# Patient Record
Sex: Male | Born: 1960 | Race: White | Hispanic: No | Smoking: Former smoker
Health system: Southern US, Community
[De-identification: ages and names within clinical notes are randomized; demographics above are authoritative.]

## PROBLEM LIST (undated history)

## (undated) DIAGNOSIS — Z973 Presence of spectacles and contact lenses: Secondary | ICD-10-CM

## (undated) DIAGNOSIS — E669 Obesity, unspecified: Secondary | ICD-10-CM

## (undated) DIAGNOSIS — Z951 Presence of aortocoronary bypass graft: Secondary | ICD-10-CM

## (undated) DIAGNOSIS — K635 Polyp of colon: Secondary | ICD-10-CM

## (undated) DIAGNOSIS — K649 Unspecified hemorrhoids: Secondary | ICD-10-CM

## (undated) DIAGNOSIS — Z7901 Long term (current) use of anticoagulants: Secondary | ICD-10-CM

## (undated) DIAGNOSIS — E1165 Type 2 diabetes mellitus with hyperglycemia: Secondary | ICD-10-CM

## (undated) DIAGNOSIS — M4322 Fusion of spine, cervical region: Secondary | ICD-10-CM

## (undated) DIAGNOSIS — Z981 Arthrodesis status: Secondary | ICD-10-CM

## (undated) DIAGNOSIS — I4510 Unspecified right bundle-branch block: Secondary | ICD-10-CM

## (undated) DIAGNOSIS — M5030 Other cervical disc degeneration, unspecified cervical region: Secondary | ICD-10-CM

## (undated) DIAGNOSIS — I4891 Unspecified atrial fibrillation: Secondary | ICD-10-CM

## (undated) DIAGNOSIS — G459 Transient cerebral ischemic attack, unspecified: Secondary | ICD-10-CM

## (undated) DIAGNOSIS — I484 Atypical atrial flutter: Secondary | ICD-10-CM

## (undated) DIAGNOSIS — E559 Vitamin D deficiency, unspecified: Secondary | ICD-10-CM

## (undated) DIAGNOSIS — G5600 Carpal tunnel syndrome, unspecified upper limb: Secondary | ICD-10-CM

## (undated) DIAGNOSIS — Q790 Congenital diaphragmatic hernia: Secondary | ICD-10-CM

## (undated) DIAGNOSIS — Z87442 Personal history of urinary calculi: Secondary | ICD-10-CM

## (undated) DIAGNOSIS — G4733 Obstructive sleep apnea (adult) (pediatric): Secondary | ICD-10-CM

## (undated) DIAGNOSIS — I2089 Other forms of angina pectoris: Secondary | ICD-10-CM

## (undated) DIAGNOSIS — Z8679 Personal history of other diseases of the circulatory system: Secondary | ICD-10-CM

## (undated) DIAGNOSIS — I2510 Atherosclerotic heart disease of native coronary artery without angina pectoris: Secondary | ICD-10-CM

## (undated) DIAGNOSIS — I10 Essential (primary) hypertension: Secondary | ICD-10-CM

## (undated) DIAGNOSIS — M1990 Unspecified osteoarthritis, unspecified site: Secondary | ICD-10-CM

## (undated) DIAGNOSIS — E119 Type 2 diabetes mellitus without complications: Secondary | ICD-10-CM

## (undated) DIAGNOSIS — Z7902 Long term (current) use of antithrombotics/antiplatelets: Secondary | ICD-10-CM

## (undated) DIAGNOSIS — T884XXA Failed or difficult intubation, initial encounter: Secondary | ICD-10-CM

## (undated) DIAGNOSIS — Z8489 Family history of other specified conditions: Secondary | ICD-10-CM

## (undated) DIAGNOSIS — I4819 Other persistent atrial fibrillation: Secondary | ICD-10-CM

## (undated) DIAGNOSIS — E785 Hyperlipidemia, unspecified: Secondary | ICD-10-CM

## (undated) DIAGNOSIS — N182 Chronic kidney disease, stage 2 (mild): Secondary | ICD-10-CM

## (undated) DIAGNOSIS — K219 Gastro-esophageal reflux disease without esophagitis: Secondary | ICD-10-CM

## (undated) DIAGNOSIS — R0600 Dyspnea, unspecified: Secondary | ICD-10-CM

## (undated) DIAGNOSIS — I219 Acute myocardial infarction, unspecified: Secondary | ICD-10-CM

## (undated) DIAGNOSIS — E1149 Type 2 diabetes mellitus with other diabetic neurological complication: Secondary | ICD-10-CM

## (undated) DIAGNOSIS — Z79899 Other long term (current) drug therapy: Secondary | ICD-10-CM

## (undated) DIAGNOSIS — I700 Atherosclerosis of aorta: Secondary | ICD-10-CM

## (undated) HISTORY — PX: SPINAL FUSION: SHX223

## (undated) HISTORY — DX: Essential (primary) hypertension: I10

## (undated) HISTORY — PX: CARDIAC CATHETERIZATION: SHX172

## (undated) HISTORY — PX: OTHER SURGICAL HISTORY: SHX169

## (undated) HISTORY — DX: Acute myocardial infarction, unspecified: I21.9

## (undated) HISTORY — DX: Atherosclerotic heart disease of native coronary artery without angina pectoris: I25.10

## (undated) HISTORY — PX: CARPAL TUNNEL RELEASE: SHX101

## (undated) HISTORY — DX: Obesity, unspecified: E66.9

## (undated) HISTORY — PX: TONSILLECTOMY: SUR1361

## (undated) HISTORY — DX: Hyperlipidemia, unspecified: E78.5

## (undated) HISTORY — DX: Dyspnea, unspecified: R06.00

---

## 2004-02-27 ENCOUNTER — Other Ambulatory Visit: Payer: Self-pay

## 2004-02-27 ENCOUNTER — Inpatient Hospital Stay: Payer: Self-pay | Admitting: Internal Medicine

## 2005-04-01 DIAGNOSIS — I2510 Atherosclerotic heart disease of native coronary artery without angina pectoris: Secondary | ICD-10-CM

## 2005-04-01 DIAGNOSIS — Z955 Presence of coronary angioplasty implant and graft: Secondary | ICD-10-CM

## 2005-04-01 DIAGNOSIS — I219 Acute myocardial infarction, unspecified: Secondary | ICD-10-CM

## 2005-04-01 HISTORY — DX: Acute myocardial infarction, unspecified: I21.9

## 2005-04-01 HISTORY — DX: Atherosclerotic heart disease of native coronary artery without angina pectoris: I25.10

## 2005-04-01 HISTORY — DX: Presence of coronary angioplasty implant and graft: Z95.5

## 2005-04-28 ENCOUNTER — Other Ambulatory Visit: Payer: Self-pay

## 2005-04-28 ENCOUNTER — Emergency Department: Payer: Self-pay | Admitting: General Practice

## 2005-04-28 DIAGNOSIS — I2119 ST elevation (STEMI) myocardial infarction involving other coronary artery of inferior wall: Secondary | ICD-10-CM

## 2005-04-28 HISTORY — DX: ST elevation (STEMI) myocardial infarction involving other coronary artery of inferior wall: I21.19

## 2005-04-28 HISTORY — PX: CORONARY ANGIOPLASTY WITH STENT PLACEMENT: SHX49

## 2005-05-03 DIAGNOSIS — I252 Old myocardial infarction: Secondary | ICD-10-CM | POA: Insufficient documentation

## 2005-05-28 ENCOUNTER — Encounter: Payer: Self-pay | Admitting: Cardiovascular Disease

## 2005-05-30 ENCOUNTER — Encounter: Payer: Self-pay | Admitting: Cardiovascular Disease

## 2005-06-30 ENCOUNTER — Encounter: Payer: Self-pay | Admitting: Cardiovascular Disease

## 2005-07-30 ENCOUNTER — Encounter: Payer: Self-pay | Admitting: Cardiovascular Disease

## 2005-12-30 DIAGNOSIS — G609 Hereditary and idiopathic neuropathy, unspecified: Secondary | ICD-10-CM | POA: Insufficient documentation

## 2006-02-07 ENCOUNTER — Other Ambulatory Visit: Payer: Self-pay

## 2006-02-14 ENCOUNTER — Ambulatory Visit: Payer: Self-pay | Admitting: Specialist

## 2006-02-20 ENCOUNTER — Inpatient Hospital Stay: Payer: Self-pay | Admitting: Specialist

## 2006-06-19 DIAGNOSIS — E785 Hyperlipidemia, unspecified: Secondary | ICD-10-CM | POA: Insufficient documentation

## 2006-06-19 DIAGNOSIS — I10 Essential (primary) hypertension: Secondary | ICD-10-CM | POA: Insufficient documentation

## 2006-07-10 ENCOUNTER — Other Ambulatory Visit: Payer: Self-pay

## 2006-07-10 ENCOUNTER — Inpatient Hospital Stay: Payer: Self-pay | Admitting: Internal Medicine

## 2006-07-10 HISTORY — PX: CORONARY ANGIOPLASTY WITH STENT PLACEMENT: SHX49

## 2006-08-13 ENCOUNTER — Encounter: Payer: Self-pay | Admitting: Cardiovascular Disease

## 2008-07-28 ENCOUNTER — Ambulatory Visit: Payer: Self-pay | Admitting: Family Medicine

## 2008-08-04 ENCOUNTER — Ambulatory Visit: Payer: Self-pay

## 2008-09-14 ENCOUNTER — Ambulatory Visit (HOSPITAL_COMMUNITY): Admission: RE | Admit: 2008-09-14 | Discharge: 2008-09-15 | Payer: Self-pay | Admitting: Neurosurgery

## 2008-09-14 HISTORY — PX: ANTERIOR FUSION CERVICAL SPINE: SUR626

## 2008-11-02 ENCOUNTER — Encounter: Admission: RE | Admit: 2008-11-02 | Discharge: 2008-11-02 | Payer: Self-pay | Admitting: Neurosurgery

## 2008-12-29 DIAGNOSIS — F19939 Other psychoactive substance use, unspecified with withdrawal, unspecified: Secondary | ICD-10-CM | POA: Insufficient documentation

## 2008-12-29 DIAGNOSIS — F19239 Other psychoactive substance dependence with withdrawal, unspecified: Secondary | ICD-10-CM | POA: Insufficient documentation

## 2009-11-27 ENCOUNTER — Ambulatory Visit: Payer: Self-pay | Admitting: Cardiovascular Disease

## 2009-11-27 HISTORY — PX: CORONARY ANGIOPLASTY WITH STENT PLACEMENT: SHX49

## 2010-07-09 LAB — DIFFERENTIAL
Basophils Absolute: 0 10*3/uL (ref 0.0–0.1)
Basophils Relative: 0 % (ref 0–1)
Lymphocytes Relative: 30 % (ref 12–46)
Monocytes Absolute: 0.6 10*3/uL (ref 0.1–1.0)
Monocytes Relative: 7 % (ref 3–12)
Neutro Abs: 4.9 10*3/uL (ref 1.7–7.7)
Neutrophils Relative %: 62 % (ref 43–77)

## 2010-07-09 LAB — COMPREHENSIVE METABOLIC PANEL
ALT: 23 U/L (ref 0–53)
Calcium: 9.2 mg/dL (ref 8.4–10.5)
GFR calc Af Amer: >60 mL/min (ref >60)
Glucose, Bld: 90 mg/dL (ref 70–99)
Sodium: 139 mEq/L (ref 135–145)
Total Bilirubin: 0.6 mg/dL (ref 0.3–1.2)
Total Protein: 6.8 g/dL (ref 6.0–8.3)

## 2010-07-09 LAB — CBC
Hemoglobin: 13.6 g/dL (ref 13.0–17.0)
MCHC: 32.8 g/dL (ref 30.0–36.0)
Platelets: 251 10*3/uL (ref 150–400)
RDW: 13.3 % (ref 11.5–15.5)

## 2010-07-09 LAB — TYPE AND SCREEN
ABO/RH(D): O NEG
Antibody Screen: NEGATIVE

## 2010-07-09 LAB — PROTIME-INR: Prothrombin Time: 12.8 seconds (ref 11.6–15.2)

## 2010-08-14 NOTE — Op Note (Signed)
NAME:  Nathan Hull, PACHOLSKI               ACCOUNT NO.:  [id number]   MEDICAL RECORD NO.:  [id number]          PATIENT TYPE:  OIB   LOCATION:  3534                         FACILITY:  [facility]   PHYSICIAN:  Kathaleen Maser. Pool, M.D.    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   PROCEDURE NAME:  C6-7 anterior cervical diskectomy and fusion,  inadvertent C5-6 anterior cervical diskectomy and fusion with C5 through  C7 anterior plate instrumentation.   SURGEON:  Kathaleen Maser. Pool, MD   ASSISTANT:  Tia Alert, MD   ANESTHESIA:  General endotracheal.   INDICATIONS:  Nathan Hull is a 50 year old male with history of severe  neck and right upper extremity pain, paresthesias and weaknesses history  with a right-sided C7 radiculopathy failing all conservative measure.  Workup demonstrates evidence of spondylosis with associated disk  herniation of the right-sided C6-7.  The patient has been counseled as  to his options and has decided to proceed with C6-7 anterior cervical  diskectomy and fusion with allograft and plating.   OPERATIVE NOTE:  The patient was taken to the operating room and placed  on the table in supine position.  After adequate level of anesthesia was  achieved, the patient was positioned supine with neck slightly extended,  held in place with 10-pounds Holter traction.  The patient's shoulders  taped down to provide better visualization to the cervical spine on x-  ray guidance.  All pressure points were appropriately padded.  The  patient's anterior neck was prepped and draped sterilely.  A 10-blade  was used make a linear skin incision overlying, what was felt to be the  C6-7 interspace.  This was carried down sharply to the  platysma.  The  platysma was then divided vertically and dissection proceeded to the  medial border of the sternomastoid muscle and carotid sheath.  Trachea  and esophagus were mobilized and retracted towards the left.  Prevertebral fascia stripped off the anterior spinal column.  Longus  coli muscle elevated bilaterally using electrocautery.  Deep self-  retaining retractor was placed.  Intraoperative fluoroscopy was used to  localize the proper level.  Localization was difficult secondary to the  patient's very large body habitus and very bulky neck.  The C5-6 level  was marked and confirmed under fluoroscopic guidance.  I redirected the  dissection one level caudally.  I incised the annulus while still  visualizing what I thought was the annulus of C5-6.  I then performed a  diskectomy by removing the disk using pituitary rongeurs, forward and  backward Karlin curettes, Kerrison rongeurs, high-speed drill all the  way down to the posterior edge.  Microscope was then brought into the  field and used throughout the remainder of diskectomy.  The remaining  aspects of the annulus and osteophytes were removed using the high-speed  drill down to the level  of the posterior longitudinal ligament.  The  posterior longitudinal ligament was then elevated and resected in  piecemeal fashion using Kerrison rongeurs.  The underlying thecal sac  was identified.  Wide central decompression was then performed  undercutting the bodies of C5 and C6.  Decompression was then proceeded  out to the edges of the neural foramen.  Wide anterior foraminotomies  were then performed along the course of the exiting C6 nerve roots  bilaterally.  There did appear to be some element of spondylosis and  disk herniation of the right-sided C6-7.  The disk space was sized and  an 8-mm Cornerstone wedge was then packed into place and recessed  approximately 1 mm from the anterior cortical margin.  A 25-mm Atlantis  anterior  plate was then placed over the C6 and C7 levels.  This was then  attached under fluoroscopic guidance and found to be solid within the  bone.  Locking screws were engaged.  Final images revealed that in fact  the operative level was in fact C5-C6 and C6-7 has been assumed based on  previous marking and dissection.  The plate instrumentation in C5-6 was  removed.  Dissection was redirected one level caudally.  Deep self-  retaining retractor was replaced.  A diskectomy of C6-7 was then  performed in a similar fashion by first removing the disk down to the  posterior annulus.  Microscope was then brought into the field.  Again,  remaining annulus was then removed using high-speed drill down the level  of posterior longitudinal ligament.  The posterior longitudinal ligament  was then elevated and resected in piecemeal using Kerrison rongeurs.  The underlying thecal sac was then identified.  Wide central  decompression was then performed undercutting the bodies of C6 and C7.  Decompression was then proceeded out to the edges of the neural foramen.  Wide anterior foraminotomies were performed along the course of exiting  C7 nerve roots bilaterally.  Wound was then copiously irrigated with  antibiotic solution.  A 9-mm Cornerstone wedge was then packed into  place, and recessed roughly 1 mm from the anterior cortical margin.  A  47-mm Atlantis anterior cervical plate was then placed over the C5, C6,  C7 levels.  This was then attached under fluoroscopic guidance using a  13-mm variable angle screws, 2 each at all 3 levels.  All 6 screws were  given final tightening and found to be solid bone.  The locking screws  were engaged at both levels.  Final images revealed good position of  bone grafts, hardware at proper operative level, and normal alignment of  spine.  The wound was then irrigated.  Hemostasis was ensured with the  bipolar electrocautery.  The wound was then closed in typical fashion.   The patient tolerated the procedure well and returned to the recovery  room postoperatively.           ______________________________  Kathaleen Maser Pool, M.D.     HAP/MEDQ  D:  09/14/2008  T:  09/15/2008  Job:  [id number]

## 2011-08-04 ENCOUNTER — Emergency Department: Payer: Self-pay | Admitting: Emergency Medicine

## 2011-08-08 ENCOUNTER — Emergency Department: Payer: Self-pay | Admitting: *Deleted

## 2011-11-13 ENCOUNTER — Ambulatory Visit: Payer: Self-pay | Admitting: Family Medicine

## 2012-07-01 ENCOUNTER — Other Ambulatory Visit: Payer: Self-pay | Admitting: Neurosurgery

## 2012-07-01 DIAGNOSIS — M5030 Other cervical disc degeneration, unspecified cervical region: Secondary | ICD-10-CM

## 2012-07-01 DIAGNOSIS — M5412 Radiculopathy, cervical region: Secondary | ICD-10-CM

## 2012-07-01 DIAGNOSIS — M47812 Spondylosis without myelopathy or radiculopathy, cervical region: Secondary | ICD-10-CM

## 2012-07-22 ENCOUNTER — Ambulatory Visit
Admission: RE | Admit: 2012-07-22 | Discharge: 2012-07-22 | Disposition: A | Discharge status: Home / Self Care | Payer: BC Managed Care – PPO | Source: Ambulatory Visit | Attending: Neurosurgery | Admitting: Neurosurgery

## 2012-07-22 DIAGNOSIS — M47812 Spondylosis without myelopathy or radiculopathy, cervical region: Secondary | ICD-10-CM

## 2012-07-22 DIAGNOSIS — M5412 Radiculopathy, cervical region: Secondary | ICD-10-CM

## 2012-07-22 DIAGNOSIS — M5030 Other cervical disc degeneration, unspecified cervical region: Secondary | ICD-10-CM

## 2013-09-16 ENCOUNTER — Ambulatory Visit: Payer: Self-pay | Admitting: Cardiovascular Disease

## 2013-09-20 ENCOUNTER — Encounter: Payer: BC Managed Care – PPO | Admitting: Cardiothoracic Surgery

## 2013-09-20 ENCOUNTER — Other Ambulatory Visit: Payer: Self-pay

## 2013-09-20 DIAGNOSIS — I25119 Atherosclerotic heart disease of native coronary artery with unspecified angina pectoris: Secondary | ICD-10-CM | POA: Insufficient documentation

## 2013-09-20 DIAGNOSIS — I2510 Atherosclerotic heart disease of native coronary artery without angina pectoris: Secondary | ICD-10-CM | POA: Insufficient documentation

## 2013-09-21 ENCOUNTER — Encounter: Payer: Self-pay | Admitting: Cardiothoracic Surgery

## 2013-09-21 ENCOUNTER — Encounter: Payer: Self-pay | Admitting: *Deleted

## 2013-09-21 ENCOUNTER — Other Ambulatory Visit: Payer: Self-pay | Admitting: *Deleted

## 2013-09-21 ENCOUNTER — Institutional Professional Consult (permissible substitution) (INDEPENDENT_AMBULATORY_CARE_PROVIDER_SITE_OTHER): Payer: BC Managed Care – PPO | Admitting: Cardiothoracic Surgery

## 2013-09-21 ENCOUNTER — Encounter (HOSPITAL_COMMUNITY): Payer: Self-pay | Admitting: Pharmacy Technician

## 2013-09-21 VITALS — BP 134/82 | HR 54 | Resp 16 | Ht 72.0 in | Wt 295.0 lb

## 2013-09-21 DIAGNOSIS — I2584 Coronary atherosclerosis due to calcified coronary lesion: Secondary | ICD-10-CM

## 2013-09-21 DIAGNOSIS — I25119 Atherosclerotic heart disease of native coronary artery with unspecified angina pectoris: Secondary | ICD-10-CM

## 2013-09-21 DIAGNOSIS — I2510 Atherosclerotic heart disease of native coronary artery without angina pectoris: Secondary | ICD-10-CM

## 2013-09-21 NOTE — Progress Notes (Signed)
PCP is Carmon Ginsberg, MD Referring Provider is Dionisio David, MD  Chief Complaint  Patient presents with  . Coronary Artery Disease    eval for CABG...cathed at [facility] 09/16/13    HPI: Patient examined, most recent coronary angiogram reviewed  53 year old obese Caucasian nondiabetic reformed smoker presents with symptoms of progressive angina mainly with exertion but occasionally at rest. He has a long history of coronary disease. He presented in 2007 with an inferior MI at [facility] and had a PCI with a stent to RCA. In 2011 he had further PCI to the RCA as well as the diagonal. He did well until recently when he developed recurrent angina with upper arm and chest pressure with exertion and sometimes at rest. A myocardial perfusion scan was abnormal. Cardiac catheterization was performed 5 days ago at [facility] by Dr. Yancey Flemings which demonstrated a ostial left main stenosis, distal RCA stenosis and a a small nondominant circumflex. A ramus intermediate had moderate to proximal disease. EF by ventriculogram appeared to be well preserved. Echocardiogram is pending as part of the preop evaluation.  Patient stopped his Plavix on the day of catheterization. He did not have any bleeding from the groin. 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  Patient surgical historyIs positive for anterior C-spine surgery by Dr. Trenton Gammon at [facility] hospital 2 years ago without anesthetic problems or bleeding problems.  The patient denies any other vascular complaints including claudication DVT TIA or varicose veins  Past Medical History  Diagnosis Date  . CAD (coronary artery disease)   . Dyspnea   . Hypertension   . Hyperlipidemia   . Obesity   . Myocardial infarct     Past Surgical History  Procedure Laterality Date  . Ptca at [facility]      04/28/2005  . C-spine disc replacement x 2      06/16/2012    Family History  Problem Relation Age of Onset  . Hypertension Mother    . Hypertension Father   . Rheumatic fever Mother     S/P MVR    Social History History  Substance Use Topics  . Smoking status: Former Smoker -- 2.00 packs/day    Types: Cigarettes    Quit date: 04/23/2005  . Smokeless tobacco: Never Used  . Alcohol Use: No    Current Outpatient Prescriptions  Medication Sig Dispense Refill  . aspirin EC 81 MG tablet Take 81 mg by mouth daily.      Marland Kitchen atorvastatin (LIPITOR) 80 MG tablet Take 80 mg by mouth daily.       Marland Kitchen lisinopril (PRINIVIL,ZESTRIL) 10 MG tablet Take 10 mg by mouth daily.       . metoprolol (LOPRESSOR) 50 MG tablet Take 50 mg by mouth 2 (two) times daily.       . ranolazine (RANEXA) 500 MG 12 hr tablet Take 500 mg by mouth 2 (two) times daily.       No current facility-administered medications for this visit.    No Known Allergies  Review of Systems  BP 134/82  Pulse 54  Resp 16  Ht 6' (1.829 m)  Wt 295 lb (133.811 kg)  BMI 40.00 kg/m2  SpO2 97% Physical Exam Gen.-obese middle-aged male anxious but in no acute distress HEENT normocephalic dentition good pupils equal Neck without JVD carotid bruit or mass Thorax without deformity or tenderness, breath sounds clear and equal Cardiac with regular rhythm no murmur or gallop  Abdomen obese soft nontender without pulsatile mass Extremities without clubbing cyanosis edema or tenderness Vascular pedal pulses nonpalpable, no evidence of venous insufficiency of the lower extremities Neuro right-hand dominant no focal motor deficit, normal gait  Diagnostic Tests: Coronary tear grams demonstrate left main ostial stenosis, moderate distal RCA stenosis and preserved LV function He appears to have adequate targets for grafting and the LAD distal RCA and ramus intermediate. Circumflex is small and nondominant  Impression: Left main stenosis with class4-accelerating unstable angina We'll schedule for multivessel CABG this week after Plavix washout  Plan: Multivessel CABG on  June 26 at [facility]. Procedure indications risks discussed with patient and his family. The patient lives alone but immediately adjacent to his parents who will help with his care after returns home following surgery

## 2013-09-22 ENCOUNTER — Ambulatory Visit (HOSPITAL_COMMUNITY)
Admission: RE | Admit: 2013-09-22 | Discharge: 2013-09-22 | Disposition: A | Discharge status: Home / Self Care | Payer: BC Managed Care – PPO | Source: Ambulatory Visit | Attending: Cardiothoracic Surgery | Admitting: Cardiothoracic Surgery

## 2013-09-22 ENCOUNTER — Encounter (HOSPITAL_COMMUNITY)
Admission: RE | Admit: 2013-09-22 | Discharge: 2013-09-22 | Disposition: A | Discharge status: Home / Self Care | Payer: BC Managed Care – PPO | Source: Ambulatory Visit | Attending: Cardiothoracic Surgery | Admitting: Cardiothoracic Surgery

## 2013-09-22 ENCOUNTER — Ambulatory Visit (HOSPITAL_COMMUNITY)
Admission: RE | Admit: 2013-09-22 | Discharge: 2013-09-22 | Disposition: A | Discharge status: Home / Self Care | Payer: BC Managed Care – PPO | Source: Ambulatory Visit | Attending: Family Medicine | Admitting: Family Medicine

## 2013-09-22 ENCOUNTER — Encounter (HOSPITAL_COMMUNITY): Payer: Self-pay

## 2013-09-22 VITALS — BP 123/75 | HR 59 | Temp 97.7°F | Resp 18 | Ht 72.0 in | Wt 298.0 lb

## 2013-09-22 DIAGNOSIS — I209 Angina pectoris, unspecified: Secondary | ICD-10-CM | POA: Insufficient documentation

## 2013-09-22 DIAGNOSIS — I25119 Atherosclerotic heart disease of native coronary artery with unspecified angina pectoris: Secondary | ICD-10-CM

## 2013-09-22 DIAGNOSIS — I2510 Atherosclerotic heart disease of native coronary artery without angina pectoris: Secondary | ICD-10-CM | POA: Insufficient documentation

## 2013-09-22 DIAGNOSIS — Z01810 Encounter for preprocedural cardiovascular examination: Secondary | ICD-10-CM

## 2013-09-22 DIAGNOSIS — I517 Cardiomegaly: Secondary | ICD-10-CM

## 2013-09-22 HISTORY — DX: Family history of other specified conditions: Z84.89

## 2013-09-22 LAB — APTT: aPTT: 33 seconds (ref 24–37)

## 2013-09-22 LAB — CBC
HCT: 41.7 % (ref 39.0–52.0)
Hemoglobin: 13.6 g/dL (ref 13.0–17.0)
MCH: 28.6 pg (ref 26.0–34.0)
MCHC: 32.6 g/dL (ref 30.0–36.0)
MCV: 87.8 fL (ref 78.0–100.0)
Platelets: 261 10*3/uL (ref 150–400)
RBC: 4.75 MIL/uL (ref 4.22–5.81)
RDW: 13 % (ref 11.5–15.5)
WBC: 8.2 10*3/uL (ref 4.0–10.5)

## 2013-09-22 LAB — TYPE AND SCREEN
ABO/RH(D): O NEG
Antibody Screen: NEGATIVE

## 2013-09-22 LAB — URINALYSIS, ROUTINE W REFLEX MICROSCOPIC
Bilirubin Urine: NEGATIVE
Glucose, UA: NEGATIVE mg/dL
Hgb urine dipstick: NEGATIVE
Ketones, ur: NEGATIVE mg/dL
Leukocytes, UA: NEGATIVE
Nitrite: NEGATIVE
Protein, ur: NEGATIVE mg/dL
Specific Gravity, Urine: 1.022 (ref 1.005–1.030)
Urobilinogen, UA: 0.2 mg/dL (ref 0.0–1.0)
pH: 6.5 (ref 5.0–8.0)

## 2013-09-22 LAB — HEMOGLOBIN A1C
Hgb A1c MFr Bld: 6.6 % — ABNORMAL HIGH (ref <5.7)
Mean Plasma Glucose: 143 mg/dL — ABNORMAL HIGH (ref <117)

## 2013-09-22 LAB — COMPREHENSIVE METABOLIC PANEL
ALT: 20 U/L (ref 0–53)
AST: 16 U/L (ref 0–37)
Albumin: 3.8 g/dL (ref 3.5–5.2)
Alkaline Phosphatase: 87 U/L (ref 39–117)
BUN: 16 mg/dL (ref 6–23)
CO2: 23 mEq/L (ref 19–32)
Calcium: 9.7 mg/dL (ref 8.4–10.5)
Chloride: 102 mEq/L (ref 96–112)
Creatinine, Ser: 0.91 mg/dL (ref 0.50–1.35)
GFR calc Af Amer: >90 mL/min (ref >90)
GFR calc non Af Amer: >90 mL/min (ref >90)
Glucose, Bld: 131 mg/dL — ABNORMAL HIGH (ref 70–99)
Potassium: 4.4 mEq/L (ref 3.7–5.3)
Sodium: 140 mEq/L (ref 137–147)
Total Bilirubin: 0.3 mg/dL (ref 0.3–1.2)
Total Protein: 7 g/dL (ref 6.0–8.3)

## 2013-09-22 LAB — BLOOD GAS, ARTERIAL
Acid-Base Excess: 1.8 mmol/L (ref 0.0–2.0)
Bicarbonate: 25.9 mEq/L — ABNORMAL HIGH (ref 20.0–24.0)
Drawn by: 344381
FIO2: 0.21 %
O2 Saturation: 97.4 %
Patient temperature: 98.6
TCO2: 27.2 mmol/L (ref 0–100)
pCO2 arterial: 41.1 mmHg (ref 35.0–45.0)
pH, Arterial: 7.416 (ref 7.350–7.450)
pO2, Arterial: 95.2 mmHg (ref 80.0–100.0)

## 2013-09-22 LAB — PROTIME-INR
INR: 1.08 (ref 0.00–1.49)
Prothrombin Time: 14 seconds (ref 11.6–15.2)

## 2013-09-22 LAB — SURGICAL PCR SCREEN
MRSA, PCR: NEGATIVE
Staphylococcus aureus: NEGATIVE

## 2013-09-22 MED ORDER — ALBUTEROL SULFATE (2.5 MG/3ML) 0.083% IN NEBU
2.5000 mg | INHALATION_SOLUTION | Freq: Once | RESPIRATORY_TRACT | Status: AC
  Administered 2013-09-22: 2.5 mg via RESPIRATORY_TRACT

## 2013-09-22 NOTE — Progress Notes (Addendum)
Had PFT's this AM.  Now going to Doppler studies and Echo.  DA Requested records from Dr. Laurelyn Sickle @ [facility]  [phone]

## 2013-09-22 NOTE — Progress Notes (Signed)
09/22/13 0911  OBSTRUCTIVE SLEEP APNEA  Have you ever been diagnosed with sleep apnea through a sleep study? No  Do you snore loudly (loud enough to be heard through closed doors)?  0  Do you often feel tired, fatigued, or sleepy during the daytime? 0  Has anyone observed you stop breathing during your sleep? 0  Do you have, or are you being treated for high blood pressure? 1  BMI more than 35 kg/m2? 1  Age over 51 years old? 1  Neck circumference greater than 40 cm/16 inches? 1  Gender: 1  Obstructive Sleep Apnea Score 5  Score 4 or greater  Results sent to PCP

## 2013-09-22 NOTE — Progress Notes (Signed)
VASCULAR LAB PRELIMINARY  PRELIMINARY  PRELIMINARY  PRELIMINARY  Pre-op Cardiac Surgery  Carotid Findings:  Bilateral:  1-39% ICA stenosis.  Vertebral artery flow is antegrade.      Upper Extremity Right Left  Brachial Pressures 131 Triphasic  131 Triphasic   Radial Waveforms Triphasic  Triphasic   Ulnar Waveforms Triphasic  Triphasic   Palmar Arch (Allen's Test) Within normal limits  Within normal limits        CESTONE, HELENE, RVT 09/22/2013, 11:07 AM

## 2013-09-22 NOTE — Pre-Procedure Instructions (Signed)
Nathan Hull  09/22/2013   Your procedure is scheduled on:  Friday, June 26th   Report to [facility] Admitting at 5:30 AM.   Call this number if you have problems the morning of surgery: [phone]   Remember:   Do not eat food or drink liquids after midnight Thursday.    Take these medicines the morning of surgery with A SIP OF WATER: Metoprolol   Do not wear jewelry - no rings or watches.  Do not wear lotions or colognes. You may NOT wear deodorant.   Men may shave face and neck.   Do not bring valuables to the hospital.  [facility] is not responsible for any belongings or valuables.               Contacts, dentures or bridgework may not be worn into surgery.  Leave suitcase in the car. After surgery it may be brought to your room.  For patients admitted to the hospital, discharge time is determined by your treatment team.    Name and phone number of your driver:    Special Instructions: "Preparing for Surgery" instruction sheet.   Please read over the following fact sheets that you were given: Pain Booklet, Coughing and Deep Breathing, Blood Transfusion Information, MRSA Information and Surgical Site Infection Prevention

## 2013-09-23 ENCOUNTER — Encounter (HOSPITAL_COMMUNITY): Payer: Self-pay | Admitting: Certified Registered Nurse Anesthetist

## 2013-09-23 MED ORDER — DEXTROSE 5 % IV SOLN
1.5000 g | INTRAVENOUS | Status: AC
  Administered 2013-09-24: .75 g via INTRAVENOUS
  Administered 2013-09-24: 1.5 g via INTRAVENOUS
  Filled 2013-09-23: qty 1.5

## 2013-09-23 MED ORDER — CEFUROXIME SODIUM 750 MG IJ SOLR
750.0000 mg | INTRAMUSCULAR | Status: DC
  Filled 2013-09-23: qty 750

## 2013-09-23 MED ORDER — DOPAMINE-DEXTROSE 3.2-5 MG/ML-% IV SOLN
2.0000 ug/kg/min | INTRAVENOUS | Status: DC
  Filled 2013-09-23: qty 250

## 2013-09-23 MED ORDER — MAGNESIUM SULFATE 50 % IJ SOLN
40.0000 meq | INTRAMUSCULAR | Status: DC
Start: 2013-09-24 — End: 2013-09-24
  Filled 2013-09-23: qty 10

## 2013-09-23 MED ORDER — NITROGLYCERIN IN D5W 200-5 MCG/ML-% IV SOLN
2.0000 ug/min | INTRAVENOUS | Status: DC
  Filled 2013-09-23: qty 250

## 2013-09-23 MED ORDER — DEXMEDETOMIDINE HCL IN NACL 400 MCG/100ML IV SOLN
0.1000 ug/kg/h | INTRAVENOUS | Status: DC
  Filled 2013-09-23: qty 100

## 2013-09-23 MED ORDER — METOPROLOL TARTRATE 12.5 MG HALF TABLET: 12.5000 mg | ORAL_TABLET | Freq: Once | ORAL | Status: DC

## 2013-09-23 MED ORDER — POTASSIUM CHLORIDE 2 MEQ/ML IV SOLN
80.0000 meq | INTRAVENOUS | Status: DC
Start: 2013-09-24 — End: 2013-09-24
  Filled 2013-09-23: qty 40

## 2013-09-23 MED ORDER — PHENYLEPHRINE HCL 10 MG/ML IJ SOLN
30.0000 ug/min | INTRAVENOUS | Status: DC
  Filled 2013-09-23: qty 2

## 2013-09-23 MED ORDER — PLASMA-LYTE 148 IV SOLN
INTRAVENOUS | Status: AC
  Administered 2013-09-24: 07:00:00
  Filled 2013-09-23: qty 2.5

## 2013-09-23 MED ORDER — SODIUM CHLORIDE 0.9 % IV SOLN
INTRAVENOUS | Status: DC
  Filled 2013-09-23 (×2): qty 40

## 2013-09-23 MED ORDER — INSULIN REGULAR HUMAN 100 UNIT/ML IJ SOLN
INTRAMUSCULAR | Status: DC
  Filled 2013-09-23: qty 1

## 2013-09-23 MED ORDER — SODIUM CHLORIDE 0.9 % IV SOLN
INTRAVENOUS | Status: DC
  Filled 2013-09-23: qty 30

## 2013-09-23 MED ORDER — VANCOMYCIN HCL 10 G IV SOLR
1500.0000 mg | INTRAVENOUS | Status: AC
  Administered 2013-09-24: 1500 mg via INTRAVENOUS
  Filled 2013-09-23 (×2): qty 1500

## 2013-09-23 MED ORDER — EPINEPHRINE HCL 1 MG/ML IJ SOLN
0.5000 ug/min | INTRAVENOUS | Status: DC
  Filled 2013-09-23: qty 4

## 2013-09-23 NOTE — Anesthesia Preprocedure Evaluation (Addendum)
Anesthesia Evaluation  Patient identified by MRN, date of birth, ID band Patient awake    Reviewed: Allergy & Precautions, H&P , NPO status , Patient's Chart, lab work & pertinent test results, reviewed documented beta blocker date and time   History of Anesthesia Complications Negative for: history of anesthetic complications  Airway Mallampati: III TM Distance: >3 FB Neck ROM: Full    Dental  (+) Teeth Intact, Dental Advisory Given, Chipped   Pulmonary shortness of breath, former smoker (quit '07),  breath sounds clear to auscultation        Cardiovascular hypertension, Pt. on medications and Pt. on home beta blockers + CAD (L main disease) and + Past MI Rhythm:Regular Rate:Normal  ECHO: EF 60-65%, valves OK   Neuro/Psych negative neurological ROS     GI/Hepatic negative GI ROS, Neg liver ROS,   Endo/Other  Morbid obesity  Renal/GU negative Renal ROS     Musculoskeletal   Abdominal (+) + obese,   Peds  Hematology   Anesthesia Other Findings   Reproductive/Obstetrics                         Anesthesia Physical Anesthesia Plan  ASA: III  Anesthesia Plan: General   Post-op Pain Management:    Induction: Intravenous  Airway Management Planned: Oral ETT  Additional Equipment: Arterial line, PA Cath, CVP, 3D TEE and Ultrasound Guidance Line Placement  Intra-op Plan:   Post-operative Plan: Post-operative intubation/ventilation  Informed Consent: I have reviewed the patients History and Physical, chart, labs and discussed the procedure including the risks, benefits and alternatives for the proposed anesthesia with the patient or authorized representative who has indicated his/her understanding and acceptance.   Dental advisory given  Plan Discussed with: CRNA and Surgeon  Anesthesia Plan Comments: (Plan routine monitors, A line, PA cath, GETA with TEE and post op ventilation)         Anesthesia Quick Evaluation

## 2013-09-24 ENCOUNTER — Inpatient Hospital Stay (HOSPITAL_COMMUNITY)
Admission: RE | Admit: 2013-09-24 | Discharge: 2013-10-01 | DRG: 236 | Disposition: A | Discharge status: Home / Self Care | Payer: BC Managed Care – PPO | Source: Ambulatory Visit | Attending: Cardiothoracic Surgery | Admitting: Cardiothoracic Surgery

## 2013-09-24 ENCOUNTER — Inpatient Hospital Stay (HOSPITAL_COMMUNITY): Payer: BC Managed Care – PPO | Admitting: Certified Registered"

## 2013-09-24 ENCOUNTER — Encounter (HOSPITAL_COMMUNITY): Payer: Self-pay | Admitting: *Deleted

## 2013-09-24 ENCOUNTER — Encounter (HOSPITAL_COMMUNITY)
Admission: RE | Disposition: A | Discharge status: Home / Self Care | Payer: BC Managed Care – PPO | Source: Ambulatory Visit | Attending: Cardiothoracic Surgery

## 2013-09-24 ENCOUNTER — Encounter (HOSPITAL_COMMUNITY): Payer: BC Managed Care – PPO | Admitting: Certified Registered"

## 2013-09-24 ENCOUNTER — Inpatient Hospital Stay (HOSPITAL_COMMUNITY): Payer: BC Managed Care – PPO

## 2013-09-24 DIAGNOSIS — Z951 Presence of aortocoronary bypass graft: Secondary | ICD-10-CM

## 2013-09-24 DIAGNOSIS — I252 Old myocardial infarction: Secondary | ICD-10-CM

## 2013-09-24 DIAGNOSIS — E119 Type 2 diabetes mellitus without complications: Secondary | ICD-10-CM | POA: Diagnosis present

## 2013-09-24 DIAGNOSIS — K5900 Constipation, unspecified: Secondary | ICD-10-CM | POA: Diagnosis present

## 2013-09-24 DIAGNOSIS — I2510 Atherosclerotic heart disease of native coronary artery without angina pectoris: Secondary | ICD-10-CM

## 2013-09-24 DIAGNOSIS — D62 Acute posthemorrhagic anemia: Secondary | ICD-10-CM | POA: Diagnosis not present

## 2013-09-24 DIAGNOSIS — E8779 Other fluid overload: Secondary | ICD-10-CM | POA: Diagnosis not present

## 2013-09-24 DIAGNOSIS — Z6841 Body Mass Index (BMI) 40.0 and over, adult: Secondary | ICD-10-CM

## 2013-09-24 DIAGNOSIS — T884XXA Failed or difficult intubation, initial encounter: Secondary | ICD-10-CM

## 2013-09-24 DIAGNOSIS — E871 Hypo-osmolality and hyponatremia: Secondary | ICD-10-CM | POA: Diagnosis not present

## 2013-09-24 DIAGNOSIS — I25119 Atherosclerotic heart disease of native coronary artery with unspecified angina pectoris: Secondary | ICD-10-CM

## 2013-09-24 DIAGNOSIS — I10 Essential (primary) hypertension: Secondary | ICD-10-CM | POA: Diagnosis present

## 2013-09-24 DIAGNOSIS — I4891 Unspecified atrial fibrillation: Secondary | ICD-10-CM

## 2013-09-24 DIAGNOSIS — Z9861 Coronary angioplasty status: Secondary | ICD-10-CM

## 2013-09-24 HISTORY — DX: Presence of aortocoronary bypass graft: Z95.1

## 2013-09-24 HISTORY — PX: CORONARY ARTERY BYPASS GRAFT: SHX141

## 2013-09-24 HISTORY — PX: INTRAOPERATIVE TRANSESOPHAGEAL ECHOCARDIOGRAM: SHX5062

## 2013-09-24 HISTORY — DX: Failed or difficult intubation, initial encounter: T88.4XXA

## 2013-09-24 LAB — POCT I-STAT 4, (NA,K, GLUC, HGB,HCT)
Glucose, Bld: 120 mg/dL — ABNORMAL HIGH (ref 70–99)
Glucose, Bld: 128 mg/dL — ABNORMAL HIGH (ref 70–99)
Glucose, Bld: 167 mg/dL — ABNORMAL HIGH (ref 70–99)
Glucose, Bld: 177 mg/dL — ABNORMAL HIGH (ref 70–99)
Glucose, Bld: 139 mg/dL — ABNORMAL HIGH (ref 70–99)
HCT: 30 % — ABNORMAL LOW (ref 39.0–52.0)
HCT: 35 % — ABNORMAL LOW (ref 39.0–52.0)
HCT: 30 % — ABNORMAL LOW (ref 39.0–52.0)
HCT: 31 % — ABNORMAL LOW (ref 39.0–52.0)
HCT: 32 % — ABNORMAL LOW (ref 39.0–52.0)
Hemoglobin: 10.2 g/dL — ABNORMAL LOW (ref 13.0–17.0)
Hemoglobin: 11.9 g/dL — ABNORMAL LOW (ref 13.0–17.0)
Hemoglobin: 10.2 g/dL — ABNORMAL LOW (ref 13.0–17.0)
Hemoglobin: 10.5 g/dL — ABNORMAL LOW (ref 13.0–17.0)
Hemoglobin: 10.9 g/dL — ABNORMAL LOW (ref 13.0–17.0)
Potassium: 4.2 mEq/L (ref 3.7–5.3)
Potassium: 4.4 mEq/L (ref 3.7–5.3)
Potassium: 4.4 mEq/L (ref 3.7–5.3)
Potassium: 4.2 mEq/L (ref 3.7–5.3)
Potassium: 4 mEq/L (ref 3.7–5.3)
Sodium: 136 mEq/L — ABNORMAL LOW (ref 137–147)
Sodium: 137 mEq/L (ref 137–147)
Sodium: 132 mEq/L — ABNORMAL LOW (ref 137–147)
Sodium: 135 mEq/L — ABNORMAL LOW (ref 137–147)
Sodium: 137 mEq/L (ref 137–147)

## 2013-09-24 LAB — POCT I-STAT, CHEM 8
BUN: 11 mg/dL (ref 6–23)
BUN: 10 mg/dL (ref 6–23)
BUN: 11 mg/dL (ref 6–23)
Calcium, Ion: 1.32 mmol/L — ABNORMAL HIGH (ref 1.12–1.23)
Calcium, Ion: 1.14 mmol/L (ref 1.12–1.23)
Calcium, Ion: 1.18 mmol/L (ref 1.12–1.23)
Chloride: 98 mEq/L (ref 96–112)
Chloride: 94 mEq/L — ABNORMAL LOW (ref 96–112)
Chloride: 102 mEq/L (ref 96–112)
Creatinine, Ser: 1 mg/dL (ref 0.50–1.35)
Creatinine, Ser: 0.8 mg/dL (ref 0.50–1.35)
Creatinine, Ser: 0.9 mg/dL (ref 0.50–1.35)
Glucose, Bld: 119 mg/dL — ABNORMAL HIGH (ref 70–99)
Glucose, Bld: 169 mg/dL — ABNORMAL HIGH (ref 70–99)
Glucose, Bld: 146 mg/dL — ABNORMAL HIGH (ref 70–99)
HCT: 40 % (ref 39.0–52.0)
HCT: 29 % — ABNORMAL LOW (ref 39.0–52.0)
HCT: 36 % — ABNORMAL LOW (ref 39.0–52.0)
Hemoglobin: 13.6 g/dL (ref 13.0–17.0)
Hemoglobin: 9.9 g/dL — ABNORMAL LOW (ref 13.0–17.0)
Hemoglobin: 12.2 g/dL — ABNORMAL LOW (ref 13.0–17.0)
Potassium: 4.4 mEq/L (ref 3.7–5.3)
Potassium: 4.5 mEq/L (ref 3.7–5.3)
Potassium: 4.2 mEq/L (ref 3.7–5.3)
Sodium: 140 mEq/L (ref 137–147)
Sodium: 134 mEq/L — ABNORMAL LOW (ref 137–147)
Sodium: 138 mEq/L (ref 137–147)
TCO2: 27 mmol/L (ref 0–100)
TCO2: 27 mmol/L (ref 0–100)
TCO2: 23 mmol/L (ref 0–100)

## 2013-09-24 LAB — PULMONARY FUNCTION TEST
DL/VA % pred: 135 %
DL/VA: 6.42 ml/min/mmHg/L
DLCO cor % pred: 114 %
DLCO cor: 40.16 ml/min/mmHg
DLCO unc % pred: 114 %
DLCO unc: 40.16 ml/min/mmHg
FEF 25-75 Post: 5.18 L/sec
FEF 25-75 Pre: 2.65 L/sec
FEF2575-%Change-Post: 95 %
FEF2575-%Pred-Post: 143 %
FEF2575-%Pred-Pre: 73 %
FEV1-%Change-Post: 19 %
FEV1-%Pred-Post: 84 %
FEV1-%Pred-Pre: 71 %
FEV1-Post: 3.51 L
FEV1-Pre: 2.95 L
FEV1FVC-%Change-Post: 3 %
FEV1FVC-%Pred-Pre: 100 %
FEV6-%Change-Post: 14 %
FEV6-%Pred-Post: 83 %
FEV6-%Pred-Pre: 73 %
FEV6-Post: 4.32 L
FEV6-Pre: 3.78 L
FEV6FVC-%Change-Post: 0 %
FEV6FVC-%Pred-Post: 103 %
FEV6FVC-%Pred-Pre: 103 %
FVC-%Change-Post: 14 %
FVC-%Pred-Post: 80 %
FVC-%Pred-Pre: 70 %
FVC-Post: 4.34 L
FVC-Pre: 3.79 L
Post FEV1/FVC ratio: 81 %
Post FEV6/FVC ratio: 99 %
Pre FEV1/FVC ratio: 78 %
Pre FEV6/FVC Ratio: 100 %
RV % pred: 102 %
RV: 2.23 L
TLC % pred: 85 %
TLC: 6.29 L

## 2013-09-24 LAB — CBC
HCT: 34.8 % — ABNORMAL LOW (ref 39.0–52.0)
HEMATOCRIT: 33.7 % — AB (ref 39.0–52.0)
Hemoglobin: 11.4 g/dL — ABNORMAL LOW (ref 13.0–17.0)
Hemoglobin: 11.8 g/dL — ABNORMAL LOW (ref 13.0–17.0)
MCH: 29.7 pg (ref 26.0–34.0)
MCH: 29.6 pg (ref 26.0–34.0)
MCHC: 33.8 g/dL (ref 30.0–36.0)
MCHC: 33.9 g/dL (ref 30.0–36.0)
MCV: 87.8 fL (ref 78.0–100.0)
MCV: 87.4 fL (ref 78.0–100.0)
Platelets: 173 10*3/uL (ref 150–400)
Platelets: 219 10*3/uL (ref 150–400)
RBC: 3.84 MIL/uL — ABNORMAL LOW (ref 4.22–5.81)
RBC: 3.98 MIL/uL — ABNORMAL LOW (ref 4.22–5.81)
RDW: 12.9 % (ref 11.5–15.5)
RDW: 12.8 % (ref 11.5–15.5)
WBC: 13.2 10*3/uL — ABNORMAL HIGH (ref 4.0–10.5)
WBC: 14.5 10*3/uL — ABNORMAL HIGH (ref 4.0–10.5)

## 2013-09-24 LAB — CREATININE, SERUM
Creatinine, Ser: 0.8 mg/dL (ref 0.50–1.35)
GFR calc Af Amer: >90 mL/min (ref >90)
GFR calc non Af Amer: >90 mL/min (ref >90)

## 2013-09-24 LAB — POCT I-STAT 3, ART BLOOD GAS (G3+)
Acid-Base Excess: 4 mmol/L — ABNORMAL HIGH (ref 0.0–2.0)
Acid-Base Excess: 1 mmol/L (ref 0.0–2.0)
Acid-base deficit: 1 mmol/L (ref 0.0–2.0)
Acid-base deficit: 1 mmol/L (ref 0.0–2.0)
Bicarbonate: 29 mEq/L — ABNORMAL HIGH (ref 20.0–24.0)
Bicarbonate: 24.4 mEq/L — ABNORMAL HIGH (ref 20.0–24.0)
Bicarbonate: 25.6 mEq/L — ABNORMAL HIGH (ref 20.0–24.0)
Bicarbonate: 27.8 mEq/L — ABNORMAL HIGH (ref 20.0–24.0)
Bicarbonate: 25.2 mEq/L — ABNORMAL HIGH (ref 20.0–24.0)
O2 Saturation: 100 %
O2 Saturation: 98 %
O2 Saturation: 92 %
O2 Saturation: 97 %
O2 Saturation: 90 %
Patient temperature: 35.7
Patient temperature: 36.7
Patient temperature: 36.9
TCO2: 30 mmol/L (ref 0–100)
TCO2: 26 mmol/L (ref 0–100)
TCO2: 27 mmol/L (ref 0–100)
TCO2: 29 mmol/L (ref 0–100)
TCO2: 27 mmol/L (ref 0–100)
pCO2 arterial: 47 mmHg — ABNORMAL HIGH (ref 35.0–45.0)
pCO2 arterial: 40.7 mmHg (ref 35.0–45.0)
pCO2 arterial: 43.6 mmHg (ref 35.0–45.0)
pCO2 arterial: 50.6 mmHg — ABNORMAL HIGH (ref 35.0–45.0)
pCO2 arterial: 47.8 mmHg — ABNORMAL HIGH (ref 35.0–45.0)
pH, Arterial: 7.399 (ref 7.350–7.450)
pH, Arterial: 7.385 (ref 7.350–7.450)
pH, Arterial: 7.37 (ref 7.350–7.450)
pH, Arterial: 7.346 — ABNORMAL LOW (ref 7.350–7.450)
pH, Arterial: 7.328 — ABNORMAL LOW (ref 7.350–7.450)
pO2, Arterial: 390 mmHg — ABNORMAL HIGH (ref 80.0–100.0)
pO2, Arterial: 112 mmHg — ABNORMAL HIGH (ref 80.0–100.0)
pO2, Arterial: 61 mmHg — ABNORMAL LOW (ref 80.0–100.0)
pO2, Arterial: 100 mmHg (ref 80.0–100.0)
pO2, Arterial: 64 mmHg — ABNORMAL LOW (ref 80.0–100.0)

## 2013-09-24 LAB — HEMOGLOBIN AND HEMATOCRIT, BLOOD
HCT: 29.3 % — ABNORMAL LOW (ref 39.0–52.0)
Hemoglobin: 9.9 g/dL — ABNORMAL LOW (ref 13.0–17.0)

## 2013-09-24 LAB — PROTIME-INR
INR: 1.36 (ref 0.00–1.49)
PROTHROMBIN TIME: 16.8 s — AB (ref 11.6–15.2)

## 2013-09-24 LAB — MAGNESIUM: Magnesium: 2.8 mg/dL — ABNORMAL HIGH (ref 1.5–2.5)

## 2013-09-24 LAB — PLATELET COUNT: Platelets: 193 10*3/uL (ref 150–400)

## 2013-09-24 LAB — APTT: aPTT: 30 seconds (ref 24–37)

## 2013-09-24 SURGERY — CORONARY ARTERY BYPASS GRAFTING (CABG)
Anesthesia: General | Site: Chest

## 2013-09-24 MED ORDER — ONDANSETRON HCL 4 MG/2ML IJ SOLN
4.0000 mg | Freq: Four times a day (QID) | INTRAMUSCULAR | Status: DC | PRN
Start: 2013-09-24 — End: 2013-10-01
  Administered 2013-09-28: 4 mg via INTRAVENOUS
  Filled 2013-09-24: qty 2

## 2013-09-24 MED ORDER — ASPIRIN 81 MG PO CHEW
324.0000 mg | CHEWABLE_TABLET | Freq: Every day | ORAL | Status: DC
  Filled 2013-09-24: qty 4

## 2013-09-24 MED ORDER — METOPROLOL TARTRATE 12.5 MG HALF TABLET
12.5000 mg | ORAL_TABLET | Freq: Two times a day (BID) | ORAL | Status: DC
  Filled 2013-09-24 (×3): qty 1

## 2013-09-24 MED ORDER — FENTANYL CITRATE 0.05 MG/ML IJ SOLN
INTRAMUSCULAR | Status: AC
  Filled 2013-09-24: qty 5

## 2013-09-24 MED ORDER — MIDAZOLAM HCL 5 MG/5ML IJ SOLN
INTRAMUSCULAR | Status: DC | PRN
  Administered 2013-09-24: 2 mg via INTRAVENOUS
  Administered 2013-09-24: 3 mg via INTRAVENOUS
  Administered 2013-09-24: 2 mg via INTRAVENOUS

## 2013-09-24 MED ORDER — PROPOFOL 10 MG/ML IV BOLUS
INTRAVENOUS | Status: AC
  Filled 2013-09-24: qty 20

## 2013-09-24 MED ORDER — DEXAMETHASONE SODIUM PHOSPHATE 10 MG/ML IJ SOLN
INTRAMUSCULAR | Status: AC
  Filled 2013-09-24: qty 1

## 2013-09-24 MED ORDER — SODIUM CHLORIDE 0.9 % IJ SOLN: 3.0000 mL | INTRAMUSCULAR | Status: DC | PRN

## 2013-09-24 MED ORDER — BISACODYL 10 MG RE SUPP: 10.0000 mg | Freq: Every day | RECTAL | Status: DC

## 2013-09-24 MED ORDER — LACTATED RINGERS IV SOLN
INTRAVENOUS | Status: DC | PRN
  Administered 2013-09-24: 07:00:00 via INTRAVENOUS

## 2013-09-24 MED ORDER — PROTAMINE SULFATE 10 MG/ML IV SOLN
INTRAVENOUS | Status: AC
  Filled 2013-09-24: qty 50

## 2013-09-24 MED ORDER — LACTATED RINGERS IV SOLN: INTRAVENOUS | Status: DC | PRN

## 2013-09-24 MED ORDER — ROCURONIUM BROMIDE 100 MG/10ML IV SOLN
INTRAVENOUS | Status: DC | PRN
  Administered 2013-09-24 (×2): 50 mg via INTRAVENOUS

## 2013-09-24 MED ORDER — NITROGLYCERIN IN D5W 200-5 MCG/ML-% IV SOLN
INTRAVENOUS | Status: DC | PRN
  Administered 2013-09-24: 16.7 ug/min via INTRAVENOUS

## 2013-09-24 MED ORDER — LACTATED RINGERS IV SOLN
INTRAVENOUS | Status: DC | PRN
  Administered 2013-09-24 (×2): via INTRAVENOUS

## 2013-09-24 MED ORDER — CHLORHEXIDINE GLUCONATE 4 % EX LIQD
30.0000 mL | CUTANEOUS | Status: DC
  Filled 2013-09-24: qty 30

## 2013-09-24 MED ORDER — LEVALBUTEROL HCL 1.25 MG/0.5ML IN NEBU
1.2500 mg | INHALATION_SOLUTION | Freq: Three times a day (TID) | RESPIRATORY_TRACT | Status: DC
  Administered 2013-09-24 – 2013-09-27 (×9): 1.25 mg via RESPIRATORY_TRACT
  Filled 2013-09-24 (×13): qty 0.5

## 2013-09-24 MED ORDER — BISACODYL 5 MG PO TBEC
10.0000 mg | DELAYED_RELEASE_TABLET | Freq: Every day | ORAL | Status: DC
  Administered 2013-09-25 – 2013-09-28 (×4): 10 mg via ORAL
  Filled 2013-09-24 (×4): qty 2

## 2013-09-24 MED ORDER — VANCOMYCIN HCL IN DEXTROSE 1-5 GM/200ML-% IV SOLN
1000.0000 mg | Freq: Once | INTRAVENOUS | Status: DC
  Filled 2013-09-24: qty 200

## 2013-09-24 MED ORDER — INSULIN REGULAR BOLUS VIA INFUSION
0.0000 [IU] | Freq: Three times a day (TID) | INTRAVENOUS | Status: DC
  Filled 2013-09-24: qty 10

## 2013-09-24 MED ORDER — LACTATED RINGERS IV SOLN: 500.0000 mL | Freq: Once | INTRAVENOUS | Status: AC | PRN

## 2013-09-24 MED ORDER — ROCURONIUM BROMIDE 50 MG/5ML IV SOLN
INTRAVENOUS | Status: AC
Start: 2013-09-24 — End: 2013-09-24
  Filled 2013-09-24: qty 1

## 2013-09-24 MED ORDER — MIDAZOLAM HCL 2 MG/2ML IJ SOLN
INTRAMUSCULAR | Status: AC
  Filled 2013-09-24: qty 2

## 2013-09-24 MED ORDER — PHENYLEPHRINE HCL 10 MG/ML IJ SOLN
10.0000 mg | INTRAVENOUS | Status: DC | PRN
  Administered 2013-09-24: 15 ug/min via INTRAVENOUS

## 2013-09-24 MED ORDER — ACETAMINOPHEN 160 MG/5ML PO SOLN
1000.0000 mg | Freq: Four times a day (QID) | ORAL | Status: AC
  Filled 2013-09-24: qty 40

## 2013-09-24 MED ORDER — ACETAMINOPHEN 500 MG PO TABS
1000.0000 mg | ORAL_TABLET | Freq: Four times a day (QID) | ORAL | Status: AC
  Administered 2013-09-25 – 2013-09-28 (×11): 1000 mg via ORAL
  Filled 2013-09-24 (×20): qty 2

## 2013-09-24 MED ORDER — HEMOSTATIC AGENTS (NO CHARGE) OPTIME
TOPICAL | Status: DC | PRN
  Administered 2013-09-24: 1 via TOPICAL

## 2013-09-24 MED ORDER — DOCUSATE SODIUM 100 MG PO CAPS
200.0000 mg | ORAL_CAPSULE | Freq: Every day | ORAL | Status: DC
  Administered 2013-09-25 – 2013-10-01 (×7): 200 mg via ORAL
  Filled 2013-09-24 (×7): qty 2

## 2013-09-24 MED ORDER — SODIUM CHLORIDE 0.9 % IV SOLN
INTRAVENOUS | Status: DC
  Administered 2013-09-24: 10 mL/h via INTRAVENOUS

## 2013-09-24 MED ORDER — PHENYLEPHRINE HCL 10 MG/ML IJ SOLN: 0.0000 ug/min | INTRAVENOUS | Status: DC

## 2013-09-24 MED ORDER — VANCOMYCIN HCL IN DEXTROSE 1-5 GM/200ML-% IV SOLN
1000.0000 mg | Freq: Two times a day (BID) | INTRAVENOUS | Status: AC
  Administered 2013-09-24 – 2013-09-25 (×2): 1000 mg via INTRAVENOUS
  Filled 2013-09-24 (×2): qty 200

## 2013-09-24 MED ORDER — INSULIN REGULAR HUMAN 100 UNIT/ML IJ SOLN
100.0000 [IU] | INTRAMUSCULAR | Status: DC | PRN
  Administered 2013-09-24: 1.8 [IU]/h via INTRAVENOUS

## 2013-09-24 MED ORDER — PROTAMINE SULFATE 10 MG/ML IV SOLN
INTRAVENOUS | Status: DC | PRN
  Administered 2013-09-24: 320 mg via INTRAVENOUS

## 2013-09-24 MED ORDER — FENTANYL CITRATE 0.05 MG/ML IJ SOLN
INTRAMUSCULAR | Status: DC | PRN
  Administered 2013-09-24: 100 ug via INTRAVENOUS
  Administered 2013-09-24 (×3): 50 ug via INTRAVENOUS
  Administered 2013-09-24: 100 ug via INTRAVENOUS
  Administered 2013-09-24 (×2): 50 ug via INTRAVENOUS
  Administered 2013-09-24: 100 ug via INTRAVENOUS
  Administered 2013-09-24: 50 ug via INTRAVENOUS
  Administered 2013-09-24: 500 ug via INTRAVENOUS
  Administered 2013-09-24 (×4): 100 ug via INTRAVENOUS
  Administered 2013-09-24 (×2): 50 ug via INTRAVENOUS
  Administered 2013-09-24: 100 ug via INTRAVENOUS
  Administered 2013-09-24: 50 ug via INTRAVENOUS
  Administered 2013-09-24: 250 ug via INTRAVENOUS

## 2013-09-24 MED ORDER — METOPROLOL TARTRATE 25 MG/10 ML ORAL SUSPENSION
12.5000 mg | Freq: Two times a day (BID) | ORAL | Status: DC
  Filled 2013-09-24 (×3): qty 5

## 2013-09-24 MED ORDER — MAGNESIUM SULFATE 4000MG/100ML IJ SOLN
4.0000 g | Freq: Once | INTRAMUSCULAR | Status: AC
  Administered 2013-09-24: 4 g via INTRAVENOUS
  Filled 2013-09-24: qty 100

## 2013-09-24 MED ORDER — DOPAMINE-DEXTROSE 3.2-5 MG/ML-% IV SOLN: 3.0000 ug/kg/min | INTRAVENOUS | Status: DC

## 2013-09-24 MED ORDER — HEPARIN SODIUM (PORCINE) 1000 UNIT/ML IJ SOLN
INTRAMUSCULAR | Status: AC
  Filled 2013-09-24: qty 1

## 2013-09-24 MED ORDER — VECURONIUM BROMIDE 10 MG IV SOLR
INTRAVENOUS | Status: DC | PRN
  Administered 2013-09-24 (×3): 5 mg via INTRAVENOUS

## 2013-09-24 MED ORDER — ATORVASTATIN CALCIUM 80 MG PO TABS
80.0000 mg | ORAL_TABLET | Freq: Every day | ORAL | Status: DC
  Administered 2013-09-25 – 2013-09-30 (×6): 80 mg via ORAL
  Filled 2013-09-24 (×7): qty 1

## 2013-09-24 MED ORDER — ALBUMIN HUMAN 5 % IV SOLN
INTRAVENOUS | Status: DC | PRN
  Administered 2013-09-24 (×2): via INTRAVENOUS

## 2013-09-24 MED ORDER — OXYCODONE HCL 5 MG PO TABS
5.0000 mg | ORAL_TABLET | ORAL | Status: DC | PRN
  Administered 2013-09-25 – 2013-09-28 (×22): 10 mg via ORAL
  Administered 2013-09-28: 5 mg via ORAL
  Administered 2013-09-28 – 2013-10-01 (×21): 10 mg via ORAL
  Filled 2013-09-24 (×17): qty 2
  Filled 2013-09-24: qty 1
  Filled 2013-09-24 (×3): qty 2
  Filled 2013-09-24: qty 1
  Filled 2013-09-24 (×7): qty 2
  Filled 2013-09-24: qty 1
  Filled 2013-09-24 (×15): qty 2

## 2013-09-24 MED ORDER — METOCLOPRAMIDE HCL 5 MG/ML IJ SOLN
10.0000 mg | Freq: Four times a day (QID) | INTRAMUSCULAR | Status: AC
  Administered 2013-09-24 – 2013-09-25 (×4): 10 mg via INTRAVENOUS
  Filled 2013-09-24 (×3): qty 2

## 2013-09-24 MED ORDER — SODIUM CHLORIDE 0.9 % IV SOLN: 250.0000 mL | INTRAVENOUS | Status: DC

## 2013-09-24 MED ORDER — POTASSIUM CHLORIDE 10 MEQ/50ML IV SOLN: 10.0000 meq | INTRAVENOUS | Status: AC

## 2013-09-24 MED ORDER — NITROGLYCERIN IN D5W 200-5 MCG/ML-% IV SOLN: 0.0000 ug/min | INTRAVENOUS | Status: DC

## 2013-09-24 MED ORDER — SUCCINYLCHOLINE CHLORIDE 20 MG/ML IJ SOLN
INTRAMUSCULAR | Status: DC | PRN
  Administered 2013-09-24: 140 mg via INTRAVENOUS

## 2013-09-24 MED ORDER — SODIUM CHLORIDE 0.9 % IV SOLN
INTRAVENOUS | Status: DC
  Administered 2013-09-25: 12.5 [IU]/h via INTRAVENOUS
  Filled 2013-09-24 (×2): qty 1

## 2013-09-24 MED ORDER — MIDAZOLAM HCL 2 MG/2ML IJ SOLN: 2.0000 mg | INTRAMUSCULAR | Status: DC | PRN

## 2013-09-24 MED ORDER — ACETAMINOPHEN 160 MG/5ML PO SOLN: 650.0000 mg | Freq: Once | ORAL | Status: AC

## 2013-09-24 MED ORDER — MORPHINE SULFATE 2 MG/ML IJ SOLN
2.0000 mg | INTRAMUSCULAR | Status: DC | PRN
  Administered 2013-09-25: 2 mg via INTRAVENOUS
  Administered 2013-09-26: 4 mg via INTRAVENOUS
  Filled 2013-09-24 (×2): qty 2
  Filled 2013-09-24: qty 1
  Filled 2013-09-24 (×2): qty 2

## 2013-09-24 MED ORDER — ACETAMINOPHEN 650 MG RE SUPP
650.0000 mg | Freq: Once | RECTAL | Status: AC
  Administered 2013-09-24: 650 mg via RECTAL

## 2013-09-24 MED ORDER — MIDAZOLAM HCL 10 MG/2ML IJ SOLN
INTRAMUSCULAR | Status: AC
  Filled 2013-09-24: qty 2

## 2013-09-24 MED ORDER — PANTOPRAZOLE SODIUM 40 MG PO TBEC
40.0000 mg | DELAYED_RELEASE_TABLET | Freq: Every day | ORAL | Status: DC
  Administered 2013-09-26 – 2013-09-29 (×4): 40 mg via ORAL
  Filled 2013-09-24 (×6): qty 1

## 2013-09-24 MED ORDER — HEPARIN SODIUM (PORCINE) 1000 UNIT/ML IJ SOLN
INTRAMUSCULAR | Status: AC
  Filled 2013-09-24: qty 2

## 2013-09-24 MED ORDER — METOPROLOL TARTRATE 1 MG/ML IV SOLN: 2.5000 mg | INTRAVENOUS | Status: DC | PRN

## 2013-09-24 MED ORDER — SODIUM CHLORIDE 0.45 % IV SOLN
INTRAVENOUS | Status: DC
  Administered 2013-09-24: 20 mL/h via INTRAVENOUS

## 2013-09-24 MED ORDER — HEPARIN SODIUM (PORCINE) 1000 UNIT/ML IJ SOLN
INTRAMUSCULAR | Status: DC | PRN
  Administered 2013-09-24: 4 mL via INTRAVENOUS
  Administered 2013-09-24: 40 mL via INTRAVENOUS

## 2013-09-24 MED ORDER — ASPIRIN EC 325 MG PO TBEC
325.0000 mg | DELAYED_RELEASE_TABLET | Freq: Every day | ORAL | Status: DC
  Administered 2013-09-25 – 2013-10-01 (×7): 325 mg via ORAL
  Filled 2013-09-24 (×7): qty 1

## 2013-09-24 MED ORDER — SUCCINYLCHOLINE CHLORIDE 20 MG/ML IJ SOLN
INTRAMUSCULAR | Status: AC
  Filled 2013-09-24: qty 1

## 2013-09-24 MED ORDER — ROCURONIUM BROMIDE 50 MG/5ML IV SOLN
INTRAVENOUS | Status: AC
  Filled 2013-09-24: qty 1

## 2013-09-24 MED ORDER — DOPAMINE-DEXTROSE 3.2-5 MG/ML-% IV SOLN
INTRAVENOUS | Status: DC | PRN
  Administered 2013-09-24: 2.5 ug/kg/min via INTRAVENOUS

## 2013-09-24 MED ORDER — ALBUMIN HUMAN 5 % IV SOLN
250.0000 mL | INTRAVENOUS | Status: AC | PRN
  Administered 2013-09-24 (×2): 250 mL via INTRAVENOUS

## 2013-09-24 MED ORDER — SODIUM CHLORIDE 0.9 % IV SOLN
10.0000 g | INTRAVENOUS | Status: DC | PRN
  Administered 2013-09-24: 5 g/h via INTRAVENOUS

## 2013-09-24 MED ORDER — DEXMEDETOMIDINE HCL IN NACL 200 MCG/50ML IV SOLN
0.1000 ug/kg/h | INTRAVENOUS | Status: DC
  Administered 2013-09-24: 0.5 ug/kg/h via INTRAVENOUS
  Filled 2013-09-24: qty 50

## 2013-09-24 MED ORDER — VECURONIUM BROMIDE 10 MG IV SOLR
INTRAVENOUS | Status: AC
  Filled 2013-09-24: qty 20

## 2013-09-24 MED ORDER — SODIUM CHLORIDE 0.9 % IJ SOLN
3.0000 mL | Freq: Two times a day (BID) | INTRAMUSCULAR | Status: DC
  Administered 2013-09-25 – 2013-09-26 (×2): 10 mL via INTRAVENOUS

## 2013-09-24 MED ORDER — 0.9 % SODIUM CHLORIDE (POUR BTL) OPTIME
TOPICAL | Status: DC | PRN
  Administered 2013-09-24: 6000 mL

## 2013-09-24 MED ORDER — LACTATED RINGERS IV SOLN
INTRAVENOUS | Status: DC
  Administered 2013-09-25: 20 mL/h via INTRAVENOUS

## 2013-09-24 MED ORDER — PROPOFOL 10 MG/ML IV BOLUS
INTRAVENOUS | Status: DC | PRN
Start: 2013-09-24 — End: 2013-09-24
  Administered 2013-09-24: 30 mg via INTRAVENOUS

## 2013-09-24 MED ORDER — BUDESONIDE-FORMOTEROL FUMARATE 160-4.5 MCG/ACT IN AERO
2.0000 | INHALATION_SPRAY | Freq: Two times a day (BID) | RESPIRATORY_TRACT | Status: DC
  Administered 2013-09-24 – 2013-09-30 (×13): 2 via RESPIRATORY_TRACT
  Filled 2013-09-24 (×2): qty 6

## 2013-09-24 MED ORDER — SODIUM CHLORIDE 0.9 % IJ SOLN
OROMUCOSAL | Status: DC | PRN
  Administered 2013-09-24 (×3): via TOPICAL

## 2013-09-24 MED ORDER — MORPHINE SULFATE 2 MG/ML IJ SOLN
1.0000 mg | INTRAMUSCULAR | Status: AC | PRN
  Administered 2013-09-24 (×3): 4 mg via INTRAVENOUS

## 2013-09-24 MED ORDER — FAMOTIDINE IN NACL 20-0.9 MG/50ML-% IV SOLN
20.0000 mg | Freq: Two times a day (BID) | INTRAVENOUS | Status: AC
Start: 2013-09-24 — End: 2013-09-24
  Administered 2013-09-24 (×2): 20 mg via INTRAVENOUS
  Filled 2013-09-24: qty 50

## 2013-09-24 MED ORDER — DEXTROSE 5 % IV SOLN
1.5000 g | Freq: Two times a day (BID) | INTRAVENOUS | Status: AC
  Administered 2013-09-24 – 2013-09-26 (×4): 1.5 g via INTRAVENOUS
  Filled 2013-09-24 (×4): qty 1.5

## 2013-09-24 MED ORDER — SODIUM CHLORIDE 0.9 % IV SOLN
200.0000 ug | INTRAVENOUS | Status: DC | PRN
  Administered 2013-09-24: .3 ug/kg/h via INTRAVENOUS

## 2013-09-24 MED ORDER — KETOROLAC TROMETHAMINE 30 MG/ML IJ SOLN
30.0000 mg | Freq: Four times a day (QID) | INTRAMUSCULAR | Status: AC
  Administered 2013-09-24 – 2013-09-25 (×2): 30 mg via INTRAVENOUS
  Filled 2013-09-24 (×2): qty 1

## 2013-09-24 SURGICAL SUPPLY — 108 items
ADAPTER CARDIO PERF ANTE/RETRO (ADAPTER) ×4 IMPLANT
ADPR PRFSN 84XANTGRD RTRGD (ADAPTER) ×2
APL SKNCLS STERI-STRIP NONHPOA (GAUZE/BANDAGES/DRESSINGS) ×2
ATTRACTOMAT 16X20 MAGNETIC DRP (DRAPES) ×4 IMPLANT
BAG DECANTER FOR FLEXI CONT (MISCELLANEOUS) ×4 IMPLANT
BANDAGE ELASTIC 4 VELCRO ST LF (GAUZE/BANDAGES/DRESSINGS) ×4 IMPLANT
BANDAGE ELASTIC 6 VELCRO ST LF (GAUZE/BANDAGES/DRESSINGS) ×4 IMPLANT
BANDAGE GAUZE ELAST BULKY 4 IN (GAUZE/BANDAGES/DRESSINGS) ×4 IMPLANT
BASKET HEART  (ORDER IN 25'S) (MISCELLANEOUS) ×1
BASKET HEART (ORDER IN 25'S) (MISCELLANEOUS) ×1
BASKET HEART (ORDER IN 25S) (MISCELLANEOUS) ×2 IMPLANT
BENZOIN TINCTURE PRP APPL 2/3 (GAUZE/BANDAGES/DRESSINGS) ×4 IMPLANT
BLADE STERNUM SYSTEM 6 (BLADE) ×4 IMPLANT
BLADE SURG 11 STRL SS (BLADE) ×2 IMPLANT
BLADE SURG 12 STRL SS (BLADE) ×4 IMPLANT
BLADE SURG ROTATE 9660 (MISCELLANEOUS) IMPLANT
BNDG GAUZE ELAST 4 BULKY (GAUZE/BANDAGES/DRESSINGS) ×4 IMPLANT
CANISTER SUCTION 2500CC (MISCELLANEOUS) ×4 IMPLANT
CANNULA ARTERIAL NVNT 3/8 22FR (MISCELLANEOUS) ×4 IMPLANT
CANNULA GUNDRY RCSP 15FR (MISCELLANEOUS) ×4 IMPLANT
CANNULA VENOUS LOW PROF 32X40 (CANNULA) IMPLANT
CARDIAC SUCTION (MISCELLANEOUS) ×4 IMPLANT
CATH CPB KIT VANTRIGT (MISCELLANEOUS) ×4 IMPLANT
CATH ROBINSON RED A/P 18FR (CATHETERS) ×12 IMPLANT
CATH THORACIC 36FR RT ANG (CATHETERS) ×4 IMPLANT
CLIP RETRACTION 3.0MM CORONARY (MISCELLANEOUS) ×2 IMPLANT
CLIP TI WIDE RED SMALL 24 (CLIP) IMPLANT
CLOSURE WOUND 1/2 X4 (GAUZE/BANDAGES/DRESSINGS) ×1
COVER SURGICAL LIGHT HANDLE (MISCELLANEOUS) ×4 IMPLANT
CRADLE DONUT ADULT HEAD (MISCELLANEOUS) ×4 IMPLANT
DRAIN CHANNEL 32F RND 10.7 FF (WOUND CARE) ×4 IMPLANT
DRAPE CARDIOVASCULAR INCISE (DRAPES) ×4
DRAPE SLUSH/WARMER DISC (DRAPES) ×4 IMPLANT
DRAPE SRG 135X102X78XABS (DRAPES) ×2 IMPLANT
DRSG AQUACEL AG ADV 3.5X14 (GAUZE/BANDAGES/DRESSINGS) ×4 IMPLANT
DRSG COVADERM 4X14 (GAUZE/BANDAGES/DRESSINGS) ×2 IMPLANT
ELECT BLADE 4.0 EZ CLEAN MEGAD (MISCELLANEOUS) ×4
ELECT BLADE 6.5 EXT (BLADE) ×4 IMPLANT
ELECT CAUTERY BLADE 6.4 (BLADE) ×4 IMPLANT
ELECT REM PT RETURN 9FT ADLT (ELECTROSURGICAL) ×8
ELECTRODE BLDE 4.0 EZ CLN MEGD (MISCELLANEOUS) ×2 IMPLANT
ELECTRODE REM PT RTRN 9FT ADLT (ELECTROSURGICAL) ×4 IMPLANT
GLOVE BIO SURGEON STRL SZ 6.5 (GLOVE) ×9 IMPLANT
GLOVE BIO SURGEON STRL SZ7.5 (GLOVE) ×12 IMPLANT
GLOVE BIO SURGEONS STRL SZ 6.5 (GLOVE) ×3
GLOVE BIOGEL PI IND STRL 7.0 (GLOVE) IMPLANT
GLOVE BIOGEL PI INDICATOR 7.0 (GLOVE) ×6
GOWN STRL REUS W/ TWL LRG LVL3 (GOWN DISPOSABLE) ×12 IMPLANT
GOWN STRL REUS W/TWL LRG LVL3 (GOWN DISPOSABLE) ×24
HEMOSTAT POWDER SURGIFOAM 1G (HEMOSTASIS) ×12 IMPLANT
HEMOSTAT SURGICEL 2X14 (HEMOSTASIS) ×4 IMPLANT
INSERT FOGARTY XLG (MISCELLANEOUS) ×2 IMPLANT
KIT BASIN OR (CUSTOM PROCEDURE TRAY) ×4 IMPLANT
KIT ROOM TURNOVER OR (KITS) ×4 IMPLANT
KIT SUCTION CATH 14FR (SUCTIONS) ×4 IMPLANT
KIT VASOVIEW W/TROCAR VH 2000 (KITS) ×4 IMPLANT
LEAD PACING MYOCARDI (MISCELLANEOUS) ×4 IMPLANT
MARKER GRAFT CORONARY BYPASS (MISCELLANEOUS) ×12 IMPLANT
NS IRRIG 1000ML POUR BTL (IV SOLUTION) ×23 IMPLANT
PACK OPEN HEART (CUSTOM PROCEDURE TRAY) ×4 IMPLANT
PAD ARMBOARD 7.5X6 YLW CONV (MISCELLANEOUS) ×8 IMPLANT
PAD ELECT DEFIB RADIOL ZOLL (MISCELLANEOUS) ×4 IMPLANT
PENCIL BUTTON HOLSTER BLD 10FT (ELECTRODE) ×4 IMPLANT
PUNCH AORTIC ROTATE 4.0MM (MISCELLANEOUS) IMPLANT
PUNCH AORTIC ROTATE 4.5MM 8IN (MISCELLANEOUS) ×2 IMPLANT
PUNCH AORTIC ROTATE 5MM 8IN (MISCELLANEOUS) IMPLANT
SET CARDIOPLEGIA MPS 5001102 (MISCELLANEOUS) ×2 IMPLANT
SPONGE GAUZE 4X4 12PLY (GAUZE/BANDAGES/DRESSINGS) ×8 IMPLANT
SPONGE GAUZE 4X4 12PLY STER LF (GAUZE/BANDAGES/DRESSINGS) ×4 IMPLANT
SPONGE LAP 18X18 X RAY DECT (DISPOSABLE) ×4 IMPLANT
SPONGE LAP 4X18 X RAY DECT (DISPOSABLE) ×2 IMPLANT
STRIP CLOSURE SKIN 1/2X4 (GAUZE/BANDAGES/DRESSINGS) ×1 IMPLANT
SURGIFLO W/THROMBIN 8M KIT (HEMOSTASIS) ×4 IMPLANT
SUT BONE WAX W31G (SUTURE) ×4 IMPLANT
SUT MNCRL AB 4-0 PS2 18 (SUTURE) ×3 IMPLANT
SUT PROLENE 3 0 SH DA (SUTURE) IMPLANT
SUT PROLENE 3 0 SH1 36 (SUTURE) ×2 IMPLANT
SUT PROLENE 4 0 RB 1 (SUTURE) ×4
SUT PROLENE 4 0 SH DA (SUTURE) ×4 IMPLANT
SUT PROLENE 4-0 RB1 .5 CRCL 36 (SUTURE) ×2 IMPLANT
SUT PROLENE 5 0 C 1 36 (SUTURE) IMPLANT
SUT PROLENE 6 0 C 1 30 (SUTURE) IMPLANT
SUT PROLENE 6 0 CC (SUTURE) ×12 IMPLANT
SUT PROLENE 8 0 BV175 6 (SUTURE) ×2 IMPLANT
SUT PROLENE BLUE 7 0 (SUTURE) ×4 IMPLANT
SUT PROLENE POLY MONO (SUTURE) ×2 IMPLANT
SUT SILK  1 MH (SUTURE)
SUT SILK 1 MH (SUTURE) IMPLANT
SUT SILK 2 0 SH CR/8 (SUTURE) ×2 IMPLANT
SUT SILK 3 0 SH CR/8 (SUTURE) IMPLANT
SUT STEEL 6MS V (SUTURE) ×8 IMPLANT
SUT STEEL SZ 6 DBL 3X14 BALL (SUTURE) ×4 IMPLANT
SUT VIC AB 1 CTX 36 (SUTURE) ×8
SUT VIC AB 1 CTX36XBRD ANBCTR (SUTURE) ×4 IMPLANT
SUT VIC AB 2-0 CT1 27 (SUTURE) ×4
SUT VIC AB 2-0 CT1 TAPERPNT 27 (SUTURE) IMPLANT
SUT VIC AB 2-0 CTX 27 (SUTURE) IMPLANT
SUT VIC AB 3-0 X1 27 (SUTURE) IMPLANT
SUTURE E-PAK OPEN HEART (SUTURE) ×4 IMPLANT
SYSTEM SAHARA CHEST DRAIN ATS (WOUND CARE) ×4 IMPLANT
TAPE CLOTH SURG 4X10 WHT LF (GAUZE/BANDAGES/DRESSINGS) ×2 IMPLANT
TAPE PAPER 2X10 WHT MICROPORE (GAUZE/BANDAGES/DRESSINGS) ×2 IMPLANT
TOWEL OR 17X24 6PK STRL BLUE (TOWEL DISPOSABLE) ×8 IMPLANT
TOWEL OR 17X26 10 PK STRL BLUE (TOWEL DISPOSABLE) ×8 IMPLANT
TRAY FOLEY IC TEMP SENS 16FR (CATHETERS) ×4 IMPLANT
TUBING INSUFFLATION 10FT LAP (TUBING) ×4 IMPLANT
UNDERPAD 30X30 INCONTINENT (UNDERPADS AND DIAPERS) ×4 IMPLANT
WATER STERILE IRR 1000ML POUR (IV SOLUTION) ×8 IMPLANT

## 2013-09-24 NOTE — Progress Notes (Signed)
The patient was examined and preop studies reviewed. There has been no change from the prior exam and the patient is ready for surgery.   Plan CABG on R Var[place] today

## 2013-09-24 NOTE — OR Nursing (Signed)
11:50 - 1st call to SICU

## 2013-09-24 NOTE — Progress Notes (Signed)
CT surgery p.m. Rounds  Extubated status post CABG x3 Patient with morbid obesity at increased risk for pulmonary complications we'll monitor carefully Hemodynamic stable minimal chest tube drainage, laboratory values reviewed

## 2013-09-24 NOTE — Procedures (Signed)
Extubation Procedure Note  Patient Details:   Name: Nathan Hull DOB: 08/03/1960 MRN: [id number]   Airway Documentation:   Pt extubated to 4L [place]. No stridor noted. BBS equal or dim. Pt able to vocalize and cough up secretions.  Evaluation  O2 sats: stable throughout and currently acceptable Complications: No apparent complications Patient did tolerate procedure well. Bilateral Breath Sounds: Diminished Suctioning: Airway   Lissa Merlin 09/24/2013, 4:38 PM

## 2013-09-24 NOTE — OR Nursing (Signed)
Dr Darcey Nora started at [phone]

## 2013-09-24 NOTE — Progress Notes (Signed)
  Echocardiogram Echocardiogram Transesophageal has been performed.  Nathan Hull 09/24/2013, 8:27 AM

## 2013-09-24 NOTE — Transfer of Care (Signed)
Immediate Anesthesia Transfer of Care Note  Patient: Nathan Hull  Procedure(s) Performed: Procedure(s): CORONARY ARTERY BYPASS GRAFTING (CABG) x three, using LIMA to LAD, and right leg greater saphenous vein harvested endoscopically - SVG to Ramus, SVG to PL (N/A) INTRAOPERATIVE TRANSESOPHAGEAL ECHOCARDIOGRAM (N/A)  Patient Location: SICU  Anesthesia Type:General  Level of Consciousness: Patient remains intubated per anesthesia plan  Airway & Oxygen Therapy: Patient remains intubated per anesthesia plan  Post-op Assessment: Report given to PACU RN  Post vital signs: Reviewed and stable  Complications: No apparent anesthesia complications

## 2013-09-24 NOTE — OR Nursing (Signed)
12:22 - 2nd call to SICU

## 2013-09-24 NOTE — Brief Op Note (Signed)
09/24/2013  11:08 AM  PATIENT:  Nathan Hull  53 y.o. male  PRE-OPERATIVE DIAGNOSIS:  CAD/LEFT MAIN DISEASE  POST-OPERATIVE DIAGNOSIS:  CAD/LEFT MAIN DISEASE  PROCEDURE:   CORONARY ARTERY BYPASS GRAFTING x 3 (LIMA-LAD, SVG-Ramus, SVG-PL) ENDOSCOPIC VEIN HARVEST RIGHT LEG  SURGEON:  [name], MD  ASSISTANT: Suzzanne Cloud, PA-C  ANESTHESIA:   general  PATIENT CONDITION:  ICU - intubated and hemodynamically stable.  PRE-OPERATIVE WEIGHT: 135 kg

## 2013-09-25 ENCOUNTER — Inpatient Hospital Stay (HOSPITAL_COMMUNITY): Payer: BC Managed Care – PPO

## 2013-09-25 LAB — BASIC METABOLIC PANEL
BUN: 13 mg/dL (ref 6–23)
CHLORIDE: 101 meq/L (ref 96–112)
CO2: 25 mEq/L (ref 19–32)
Calcium: 8 mg/dL — ABNORMAL LOW (ref 8.4–10.5)
Creatinine, Ser: 0.75 mg/dL (ref 0.50–1.35)
GFR calc Af Amer: >90 mL/min (ref >90)
GFR calc non Af Amer: >90 mL/min (ref >90)
GLUCOSE: 167 mg/dL — AB (ref 70–99)
POTASSIUM: 4.4 meq/L (ref 3.7–5.3)
SODIUM: 139 meq/L (ref 137–147)

## 2013-09-25 LAB — GLUCOSE, CAPILLARY
Glucose-Capillary: 95 mg/dL (ref 70–99)
Glucose-Capillary: 109 mg/dL — ABNORMAL HIGH (ref 70–99)
Glucose-Capillary: 110 mg/dL — ABNORMAL HIGH (ref 70–99)
Glucose-Capillary: 113 mg/dL — ABNORMAL HIGH (ref 70–99)
Glucose-Capillary: 120 mg/dL — ABNORMAL HIGH (ref 70–99)
Glucose-Capillary: 122 mg/dL — ABNORMAL HIGH (ref 70–99)
Glucose-Capillary: 122 mg/dL — ABNORMAL HIGH (ref 70–99)
Glucose-Capillary: 122 mg/dL — ABNORMAL HIGH (ref 70–99)
Glucose-Capillary: 125 mg/dL — ABNORMAL HIGH (ref 70–99)
Glucose-Capillary: 126 mg/dL — ABNORMAL HIGH (ref 70–99)
Glucose-Capillary: 131 mg/dL — ABNORMAL HIGH (ref 70–99)
Glucose-Capillary: 132 mg/dL — ABNORMAL HIGH (ref 70–99)
Glucose-Capillary: 132 mg/dL — ABNORMAL HIGH (ref 70–99)
Glucose-Capillary: 133 mg/dL — ABNORMAL HIGH (ref 70–99)
Glucose-Capillary: 134 mg/dL — ABNORMAL HIGH (ref 70–99)
Glucose-Capillary: 136 mg/dL — ABNORMAL HIGH (ref 70–99)
Glucose-Capillary: 141 mg/dL — ABNORMAL HIGH (ref 70–99)
Glucose-Capillary: 142 mg/dL — ABNORMAL HIGH (ref 70–99)
Glucose-Capillary: 142 mg/dL — ABNORMAL HIGH (ref 70–99)
Glucose-Capillary: 146 mg/dL — ABNORMAL HIGH (ref 70–99)
Glucose-Capillary: 149 mg/dL — ABNORMAL HIGH (ref 70–99)
Glucose-Capillary: 153 mg/dL — ABNORMAL HIGH (ref 70–99)
Glucose-Capillary: 163 mg/dL — ABNORMAL HIGH (ref 70–99)
Glucose-Capillary: 164 mg/dL — ABNORMAL HIGH (ref 70–99)
Glucose-Capillary: 149 mg/dL — ABNORMAL HIGH (ref 70–99)
Glucose-Capillary: 136 mg/dL — ABNORMAL HIGH (ref 70–99)

## 2013-09-25 LAB — CBC
HCT: 33.5 % — ABNORMAL LOW (ref 39.0–52.0)
HEMATOCRIT: 32.3 % — AB (ref 39.0–52.0)
HEMOGLOBIN: 10.7 g/dL — AB (ref 13.0–17.0)
HEMOGLOBIN: 11.1 g/dL — AB (ref 13.0–17.0)
MCH: 28.4 pg (ref 26.0–34.0)
MCH: 29.4 pg (ref 26.0–34.0)
MCHC: 33.1 g/dL (ref 30.0–36.0)
MCHC: 33.1 g/dL (ref 30.0–36.0)
MCV: 85.7 fL (ref 78.0–100.0)
MCV: 88.9 fL (ref 78.0–100.0)
Platelets: 206 10*3/uL (ref 150–400)
Platelets: 205 10*3/uL (ref 150–400)
RBC: 3.77 MIL/uL — AB (ref 4.22–5.81)
RBC: 3.77 MIL/uL — ABNORMAL LOW (ref 4.22–5.81)
RDW: 13.1 % (ref 11.5–15.5)
RDW: 13.6 % (ref 11.5–15.5)
WBC: 11.7 10*3/uL — AB (ref 4.0–10.5)
WBC: 13.1 10*3/uL — AB (ref 4.0–10.5)

## 2013-09-25 LAB — POCT I-STAT 3, ART BLOOD GAS (G3+)
Bicarbonate: 25.5 mEq/L — ABNORMAL HIGH (ref 20.0–24.0)
O2 Saturation: 94 %
Patient temperature: 37.1
TCO2: 27 mmol/L (ref 0–100)
pCO2 arterial: 43.8 mmHg (ref 35.0–45.0)
pH, Arterial: 7.373 (ref 7.350–7.450)
pO2, Arterial: 72 mmHg — ABNORMAL LOW (ref 80.0–100.0)

## 2013-09-25 LAB — POCT I-STAT, CHEM 8
BUN: 18 mg/dL (ref 6–23)
Calcium, Ion: 1.12 mmol/L (ref 1.12–1.23)
Chloride: 99 mEq/L (ref 96–112)
Creatinine, Ser: 1.1 mg/dL (ref 0.50–1.35)
Glucose, Bld: 150 mg/dL — ABNORMAL HIGH (ref 70–99)
HCT: 35 % — ABNORMAL LOW (ref 39.0–52.0)
Hemoglobin: 11.9 g/dL — ABNORMAL LOW (ref 13.0–17.0)
Potassium: 3.9 mEq/L (ref 3.7–5.3)
Sodium: 134 mEq/L — ABNORMAL LOW (ref 137–147)
TCO2: 26 mmol/L (ref 0–100)

## 2013-09-25 LAB — CREATININE, SERUM
CREATININE: 0.99 mg/dL (ref 0.50–1.35)
GFR calc Af Amer: >90 mL/min (ref >90)
GFR calc non Af Amer: >90 mL/min (ref >90)

## 2013-09-25 LAB — MAGNESIUM
Magnesium: 2.4 mg/dL (ref 1.5–2.5)
Magnesium: 2.2 mg/dL (ref 1.5–2.5)

## 2013-09-25 MED ORDER — INSULIN ASPART 100 UNIT/ML ~~LOC~~ SOLN
0.0000 [IU] | SUBCUTANEOUS | Status: DC
Start: 2013-09-25 — End: 2013-09-26
  Administered 2013-09-25 – 2013-09-26 (×6): 2 [IU] via SUBCUTANEOUS

## 2013-09-25 MED ORDER — INSULIN DETEMIR 100 UNIT/ML ~~LOC~~ SOLN
18.0000 [IU] | Freq: Once | SUBCUTANEOUS | Status: AC
  Administered 2013-09-25: 18 [IU] via SUBCUTANEOUS
  Filled 2013-09-25: qty 0.18

## 2013-09-25 MED ORDER — TRAMADOL HCL 50 MG PO TABS
50.0000 mg | ORAL_TABLET | Freq: Four times a day (QID) | ORAL | Status: DC | PRN
  Administered 2013-09-25 – 2013-09-29 (×8): 50 mg via ORAL
  Filled 2013-09-25 (×8): qty 1

## 2013-09-25 MED ORDER — INSULIN ASPART 100 UNIT/ML ~~LOC~~ SOLN
4.0000 [IU] | Freq: Three times a day (TID) | SUBCUTANEOUS | Status: DC
  Administered 2013-09-25 – 2013-09-26 (×3): 4 [IU] via SUBCUTANEOUS

## 2013-09-25 MED ORDER — FUROSEMIDE 10 MG/ML IJ SOLN
40.0000 mg | Freq: Two times a day (BID) | INTRAMUSCULAR | Status: DC
  Administered 2013-09-25 – 2013-09-27 (×5): 40 mg via INTRAVENOUS
  Filled 2013-09-25 (×5): qty 4

## 2013-09-25 MED ORDER — METOPROLOL TARTRATE 25 MG PO TABS
25.0000 mg | ORAL_TABLET | Freq: Two times a day (BID) | ORAL | Status: DC
  Administered 2013-09-25 – 2013-09-28 (×8): 25 mg via ORAL
  Filled 2013-09-25 (×10): qty 1

## 2013-09-25 MED ORDER — INSULIN DETEMIR 100 UNIT/ML ~~LOC~~ SOLN
18.0000 [IU] | Freq: Two times a day (BID) | SUBCUTANEOUS | Status: DC
  Administered 2013-09-25 – 2013-09-27 (×5): 18 [IU] via SUBCUTANEOUS
  Filled 2013-09-25 (×7): qty 0.18

## 2013-09-25 NOTE — Progress Notes (Signed)
1 Day Post-Op Procedure(s) (LRB): CORONARY ARTERY BYPASS GRAFTING (CABG) x three, using LIMA to LAD, and right leg greater saphenous vein harvested endoscopically - SVG to Ramus, SVG to PL (N/A) INTRAOPERATIVE TRANSESOPHAGEAL ECHOCARDIOGRAM (N/A) Subjective: Doing well first day postop CABG for severe left main and three-vessel CAD Hemodynamic stable, sinus rhythm Neuro intact Require 16 units  per hour insulin for adequate glucose control  Objective: Vital signs in last 24 hours: Temp:  [96.3 F (35.7 C)-99.3 F (37.4 C)] 97.7 F (36.5 C) (06/27 1112) Pulse Rate:  [79-104] 96 (06/27 0900) Cardiac Rhythm:  [-] Normal sinus rhythm (06/27 0730) Resp:  [11-17] 17 (06/26 1555) BP: (102-154)/(52-83) 127/68 mmHg (06/27 0900) SpO2:  [92 %-100 %] 95 % (06/27 0900) Arterial Line BP: (90-137)/(50-83) 137/64 mmHg (06/27 0900) FiO2 (%):  [40 %-50 %] 40 % (06/26 1555) Weight:  [303 lb 9.2 oz (137.7 kg)] 303 lb 9.2 oz (137.7 kg) (06/27 0500)  Hemodynamic parameters for last 24 hours: PAP: (20-38)/(11-25) 33/19 mmHg CO:  [5.7 L/min-10.7 L/min] 8.2 L/min CI:  [2.3 L/min/m2-4.2 L/min/m2] 3.2 L/min/m2  Intake/Output from previous day: 06/26 0701 - 06/27 0700 In: 6315.8 [I.V.:4715.8; Blood:400; IV Piggyback:1200] Out: 6761 [PJKDT:2671; Blood:1150; Chest Tube:1020] Intake/Output this shift: Total I/O In: 515 [I.V.:115; IV Piggyback:400] Out: 175 [Urine:85; Chest Tube:90]  Alert and responsive Breath sounds diminished Heart rhythm regular-sinus Extremities warm  Lab Results:  Recent Labs  09/24/13 1850 09/24/13 1852 09/25/13 0410  WBC 14.5*  --  11.7*  HGB 11.8* 12.2* 10.7*  HCT 34.8* 36.0* 32.3*  PLT 219  --  206   BMET:  Recent Labs  09/24/13 1852 09/25/13 0410  NA 138 139  K 4.2 4.4  CL 102 101  CO2  --  25  GLUCOSE 146* 167*  BUN 11 13  CREATININE 0.90 0.75  CALCIUM  --  8.0*    PT/INR:  Recent Labs  09/24/13 1300  LABPROT 16.8*  INR 1.36   ABG     Component Value Date/Time   PHART 7.373 09/25/2013 0440   HCO3 25.5* 09/25/2013 0440   TCO2 27 09/25/2013 0440   ACIDBASEDEF 1.0 09/24/2013 1723   O2SAT 94.0 09/25/2013 0440   CBG (last 3)   Recent Labs  09/25/13 1109  GLUCAP 95    Assessment/Plan: S/P Procedure(s) (LRB): CORONARY ARTERY BYPASS GRAFTING (CABG) x three, using LIMA to LAD, and right leg greater saphenous vein harvested endoscopically - SVG to Ramus, SVG to PL (N/A) INTRAOPERATIVE TRANSESOPHAGEAL ECHOCARDIOGRAM (N/A) See progression orders Start Levemir and transition from insulin drip  LOS: 1 day    [name],PETER 09/25/2013

## 2013-09-25 NOTE — Progress Notes (Signed)
CT surgery p.m. Rounds  Out of bed to chair, walking the hallway Maintaining sinus rhythm P.m. labs reviewed and are satisfactory Continue with twice a day Lasix diuresis

## 2013-09-25 NOTE — Op Note (Signed)
NAME:  Nathan Hull, Nathan Hull NO.:  [id number]  MEDICAL RECORD NO.:  [id number]  LOCATION:  2S16C                        FACILITY:  [facility]  PHYSICIAN:  [name], M.D.  DATE OF BIRTH:  Jun 12, 1960  DATE OF PROCEDURE:  09/24/2013 DATE OF DISCHARGE:                              OPERATIVE REPORT   OPERATION: 1. Coronary artery bypass grafting x3 (left internal mammary artery     LAD, saphenous vein graft to posterior lateral branch of the right     coronary, saphenous vein graft to ramus intermediate). 2. Endoscopic harvest right leg greater saphenous vein.  PREOPERATIVE DIAGNOSES:  Severe left main stenosis with unstable angina, three-vessel coronary artery disease.  POSTOPERATIVE DIAGNOSES:  Severe left main stenosis with unstable angina, three-vessel coronary artery disease.  SURGEON:  [name], MD  ASSISTANT:  Suzzanne Cloud, PA-C  ANESTHESIA:  General by Midge Minium, MD  INDICATIONS:  The patient is a 53 year old, morbidly obese, ex-smoker with history of coronary disease and previous stents placed in his right coronary.  He re-presented with recurrent angina and was found by cardiac catheterizations at [facility] to have a severe left main ostial stenosis as well as distal RCA stenosis.  He was referred for surgical coronary revascularization.  Prior to surgery, I examined the patient in the office and reviewed the results of the cardiac catheterization with patient and his family.  I discussed the indications, expected benefits of coronary artery bypass surgery for treatment of his coronary artery disease.  I reviewed the major aspects of the operation including the use of general anesthesia and cardiopulmonary bypass, the location of the surgical incisions, and the expected postoperative hospital recovery.  I discussed with the patient, the risks to him of coronary bypass surgery including risks of MI, stroke, bleeding,  infection, postop pleural effusion, postop arrhythmias, and death.  After reviewing these issues, he demonstrated his understanding and agreed to proceed with surgery under what I felt was an informed consent.  OPERATIVE FINDINGS: 1. Morbid obesity made exposure of the heart and mammary artery     harvest difficult. 2. No blood products required for this operation. 3. Preserved global LV function after separation of cardiopulmonary     bypass with normal 2D echo.  OPERATIVE PROCEDURE:  The patient was brought to the operating room and placed supine on the operating table, general anesthesia was induced under invasive hemodynamic monitoring.  The transesophageal echo probe was placed by the anesthesiologist.  A proper time-out was performed.  A sternal incision was made and the saphenous vein was harvested endoscopically from the right leg.  The left internal mammary artery was harvested as a pedicle graft from its origin at the subclavian vessels. There was a 1.5-mm vessel with excellent flow. The sternal retractor was placed using all deep blades.  The pericardium was opened and suspended.  Pursestrings were placed in the ascending aorta and right atrium and the patient was fully heparinized after the vein was harvested.  The ACT was documented as being therapeutic and the patient was cannulated and placed on cardiopulmonary bypass.  The coronary arteries were identified for grafting.  The LAD ramus and distal  posterolateral branch of the right coronary were found to be adequate targets for grafting.  The circumflex was small and nondominant, and too small to graft.  The mammary artery and vein grafts were prepared for the distal anastomoses.  Cardioplegic cannulas were placed for both antegrade and retrograde cold blood cardioplegia.  The patient was cooled to 32 degrees and the aortic crossclamp was applied. One liter of cold blood cardioplegia was delivered in split doses between  the antegrade aortic and retrograde coronary sinus catheters. There was good cardioplegic arrest and septal temperature dropped less than 15 degrees.  Cardioplegia was delivered every 20 minutes or less while the crossclamp was in place.  The distal coronary anastomoses were performed.  The first distal anastomosis was the distal RCA at the posterolateral.  It was 1.5-mm vessel proximal 80% stenosis.  A reverse saphenous vein was sewn end-to- side with running 7-0 Prolene.  There was good flow through the graft.  The second distal anastomosis was at the ramus intermediate branch at the left coronary.  This is 1.5-mm vessel proximal 80% stenosis.  A reverse saphenous vein was sewn end-to-side with running 7-0 Prolene with good flow through the graft.  Cardioplegia was redosed.  The third distal anastomosis was the mid portion of the LAD.  It was a 1.5-mm vessel proximal 80-90% left main stenosis.  The left IMA pedicle was brought through an opening created in the left lateral pericardium, was brought down onto the LAD and sewn end-to-side with running 8-0 Prolene.  There was good flow through the anastomosis after briefly releasing pedicle bulldog on the mammary artery.  The bulldog was reapplied and the pedicle was secured to the epicardium with 6-0 Prolene sutures.  Cardioplegia was redosed.  While the crossclamp was still in place, 2 proximal vein anastomoses were performed using a 4.5 mm punch running 6-0 Prolene.  Prior to tying down the final proximal anastomosis, air was vented from the coronaries with a dose of retrograde warm blood cardioplegia.  The crossclamp was then removed.  The heart was cardioverted back to a regular rhythm.  The vein grafts were de-aired and open, each had good flow.  Hemostasis was documented at the proximal and distal anastomoses.  The patient was rewarmed and reperfused.  Temporary pacing wires were applied.  The lungs were expanded and ventilator  was resumed.  The patient was then weaned off cardiopulmonary bypass without difficulty without significant dose of pressors.  Cardiac output and blood pressure were normal.  Echo showed good LV global function. Protamine was administered without adverse reaction.  The cannulas were removed.  The mediastinum was irrigated with warm saline.  The superior pericardial fat was closed over the aorta and vein grafts.  Anterior mediastinal and left pleural chest tube were placed and brought out through separate incisions.  The sternum was closed with interrupted steel wire.  The pectoralis fascia was closed in running #1 Vicryl.  The subcutaneous and skin layers were closed in running Vicryl and the sterile dressings were applied.  Total cardiopulmonary bypass time was 98 minutes.     [name], M.D.     PV/MEDQ  D:  09/24/2013  T:  09/25/2013  Job:  [id number]  cc:   Neoma Laming, MD

## 2013-09-26 ENCOUNTER — Inpatient Hospital Stay (HOSPITAL_COMMUNITY): Payer: BC Managed Care – PPO

## 2013-09-26 LAB — GLUCOSE, CAPILLARY
Glucose-Capillary: 138 mg/dL — ABNORMAL HIGH (ref 70–99)
Glucose-Capillary: 139 mg/dL — ABNORMAL HIGH (ref 70–99)
Glucose-Capillary: 145 mg/dL — ABNORMAL HIGH (ref 70–99)
Glucose-Capillary: 151 mg/dL — ABNORMAL HIGH (ref 70–99)
Glucose-Capillary: 155 mg/dL — ABNORMAL HIGH (ref 70–99)
Glucose-Capillary: 169 mg/dL — ABNORMAL HIGH (ref 70–99)

## 2013-09-26 LAB — CBC
HCT: 30.9 % — ABNORMAL LOW (ref 39.0–52.0)
Hemoglobin: 10.1 g/dL — ABNORMAL LOW (ref 13.0–17.0)
MCH: 28.7 pg (ref 26.0–34.0)
MCHC: 32.7 g/dL (ref 30.0–36.0)
MCV: 87.8 fL (ref 78.0–100.0)
Platelets: 210 10*3/uL (ref 150–400)
RBC: 3.52 MIL/uL — ABNORMAL LOW (ref 4.22–5.81)
RDW: 13.7 % (ref 11.5–15.5)
WBC: 14.2 10*3/uL — ABNORMAL HIGH (ref 4.0–10.5)

## 2013-09-26 LAB — BASIC METABOLIC PANEL
BUN: 23 mg/dL (ref 6–23)
CO2: 28 mEq/L (ref 19–32)
Calcium: 8.6 mg/dL (ref 8.4–10.5)
Chloride: 93 mEq/L — ABNORMAL LOW (ref 96–112)
Creatinine, Ser: 1.23 mg/dL (ref 0.50–1.35)
GFR calc Af Amer: 77 mL/min — ABNORMAL LOW (ref >90)
GFR calc non Af Amer: 66 mL/min — ABNORMAL LOW (ref >90)
Glucose, Bld: 151 mg/dL — ABNORMAL HIGH (ref 70–99)
Potassium: 4.5 mEq/L (ref 3.7–5.3)
Sodium: 134 mEq/L — ABNORMAL LOW (ref 137–147)

## 2013-09-26 MED ORDER — AMIODARONE HCL IN DEXTROSE 360-4.14 MG/200ML-% IV SOLN
30.0000 mg/h | INTRAVENOUS | Status: DC
  Administered 2013-09-27 (×2): 30 mg/h via INTRAVENOUS
  Filled 2013-09-26 (×7): qty 200

## 2013-09-26 MED ORDER — AMIODARONE LOAD VIA INFUSION
150.0000 mg | Freq: Once | INTRAVENOUS | Status: AC
Start: 2013-09-26 — End: 2013-09-26
  Administered 2013-09-26: 150 mg via INTRAVENOUS
  Filled 2013-09-26: qty 83.34

## 2013-09-26 MED ORDER — SODIUM CHLORIDE 0.9 % IJ SOLN
3.0000 mL | Freq: Two times a day (BID) | INTRAMUSCULAR | Status: DC
  Administered 2013-09-29 – 2013-09-30 (×2): 3 mL via INTRAVENOUS

## 2013-09-26 MED ORDER — SODIUM CHLORIDE 0.9 % IV SOLN: 250.0000 mL | INTRAVENOUS | Status: DC | PRN

## 2013-09-26 MED ORDER — AMIODARONE HCL IN DEXTROSE 360-4.14 MG/200ML-% IV SOLN
60.0000 mg/h | INTRAVENOUS | Status: AC
  Administered 2013-09-27: 60 mg/h via INTRAVENOUS
  Filled 2013-09-26 (×2): qty 200

## 2013-09-26 MED ORDER — SODIUM CHLORIDE 0.9 % IJ SOLN
10.0000 mL | INTRAMUSCULAR | Status: DC | PRN
  Administered 2013-09-26: 20 mL
  Administered 2013-09-27 – 2013-09-30 (×2): 10 mL

## 2013-09-26 MED ORDER — MAGNESIUM HYDROXIDE 400 MG/5ML PO SUSP
30.0000 mL | Freq: Every day | ORAL | Status: DC | PRN
Start: 2013-09-26 — End: 2013-10-01
  Administered 2013-09-29 – 2013-09-30 (×2): 30 mL via ORAL
  Filled 2013-09-26 (×3): qty 30

## 2013-09-26 MED ORDER — SODIUM CHLORIDE 0.9 % IJ SOLN: 3.0000 mL | INTRAMUSCULAR | Status: DC | PRN

## 2013-09-26 MED ORDER — MOVING RIGHT ALONG BOOK
Freq: Once | Status: AC
Start: 2013-09-26 — End: 2013-09-26
  Administered 2013-09-26: 23:00:00
  Filled 2013-09-26: qty 1

## 2013-09-26 MED ORDER — INSULIN ASPART 100 UNIT/ML ~~LOC~~ SOLN
0.0000 [IU] | Freq: Three times a day (TID) | SUBCUTANEOUS | Status: DC
  Administered 2013-09-27 – 2013-09-29 (×3): 2 [IU] via SUBCUTANEOUS

## 2013-09-26 NOTE — Progress Notes (Signed)
Pt developed atrial fib at [place]. HR 110's to 120's with brief bursts into 140's. BP 106/62 and pt admitted to "a little" lightheadedness. He denied sob. 2 pages placed to Dr. Prescott Gum with no return. Answering service called and was able to activated paging system with prompt return of call. Dr. Nils Pyle notified of change in heart rhythm and VS. Orders received. Amiodarone IV initiated with bolus following by drip. Pt rhythm is still atrial fib with rate 100-110. Pt states that he feels better.

## 2013-09-26 NOTE — Progress Notes (Signed)
2 Days Post-Op Procedure(s) (LRB): CORONARY ARTERY BYPASS GRAFTING (CABG) x three, using LIMA to LAD, and right leg greater saphenous vein harvested endoscopically - SVG to Ramus, SVG to PL (N/A) INTRAOPERATIVE TRANSESOPHAGEAL ECHOCARDIOGRAM (N/A) Subjective: Patient ambulating in the hallway Maintaining sinus rhythm Surgical musculoskeletal pain is present but improved Chest x-ray shows consistent atelectasis left lung Blood sugars controlled with sliding scale plus Lantus  Objective: Vital signs in last 24 hours: Temp:  [97.7 F (36.5 C)-99.8 F (37.7 C)] 97.7 F (36.5 C) (06/28 0756) Pulse Rate:  [82-109] 109 (06/27 1945) Cardiac Rhythm:  [-] Sinus tachycardia (06/28 0800) Resp:  [13-29] 21 (06/28 0900) BP: (114-150)/(62-95) 140/92 mmHg (06/28 0900) SpO2:  [91 %-100 %] 92 % (06/28 0900) Arterial Line BP: (126-151)/(66-76) 126/66 mmHg (06/27 1400) Weight:  [310 lb (140.615 kg)] 310 lb (140.615 kg) (06/28 0500)  Hemodynamic parameters for last 24 hours:   sinus rhythm  Intake/Output from previous day: 06/27 0701 - 06/28 0700 In: 2068.7 [P.O.:1080; I.V.:488.7; IV Piggyback:500] Out: 2330 [Urine:1590; Chest Tube:740] Intake/Output this shift: Total I/O In: 500 [P.O.:500] Out: 80 [Urine:80]  Alert responsive Mild edema Breath sounds diminished Abdomen soft Sinus rhythm  Lab Results:  Recent Labs  09/25/13 1720 09/25/13 1723 09/26/13 0450  WBC 13.1*  --  14.2*  HGB 11.1* 11.9* 10.1*  HCT 33.5* 35.0* 30.9*  PLT 205  --  210   BMET:  Recent Labs  09/25/13 0410  09/25/13 1723 09/26/13 0450  NA 139  --  134* 134*  K 4.4  --  3.9 4.5  CL 101  --  99 93*  CO2 25  --   --  28  GLUCOSE 167*  --  150* 151*  BUN 13  --  18 23  CREATININE 0.75  < > 1.10 1.23  CALCIUM 8.0*  --   --  8.6  < > = values in this interval not displayed.  PT/INR:  Recent Labs  09/24/13 1300  LABPROT 16.8*  INR 1.36   ABG    Component Value Date/Time   PHART 7.373 09/25/2013  0440   HCO3 25.5* 09/25/2013 0440   TCO2 26 09/25/2013 1723   ACIDBASEDEF 1.0 09/24/2013 1723   O2SAT 94.0 09/25/2013 0440   CBG (last 3)   Recent Labs  09/26/13 0002 09/26/13 0400 09/26/13 0754  GLUCAP 155* 145* 151*    Assessment/Plan: S/P Procedure(s) (LRB): CORONARY ARTERY BYPASS GRAFTING (CABG) x three, using LIMA to LAD, and right leg greater saphenous vein harvested endoscopically - SVG to Ramus, SVG to PL (N/A) INTRAOPERATIVE TRANSESOPHAGEAL ECHOCARDIOGRAM (N/A) Plan for transfer to step-down: see transfer orders Continue IV Lasix for the next 48 hours  LOS: 2 days    [name],PETER 09/26/2013

## 2013-09-27 ENCOUNTER — Inpatient Hospital Stay (HOSPITAL_COMMUNITY): Payer: BC Managed Care – PPO

## 2013-09-27 LAB — GLUCOSE, CAPILLARY
Glucose-Capillary: 150 mg/dL — ABNORMAL HIGH (ref 70–99)
Glucose-Capillary: 125 mg/dL — ABNORMAL HIGH (ref 70–99)
Glucose-Capillary: 110 mg/dL — ABNORMAL HIGH (ref 70–99)
Glucose-Capillary: 106 mg/dL — ABNORMAL HIGH (ref 70–99)

## 2013-09-27 LAB — BASIC METABOLIC PANEL
BUN: 24 mg/dL — ABNORMAL HIGH (ref 6–23)
CO2: 29 mEq/L (ref 19–32)
Calcium: 8.5 mg/dL (ref 8.4–10.5)
Chloride: 91 mEq/L — ABNORMAL LOW (ref 96–112)
Creatinine, Ser: 0.95 mg/dL (ref 0.50–1.35)
GFR calc Af Amer: >90 mL/min (ref >90)
GFR calc non Af Amer: >90 mL/min (ref >90)
Glucose, Bld: 158 mg/dL — ABNORMAL HIGH (ref 70–99)
Potassium: 4.2 mEq/L (ref 3.7–5.3)
Sodium: 132 mEq/L — ABNORMAL LOW (ref 137–147)

## 2013-09-27 LAB — CBC
HCT: 28.2 % — ABNORMAL LOW (ref 39.0–52.0)
Hemoglobin: 9.2 g/dL — ABNORMAL LOW (ref 13.0–17.0)
MCH: 28.8 pg (ref 26.0–34.0)
MCHC: 32.6 g/dL (ref 30.0–36.0)
MCV: 88.4 fL (ref 78.0–100.0)
Platelets: 208 10*3/uL (ref 150–400)
RBC: 3.19 MIL/uL — ABNORMAL LOW (ref 4.22–5.81)
RDW: 13.6 % (ref 11.5–15.5)
WBC: 10.4 10*3/uL (ref 4.0–10.5)

## 2013-09-27 MED ORDER — LACTULOSE 10 GM/15ML PO SOLN
20.0000 g | Freq: Every day | ORAL | Status: AC
  Administered 2013-09-27: 20 g via ORAL
  Filled 2013-09-27: qty 30

## 2013-09-27 MED ORDER — ALUM & MAG HYDROXIDE-SIMETH 200-200-20 MG/5ML PO SUSP
30.0000 mL | ORAL | Status: DC | PRN
  Administered 2013-09-27 – 2013-09-30 (×2): 30 mL via ORAL
  Filled 2013-09-27 (×2): qty 30

## 2013-09-27 MED ORDER — LACTULOSE 10 GM/15ML PO SOLN
20.0000 g | Freq: Every day | ORAL | Status: DC | PRN
  Filled 2013-09-27: qty 30

## 2013-09-27 MED ORDER — LIVING WELL WITH DIABETES BOOK
Freq: Once | Status: AC
  Administered 2013-09-27: 11:00:00
  Filled 2013-09-27: qty 1

## 2013-09-27 MED ORDER — FUROSEMIDE 10 MG/ML IJ SOLN
40.0000 mg | Freq: Every day | INTRAMUSCULAR | Status: DC
  Administered 2013-09-28 – 2013-09-30 (×3): 40 mg via INTRAVENOUS
  Filled 2013-09-27 (×4): qty 4

## 2013-09-27 MED ORDER — CYCLOBENZAPRINE HCL 10 MG PO TABS
10.0000 mg | ORAL_TABLET | Freq: Two times a day (BID) | ORAL | Status: DC | PRN
Start: 2013-09-27 — End: 2013-10-01
  Administered 2013-09-29: 10 mg via ORAL
  Filled 2013-09-27: qty 1

## 2013-09-27 MED ORDER — AMIODARONE IV BOLUS ONLY 150 MG/100ML
150.0000 mg | Freq: Once | INTRAVENOUS | Status: AC
  Administered 2013-09-27: 150 mg via INTRAVENOUS
  Filled 2013-09-27: qty 100

## 2013-09-27 MED FILL — Lidocaine HCl IV Inj 20 MG/ML: INTRAVENOUS | Qty: 5 | Status: AC

## 2013-09-27 MED FILL — Mannitol IV Soln 20%: INTRAVENOUS | Qty: 500 | Status: AC

## 2013-09-27 MED FILL — Heparin Sodium (Porcine) Inj 1000 Unit/ML: INTRAMUSCULAR | Qty: 10 | Status: AC

## 2013-09-27 MED FILL — Magnesium Sulfate Inj 50%: INTRAMUSCULAR | Qty: 10 | Status: AC

## 2013-09-27 MED FILL — Electrolyte-R (PH 7.4) Solution: INTRAVENOUS | Qty: 4000 | Status: AC

## 2013-09-27 MED FILL — Dexmedetomidine HCl IV Soln 200 MCG/2ML: INTRAVENOUS | Qty: 2 | Status: AC

## 2013-09-27 MED FILL — Potassium Chloride Inj 2 mEq/ML: INTRAVENOUS | Qty: 40 | Status: AC

## 2013-09-27 MED FILL — Sodium Chloride IV Soln 0.9%: INTRAVENOUS | Qty: 2000 | Status: AC

## 2013-09-27 MED FILL — Sodium Bicarbonate IV Soln 8.4%: INTRAVENOUS | Qty: 50 | Status: AC

## 2013-09-27 MED FILL — Heparin Sodium (Porcine) Inj 1000 Unit/ML: INTRAMUSCULAR | Qty: 30 | Status: AC

## 2013-09-27 NOTE — Progress Notes (Signed)
Pt ambulated in hallway on room air 250 ft; 96% on RA while ambulating; back in room pt went to 90-91%; put pt on 1L; encouraged patient to continue practice deep breathing; family at bedside; call bell within reach; will continue to monitor.  Rowe Pavy, RN

## 2013-09-27 NOTE — Progress Notes (Addendum)
      [address]       [place] [address]             [phone]      3 Days Post-Op Procedure(s) (LRB): CORONARY ARTERY BYPASS GRAFTING (CABG) x three, using LIMA to LAD, and right leg greater saphenous vein harvested endoscopically - SVG to Ramus, SVG to PL (N/A) INTRAOPERATIVE TRANSESOPHAGEAL ECHOCARDIOGRAM (N/A)  Subjective:  Nathan Hull states he feels bad this morning.  He thinks its mostly related to his heart rate.  He also complains of indigestion and asks for additional relief.  + ambulation,  No BM  Objective: Vital signs in last 24 hours: Temp:  [97.4 F (36.3 C)-99.1 F (37.3 C)] 98.3 F (36.8 C) (06/29 0415) Pulse Rate:  [99-135] 101 (06/29 0415) Cardiac Rhythm:  [-] Atrial fibrillation (06/29 0205) Resp:  [13-28] 18 (06/29 0415) BP: (106-140)/(56-92) 117/67 mmHg (06/29 0415) SpO2:  [92 %-97 %] 97 % (06/29 0415) Weight:  [305 lb 12.8 oz (138.71 kg)] 305 lb 12.8 oz (138.71 kg) (06/29 0415)  Intake/Output from previous day: 06/28 0701 - 06/29 0700 In: 1396.3 [P.O.:980; I.V.:366.3; IV Piggyback:50] Out: 2475 [Urine:2275; Chest Tube:200]  General appearance: alert, cooperative and no distress Heart: irregularly irregular rhythm Lungs: clear to auscultation bilaterally Abdomen: soft, non-tender; bowel sounds normal; no masses,  no organomegaly Extremities: edema 1+ Wound: clean and dry  Lab Results:  Recent Labs  09/26/13 0450 09/27/13 0500  WBC 14.2* 10.4  HGB 10.1* 9.2*  HCT 30.9* 28.2*  PLT 210 208   BMET:  Recent Labs  09/26/13 0450 09/27/13 0500  NA 134* 132*  K 4.5 4.2  CL 93* 91*  CO2 28 29  GLUCOSE 151* 158*  BUN 23 24*  CREATININE 1.23 0.95  CALCIUM 8.6 8.5    PT/INR:  Recent Labs  09/24/13 1300  LABPROT 16.8*  INR 1.36   ABG    Component Value Date/Time   PHART 7.373 09/25/2013 0440   HCO3 25.5* 09/25/2013 0440   TCO2 26 09/25/2013 1723   ACIDBASEDEF 1.0 09/24/2013 1723   O2SAT 94.0 09/25/2013 0440    CBG (last 3)   Recent Labs  09/26/13 1538 09/26/13 2053 09/27/13 0631  GLUCAP 139* 138* 150*    Assessment/Plan: S/P Procedure(s) (LRB): CORONARY ARTERY BYPASS GRAFTING (CABG) x three, using LIMA to LAD, and right leg greater saphenous vein harvested endoscopically - SVG to Ramus, SVG to PL (N/A) INTRAOPERATIVE TRANSESOPHAGEAL ECHOCARDIOGRAM (N/A)  1. CV- Atrial Fibrillation, on IV Amiodarone rate 90s-100s- will rebolus Amiodarone, Titrate Lopressor as tolerated 2. Pulm- wean oxygen as tolerated, encouarged IS 3. Chest tube- 200 cc output yesterday, will leave in place today 4. Renal- creatinine WNL, remains volume overloaded, continue IV Lasix 5. DM- sugars are well controlled, new diagnosis, consult diabetes coordinator, likely start patient on Metformin rather than insulin at discharge 6. Dispo- patient with A. Fib, will rebolus to see if patient converts, titrate Lopressor as tolerated, continue current care  LOS: 3 days    Nathan Hull, ERIN 09/27/2013  Patient will need diabetic teaching and glucometer, etc at discharge Cont IV amiodarone for now CXR shows improved aeration L base patient examined and medical record reviewed,agree with above note. Nathan Hull,Nathan Hull 09/27/2013

## 2013-09-27 NOTE — Progress Notes (Signed)
DC CT per MD orders and protocol; pt resting in bed; call bell within reach; will continue to monitor.  Carollee Sires, RN

## 2013-09-27 NOTE — Progress Notes (Signed)
CARDIAC REHAB PHASE I   PRE:  Rate/Rhythm: 96 afib  BP:  Supine:   Sitting: 116/68  Standing:    SaO2: 96%2L  MODE:  Ambulation: 350 ft   POST:  Rate/Rhythm: 131 afib, 89 afib with rest  BP:  Supine:   Sitting: 132/74  Standing:    SaO2: 94%2L 0952-1035 Pt walked 350 ft on 2L with rolling walker and asst x 1 with slow steady gait. Pt very DOE. Encouraged pursed lip breathing several times during walk. Resp rapid and shallow at times. Did not try to wean oxygen due to appearance of SOB. Stopped a couple of times to catch his breath.  Heart rate to 131. To recliner after walk. Left on oxygen. Encouraged two more walks with staff today. Family in room.   Graylon Good, RN BSN  09/27/2013 10:31 AM

## 2013-09-28 ENCOUNTER — Encounter (HOSPITAL_COMMUNITY): Payer: Self-pay | Admitting: Cardiothoracic Surgery

## 2013-09-28 LAB — GLUCOSE, CAPILLARY
Glucose-Capillary: 98 mg/dL (ref 70–99)
Glucose-Capillary: 115 mg/dL — ABNORMAL HIGH (ref 70–99)
Glucose-Capillary: 116 mg/dL — ABNORMAL HIGH (ref 70–99)
Glucose-Capillary: 114 mg/dL — ABNORMAL HIGH (ref 70–99)

## 2013-09-28 LAB — BASIC METABOLIC PANEL
BUN: 19 mg/dL (ref 6–23)
CO2: 30 mEq/L (ref 19–32)
Calcium: 8.6 mg/dL (ref 8.4–10.5)
Chloride: 92 mEq/L — ABNORMAL LOW (ref 96–112)
Creatinine, Ser: 0.89 mg/dL (ref 0.50–1.35)
GFR calc Af Amer: >90 mL/min (ref >90)
GFR calc non Af Amer: >90 mL/min (ref >90)
Glucose, Bld: 114 mg/dL — ABNORMAL HIGH (ref 70–99)
Potassium: 4.1 mEq/L (ref 3.7–5.3)
Sodium: 134 mEq/L — ABNORMAL LOW (ref 137–147)

## 2013-09-28 LAB — CBC
HCT: 26.5 % — ABNORMAL LOW (ref 39.0–52.0)
Hemoglobin: 8.8 g/dL — ABNORMAL LOW (ref 13.0–17.0)
MCH: 29.3 pg (ref 26.0–34.0)
MCHC: 33.2 g/dL (ref 30.0–36.0)
MCV: 88.3 fL (ref 78.0–100.0)
Platelets: 237 10*3/uL (ref 150–400)
RBC: 3 MIL/uL — ABNORMAL LOW (ref 4.22–5.81)
RDW: 13.6 % (ref 11.5–15.5)
WBC: 8.8 10*3/uL (ref 4.0–10.5)

## 2013-09-28 MED ORDER — FE FUMARATE-B12-VIT C-FA-IFC PO CAPS
1.0000 | ORAL_CAPSULE | Freq: Two times a day (BID) | ORAL | Status: DC
  Administered 2013-09-28 – 2013-10-01 (×7): 1 via ORAL
  Filled 2013-09-28 (×9): qty 1

## 2013-09-28 MED ORDER — AMIODARONE IV BOLUS ONLY 150 MG/100ML
150.0000 mg | Freq: Once | INTRAVENOUS | Status: AC
  Administered 2013-09-28: 150 mg via INTRAVENOUS
  Filled 2013-09-28: qty 100

## 2013-09-28 MED ORDER — AMIODARONE HCL 200 MG PO TABS
400.0000 mg | ORAL_TABLET | Freq: Two times a day (BID) | ORAL | Status: DC
  Administered 2013-09-28 – 2013-10-01 (×7): 400 mg via ORAL
  Filled 2013-09-28 (×8): qty 2

## 2013-09-28 MED ORDER — LACTULOSE 10 GM/15ML PO SOLN
20.0000 g | Freq: Once | ORAL | Status: AC
  Administered 2013-09-28: 20 g via ORAL
  Filled 2013-09-28: qty 30

## 2013-09-28 MED ORDER — METFORMIN HCL 500 MG PO TABS
500.0000 mg | ORAL_TABLET | Freq: Two times a day (BID) | ORAL | Status: DC
  Administered 2013-09-28 – 2013-10-01 (×6): 500 mg via ORAL
  Filled 2013-09-28 (×9): qty 1

## 2013-09-28 MED ORDER — POTASSIUM CHLORIDE CRYS ER 20 MEQ PO TBCR
20.0000 meq | EXTENDED_RELEASE_TABLET | Freq: Every day | ORAL | Status: DC
  Administered 2013-09-28 – 2013-10-01 (×4): 20 meq via ORAL
  Filled 2013-09-28 (×4): qty 1

## 2013-09-28 MED ORDER — FOLIC ACID 1 MG PO TABS: 1.0000 mg | ORAL_TABLET | Freq: Every day | ORAL | Status: DC

## 2013-09-28 NOTE — Progress Notes (Signed)
Inpatient Diabetes Program Recommendations  AACE/ADA: New Consensus Statement on Inpatient Glycemic Control (2013)  Target Ranges:  Prepandial:   less than 140 mg/dL      Peak postprandial:   less than 180 mg/dL (1-2 hours)      Critically ill patients:  140 - 180 mg/dL   Reason for Visit: consult for new-onset DM Late entry: visited pt 6/29 at approximately 2pm.   Diabetes history: pt reports having "prediabetes" and trying to use lifestyle modification to prevent DM   Inpatient Diabetes Program Recommendations HgbA1C: =6.6  This coordinator spoke with patient concerning A1C 6.6 and new diagnosis of DM.  Patient reports having lost weight and watching what he eats when he was told he had prediabetes.  Patient reports trying to eat low carb but eventually stopped.  Discussed carb modified diet and gave patient Diabetes Meal Planning Guide.  Recommend patient obtain glucose meter and monitor glucose at least before breakfast and at HS.  If not contraindicated, would recommend adding Metformin.  No questions/concerns at the end of our conversation.   MD-please give patient RX for glucose meter, strips, and lancets at discharge.   Thank you  Raoul Pitch BSN, RN,CDE Inpatient Diabetes Coordinator [phone] (team pager)

## 2013-09-28 NOTE — Progress Notes (Signed)
Assisted pt back to bed from bathroom. Pt with vague complaints of chest pressure around diaphragm area and some nausea. Zofran given. 12 Lead EKG obtained and AFIb confirmed. VSS. On call MD paged and orders received to give PM dose of Metoprolol early. Will carry out orders and continue to monitor pt closely.

## 2013-09-28 NOTE — Progress Notes (Addendum)
      [address]       [place] [address]             [phone]        4 Days Post-Op Procedure(s) (LRB): CORONARY ARTERY BYPASS GRAFTING (CABG) x three, using LIMA to LAD, and right leg greater saphenous vein harvested endoscopically - SVG to Ramus, SVG to PL (N/A) INTRAOPERATIVE TRANSESOPHAGEAL ECHOCARDIOGRAM (N/A)  Subjective: Patient passing flatus but no bowel movement yet.  Objective: Vital signs in last 24 hours: Temp:  [97.7 F (36.5 C)-98.5 F (36.9 C)] 97.8 F (36.6 C) (06/30 0457) Pulse Rate:  [79-93] 81 (06/30 0457) Cardiac Rhythm:  [-] Normal sinus rhythm (06/29 1400) Resp:  [18] 18 (06/30 0457) BP: (115-154)/(65-81) 121/65 mmHg (06/30 0457) SpO2:  [91 %-98 %] 96 % (06/30 0457) Weight:  [325 lb 3.2 oz (147.51 kg)] 325 lb 3.2 oz (147.51 kg) (06/30 0457)  Pre op weight 135 kg Current Weight  09/28/13 325 lb 3.2 oz (147.51 kg)      Intake/Output from previous day: 06/29 0701 - 06/30 0700 In: 840 [P.O.:840] Out: 3325 [Urine:3325]   Physical Exam:  Cardiovascular: RRR Pulmonary: Slightly diminished at bases; no rales, wheezes, or rhonchi. Abdomen: Soft, non tender, bowel sounds present. Extremities: Bilateral lower extremity edema. Wounds: Clean and dry.  No erythema or signs of infection.  Lab Results: CBC: Recent Labs  09/27/13 0500 09/28/13 0430  WBC 10.4 8.8  HGB 9.2* 8.8*  HCT 28.2* 26.5*  PLT 208 237   BMET:  Recent Labs  09/27/13 0500 09/28/13 0430  NA 132* 134*  K 4.2 4.1  CL 91* 92*  CO2 29 30  GLUCOSE 158* 114*  BUN 24* 19  CREATININE 0.95 0.89  CALCIUM 8.5 8.6    PT/INR:  Lab Results  Component Value Date   INR 1.36 09/24/2013   INR 1.08 09/22/2013   INR 1.0 09/09/2008   ABG:  INR: Will add last result for INR, ABG once components are confirmed Will add last 4 CBG results once components are confirmed  Assessment/Plan:  1. CV - Previous a fib with RVR.SR in the 90's this am. On Lopressor 25  bid and Amiodarone drip. Stop Amiodarone drip one hour after oral given. 2.  Pulmonary - Desats with ambulation. On 1 liter oxygen via [place]. Wean oxygen as tolerates.Continue Symbicort. Encourage incentive spirometer 3. Volume Overload - On Lasix 40 IV daily with good diuresis 4.  Acute blood loss anemia - H and H decreased to 8.8 and 26.5 this am. Start Trinsicon  5. DM-CBGs 110/106/98. On Insulin. Pre op HGA1C 6.6. He is a new diabetic. Will stop scheduled Insulin and start Metformin. He will need follow up with medical doctor at discharge. 6. Mild hypnatremia-sodium up to 134. Related to diuresis. Monitor 7. Remove EPW  8. LOC constipation 9. Possible discharge 48 hours  ZIMMERMAN,DONIELLE MPA-C 09/28/2013,8:28 AM  patient examined and medical record reviewed,agree with above note. [name],PETER 09/28/2013

## 2013-09-28 NOTE — Progress Notes (Signed)
CARDIAC REHAB PHASE I   PRE:  Rate/Rhythm: 92 afib  BP:  Supine:   Sitting: 114/75  Standing:    SaO2: 95%RA  MODE:  Ambulation: 550 ft   POST:  Rate/Rhythm: 126 afib  BP:  Supine:   Sitting: 121/74  Standing:    SaO2: 94,95% RA hall 1345-1407 Pt walked 550 ft on RA with rolling walker with steady gait. Stopped twice to rest and checked sats at 94 and 95%RA. To sitting on side of bed after walk. Tolerated well. Denied palpitations with elevated heart rate.   Graylon Good, RN BSN  09/28/2013 2:03 PM

## 2013-09-28 NOTE — Anesthesia Postprocedure Evaluation (Signed)
  Anesthesia Post-op Note  Patient: Nathan Hull  Procedure(s) Performed: Procedure(s): CORONARY ARTERY BYPASS GRAFTING (CABG) x three, using LIMA to LAD, and right leg greater saphenous vein harvested endoscopically - SVG to Ramus, SVG to PL (N/A) INTRAOPERATIVE TRANSESOPHAGEAL ECHOCARDIOGRAM (N/A)  Patient Location: Nursing Unit  Anesthesia Type:General  Level of Consciousness: awake, alert , oriented and patient cooperative  Airway and Oxygen Therapy: Patient Spontanous Breathing and Patient connected to nasal cannula oxygen  Post-op Pain: mild  Post-op Assessment: Post-op Vital signs reviewed, Patient's Cardiovascular Status Stable, Respiratory Function Stable, Patent Airway, No signs of Nausea or vomiting and Pain level controlled  Post-op Vital Signs: Reviewed and stable  Last Vitals:  Filed Vitals:   09/28/13 1350  BP: 126/85  Pulse: 101  Temp: 36.3 C  Resp: 18    Complications: No apparent anesthesia complications

## 2013-09-28 NOTE — Progress Notes (Signed)
EPW removed per order. Ends intact on ventricular side. One atrial end not intact. PA notified. No new orders received.  VSS. Pt instructed of one hour bedrest. Verbalized understanding. Will continue to monitor pt cloesly.

## 2013-09-29 ENCOUNTER — Inpatient Hospital Stay (HOSPITAL_COMMUNITY): Payer: BC Managed Care – PPO

## 2013-09-29 LAB — BASIC METABOLIC PANEL
BUN: 17 mg/dL (ref 6–23)
CALCIUM: 8.6 mg/dL (ref 8.4–10.5)
CO2: 29 mEq/L (ref 19–32)
Chloride: 91 mEq/L — ABNORMAL LOW (ref 96–112)
Creatinine, Ser: 0.84 mg/dL (ref 0.50–1.35)
GFR calc Af Amer: >90 mL/min (ref >90)
GFR calc non Af Amer: >90 mL/min (ref >90)
GLUCOSE: 116 mg/dL — AB (ref 70–99)
POTASSIUM: 4 meq/L (ref 3.7–5.3)
Sodium: 133 mEq/L — ABNORMAL LOW (ref 137–147)

## 2013-09-29 LAB — GLUCOSE, CAPILLARY
Glucose-Capillary: 122 mg/dL — ABNORMAL HIGH (ref 70–99)
Glucose-Capillary: 112 mg/dL — ABNORMAL HIGH (ref 70–99)
Glucose-Capillary: 119 mg/dL — ABNORMAL HIGH (ref 70–99)
Glucose-Capillary: 97 mg/dL (ref 70–99)

## 2013-09-29 LAB — CBC
HCT: 26.8 % — ABNORMAL LOW (ref 39.0–52.0)
Hemoglobin: 8.7 g/dL — ABNORMAL LOW (ref 13.0–17.0)
MCH: 28.3 pg (ref 26.0–34.0)
MCHC: 32.5 g/dL (ref 30.0–36.0)
MCV: 87.3 fL (ref 78.0–100.0)
Platelets: 323 10*3/uL (ref 150–400)
RBC: 3.07 MIL/uL — ABNORMAL LOW (ref 4.22–5.81)
RDW: 14 % (ref 11.5–15.5)
WBC: 9.9 10*3/uL (ref 4.0–10.5)

## 2013-09-29 LAB — PROTIME-INR
INR: 1.14 (ref 0.00–1.49)
PROTHROMBIN TIME: 14.6 s (ref 11.6–15.2)

## 2013-09-29 MED ORDER — METOPROLOL TARTRATE 50 MG PO TABS
50.0000 mg | ORAL_TABLET | Freq: Two times a day (BID) | ORAL | Status: DC
  Administered 2013-09-29 – 2013-10-01 (×5): 50 mg via ORAL
  Filled 2013-09-29 (×6): qty 1

## 2013-09-29 MED ORDER — PATIENT'S GUIDE TO USING COUMADIN BOOK
Freq: Once | Status: AC
  Administered 2013-09-29: 18:00:00
  Filled 2013-09-29: qty 1

## 2013-09-29 MED ORDER — DIGOXIN 250 MCG PO TABS
0.2500 mg | ORAL_TABLET | Freq: Every day | ORAL | Status: AC
  Administered 2013-09-29: 0.25 mg via ORAL
  Filled 2013-09-29: qty 1

## 2013-09-29 MED ORDER — WARFARIN SODIUM 5 MG PO TABS
5.0000 mg | ORAL_TABLET | Freq: Every day | ORAL | Status: AC
  Administered 2013-09-29: 5 mg via ORAL
  Filled 2013-09-29: qty 1

## 2013-09-29 MED ORDER — WARFARIN - PHYSICIAN DOSING INPATIENT: Freq: Every day | Status: DC

## 2013-09-29 MED ORDER — DIGOXIN 125 MCG PO TABS
0.1250 mg | ORAL_TABLET | Freq: Every day | ORAL | Status: DC
  Administered 2013-09-30 – 2013-10-01 (×2): 0.125 mg via ORAL
  Filled 2013-09-29 (×2): qty 1

## 2013-09-29 NOTE — Progress Notes (Signed)
CARDIAC REHAB PHASE I   PRE:  Rate/Rhythm: 106 afib  BP:  Supine:   Sitting: 123/79  Standing:    SaO2: 90-91%RA  MODE:  Ambulation: 150 ft   POST:  Rate/Rhythm: up to 144 afib  BP:  Supine:   Sitting: 137/84  Standing:    SaO2: 89%RA 1000-1025 Pt more SOB and weaker with walk so cut it short. Walked 150 ft with rolling walker and heart rate to 144. Sats not as good as with walk yesterday. Back to recliner. Pt has been using IS.  Encouraged walks later with staff when heart rate down.   Graylon Good, RN BSN  09/29/2013 10:22 AM

## 2013-09-29 NOTE — Progress Notes (Signed)
Patient ambulated in hallway approx 150 feet. SATS 95% on room air. Tolerated walk well, but little pain. This completes second walk for today.  Will continue to encourage activity. Nathan Hull R

## 2013-09-29 NOTE — Progress Notes (Addendum)
      [address]       [place] [address]             [phone]      5 Days Post-Op Procedure(s) (LRB): CORONARY ARTERY BYPASS GRAFTING (CABG) x three, using LIMA to LAD, and right leg greater saphenous vein harvested endoscopically - SVG to Ramus, SVG to PL (N/A) INTRAOPERATIVE TRANSESOPHAGEAL ECHOCARDIOGRAM (N/A)  Subjective:  [name] states he is doing okay this morning.  He does complain of not feeling that great being he is back in Atrial Fibrillation since yesterday afternoon.  He is ambulating  + BM  Objective: Vital signs in last 24 hours: Temp:  [97.4 F (36.3 C)-98.1 F (36.7 C)] 98.1 F (36.7 C) (07/01 0459) Pulse Rate:  [95-106] 102 (07/01 0459) Cardiac Rhythm:  [-] Atrial fibrillation (06/30 2002) Resp:  [18] 18 (06/30 2136) BP: (113-147)/(69-85) 126/69 mmHg (07/01 0459) SpO2:  [93 %-94 %] 93 % (07/01 0459) Weight:  [298 lb 4.8 oz (135.308 kg)] 298 lb 4.8 oz (135.308 kg) (07/01 0435)  Intake/Output from previous day: 06/30 0701 - 07/01 0700 In: 840 [P.O.:840] Out: 350 [Urine:350]  General appearance: alert, cooperative and no distress Heart: irregularly irregular rhythm Lungs: clear to auscultation bilaterally Abdomen: soft, non-tender; bowel sounds normal; no masses,  no organomegaly Extremities: edema 1-2+ Wound: clean and dyr  Lab Results:  Recent Labs  09/28/13 0430 09/29/13 0510  WBC 8.8 9.9  HGB 8.8* 8.7*  HCT 26.5* 26.8*  PLT 237 323   BMET:  Recent Labs  09/28/13 0430 09/29/13 0510  NA 134* 133*  K 4.1 4.0  CL 92* 91*  CO2 30 29  GLUCOSE 114* 116*  BUN 19 17  CREATININE 0.89 0.84  CALCIUM 8.6 8.6    PT/INR: No results found for this basename: LABPROT, INR,  in the last 72 hours ABG    Component Value Date/Time   PHART 7.373 09/25/2013 0440   HCO3 25.5* 09/25/2013 0440   TCO2 26 09/25/2013 1723   ACIDBASEDEF 1.0 09/24/2013 1723   O2SAT 94.0 09/25/2013 0440   CBG (last 3)   Recent Labs  09/28/13 1653  09/28/13 2132 09/29/13 0652  GLUCAP 116* 114* 122*    Assessment/Plan: S/P Procedure(s) (LRB): CORONARY ARTERY BYPASS GRAFTING (CABG) x three, using LIMA to LAD, and right leg greater saphenous vein harvested endoscopically - SVG to Ramus, SVG to PL (N/A) INTRAOPERATIVE TRANSESOPHAGEAL ECHOCARDIOGRAM (N/A)  1. CV- Atrial Fibrillation rate 90s-120s- will continue Amiodarone, increased Lopressor to 50 mg BID, will start Coumadin 2. Pulm- no acute issues, continue IS 3. Renal- creatinine stable, = LE edema- continue Lasix 4. Expected Acute Blood Loss Anemia- continue Iron 5. DM- sugars controlled, off insulin, has been started on Metformin, diabetes education complete 6. Dispo- patient with continued Atrial Fibrillation will start Coumadin, will monitor until INR starts to trend up   LOS: 5 days    BARRETT, ERIN 09/29/2013 Add lanoxin to amiodarone and lopressor- convert to nsr before DC home CXR looks good patient examined and medical record reviewed,agree with above note. [name],PETER 09/29/2013

## 2013-09-29 NOTE — Progress Notes (Signed)
Pt complaining of some soreness in upper chest and arm. Pt stated he strained somewhat yesterday afternoon going to the bathroom. Pt having some episodes of SOB. Pt oxygen saturation >93% on RA.Marland Kitchen Pt's vital signs stable. Pt OOB in chair.

## 2013-09-29 NOTE — Progress Notes (Signed)
Pt weighed this morning on unit scale A. Pt weighed 135.3 kg. Yesterday pt weighed on unit silver scale and weighed 147.51 kg. Silver scale is not accurate. I was able to weigh myself on each scale and the silver scale is inaccurate. I weighed myself twice on the silver scale getting a 6-10 lb difference within 2 minutes on the same scale. The scale was properly zeroed each time.

## 2013-09-30 DIAGNOSIS — I4891 Unspecified atrial fibrillation: Secondary | ICD-10-CM

## 2013-09-30 LAB — GLUCOSE, CAPILLARY
Glucose-Capillary: 106 mg/dL — ABNORMAL HIGH (ref 70–99)
Glucose-Capillary: 118 mg/dL — ABNORMAL HIGH (ref 70–99)
Glucose-Capillary: 88 mg/dL (ref 70–99)
Glucose-Capillary: 87 mg/dL (ref 70–99)

## 2013-09-30 LAB — PROTIME-INR
INR: 1.18 (ref 0.00–1.49)
Prothrombin Time: 15 seconds (ref 11.6–15.2)

## 2013-09-30 MED ORDER — WARFARIN SODIUM 5 MG PO TABS
5.0000 mg | ORAL_TABLET | Freq: Every day | ORAL | Status: DC
  Administered 2013-09-30: 5 mg via ORAL
  Filled 2013-09-30 (×2): qty 1

## 2013-09-30 MED ORDER — LISINOPRIL 5 MG PO TABS
5.0000 mg | ORAL_TABLET | Freq: Every day | ORAL | Status: DC
  Administered 2013-09-30 – 2013-10-01 (×2): 5 mg via ORAL
  Filled 2013-09-30 (×2): qty 1

## 2013-09-30 NOTE — Discharge Summary (Signed)
Physician Discharge Summary  Patient ID: Nathan Hull MRN: [id number] DOB/AGE: 04/25/1960 53 y.o.  Admit date: 09/24/2013 Discharge date: 09/30/2013  Admission Diagnoses:  Patient Active Problem List   Diagnosis Date Noted  . CAD (coronary artery disease) 09/20/2013   Discharge Diagnoses:   Patient Active Problem List   Diagnosis Date Noted  . Atrial fibrillation 09/30/2013  . 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 CAD (coronary artery disease) 09/20/2013   Discharged Condition: good  History of Present Illness:   [name] is a 53 yo obese white male with known history of CAD.  He is S/P MI with PCI with stent to RCA in 2007, and repeat PCI with stent placement to RCA and Diagonal in 2011.  The patient did well since that time.  However, recently he has developed angina symptoms with radiation to upper arm during exertion, but this has also occurred at rest.  He underwent Myocardial perfusion scan which was abnormal.  Due to this patient underwent cardiac catheterization which showed evidence of severe multivessel CAD with LM involvement.  It was felt he would benefit from Cardiac Bypass Surgery.  [facility] was consulted and the patient was admitted by Dr. Prescott Gum.    Hospital Course:   Upon arrival the patient was chest pain free on arrival.  He was evaluated by Dr. Prescott Gum who felt he would be a surgical candidate.  The risks and benefits of the procedure were explained to the patient who was agreeable to proceed with surgery.  He was taken to the operating room on 09/24/2013.  He underwent CABG x 3 utilizing LIMA to LAD, SVG to Ramus Intermediate, and SVG to PLB.  He also underwent Endoscopic saphenous vein harvest from his right leg.  He tolerated the procedure well and was taken to the SICU in stable condition.  The patient was extubated the evening of surgery.  During the patient's stay in the SICU the patient was weaned off insulin drip as tolerated.  His preop A1c was 6.6 which is a new  diagnosis of diabetes for this patient.  His chest tubes and arterial lines were removed without difficulty.  He was hypervolemic and aggressively diuresed with IV Lasix.  The patient was maintaining NSR and felt medically stable for transfer to the step down unit.    The patient developed Atrial Fibrillation the evening of transfer.  He was subsequently treated with IV Amiodarone without successful conversion to NSR.  He was re-bolused with Amiodarone and his Lopressor dose was increased.  The patient later converted to NSR and he was transitioned to an oral regimen of Amiodarone.  The patient's pacing wires were removed without difficulty.  He again developed Atrial Fibrillation and was started on Digoxin and Coumadin therapy.  He again converted to NSR.  His blood sugars have been well controlled.  He was taken off insulin and started on Metformin.  He is ambulating without difficulty.  He is tolerating a carb modified diet.  His INR will need to be 2.0-3.0.  His coumadin dose will be titrated as tolerated.  He is medically stable.  Should no further issues arise we anticipate discharge in the next 24-48 hours.          Consults: Diabetes Coordinator  Treatments: surgery:   Coronary artery bypass grafting x3 (left internal mammary artery LAD, saphenous vein graft to posterior lateral branch of the right coronary, saphenous vein graft to ramus intermediate). 2. Endoscopic harvest right leg greater saphenous vein.  Disposition:  Home  Discharge Medications:     Medication List    STOP taking these medications       clopidogrel 75 MG tablet  Commonly known as:  PLAVIX     ranolazine 500 MG 12 hr tablet  Commonly known as:  RANEXA      TAKE these medications       amiodarone 200 MG tablet  Commonly known as:  PACERONE  Take 2 tablets (400 mg total) by mouth 2 (two) times daily. For 7 Days, then decrease to 200 mg twice a day for 7 days, then decrease to 200 mg daily     aspirin EC 81  MG tablet  Take 81 mg by mouth daily.     atorvastatin 80 MG tablet  Commonly known as:  LIPITOR  Take 80 mg by mouth daily.     digoxin 0.125 MG tablet  Commonly known as:  LANOXIN  Take 1 tablet (0.125 mg total) by mouth daily.     ferrous [id number] C-folic acid capsule  Commonly known as:  TRINSICON / FOLTRIN  Take 1 capsule by mouth 2 (two) times daily after a meal.     freestyle lancets  Use as instructed     furosemide 40 MG tablet  Commonly known as:  LASIX  Take 1 tablet (40 mg total) by mouth daily. For 7 Days     glucose blood test strip  Use as instructed     glucose monitoring kit monitoring kit  1 each by Does not apply route as needed for other.     lisinopril 5 MG tablet  Commonly known as:  PRINIVIL,ZESTRIL  Take 1 tablet (5 mg total) by mouth daily.     metFORMIN 500 MG tablet  Commonly known as:  GLUCOPHAGE  Take 1 tablet (500 mg total) by mouth 2 (two) times daily with a meal.     metoprolol 50 MG tablet  Commonly known as:  LOPRESSOR  Take 50 mg by mouth 2 (two) times daily.     oxyCODONE 5 MG immediate release tablet  Commonly known as:  Oxy IR/ROXICODONE  Take 1-2 tablets (5-10 mg total) by mouth every 3 (three) hours as needed for moderate pain.     potassium chloride SA 20 MEQ tablet  Commonly known as:  K-DUR,KLOR-CON  Take 1 tablet (20 mEq total) by mouth daily.     warfarin 5 MG tablet  Commonly known as:  COUMADIN  Take 1 tablet (5 mg total) by mouth daily at 6 PM.         The patient has been discharged on:   1.Beta Blocker:  Yes [x   ]                              No   [   ]                              If No, reason:  2.Ace Inhibitor/ARB: Yes [x ]                                     No  [    ]  If No, reason:  3.Statin:   Yes [x   ]                  No  [   ]                  If No, reason:  4.Ecasa:  Yes  [ x  ]                  No   [   ]                  If No,  reason:        Follow-up Information   Follow up with [facility] [place] On 10/25/2013. (Appointment is at 2:00)    Contact information:   [address] [place] [phone]       Follow up with [facility] On 10/25/2013. (Please get CXR at 1:00 prior to your appointment with [facility])    Specialty:  Diagnostic Radiology   Contact information:   [address] [address] [phone]       Follow up with Neoma Laming A., MD. (Please contact office for follow up appointment)    Specialty:  Cardiology   Contact information:   [address] [place] [address] [phone]       Follow up with Carmon Ginsberg, MD In 2 weeks. (Please schedule follow up for new onset diabetes)    Specialty:  Family Medicine   Contact information:   [address] [place] [address] [phone]       Signed: Ellwood Handler 09/30/2013, 10:31 AM

## 2013-09-30 NOTE — Progress Notes (Signed)
Pt ambulated 200 ft on RA independently. Pt's oxygen saturation after ambulation was 97% on RA. Pt had no complaints or pain after ambulation. Pt back in chair. Will continue to monitor.

## 2013-09-30 NOTE — Progress Notes (Signed)
UR Completed.  Toniann Dickerson Jane [phone] 09/30/2013  

## 2013-09-30 NOTE — Progress Notes (Signed)
CARDIAC REHAB PHASE I   PRE:  Rate/Rhythm: 91 SR  BP:  Supine:   Sitting: 143/95  Standing:    SaO2: 92 RA  MODE:  Ambulation: 350 ft   POST:  Rate/Rhythm: 110 ST  BP:  Supine:   Sitting: 161/89  Standing:    SaO2: 92 RA 0915-1000 Pt tolerated ambulation well. Gait steady without walker. He is DOE, room air sat after walk 92%. Pt was able to walk 350 feet. BP up before and after walk. He remained in SR walking. Pt back to recliner after walk with call light in reach. Completed discharge education with pt He voices understanding. Pt is interested in [facility]. [facility] in [place], will send a letter of interest to them. I put on recovery from heart surgery video  for him to watch.  Rodney Langton RN 09/30/2013 9:59 AM

## 2013-09-30 NOTE — Progress Notes (Addendum)
      [address]       [place] [address]             [phone]      6 Days Post-Op Procedure(s) (LRB): CORONARY ARTERY BYPASS GRAFTING (CABG) x three, using LIMA to LAD, and right leg greater saphenous vein harvested endoscopically - SVG to Ramus, SVG to PL (N/A) INTRAOPERATIVE TRANSESOPHAGEAL ECHOCARDIOGRAM (N/A)  Subjective:  Nathan Hull is feeling much better this morning.  He converted to NSR around 530 this morning.  + ambulation  + BM  Objective: Vital signs in last 24 hours: Temp:  [98.2 F (36.8 C)-98.5 F (36.9 C)] 98.5 F (36.9 C) (07/02 0533) Pulse Rate:  [78-124] 94 (07/02 0533) Cardiac Rhythm:  [-] Atrial fibrillation (07/01 2016) Resp:  [18] 18 (07/01 2144) BP: (98-140)/(65-83) 140/83 mmHg (07/02 0533) SpO2:  [93 %-97 %] 97 % (07/02 0533) Weight:  [299 lb 3.2 oz (135.716 kg)] 299 lb 3.2 oz (135.716 kg) (07/02 0500)  Intake/Output from previous day: 07/01 0701 - 07/02 0700 In: 720 [P.O.:720] Out: 1950 [Urine:1950]  General appearance: alert, cooperative and no distress Heart: regular rate and rhythm Lungs: clear to auscultation bilaterally Abdomen: soft, non-tender; bowel sounds normal; no masses,  no organomegaly Extremities: edema 1+ Wound: clean and dry  Lab Results:  Recent Labs  09/28/13 0430 09/29/13 0510  WBC 8.8 9.9  HGB 8.8* 8.7*  HCT 26.5* 26.8*  PLT 237 323   BMET:  Recent Labs  09/28/13 0430 09/29/13 0510  NA 134* 133*  K 4.1 4.0  CL 92* 91*  CO2 30 29  GLUCOSE 114* 116*  BUN 19 17  CREATININE 0.89 0.84  CALCIUM 8.6 8.6    PT/INR:  Recent Labs  09/30/13 0545  LABPROT 15.0  INR 1.18   ABG    Component Value Date/Time   PHART 7.373 09/25/2013 0440   HCO3 25.5* 09/25/2013 0440   TCO2 26 09/25/2013 1723   ACIDBASEDEF 1.0 09/24/2013 1723   O2SAT 94.0 09/25/2013 0440   CBG (last 3)   Recent Labs  09/29/13 1116 09/29/13 1622 09/29/13 2142  GLUCAP 112* 119* 97    Assessment/Plan: S/P  Procedure(s) (LRB): CORONARY ARTERY BYPASS GRAFTING (CABG) x three, using LIMA to LAD, and right leg greater saphenous vein harvested endoscopically - SVG to Ramus, SVG to PL (N/A) INTRAOPERATIVE TRANSESOPHAGEAL ECHOCARDIOGRAM (N/A)  1. CV- Previous A. Fib, converted to NSR this morning- will continue Lopressor, Amiodarone, Digoxin 2. INR 1.18- will repeat Coumadin at 5 mg daily, if no jump in INR will increase dosage tomorrow 3. Renal- patient with LE edema, weight stable on Lasix 4. DM- CBGs controlled, continue Metformin 5. Dispo- patient now maintaining NSR, continue current medication regimen, continue Coumadin- if remains in NSR and INR trends up possibly d/c tomorrow   LOS: 6 days    BARRETT, ERIN 09/30/2013  Needs NSR to rehab adequately Cont triple drug therapy  4 weeks of coumadin goal 2-2.5 INR

## 2013-09-30 NOTE — Discharge Instructions (Addendum)
Coronary Artery Bypass Grafting, Care After °Refer to this sheet in the next few weeks. These instructions provide you with information on caring for yourself after your procedure. Your health care provider may also give you more specific instructions. Your treatment has been planned according to current medical practices, but problems sometimes occur. Call your health care provider if you have any problems or questions after your procedure. °WHAT TO EXPECT AFTER THE PROCEDURE °Recovery from surgery will be different for everyone. Some people feel well after 3 or 4 weeks, while for others it takes longer. After your procedure, it is typical to have the following: °· Nausea and a lack of appetite.   °· Constipation. °· Weakness and fatigue.   °· Depression or irritability.   °· Pain or discomfort at your incision site. °HOME CARE INSTRUCTIONS °· Take all medicines as directed by your health care provider. Do not stop taking medicines or start any new medicines without first checking with your health care provider. °· Take your pulse as directed by your health care provider. °· Perform deep breathing as directed by your health care provider. If you were given a device called an incentive spirometer, use it to practice deep breathing several times a day. Support your chest with a pillow or your arms when you take deep breaths or cough. °· Keep incision areas clean, dry, and protected. Remove or change any bandages (dressings) only as directed by your health care provider. You may have skin adhesive strips over the incision areas. Do not take the strips off. They will fall off on their own. °· Check incision areas daily for any swelling, redness, or drainage. °· If incisions were made in your legs, do the following: °¨ Avoid crossing your legs.   °¨ Avoid sitting for long periods of time. Change positions every 30 minutes.   °¨ Elevate your legs when you are sitting. °· Wear compression stockings as directed by your  health care provider. These stockings help keep blood clots from forming in your legs. °· Take showers once your health care provider approves. Until then, only take sponge baths. Pat incisions dry. Do not rub incisions with a washcloth or towel. Do not bathe, swim, or use a hot tub until directed by your health care provider. °· Eat foods that are high in fiber, such as raw fruits and vegetables, whole grains, beans, and nuts. Meats should be lean cut. Avoid canned, processed, and fried foods. °· Drink enough fluids to keep your urine clear or pale yellow. °· Weigh yourself every day. This helps identify if you are retaining fluid that may make your heart and lungs work harder. °· Rest and limit activity as directed by your health care provider. You may be instructed to: °¨ Stop any activity at once if you have chest pain, shortness of breath, irregular heartbeats, or dizziness. Get help right away if you have any of these symptoms. °¨ Move around frequently for short periods or take short walks as directed by your health care provider. Increase your activities gradually. You may need physical therapy or cardiac rehabilitation to help strengthen your muscles and build your endurance. °¨ Avoid lifting, pushing, or pulling anything heavier than 10 lb (4.5 kg) for at least 6 weeks after surgery. °· Do not drive until your health care provider approves.  °· Ask your health care provider when you may return to work. °· Ask your health care provider when you may resume sexual activity. °· Follow up with your health care provider as directed. °SEEK   MEDICAL CARE IF: °· You have swelling, redness, increasing pain, or drainage at the site of an incision. °· You have a fever. °· You have swelling in your ankles or legs. °· You have pain in your legs.   °· You gain 2 or more pounds (0.9 kg) a day. °· You are nauseous or vomit. °· You have diarrhea.  °SEEK IMMEDIATE MEDICAL CARE IF: °· You have chest pain that goes to your jaw  or arms. °· You have shortness of breath.   °· You have a fast or irregular heartbeat.   °· You notice a "clicking" in your breastbone (sternum) when you move.   °· You have numbness or weakness in your arms or legs. °· You feel dizzy or light-headed.   °MAKE SURE YOU: °· Understand these instructions. °· Will watch your condition. °· Will get help right away if you are not doing well or get worse. °Document Released: 10/05/2004 Document Revised: 03/23/2013 Document Reviewed: 08/25/2012 °Exit[employer]® Patient Information ©2015 [employer]. This information is not intended to replace advice given to you by your health care provider. Make sure you discuss any questions you have with your health care provider. ° °Endoscopic Saphenous Vein Harvesting °Care After °Refer to this sheet in the next few weeks. These instructions provide you with information on caring for yourself after your procedure. Your health care provider may also give you more specific instructions. Your treatment has been planned according to current medical practices, but problems sometimes occur. Call your health care provider if you have any problems or questions after your procedure. °HOME CARE INSTRUCTIONS °Medicine °· Take whatever pain medicine your surgeon prescribes. Follow the directions carefully. Do not take over-the-counter pain medicine unless your surgeon says it is okay. Some pain medicine can cause bleeding problems for several weeks after surgery. °· Follow your surgeon's instructions about driving. You will probably not be permitted to drive after heart surgery. °· Take any medicines your surgeon prescribes. Any medicines you took before your heart surgery should be checked with your health care provider before you start taking them again. °Wound care °· If your surgeon has prescribed an elastic bandage or stocking, ask how long you should wear it. °· Check the area around your surgical cuts (incisions) whenever your bandages  (dressings) are changed. Look for any redness or swelling. °· You will need to return to have the stitches (sutures) or staples taken out. Ask your surgeon when to do that. °· Ask your surgeon when you can shower or bathe. °Activity °· Try to keep your legs raised when you are sitting. °· Do any exercises your health care providers have given you. These may include deep breathing exercises, coughing, walking, or other exercises. °SEEK MEDICAL CARE IF: °· You have any questions about your medicines. °· You have more leg pain, especially if your pain medicine stops working. °· New or growing bruises develop on your leg. °· Your leg swells, feels tight, or becomes red. °· You have numbness in your leg. °SEEK IMMEDIATE MEDICAL CARE IF: °· Your pain gets much worse. °· Blood or fluid leaks from any of the incisions. °· Your incisions become warm, swollen, or red. °· You have chest pain. °· You have trouble breathing. °· You have a fever. °· You have more pain near your leg incision. °MAKE SURE YOU: °· Understand these instructions. °· Will watch your condition. °· Will get help right away if you are not doing well or get worse. °Document Released: 11/28/2010 Document Revised: 03/23/2013 Document Reviewed:   11/28/2010 Exit[employer] Patient Information 2015 [place], [place]. This information is not intended to replace advice given to you by your health care provider. Make sure you discuss any questions you have with your health care provider.  Diabetes Mellitus and Food It is important for you to manage your blood sugar (glucose) level. Your blood glucose level can be greatly affected by what you eat. Eating healthier foods in the appropriate amounts throughout the day at about the same time each day will help you control your blood glucose level. It can also help slow or prevent worsening of your diabetes mellitus. Healthy eating may even help you improve the level of your blood pressure and reach or maintain a healthy  weight.  HOW CAN FOOD AFFECT ME? Carbohydrates Carbohydrates affect your blood glucose level more than any other type of food. Your dietitian will help you determine how many carbohydrates to eat at each meal and teach you how to count carbohydrates. Counting carbohydrates is important to keep your blood glucose at a healthy level, especially if you are using insulin or taking certain medicines for diabetes mellitus. Alcohol Alcohol can cause sudden decreases in blood glucose (hypoglycemia), especially if you use insulin or take certain medicines for diabetes mellitus. Hypoglycemia can be a life-threatening condition. Symptoms of hypoglycemia (sleepiness, dizziness, and disorientation) are similar to symptoms of having too much alcohol.  If your health care provider has given you approval to drink alcohol, do so in moderation and use the following guidelines:  Women should not have more than one drink per day, and men should not have more than two drinks per day. One drink is equal to:  12 oz of beer.  5 oz of wine.  1 oz of hard liquor.  Do not drink on an empty stomach.  Keep yourself hydrated. Have water, diet soda, or unsweetened iced tea.  Regular soda, juice, and other mixers might contain a lot of carbohydrates and should be counted. WHAT FOODS ARE NOT RECOMMENDED? As you make food choices, it is important to remember that all foods are not the same. Some foods have fewer nutrients per serving than other foods, even though they might have the same number of calories or carbohydrates. It is difficult to get your body what it needs when you eat foods with fewer nutrients. Examples of foods that you should avoid that are high in calories and carbohydrates but low in nutrients include:  Trans fats (most processed foods list trans fats on the Nutrition Facts label).  Regular soda.  Juice.  Candy.  Sweets, such as cake, pie, doughnuts, and cookies.  Fried foods. WHAT FOODS CAN I  EAT? Have nutrient-rich foods, which will nourish your body and keep you healthy. The food you should eat also will depend on several factors, including:  The calories you need.  The medicines you take.  Your weight.  Your blood glucose level.  Your blood pressure level.  Your cholesterol level. You also should eat a variety of foods, including:  Protein, such as meat, poultry, fish, tofu, nuts, and seeds (lean animal proteins are best).  Fruits.  Vegetables.  Dairy products, such as milk, cheese, and yogurt (low fat is best).  Breads, grains, pasta, cereal, rice, and beans.  Fats such as olive oil, trans fat-free margarine, canola oil, avocado, and olives. DOES EVERYONE WITH DIABETES MELLITUS HAVE THE SAME MEAL PLAN? Because every person with diabetes mellitus is different, there is not one meal plan that works for everyone. It is  very important that you meet with a dietitian who will help you create a meal plan that is just right for you. Document Released: 12/13/2004 Document Revised: 03/23/2013 Document Reviewed: 02/12/2013 [facility] Patient Information 2015 [place], [place]. This information is not intended to replace advice given to you by your health care provider. Make sure you discuss any questions you have with your health care provider.   Information on my medicine - Coumadin   (Warfarin)  This medication education was reviewed with me or my healthcare representative as part of my discharge preparation.  The pharmacist that spoke with me during my hospital stay was:  Eudelia Bunch, [facility]  Why was Coumadin prescribed for you? Coumadin was prescribed for you because you have a blood clot or a medical condition that can cause an increased risk of forming blood clots. Blood clots can cause serious health problems by blocking the flow of blood to the heart, lung, or brain. Coumadin can prevent harmful blood clots from forming. As a reminder your indication for Coumadin  is:   Stroke Prevention Because Of Atrial Fibrillation  What test will check on my response to Coumadin? While on Coumadin (warfarin) you will need to have an INR test regularly to ensure that your dose is keeping you in the desired range. The INR (international normalized ratio) number is calculated from the result of the laboratory test called prothrombin time (PT).  If an INR APPOINTMENT HAS NOT ALREADY BEEN MADE FOR YOU please schedule an appointment to have this lab work done by your health care provider within 7 days. Your INR goal is usually a number between:  2 to 3 or your provider may give you a more narrow range like 2-2.5.  Ask your health care provider during an office visit what your goal INR is.  What  do you need to  know  About  COUMADIN? Take Coumadin (warfarin) exactly as prescribed by your healthcare provider about the same time each day.  DO NOT stop taking without talking to the doctor who prescribed the medication.  Stopping without other blood clot prevention medication to take the place of Coumadin may increase your risk of developing a new clot or stroke.  Get refills before you run out.  What do you do if you miss a dose? If you miss a dose, take it as soon as you remember on the same day then continue your regularly scheduled regimen the next day.  Do not take two doses of Coumadin at the same time.  Important Safety Information A possible side effect of Coumadin (Warfarin) is an increased risk of bleeding. You should call your healthcare provider right away if you experience any of the following:   Bleeding from an injury or your nose that does not stop.   Unusual colored urine (red or dark brown) or unusual colored stools (red or black).   Unusual bruising for unknown reasons.   A serious fall or if you hit your head (even if there is no bleeding).  Some foods or medicines interact with Coumadin (warfarin) and might alter your response to warfarin. To help avoid  this:   Eat a balanced diet, maintaining a consistent amount of Vitamin K.   Notify your provider about major diet changes you plan to make.   Avoid alcohol or limit your intake to 1 drink for women and 2 drinks for men per day. (1 drink is 5 oz. wine, 12 oz. beer, or 1.5 oz. liquor.)  Make  sure that ANY health care provider who prescribes medication for you knows that you are taking Coumadin (warfarin).  Also make sure the healthcare provider who is monitoring your Coumadin knows when you have started a new medication including herbals and non-prescription products.  Coumadin (Warfarin)  Major Drug Interactions  Increased Warfarin Effect Decreased Warfarin Effect  Alcohol (large quantities) Antibiotics (esp. Septra/Bactrim, Flagyl, Cipro) Amiodarone (Cordarone) Aspirin (ASA) Cimetidine (Tagamet) Megestrol (Megace) NSAIDs (ibuprofen, naproxen, etc.) Piroxicam (Feldene) Propafenone (Rythmol SR) Propranolol (Inderal) Isoniazid (INH) Posaconazole (Noxafil) Barbiturates (Phenobarbital) Carbamazepine (Tegretol) Chlordiazepoxide (Librium) Cholestyramine (Questran) Griseofulvin Oral Contraceptives Rifampin Sucralfate (Carafate) Vitamin K   Coumadin (Warfarin) Major Herbal Interactions  Increased Warfarin Effect Decreased Warfarin Effect  Garlic Ginseng Ginkgo biloba Coenzyme Q10 Green tea St. Johns wort    Coumadin (Warfarin) FOOD Interactions  Eat a consistent number of servings per week of foods HIGH in Vitamin K (1 serving =  cup)  Collards (cooked, or boiled & drained) Kale (cooked, or boiled & drained) Mustard greens (cooked, or boiled & drained) Parsley *serving size only =  cup Spinach (cooked, or boiled & drained) Swiss chard (cooked, or boiled & drained) Turnip greens (cooked, or boiled & drained)  Eat a consistent number of servings per week of foods MEDIUM-HIGH in Vitamin K (1 serving = 1 cup)  Asparagus (cooked, or boiled & drained) Broccoli (cooked,  boiled & drained, or raw & chopped) Brussel sprouts (cooked, or boiled & drained) *serving size only =  cup Lettuce, raw (green leaf, endive, romaine) Spinach, raw Turnip greens, raw & chopped   These websites have more information on Coumadin (warfarin):  FailFactory.se; VeganReport.com.au;

## 2013-09-30 NOTE — Progress Notes (Signed)
CCMD notified RN pt converted to NSR.

## 2013-10-01 LAB — PROTIME-INR
INR: 1.24 (ref 0.00–1.49)
Prothrombin Time: 15.6 seconds — ABNORMAL HIGH (ref 11.6–15.2)

## 2013-10-01 LAB — GLUCOSE, CAPILLARY: Glucose-Capillary: 109 mg/dL — ABNORMAL HIGH (ref 70–99)

## 2013-10-01 MED ORDER — POTASSIUM CHLORIDE CRYS ER 20 MEQ PO TBCR: 20.0000 meq | EXTENDED_RELEASE_TABLET | Freq: Every day | ORAL | Status: DC

## 2013-10-01 MED ORDER — WARFARIN SODIUM 5 MG PO TABS: 5.0000 mg | ORAL_TABLET | Freq: Every day | ORAL | Status: DC

## 2013-10-01 MED ORDER — FREESTYLE LANCETS MISC: Status: DC

## 2013-10-01 MED ORDER — FUROSEMIDE 40 MG PO TABS: 40.0000 mg | ORAL_TABLET | Freq: Every day | ORAL | Status: DC

## 2013-10-01 MED ORDER — LISINOPRIL 5 MG PO TABS: 5.0000 mg | ORAL_TABLET | Freq: Every day | ORAL | Status: DC

## 2013-10-01 MED ORDER — FREESTYLE SYSTEM KIT: 1.0000 | PACK | Status: DC | PRN

## 2013-10-01 MED ORDER — METFORMIN HCL 500 MG PO TABS: 500.0000 mg | ORAL_TABLET | Freq: Two times a day (BID) | ORAL | Status: DC

## 2013-10-01 MED ORDER — FE FUMARATE-B12-VIT C-FA-IFC PO CAPS: 1.0000 | ORAL_CAPSULE | Freq: Two times a day (BID) | ORAL | Status: DC

## 2013-10-01 MED ORDER — OXYCODONE HCL 5 MG PO TABS: 5.0000 mg | ORAL_TABLET | ORAL | Status: DC | PRN

## 2013-10-01 MED ORDER — DIGOXIN 125 MCG PO TABS: 0.1250 mg | ORAL_TABLET | Freq: Every day | ORAL | Status: DC

## 2013-10-01 MED ORDER — AMIODARONE HCL 200 MG PO TABS: 400.0000 mg | ORAL_TABLET | Freq: Two times a day (BID) | ORAL | Status: DC

## 2013-10-01 MED ORDER — GLUCOSE BLOOD VI STRP: ORAL_STRIP | Status: DC

## 2013-10-01 NOTE — Progress Notes (Signed)
1040: Central line removed following protocol. End intact. No bleeding noted. Bed rest per protocol.   1145: Went over discharge instructions with patient. Also had patient to teach back to me care instructions post cardiac surgery. Patient was able to teach back correct information, and had no additional questions or concerns related to discharge. Also went over new medications and their purpose. Patient given paper prescriptions and appointment reminder cards. Patient discharged home with parents.   Roxan Hockey, RN

## 2013-10-01 NOTE — Progress Notes (Addendum)
      [address]       [place] [address]             [phone]      7 Days Post-Op Procedure(s) (LRB): CORONARY ARTERY BYPASS GRAFTING (CABG) x three, using LIMA to LAD, and right leg greater saphenous vein harvested endoscopically - SVG to Ramus, SVG to PL (N/A) INTRAOPERATIVE TRANSESOPHAGEAL ECHOCARDIOGRAM (N/A)  Subjective:  [name] states he feels great this morning.  He is hoping to be discharged today.  + ambulation, + BM  Objective: Vital signs in last 24 hours: Temp:  [97.7 F (36.5 C)-98.2 F (36.8 C)] 98.1 F (36.7 C) (07/03 0551) Pulse Rate:  [78-97] 79 (07/03 0551) Cardiac Rhythm:  [-] Normal sinus rhythm (07/02 2032) Resp:  [17-18] 18 (07/03 0551) BP: (105-161)/(62-89) 107/63 mmHg (07/03 0551) SpO2:  [93 %-96 %] 96 % (07/03 0551) Weight:  [300 lb 9.6 oz (136.351 kg)] 300 lb 9.6 oz (136.351 kg) (07/03 0551)  Intake/Output from previous day: 07/02 0701 - 07/03 0700 In: 720 [P.O.:720] Out: 4325 [Urine:4325]  General appearance: alert, cooperative and no distress Heart: regular rate and rhythm Lungs: clear to auscultation bilaterally Abdomen: soft, non-tender; bowel sounds normal; no masses,  no organomegaly Extremities: edema 1+ Wound: clean and dry  Lab Results:  Recent Labs  09/29/13 0510  WBC 9.9  HGB 8.7*  HCT 26.8*  PLT 323   BMET:  Recent Labs  09/29/13 0510  NA 133*  K 4.0  CL 91*  CO2 29  GLUCOSE 116*  BUN 17  CREATININE 0.84  CALCIUM 8.6    PT/INR:  Recent Labs  10/01/13 0525  LABPROT 15.6*  INR 1.24   ABG    Component Value Date/Time   PHART 7.373 09/25/2013 0440   HCO3 25.5* 09/25/2013 0440   TCO2 26 09/25/2013 1723   ACIDBASEDEF 1.0 09/24/2013 1723   O2SAT 94.0 09/25/2013 0440   CBG (last 3)   Recent Labs  09/30/13 1636 09/30/13 2140 10/01/13 0627  GLUCAP 88 87 109*    Assessment/Plan: S/P Procedure(s) (LRB): CORONARY ARTERY BYPASS GRAFTING (CABG) x three, using LIMA to LAD, and  right leg greater saphenous vein harvested endoscopically - SVG to Ramus, SVG to PL (N/A) INTRAOPERATIVE TRANSESOPHAGEAL ECHOCARDIOGRAM (N/A)  1. CV- NSR rate and pressure control- continue Amiodarone, Lopressor, Digoxin, Lisinopril 2. INR 1.24- has had 2 doses of Coumadin, will continue 5 mg daily 3. Renal- LE edema improving, continue Lasix 4. DM- CBGs controlled, continue Metformin 5. Dispo- patient is maintaining NSR, on Coumadin, will need PT/INR check on Monday, F/U with PCP for DM   LOS: 7 days    Ahmed Prima, Junie Panning 10/01/2013

## 2013-10-05 NOTE — Discharge Summary (Signed)
patient examined and medical record reviewed,agree with above note. [name],[name] 10/05/2013   

## 2013-10-06 ENCOUNTER — Other Ambulatory Visit: Payer: Self-pay | Admitting: Cardiothoracic Surgery

## 2013-10-06 ENCOUNTER — Encounter: Payer: Self-pay | Admitting: Cardiothoracic Surgery

## 2013-10-06 ENCOUNTER — Ambulatory Visit (INDEPENDENT_AMBULATORY_CARE_PROVIDER_SITE_OTHER): Payer: Self-pay | Admitting: Cardiothoracic Surgery

## 2013-10-06 VITALS — BP 136/74 | HR 62 | Temp 97.1°F | Resp 20 | Ht 72.0 in | Wt 300.0 lb

## 2013-10-06 DIAGNOSIS — I2510 Atherosclerotic heart disease of native coronary artery without angina pectoris: Secondary | ICD-10-CM

## 2013-10-06 DIAGNOSIS — Z951 Presence of aortocoronary bypass graft: Secondary | ICD-10-CM

## 2013-10-06 NOTE — Progress Notes (Signed)
PCP is Carmon Ginsberg, MD Referring Provider is Dionisio David, MD  Chief Complaint  Patient presents with  . Routine Post Op    c/o swelling with redness around EVH site x 1 day, S/P CABG    HPI: Obese diabetic status post urgent CABG x3 returns with cellulitis in his right distal thigh at the endoscopic vein harvest site. There is redness erythema induration and tenderness. He is having some difficulty walking. There is minimal fluctuance. The area was prepped and anesthetized and aspirated 4 cc of serosanguineous fluid which was sent for culture.  Sternal incision appears to be healing well. He is maintained sinus rhythm. Right leg has edema and is larger than the left. There is no calf tenderness.   Past Medical History  Diagnosis Date  . CAD (coronary artery disease)   . Dyspnea   . Hypertension   . Hyperlipidemia   . Obesity   . Myocardial infarct   . Family history of anesthesia complication     mother had N/V after valve replacement    Past Surgical History  Procedure Laterality Date  . Ptca at [facility]      04/28/2005  . C-spine disc replacement x 2      06/16/2012  . Carpal tunnel release      on right  . Tonsillectomy    . Cardiac catheterization      last week @ [place]  . Cardiac stents      has been cathed x 4   Had  MI  in 2007  . Coronary artery bypass graft N/A 09/24/2013    Procedure: CORONARY ARTERY BYPASS GRAFTING (CABG) x three, using LIMA to LAD, and right leg greater saphenous vein harvested endoscopically - SVG to Ramus, SVG to PL;  Surgeon: [name], MD;  Location: [place];  Service: Open Heart Surgery;  Laterality: N/A;  . Intraoperative transesophageal echocardiogram N/A 09/24/2013    Procedure: INTRAOPERATIVE TRANSESOPHAGEAL ECHOCARDIOGRAM;  Surgeon: [name], MD;  Location: [place];  Service: Open Heart Surgery;  Laterality: N/A;    Family History  Problem Relation Age of Onset  . Hypertension Mother   . Hypertension Father   . Rheumatic  fever Mother     S/P MVR    Social History History  Substance Use Topics  . Smoking status: Former Smoker -- 2.00 packs/day    Types: Cigarettes    Quit date: 04/23/2005  . Smokeless tobacco: Never Used  . Alcohol Use: 3.6 oz/week    6 Cans of beer per week     Comment: 6 pk  a week    Current Outpatient Prescriptions  Medication Sig Dispense Refill  . amiodarone (PACERONE) 200 MG tablet Take 2 tablets (400 mg total) by mouth 2 (two) times daily. For 7 Days, then decrease to 200 mg twice a day for 7 days, then decrease to 200 mg daily  90 tablet  0  . aspirin EC 81 MG tablet Take 81 mg by mouth daily.      Marland Kitchen atorvastatin (LIPITOR) 80 MG tablet Take 80 mg by mouth daily.       . digoxin (LANOXIN) 0.125 MG tablet Take 1 tablet (0.125 mg total) by mouth daily.  30 tablet  3  . ferrous [id number] C-folic acid (TRINSICON / FOLTRIN) capsule Take 1 capsule by mouth 2 (two) times daily after a meal.  60 capsule  0  . furosemide (LASIX) 40 MG tablet Take 1 tablet (40 mg total) by  mouth daily. For 7 Days  7 tablet  0  . glucose blood test strip Use as instructed  100 each  12  . glucose monitoring kit (FREESTYLE) monitoring kit 1 each by Does not apply route as needed for other.  1 each  0  . Lancets (FREESTYLE) lancets Use as instructed  100 each  12  . lisinopril (PRINIVIL,ZESTRIL) 5 MG tablet Take 1 tablet (5 mg total) by mouth daily.  30 tablet  3  . metFORMIN (GLUCOPHAGE) 500 MG tablet Take 1 tablet (500 mg total) by mouth 2 (two) times daily with a meal.  60 tablet  3  . metoprolol (LOPRESSOR) 50 MG tablet Take 50 mg by mouth 2 (two) times daily.       Marland Kitchen oxyCODONE (OXY IR/ROXICODONE) 5 MG immediate release tablet Take 1-2 tablets (5-10 mg total) by mouth every 3 (three) hours as needed for moderate pain.  30 tablet  0  . potassium chloride SA (K-DUR,KLOR-CON) 20 MEQ tablet Take 1 tablet (20 mEq total) by mouth daily.  7 tablet  0  . warfarin (COUMADIN) 5 MG tablet Take 1 tablet  (5 mg total) by mouth daily at 6 PM.  30 tablet  3   No current facility-administered medications for this visit.    No Known Allergies  Review of Systems doing well until he developed cellulitis of his right leg site. Patient had an INR checked at his cardiologist office in [place] and was told to stay on the same dose.  BP 136/74  Pulse 62  Temp(Src) 97.1 F (36.2 C) (Oral)  Resp 20  Ht 6' (1.829 m)  Wt 300 lb (136.079 kg)  BMI 40.68 kg/m2  SpO2 94% Physical Exam Alert and oriented having pain in his leg Lungs clear Heart rhythm regular, sternal incision healing well Moderate edema in the right leg 3-4 cm area induration and erythema and tenderness around theEVH incision     Diagnostic Tests:   Impression: Cellulitis of his leg EVH harvest site   PlanWill place patient on oral Keflex and doxycycline We'll given refill for his Lasix as well as 2 doses of Zaroxolyn 1 refill for his oxycodone was provided He'll return to the clinic on Friday, July 10 for followup wound check:

## 2013-10-07 ENCOUNTER — Encounter: Payer: Self-pay | Admitting: Cardiovascular Disease

## 2013-10-08 ENCOUNTER — Ambulatory Visit (INDEPENDENT_AMBULATORY_CARE_PROVIDER_SITE_OTHER): Payer: Self-pay | Admitting: Physician Assistant

## 2013-10-08 VITALS — BP 111/60 | HR 67 | Resp 20 | Ht 72.0 in | Wt 300.0 lb

## 2013-10-08 DIAGNOSIS — L02818 Cutaneous abscess of other sites: Secondary | ICD-10-CM

## 2013-10-08 DIAGNOSIS — Z951 Presence of aortocoronary bypass graft: Secondary | ICD-10-CM

## 2013-10-08 DIAGNOSIS — L03119 Cellulitis of unspecified part of limb: Secondary | ICD-10-CM

## 2013-10-08 DIAGNOSIS — L02419 Cutaneous abscess of limb, unspecified: Secondary | ICD-10-CM

## 2013-10-08 DIAGNOSIS — I2510 Atherosclerotic heart disease of native coronary artery without angina pectoris: Secondary | ICD-10-CM

## 2013-10-08 DIAGNOSIS — L03115 Cellulitis of right lower limb: Secondary | ICD-10-CM

## 2013-10-08 DIAGNOSIS — L03818 Cellulitis of other sites: Secondary | ICD-10-CM

## 2013-10-08 DIAGNOSIS — L0390 Cellulitis, unspecified: Secondary | ICD-10-CM | POA: Insufficient documentation

## 2013-10-08 NOTE — Progress Notes (Signed)
HPI: Patient returns for wound check.  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  He presented to our office on 10/06/2013 with complaints of redness and pain in his right leg at the site of his vein harvest.  The patient also complained it was painful to ambulate.  He underwent aspiration with fluid sent for culture.  He was also started on ABX.  Finally he was noted to be edematous in his lower extremities and was given additional Lasix and 2 days of Zaroxolyn.  The patient presents today for wound check.  He states that overall the wound is doing better.  He states that it is not as painful or as red.  He states his swelling has also improved and the second medication added really helped removed some fluid.  He denies any drainage from the wound and has not been experiencing fevers.  Current Outpatient Prescriptions  Medication Sig Dispense Refill  . amiodarone (PACERONE) 200 MG tablet Take 2 tablets (400 mg total) by mouth 2 (two) times daily. For 7 Days, then decrease to 200 mg twice a day for 7 days, then decrease to 200 mg daily  90 tablet  0  . aspirin EC 81 MG tablet Take 81 mg by mouth daily.      Marland Kitchen atorvastatin (LIPITOR) 80 MG tablet Take 80 mg by mouth daily.       . digoxin (LANOXIN) 0.125 MG tablet Take 1 tablet (0.125 mg total) by mouth daily.  30 tablet  3  . ferrous [id number] C-folic acid (TRINSICON / FOLTRIN) capsule Take 1 capsule by mouth 2 (two) times daily after a meal.  60 capsule  0  . furosemide (LASIX) 40 MG tablet Take 1 tablet (40 mg total) by mouth daily. For 7 Days  7 tablet  0  . glucose blood test strip Use as instructed  100 each  12  . glucose monitoring kit (FREESTYLE) monitoring kit 1 each by Does not apply route as needed for other.  1 each  0  . Lancets (FREESTYLE) lancets Use as instructed  100 each  12  . lisinopril (PRINIVIL,ZESTRIL) 5 MG tablet Take 1 tablet (5 mg total) by mouth daily.  30 tablet  3  . metFORMIN (GLUCOPHAGE) 500 MG tablet Take 1  tablet (500 mg total) by mouth 2 (two) times daily with a meal.  60 tablet  3  . metoprolol (LOPRESSOR) 50 MG tablet Take 50 mg by mouth 2 (two) times daily.       Marland Kitchen oxyCODONE (OXY IR/ROXICODONE) 5 MG immediate release tablet Take 1-2 tablets (5-10 mg total) by mouth every 3 (three) hours as needed for moderate pain.  30 tablet  0  . potassium chloride SA (K-DUR,KLOR-CON) 20 MEQ tablet Take 1 tablet (20 mEq total) by mouth daily.  7 tablet  0  . warfarin (COUMADIN) 5 MG tablet Take 1 tablet (5 mg total) by mouth daily at 6 PM.  30 tablet  3   No current facility-administered medications for this visit.    Physical Exam:  Ext:  EVH site is erythematous along superior portion.  There is a hardened area up his thigh which is likely retained hematoma.  There is no purulence present.  The area is mildly warm to touch.  Diagnostic Tests:  Wound culture:  Official results are not fully completed, however there has been no growth for the past 2 days  A/P:  1. Improving RLE Cellulitis- continue Kelfex and Doxycycline, we will notify patient  should wound culture become positive and ABX regimen needs adjusted 2. LE edema- improving, continue daily Lasix regimen 3. DM- patient states sugars are well controlled running 100-120, I stressed the importance of maintaining this good glucose control for adequate wound healing 4. RTC on 10/18/2013 for routine post operative follow up.  Patient will contact our office should his wound get worse prior to that appointment

## 2013-10-09 LAB — WOUND CULTURE
Gram Stain: NONE SEEN
Gram Stain: NONE SEEN
Organism ID, Bacteria: NO GROWTH

## 2013-10-18 ENCOUNTER — Other Ambulatory Visit: Payer: Self-pay | Admitting: Cardiothoracic Surgery

## 2013-10-18 DIAGNOSIS — I4891 Unspecified atrial fibrillation: Secondary | ICD-10-CM

## 2013-10-20 ENCOUNTER — Ambulatory Visit
Admission: RE | Admit: 2013-10-20 | Discharge: 2013-10-20 | Disposition: A | Discharge status: Home / Self Care | Payer: BC Managed Care – PPO | Source: Ambulatory Visit | Attending: Cardiothoracic Surgery | Admitting: Cardiothoracic Surgery

## 2013-10-20 ENCOUNTER — Inpatient Hospital Stay (HOSPITAL_COMMUNITY)
Admission: AD | Admit: 2013-10-20 | Discharge: 2013-10-26 | DRG: 857 | Disposition: A | Discharge status: Home / Self Care | Payer: BC Managed Care – PPO | Source: Ambulatory Visit | Attending: Cardiothoracic Surgery | Admitting: Cardiothoracic Surgery

## 2013-10-20 ENCOUNTER — Ambulatory Visit (INDEPENDENT_AMBULATORY_CARE_PROVIDER_SITE_OTHER): Payer: Self-pay | Admitting: Physician Assistant

## 2013-10-20 VITALS — BP 121/77 | HR 70 | Resp 20 | Ht 72.0 in | Wt 300.0 lb

## 2013-10-20 DIAGNOSIS — I2510 Atherosclerotic heart disease of native coronary artery without angina pectoris: Secondary | ICD-10-CM

## 2013-10-20 DIAGNOSIS — L03119 Cellulitis of unspecified part of limb: Secondary | ICD-10-CM

## 2013-10-20 DIAGNOSIS — E669 Obesity, unspecified: Secondary | ICD-10-CM | POA: Diagnosis present

## 2013-10-20 DIAGNOSIS — Z9861 Coronary angioplasty status: Secondary | ICD-10-CM

## 2013-10-20 DIAGNOSIS — I252 Old myocardial infarction: Secondary | ICD-10-CM

## 2013-10-20 DIAGNOSIS — Z87891 Personal history of nicotine dependence: Secondary | ICD-10-CM

## 2013-10-20 DIAGNOSIS — E119 Type 2 diabetes mellitus without complications: Secondary | ICD-10-CM | POA: Diagnosis present

## 2013-10-20 DIAGNOSIS — Z7901 Long term (current) use of anticoagulants: Secondary | ICD-10-CM

## 2013-10-20 DIAGNOSIS — Z79899 Other long term (current) drug therapy: Secondary | ICD-10-CM

## 2013-10-20 DIAGNOSIS — L02419 Cutaneous abscess of limb, unspecified: Secondary | ICD-10-CM | POA: Diagnosis present

## 2013-10-20 DIAGNOSIS — I4891 Unspecified atrial fibrillation: Secondary | ICD-10-CM | POA: Diagnosis present

## 2013-10-20 DIAGNOSIS — K649 Unspecified hemorrhoids: Secondary | ICD-10-CM | POA: Diagnosis present

## 2013-10-20 DIAGNOSIS — L03115 Cellulitis of right lower limb: Secondary | ICD-10-CM

## 2013-10-20 DIAGNOSIS — L0390 Cellulitis, unspecified: Secondary | ICD-10-CM | POA: Diagnosis present

## 2013-10-20 DIAGNOSIS — E785 Hyperlipidemia, unspecified: Secondary | ICD-10-CM | POA: Diagnosis present

## 2013-10-20 DIAGNOSIS — I10 Essential (primary) hypertension: Secondary | ICD-10-CM | POA: Diagnosis present

## 2013-10-20 DIAGNOSIS — Z951 Presence of aortocoronary bypass graft: Secondary | ICD-10-CM

## 2013-10-20 DIAGNOSIS — Y838 Other surgical procedures as the cause of abnormal reaction of the patient, or of later complication, without mention of misadventure at the time of the procedure: Secondary | ICD-10-CM | POA: Diagnosis present

## 2013-10-20 DIAGNOSIS — T8140XA Infection following a procedure, unspecified, initial encounter: Principal | ICD-10-CM | POA: Diagnosis present

## 2013-10-20 LAB — CBC
HCT: 32.4 % — ABNORMAL LOW (ref 39.0–52.0)
Hemoglobin: 10 g/dL — ABNORMAL LOW (ref 13.0–17.0)
MCH: 27.5 pg (ref 26.0–34.0)
MCHC: 30.9 g/dL (ref 30.0–36.0)
MCV: 89 fL (ref 78.0–100.0)
PLATELETS: 439 10*3/uL — AB (ref 150–400)
RBC: 3.64 MIL/uL — AB (ref 4.22–5.81)
RDW: 14.2 % (ref 11.5–15.5)
WBC: 7.7 10*3/uL (ref 4.0–10.5)

## 2013-10-20 LAB — BASIC METABOLIC PANEL
ANION GAP: 13 (ref 5–15)
BUN: 12 mg/dL (ref 6–23)
CO2: 26 mEq/L (ref 19–32)
Calcium: 9 mg/dL (ref 8.4–10.5)
Chloride: 99 mEq/L (ref 96–112)
Creatinine, Ser: 0.86 mg/dL (ref 0.50–1.35)
Glucose, Bld: 145 mg/dL — ABNORMAL HIGH (ref 70–99)
POTASSIUM: 4.1 meq/L (ref 3.7–5.3)
Sodium: 138 mEq/L (ref 137–147)

## 2013-10-20 LAB — PROTIME-INR
INR: 5.78 — AB (ref 0.00–1.49)
PROTHROMBIN TIME: 51.9 s — AB (ref 11.6–15.2)

## 2013-10-20 LAB — GLUCOSE, CAPILLARY
Glucose-Capillary: 147 mg/dL — ABNORMAL HIGH (ref 70–99)
Glucose-Capillary: 100 mg/dL — ABNORMAL HIGH (ref 70–99)

## 2013-10-20 MED ORDER — DIGOXIN 125 MCG PO TABS
0.1250 mg | ORAL_TABLET | Freq: Every day | ORAL | Status: DC
  Filled 2013-10-20 (×2): qty 1

## 2013-10-20 MED ORDER — ONDANSETRON HCL 4 MG/2ML IJ SOLN: 4.0000 mg | Freq: Four times a day (QID) | INTRAMUSCULAR | Status: DC | PRN

## 2013-10-20 MED ORDER — OXYCODONE HCL 5 MG PO TABS: 5.0000 mg | ORAL_TABLET | ORAL | Status: DC | PRN

## 2013-10-20 MED ORDER — SODIUM CHLORIDE 0.9 % IJ SOLN
3.0000 mL | Freq: Two times a day (BID) | INTRAMUSCULAR | Status: DC
  Administered 2013-10-20 – 2013-10-25 (×9): 3 mL via INTRAVENOUS

## 2013-10-20 MED ORDER — DOCUSATE SODIUM 100 MG PO CAPS
100.0000 mg | ORAL_CAPSULE | Freq: Every day | ORAL | Status: DC
  Administered 2013-10-20 – 2013-10-25 (×6): 100 mg via ORAL
  Filled 2013-10-20 (×7): qty 1

## 2013-10-20 MED ORDER — VITAMIN K1 10 MG/ML IJ SOLN
2.0000 mg | Freq: Once | INTRAVENOUS | Status: AC
  Administered 2013-10-20: 2 mg via INTRAVENOUS
  Filled 2013-10-20: qty 0.2

## 2013-10-20 MED ORDER — ACETAMINOPHEN 650 MG RE SUPP: 650.0000 mg | Freq: Four times a day (QID) | RECTAL | Status: DC | PRN

## 2013-10-20 MED ORDER — VANCOMYCIN HCL 10 G IV SOLR
1250.0000 mg | Freq: Two times a day (BID) | INTRAVENOUS | Status: DC
  Administered 2013-10-21 – 2013-10-26 (×10): 1250 mg via INTRAVENOUS
  Filled 2013-10-20 (×12): qty 1250

## 2013-10-20 MED ORDER — METFORMIN HCL 500 MG PO TABS
500.0000 mg | ORAL_TABLET | Freq: Two times a day (BID) | ORAL | Status: DC
  Administered 2013-10-21 – 2013-10-26 (×11): 500 mg via ORAL
  Filled 2013-10-20 (×14): qty 1

## 2013-10-20 MED ORDER — METOPROLOL TARTRATE 50 MG PO TABS
50.0000 mg | ORAL_TABLET | Freq: Two times a day (BID) | ORAL | Status: DC
  Administered 2013-10-20 – 2013-10-25 (×11): 50 mg via ORAL
  Filled 2013-10-20 (×14): qty 1

## 2013-10-20 MED ORDER — ASPIRIN EC 325 MG PO TBEC
325.0000 mg | DELAYED_RELEASE_TABLET | Freq: Every day | ORAL | Status: DC
  Administered 2013-10-21 – 2013-10-25 (×5): 325 mg via ORAL
  Filled 2013-10-20 (×7): qty 1

## 2013-10-20 MED ORDER — ACETAMINOPHEN 325 MG PO TABS
650.0000 mg | ORAL_TABLET | Freq: Four times a day (QID) | ORAL | Status: DC | PRN
  Administered 2013-10-23 – 2013-10-25 (×7): 650 mg via ORAL
  Filled 2013-10-20 (×7): qty 2

## 2013-10-20 MED ORDER — INSULIN ASPART 100 UNIT/ML ~~LOC~~ SOLN
0.0000 [IU] | Freq: Three times a day (TID) | SUBCUTANEOUS | Status: DC
Start: 2013-10-20 — End: 2013-10-26

## 2013-10-20 MED ORDER — ATORVASTATIN CALCIUM 80 MG PO TABS
80.0000 mg | ORAL_TABLET | Freq: Every day | ORAL | Status: DC
  Administered 2013-10-21 – 2013-10-25 (×6): 80 mg via ORAL
  Filled 2013-10-20 (×7): qty 1

## 2013-10-20 MED ORDER — FE FUMARATE-B12-VIT C-FA-IFC PO CAPS
1.0000 | ORAL_CAPSULE | Freq: Every day | ORAL | Status: DC
  Administered 2013-10-21 – 2013-10-25 (×5): 1 via ORAL
  Filled 2013-10-20 (×7): qty 1

## 2013-10-20 MED ORDER — VANCOMYCIN HCL 10 G IV SOLR
2000.0000 mg | Freq: Once | INTRAVENOUS | Status: AC
  Administered 2013-10-20: 2000 mg via INTRAVENOUS
  Filled 2013-10-20: qty 2000

## 2013-10-20 MED ORDER — SODIUM CHLORIDE 0.9 % IV SOLN: 250.0000 mL | INTRAVENOUS | Status: DC | PRN

## 2013-10-20 MED ORDER — OXYCODONE HCL 5 MG PO TABS
5.0000 mg | ORAL_TABLET | ORAL | Status: DC | PRN
  Administered 2013-10-20 – 2013-10-26 (×20): 5 mg via ORAL
  Filled 2013-10-20 (×20): qty 1

## 2013-10-20 MED ORDER — ONDANSETRON HCL 4 MG PO TABS
4.0000 mg | ORAL_TABLET | Freq: Four times a day (QID) | ORAL | Status: DC | PRN
  Administered 2013-10-26: 4 mg via ORAL
  Filled 2013-10-20: qty 1

## 2013-10-20 MED ORDER — LISINOPRIL 5 MG PO TABS
5.0000 mg | ORAL_TABLET | Freq: Every day | ORAL | Status: DC
  Administered 2013-10-21 – 2013-10-25 (×5): 5 mg via ORAL
  Filled 2013-10-20 (×7): qty 1

## 2013-10-20 MED ORDER — SODIUM CHLORIDE 0.9 % IJ SOLN: 3.0000 mL | INTRAMUSCULAR | Status: DC | PRN

## 2013-10-20 NOTE — Progress Notes (Addendum)
ANTIBIOTIC CONSULT NOTE - INITIAL  Pharmacy Consult for Vancomycin Indication: wound infection (cellulitis at [facility] site)  No Known Allergies  Patient Measurements: Weight: 136.1 kg from 10/20/13 Height: 183 cm from 10/20/13 IBW: 77.6 kg Adjusted Body Weight: 101 kg  Vital Signs: BP: 121/77 mmHg (07/22 1304) Pulse Rate: 70 (07/22 1304) Labs: No results found for this basename: WBC, HGB, PLT, LABCREA, CREATININE,  in the last 72 hours The CrCl is unknown because both a height and weight (above a minimum accepted value) are required for this calculation. No results found for this basename: VANCOTROUGH, Corlis Leak, VANCORANDOM, GENTTROUGH, GENTPEAK, GENTRANDOM, TOBRATROUGH, TOBRAPEAK, TOBRARND, AMIKACINPEAK, AMIKACINTROU, AMIKACIN,  in the last 72 hours   Microbiology: Recent Results (from the past 720 hour(s))  SURGICAL PCR SCREEN     Status: None   Collection Time    09/22/13  9:37 AM      Result Value Ref Range Status   MRSA, PCR NEGATIVE  NEGATIVE Final   Staphylococcus aureus NEGATIVE  NEGATIVE Final   Comment:            The Xpert SA Assay (FDA     approved for NASAL specimens     in patients over 39 years of age),     is one component of     a comprehensive surveillance     program.  Test performance has     been validated by [employer] for patients greater     than or equal to 51 year old.     It is not intended     to diagnose infection nor to     guide or monitor treatment.    Medical History: Past Medical History  Diagnosis Date  . CAD (coronary artery disease)   . Dyspnea   . Hypertension   . Hyperlipidemia   . Obesity   . Myocardial infarct   . Family history of anesthesia complication     mother had N/V after valve replacement    Medications:  Keflex and Doxycycline for cellulitis as outpatient  Assessment: 53 YOM s/p CABG on 09/24/13 seen as outpatient for redness and swelling around [facility] site and lower extremity edema, given Keflex and  Doxycycline for cellulitis as outpatient, now admitted for continued cellulitis of RLE to start Vancomycin. Patient is obesity and diabetic.   Scr from last admission bumped post-operatively but improved down to 0.84 on 09/29/13. Note patient on Metformin & Lisinopril PTA. Awaiting baseline labs.   Goal of Therapy:  Vancomycin trough level 10-15 mcg/ml  Plan:  1. Vancomycin loading dose of 2000mg  x1. 2. Will follow-up labs to determine further dosing.   Sloan Leiter, PharmD, BCPS Clinical Pharmacist [phone] 10/20/2013,4:14 PM    SCr 0.86 (CrCl > 100): will start vanc 1250mg  iv q12h, 1st dose will be 12 hours after the loading dose is given.  Tsz-Yin Arma Reining, PharmD, BCPS

## 2013-10-20 NOTE — Progress Notes (Signed)
  HPI:  Patient returns for routine postoperative follow-up having undergone a CABG x 3 on 09/24/2013. The patient's early postoperative recovery while in the hospital was notable for atrial fibrillation. He has been seen in follow up by Dr. Prescott Gum for swelling and redness around [facility] site as well as lower extremity edema on 10/06/2013. He was placed on Keflex and Doxycycline for the cellulitis and Lasix and Zaroxolyn for the edema. He then returned to see the physician assistant on 10/08/2013 where the cellulitis seemed to be improving. The patient continues to report redness and tenderness above and below right EVH wound. He denies any fever or chills.   Current Outpatient Prescriptions  Medication Sig Dispense Refill  . aspirin EC 81 MG tablet Take 81 mg by mouth daily.      Marland Kitchen atorvastatin (LIPITOR) 80 MG tablet Take 80 mg by mouth daily.       . digoxin (LANOXIN) 0.125 MG tablet Take 1 tablet (0.125 mg total) by mouth daily.  30 tablet  3  . glucose blood test strip Use as instructed  100 each  12  . glucose monitoring kit (FREESTYLE) monitoring kit 1 each by Does not apply route as needed for other.  1 each  0  . Lancets (FREESTYLE) lancets Use as instructed  100 each  12  . lisinopril (PRINIVIL,ZESTRIL) 5 MG tablet Take 1 tablet (5 mg total) by mouth daily.  30 tablet  3  . metFORMIN (GLUCOPHAGE) 500 MG tablet Take 1 tablet (500 mg total) by mouth 2 (two) times daily with a meal.  60 tablet  3  . metoprolol (LOPRESSOR) 50 MG tablet Take 50 mg by mouth 2 (two) times daily.       Marland Kitchen oxyCODONE (OXY IR/ROXICODONE) 5 MG immediate release tablet Take 1-2 tablets (5-10 mg total) by mouth every 3 (three) hours as needed for moderate pain.  30 tablet  0  . warfarin (COUMADIN) 5 MG tablet Take 1 tablet (5 mg total) by mouth daily at 6 PM.  30 tablet  3   No current facility-administered medications for this visit.    Physical Exam:  Cardiovascular RRR, no murmur Pulmonary Clear to auscultation on  right;diminished left base Abdomen Soft, NT, bowel sounds present Extremities No cyanosis, clubbing, trace RLE edema Wounds Sternal wound is clean and dry;RLE wound has erythema and firmness. No drainage noted.  Diagnostic Tests: EXAM:  CHEST 2 VIEW  COMPARISON: September 29, 2013.  FINDINGS:  Stable cardiomediastinal silhouette. Status post surgical fusion of  lower cervical spine. Status post coronary artery bypass graft. Mild  left pleural effusion is noted. No acute pulmonary disease is noted.  No pneumothorax is noted. Bony thorax is intact.  IMPRESSION:  Mild left pleural effusion. No other significant abnormality seen in  the chest.  Electronically Signed  By: Sabino Dick M.D.  On: 10/20/2013 12:57   Impression: 1.Cellulitis of the right lower extremity 2. S/p CABG x 3 09/24/2013  Plan: Admission to [facility] for IV Vancomycin, LE duplex US, and possible I and D of RLE wound.

## 2013-10-20 NOTE — Progress Notes (Signed)
CRITICAL VALUE ALERT  Critical value received:  INR-5.78  Date of notification:  10/20/13  Time of notification:  2020  Critical value read back:Yes.    Nurse who received alert:  Tory Emerald RN  MD notified (1st page):  Dr. Cyndia Bent  Time of first page:  2040  MD notified (2nd page):  Time of second page:  Responding MD:  Dr. Cyndia Bent  Time MD responded:  2045  Order from Dr. Cyndia Bent for 58m Vit. K IV

## 2013-10-20 NOTE — H&P (Signed)
[address]            [place] [address]          [phone]     Nathan Hull is an 53 y.o. male. [birth year] [id number]   Chief Complaint: Cellulitis right lower extremity  History of Presenting Illness:  Patient returns to the office today for postoperative follow-up having undergone a CABG x 3 on 09/24/2013.  The patient's early postoperative recovery while in the hospital was notable for atrial fibrillation. He has seen Dr. Humphrey Rolls in follow up. He is no longer taking Amiodarone but states he is still on Lopressor.Coumadin, and Digoxin. He has been seen in follow up by Dr. Prescott Gum for swelling and redness around [facility] site as well as lower extremity edema on 10/06/2013. He was placed on Keflex and Doxycycline for the cellulitis and Lasix and Zaroxolyn for the edema. He then returned to see the physician assistant on 10/08/2013 where the cellulitis seemed to be improving. The patient continues to report redness and tenderness above and below right EVH wound. He denies any fever or chills.    Past Medical History: Past Medical History  Diagnosis Date  . CAD (coronary artery disease)   . Dyspnea   . Hypertension   . Hyperlipidemia   . Obesity   . Myocardial infarct   . Family history of anesthesia complication     mother had N/V after valve replacement  Post op atrial fibrillation  Past Surgical History: Past Surgical History  Procedure Laterality Date  . Ptca at [facility]      04/28/2005  . C-spine disc replacement x 2      06/16/2012  . Carpal tunnel release      on right  . Tonsillectomy    . Cardiac catheterization      last week @ [place]  . Cardiac stents      has been cathed x 4   Had  MI  in 2007  . Coronary artery bypass graft N/A 09/24/2013    Procedure: CORONARY ARTERY BYPASS GRAFTING (CABG) x three, using LIMA to LAD, and right leg greater saphenous vein harvested endoscopically - SVG to Ramus, SVG to PL;  Surgeon: [name], MD;   Location: [place];  Service: Open Heart Surgery;  Laterality: N/A;  . Intraoperative transesophageal echocardiogram N/A 09/24/2013    Procedure: INTRAOPERATIVE TRANSESOPHAGEAL ECHOCARDIOGRAM;  Surgeon: [name], MD;  Location: [place];  Service: Open Heart Surgery;  Laterality: N/A;    Family History: Family History  Problem Relation Age of Onset  . Hypertension Mother   . Hypertension Father   . Rheumatic fever Mother     S/P MVR     Social History:   reports that he quit smoking about 8 years ago. His smoking use included Cigarettes. He smoked 2.00 packs per day. He has never used smokeless tobacco. He reports that he drinks about 3.6 ounces of alcohol per week. He reports that he does not use illicit drugs.  Allergies:  No Known Allergies   Review of Systems:  Cardiac Review of Systems: Y or N  Chest Pain [ N ] Resting SOB [ N ] Exertional SOB [ N ] Orthopnea [ N]  Pedal Edema Aqua.Slicker ] Palpitations Aqua.Slicker ] Syncope Aqua.Slicker ] Presyncope [ N ]  General Review of Systems: [Y] = yes [ ] =no  Constitional:  recent weight change Aqua.Slicker ]; anorexia [ N]; fatigue [ N]; nausea [ N]; night sweats Aqua.Slicker ]; fever Aqua.Slicker ]; or chills [ N];  Eye : blurred vision [ N]; diplopia [N ]; vision changes [N]; Amaurosis fugax[ N];  Resp: cough Aqua.Slicker ]; wheezing[N ]; hemoptysis[N ] GI: vomiting[ N]; dysphagia[N ]; melena[N ]; hematochezia Aqua.Slicker ]; heartburn[ N] GU: kidney stones [ N]; hematuria[ N]; dysuria Aqua.Slicker ]; nocturia[ N]; history of obstruction [ N];  Skin: rash, swelling[Y ];, hair loss[N ]; peripheral edema[ N]; or itching[N ];  Musculosketetal: myalgias[ N]; joint swelling[ N]; joint erythema[ N]; joint pain[N ]; back pain[ N];  Heme/Lymph: bruising[ N]; bleeding[N ]; anemia[ N];  Neuro: TIA[ N]; headaches[ N]; stroke[N ]; vertigo[ N]; seizures[ ] N; paresthesias[ N]; difficulty walking[ N];  Psych:depression[N ]; anxiety[ N];  Endocrine: diabetes[ Y]; thyroid dysfunction[N ];    Physical Exam: General appearance:  alert, cooperative and no distress HEENT Head is atraumatic Eyes EOMI, PERRLA, sclear non icteric Neck supple, no JVD Heart: regular rate and rhythm, S1, S2 normal, no murmur, click, rub or gallop Lungs: Clear on the right and diminished on the left Abdomen: soft, non-tender; bowel sounds normal; no masses,  no organomegaly Extremities: Minor RLE edema Wound: Sternal wound is clean and dry. No signs of infection. Left chest tube wound with eschar. RLE with erythema, tenderness, and swelling. No purulence Neurologic: intact   Diagnostic Studies and Laboratory Results: No results found for this or any previous visit (from the past 48 hour(s)). Dg Chest 2 View  10/20/2013   CLINICAL DATA:  Atrial fibrillation.  EXAM: CHEST  2 VIEW  COMPARISON:  September 29, 2013.  FINDINGS: Stable cardiomediastinal silhouette. Status post surgical fusion of lower cervical spine. Status post coronary artery bypass graft. Mild left pleural effusion is noted. No acute pulmonary disease is noted. No pneumothorax is noted. Bony thorax is intact.  IMPRESSION: Mild left pleural effusion. No other significant abnormality seen in the chest.   Electronically Signed   By: Sabino Dick M.D.   On: 10/20/2013 12:57     Assessment/Plan 1. Cellulitis of the RLE-start IV Vanco 2. S/p CABG x 3 09/24/2013. Maintaining sinus rhythm. Will not restart Coumadin at this time. Will discuss if may discontinue completely with Dr. Humphrey Rolls in am. 3. DM-accu checks, Metformin 4. Duplex US of LE to rule out DVT  ZIMMERMAN,DONIELLE M, PA-C 10/20/2013, 3:41 PM     Will check [place] for "any pockets" for drainage, consider debridement Friday and vac  I have seen and examined Nathan Hull and agree with the above assessment  and plan.  [name] MD Beeper [phone] Office [phone] 10/20/2013 6:15 PM

## 2013-10-21 ENCOUNTER — Encounter (HOSPITAL_COMMUNITY): Payer: Self-pay | Admitting: *Deleted

## 2013-10-21 DIAGNOSIS — M7989 Other specified soft tissue disorders: Secondary | ICD-10-CM

## 2013-10-21 LAB — PROTIME-INR
INR: 4.59 — AB (ref 0.00–1.49)
INR: 2.2 — ABNORMAL HIGH (ref 0.00–1.49)
Prothrombin Time: 43.4 seconds — ABNORMAL HIGH (ref 11.6–15.2)
Prothrombin Time: 24.4 seconds — ABNORMAL HIGH (ref 11.6–15.2)

## 2013-10-21 LAB — GLUCOSE, CAPILLARY
Glucose-Capillary: 95 mg/dL (ref 70–99)
Glucose-Capillary: 102 mg/dL — ABNORMAL HIGH (ref 70–99)
Glucose-Capillary: 93 mg/dL (ref 70–99)
Glucose-Capillary: 80 mg/dL (ref 70–99)

## 2013-10-21 NOTE — Progress Notes (Signed)
Patient ID: Nathan Hull, male   DOB: April 30, 1960, 53 y.o.   MRN: [id number] Reviewed [place] of leg, have recommend to patient we proceed with I.  The goals risks and alternatives of the planned surgical procedure &D of right leg vein harvest tunnel  have been discussed with the patient in detail. The risks of the procedure including death, infection, stroke, myocardial infarction, bleeding, blood transfusion have all been discussed specifically.  I have quoted Dario Guardian a 1 % of perioperative mortality and a complication rate as high as 10 %. The patient's questions have been answered.Dario Guardian is willing  to proceed with the planned procedure. [name] MD      [address] [place] [address] Office [phone]   Beeper [phone]

## 2013-10-21 NOTE — Progress Notes (Signed)
*  PRELIMINARY RESULTS* Vascular Ultrasound Lower extremity venous duplex has been completed.  Preliminary findings: No evidence of DVT bilaterally. At area of concern, medial right knee, there is a cystic area with mixed echoes extending from distal medial thigh to proximal medial calf. With patient history of vein harvest, unsure if this is a thrombosed segment of remaining GSV or fluid collection in area of previous GSV.  Landry Mellow, RDMS, RVT  10/21/2013, 9:32 AM

## 2013-10-21 NOTE — Progress Notes (Signed)
Utilization Review Completed.Donne Anon T7/23/2015

## 2013-10-21 NOTE — Progress Notes (Signed)
Patient ID: Nathan Hull, male   DOB: June 19, 1960, 53 y.o.   MRN: [id number]      [address]       [place] [address]             [phone]                   Procedure(s) (LRB): IRRIGATION AND DEBRIDEMENT EXTREMITY-RIGHT LEG (Right) APPLICATION OF WOUND VAC (Right)  LOS: 1 day   Subjective: Remains afebrile , says his leg feels better  Objective: Vital signs in last 24 hours: Patient Vitals for the past 24 hrs:  BP Temp Temp src Pulse Resp SpO2 Height Weight  10/21/13 0427 128/65 mmHg 98.2 F (36.8 C) Oral 75 18 92 % - 285 lb (129.275 kg)  10/20/13 2008 131/65 mmHg 98.2 F (36.8 C) Oral 83 20 97 % - -  10/20/13 1809 131/74 mmHg 98.4 F (36.9 C) - 79 18 98 % 6' (1.829 m) 283 lb 1.6 oz (128.413 kg)    Filed Weights   10/20/13 1809 10/21/13 0427  Weight: 283 lb 1.6 oz (128.413 kg) 285 lb (129.275 kg)    Hemodynamic parameters for last 24 hours:    Intake/Output from previous day: 07/22 0701 - 07/23 0700 In: 340 [P.O.:340] Out: 2050 [Urine:2050] Intake/Output this shift: Total I/O In: 240 [P.O.:240] Out: -   Scheduled Meds: . aspirin EC  325 mg Oral Daily  . atorvastatin  80 mg Oral q1800  . digoxin  0.125 mg Oral Daily  . docusate sodium  100 mg Oral Daily  . ferrous [id number] C-folic acid  1 capsule Oral Daily  . insulin aspart  0-15 Units Subcutaneous TID WC  . lisinopril  5 mg Oral Daily  . metFORMIN  500 mg Oral BID WC  . metoprolol tartrate  50 mg Oral BID  . sodium chloride  3 mL Intravenous Q12H  . vancomycin  1,250 mg Intravenous Q12H   Continuous Infusions:  PRN Meds:.sodium chloride, acetaminophen, acetaminophen, ondansetron (ZOFRAN) IV, ondansetron, oxyCODONE, sodium chloride  General appearance: alert and cooperative Neurologic: intact Heart: regular rate and rhythm, S1, S2 normal, no murmur, click, rub or gallop Lungs: clear to auscultation bilaterally Abdomen: soft, non-tender; bowel sounds normal; no masses,   no organomegaly Extremities: erythymia at right knee area , silimlar to exam yesterday Wound: sternum stable and well healed  Lab Results: CBC: Recent Labs  10/20/13 1855  WBC 7.7  HGB 10.0*  HCT 32.4*  PLT 439*   BMET:  Recent Labs  10/20/13 1855  NA 138  K 4.1  CL 99  CO2 26  GLUCOSE 145*  BUN 12  CREATININE 0.86  CALCIUM 9.0    PT/INR:  Recent Labs  10/21/13 0250  LABPROT 43.4*  INR 4.59*     Radiology Dg Chest 2 View  10/20/2013   CLINICAL DATA:  Atrial fibrillation.  EXAM: CHEST  2 VIEW  COMPARISON:  September 29, 2013.  FINDINGS: Stable cardiomediastinal silhouette. Status post surgical fusion of lower cervical spine. Status post coronary artery bypass graft. Mild left pleural effusion is noted. No acute pulmonary disease is noted. No pneumothorax is noted. Bony thorax is intact.  IMPRESSION: Mild left pleural effusion. No other significant abnormality seen in the chest.   Electronically Signed   By: Sabino Dick M.D.   On: 10/20/2013 12:57     Assessment/Plan: S/P Procedure(s) (LRB): IRRIGATION AND DEBRIDEMENT EXTREMITY-RIGHT LEG (Right) APPLICATION OF WOUND VAC (Right) Consider I&  D tomorrow, will repeat INR given vit k last pm [place] of area pending still   [name] MD 10/21/2013 1:35 PM

## 2013-10-21 NOTE — Progress Notes (Signed)
Pt stated that in dr. Gabriel Carina they discontinued digoxin, pt refused to take. Made dr. Servando Snare aware, ok to discontinue

## 2013-10-22 ENCOUNTER — Encounter (HOSPITAL_COMMUNITY): Payer: Self-pay | Admitting: Certified Registered"

## 2013-10-22 ENCOUNTER — Inpatient Hospital Stay (HOSPITAL_COMMUNITY): Payer: BC Managed Care – PPO | Admitting: Certified Registered"

## 2013-10-22 ENCOUNTER — Encounter (HOSPITAL_COMMUNITY)
Admission: AD | Disposition: A | Discharge status: Home / Self Care | Payer: Self-pay | Source: Ambulatory Visit | Attending: Cardiothoracic Surgery

## 2013-10-22 ENCOUNTER — Encounter (HOSPITAL_COMMUNITY): Payer: BC Managed Care – PPO | Admitting: Certified Registered"

## 2013-10-22 DIAGNOSIS — L03119 Cellulitis of unspecified part of limb: Secondary | ICD-10-CM

## 2013-10-22 DIAGNOSIS — L02419 Cutaneous abscess of limb, unspecified: Secondary | ICD-10-CM

## 2013-10-22 HISTORY — PX: I & D EXTREMITY: SHX5045

## 2013-10-22 HISTORY — PX: APPLICATION OF WOUND VAC: SHX5189

## 2013-10-22 LAB — COMPREHENSIVE METABOLIC PANEL
ALT: 12 U/L (ref 0–53)
AST: 10 U/L (ref 0–37)
Albumin: 3.2 g/dL — ABNORMAL LOW (ref 3.5–5.2)
Alkaline Phosphatase: 88 U/L (ref 39–117)
Anion gap: 14 (ref 5–15)
BUN: 10 mg/dL (ref 6–23)
CO2: 29 mEq/L (ref 19–32)
Calcium: 8.9 mg/dL (ref 8.4–10.5)
Chloride: 100 mEq/L (ref 96–112)
Creatinine, Ser: 0.89 mg/dL (ref 0.50–1.35)
GFR calc Af Amer: >90 mL/min (ref >90)
GFR calc non Af Amer: >90 mL/min (ref >90)
Glucose, Bld: 88 mg/dL (ref 70–99)
Potassium: 4.4 mEq/L (ref 3.7–5.3)
Sodium: 143 mEq/L (ref 137–147)
Total Bilirubin: 0.2 mg/dL — ABNORMAL LOW (ref 0.3–1.2)
Total Protein: 6.9 g/dL (ref 6.0–8.3)

## 2013-10-22 LAB — CBC
HCT: 31.7 % — ABNORMAL LOW (ref 39.0–52.0)
Hemoglobin: 9.5 g/dL — ABNORMAL LOW (ref 13.0–17.0)
MCH: 26.3 pg (ref 26.0–34.0)
MCHC: 30 g/dL (ref 30.0–36.0)
MCV: 87.8 fL (ref 78.0–100.0)
Platelets: 437 10*3/uL — ABNORMAL HIGH (ref 150–400)
RBC: 3.61 MIL/uL — ABNORMAL LOW (ref 4.22–5.81)
RDW: 14.1 % (ref 11.5–15.5)
WBC: 6.1 10*3/uL (ref 4.0–10.5)

## 2013-10-22 LAB — GLUCOSE, CAPILLARY
Glucose-Capillary: 95 mg/dL (ref 70–99)
Glucose-Capillary: 88 mg/dL (ref 70–99)
Glucose-Capillary: 113 mg/dL — ABNORMAL HIGH (ref 70–99)
Glucose-Capillary: 117 mg/dL — ABNORMAL HIGH (ref 70–99)
Glucose-Capillary: 162 mg/dL — ABNORMAL HIGH (ref 70–99)

## 2013-10-22 LAB — APTT: aPTT: 50 seconds — ABNORMAL HIGH (ref 24–37)

## 2013-10-22 LAB — PROTIME-INR
INR: 1.87 — ABNORMAL HIGH (ref 0.00–1.49)
Prothrombin Time: 21.5 seconds — ABNORMAL HIGH (ref 11.6–15.2)

## 2013-10-22 SURGERY — IRRIGATION AND DEBRIDEMENT EXTREMITY
Anesthesia: Monitor Anesthesia Care | Site: Leg Upper | Laterality: Right

## 2013-10-22 MED ORDER — FENTANYL CITRATE 0.05 MG/ML IJ SOLN
INTRAMUSCULAR | Status: AC
  Filled 2013-10-22: qty 5

## 2013-10-22 MED ORDER — LACTATED RINGERS IV SOLN
INTRAVENOUS | Status: DC | PRN
  Administered 2013-10-22: 07:00:00 via INTRAVENOUS

## 2013-10-22 MED ORDER — ROCURONIUM BROMIDE 50 MG/5ML IV SOLN
INTRAVENOUS | Status: AC
  Filled 2013-10-22: qty 1

## 2013-10-22 MED ORDER — PROPOFOL 10 MG/ML IV BOLUS
INTRAVENOUS | Status: AC
  Filled 2013-10-22: qty 20

## 2013-10-22 MED ORDER — LIDOCAINE HCL 1 % IJ SOLN
INTRAMUSCULAR | Status: DC | PRN
  Administered 2013-10-22: 7 mL via INTRADERMAL

## 2013-10-22 MED ORDER — LIDOCAINE HCL (CARDIAC) 20 MG/ML IV SOLN
INTRAVENOUS | Status: DC | PRN
  Administered 2013-10-22: 60 mg via INTRAVENOUS

## 2013-10-22 MED ORDER — MIDAZOLAM HCL 2 MG/2ML IJ SOLN
INTRAMUSCULAR | Status: AC
  Filled 2013-10-22: qty 2

## 2013-10-22 MED ORDER — SUCCINYLCHOLINE CHLORIDE 20 MG/ML IJ SOLN
INTRAMUSCULAR | Status: AC
  Filled 2013-10-22: qty 1

## 2013-10-22 MED ORDER — PROPOFOL INFUSION 10 MG/ML OPTIME
INTRAVENOUS | Status: DC | PRN
  Administered 2013-10-22: 75 ug/kg/min via INTRAVENOUS

## 2013-10-22 MED ORDER — LIDOCAINE HCL (PF) 1 % IJ SOLN
INTRAMUSCULAR | Status: AC
  Filled 2013-10-22: qty 30

## 2013-10-22 MED ORDER — HYDROMORPHONE HCL PF 1 MG/ML IJ SOLN
0.2500 mg | INTRAMUSCULAR | Status: DC | PRN
  Administered 2013-10-22 (×2): 0.5 mg via INTRAVENOUS

## 2013-10-22 MED ORDER — FENTANYL CITRATE 0.05 MG/ML IJ SOLN
INTRAMUSCULAR | Status: DC | PRN
  Administered 2013-10-22: 50 ug via INTRAVENOUS
  Administered 2013-10-22 (×4): 25 ug via INTRAVENOUS

## 2013-10-22 MED ORDER — ENOXAPARIN SODIUM 40 MG/0.4ML ~~LOC~~ SOLN
40.0000 mg | SUBCUTANEOUS | Status: DC
  Administered 2013-10-22 – 2013-10-25 (×4): 40 mg via SUBCUTANEOUS
  Filled 2013-10-22 (×6): qty 0.4

## 2013-10-22 MED ORDER — SODIUM CHLORIDE 0.9 % IJ SOLN
INTRAMUSCULAR | Status: AC
  Filled 2013-10-22: qty 10

## 2013-10-22 MED ORDER — HYDROMORPHONE HCL PF 1 MG/ML IJ SOLN
INTRAMUSCULAR | Status: AC
  Filled 2013-10-22: qty 1

## 2013-10-22 MED ORDER — MIDAZOLAM HCL 5 MG/5ML IJ SOLN
INTRAMUSCULAR | Status: DC | PRN
  Administered 2013-10-22: 2 mg via INTRAVENOUS

## 2013-10-22 SURGICAL SUPPLY — 35 items
BANDAGE ELASTIC 4 VELCRO ST LF (GAUZE/BANDAGES/DRESSINGS) IMPLANT
BANDAGE ELASTIC 6 VELCRO ST LF (GAUZE/BANDAGES/DRESSINGS) IMPLANT
BANDAGE GAUZE ELAST BULKY 4 IN (GAUZE/BANDAGES/DRESSINGS) IMPLANT
CANISTER SUCTION 2500CC (MISCELLANEOUS) ×2 IMPLANT
CANISTER WOUND CARE 500ML ATS (WOUND CARE) ×1 IMPLANT
CONT SPEC 4OZ CLIKSEAL STRL BL (MISCELLANEOUS) ×1 IMPLANT
COVER SURGICAL LIGHT HANDLE (MISCELLANEOUS) ×4 IMPLANT
DRSG VAC ATS SM SENSATRAC (GAUZE/BANDAGES/DRESSINGS) ×1 IMPLANT
ELECT REM PT RETURN 9FT ADLT (ELECTROSURGICAL) ×2
ELECTRODE REM PT RTRN 9FT ADLT (ELECTROSURGICAL) ×1 IMPLANT
GLOVE BIO SURGEON STRL SZ 6.5 (GLOVE) ×2 IMPLANT
GOWN STRL REUS W/ TWL LRG LVL3 (GOWN DISPOSABLE) ×2 IMPLANT
GOWN STRL REUS W/TWL LRG LVL3 (GOWN DISPOSABLE) ×4
KIT BASIN OR (CUSTOM PROCEDURE TRAY) ×2 IMPLANT
KIT ROOM TURNOVER OR (KITS) ×2 IMPLANT
NDL 18GX1X1/2 (RX/OR ONLY) (NEEDLE) IMPLANT
NDL HYPO 25GX1X1/2 BEV (NEEDLE) IMPLANT
NEEDLE 18GX1X1/2 (RX/OR ONLY) (NEEDLE) ×2 IMPLANT
NEEDLE HYPO 25GX1X1/2 BEV (NEEDLE) ×2 IMPLANT
NS IRRIG 1000ML POUR BTL (IV SOLUTION) ×2 IMPLANT
PACK GENERAL/GYN (CUSTOM PROCEDURE TRAY) ×2 IMPLANT
PACK UNIVERSAL I (CUSTOM PROCEDURE TRAY) ×2 IMPLANT
PAD ARMBOARD 7.5X6 YLW CONV (MISCELLANEOUS) ×4 IMPLANT
SPONGE GAUZE 4X4 12PLY (GAUZE/BANDAGES/DRESSINGS) ×2 IMPLANT
STAPLER VISISTAT 35W (STAPLE) IMPLANT
STOCKINETTE IMPERVIOUS 9X36 MD (GAUZE/BANDAGES/DRESSINGS) ×1 IMPLANT
SUT VIC AB 2-0 CTX 36 (SUTURE) IMPLANT
SUT VIC AB 3-0 SH 27 (SUTURE)
SUT VIC AB 3-0 SH 27X BRD (SUTURE) IMPLANT
SUT VIC AB 3-0 X1 27 (SUTURE) ×2 IMPLANT
SWAB COLLECTION DEVICE MRSA (MISCELLANEOUS) ×1 IMPLANT
TOWEL OR 17X24 6PK STRL BLUE (TOWEL DISPOSABLE) ×2 IMPLANT
TOWEL OR 17X26 10 PK STRL BLUE (TOWEL DISPOSABLE) ×2 IMPLANT
TUBE ANAEROBIC SPECIMEN COL (MISCELLANEOUS) ×2 IMPLANT
WATER STERILE IRR 1000ML POUR (IV SOLUTION) ×2 IMPLANT

## 2013-10-22 NOTE — Progress Notes (Signed)
Report given to maria rn as caregiver 

## 2013-10-22 NOTE — Anesthesia Preprocedure Evaluation (Addendum)
Anesthesia Evaluation  Patient identified by MRN, date of birth, ID band Patient awake    Reviewed: Allergy & Precautions, H&P , NPO status , Patient's Chart, lab work & pertinent test results, reviewed documented beta blocker date and time   History of Anesthesia Complications (+) DIFFICULT AIRWAY and Family history of anesthesia reaction  Airway Mallampati: III      Dental  (+) Teeth Intact, Dental Advisory Given   Pulmonary shortness of breath, former smoker,  breath sounds clear to auscultation        Cardiovascular hypertension, Pt. on medications + CAD and + Past MI Rhythm:Regular     Neuro/Psych    GI/Hepatic negative GI ROS, Neg liver ROS,   Endo/Other  negative endocrine ROS  Renal/GU negative Renal ROS     Musculoskeletal negative musculoskeletal ROS (+)   Abdominal (+)  Abdomen: soft. Bowel sounds: normal.  Peds  Hematology negative hematology ROS (+)   Anesthesia Other Findings   Reproductive/Obstetrics negative OB ROS                         Anesthesia Physical Anesthesia Plan  ASA: III  Anesthesia Plan: MAC   Post-op Pain Management:    Induction: Intravenous  Airway Management Planned: Simple Face Mask  Additional Equipment:   Intra-op Plan:   Post-operative Plan:   Informed Consent: I have reviewed the patients History and Physical, chart, labs and discussed the procedure including the risks, benefits and alternatives for the proposed anesthesia with the patient or authorized representative who has indicated his/her understanding and acceptance.   Dental advisory given  Plan Discussed with: CRNA and Anesthesiologist  Anesthesia Plan Comments:         Anesthesia Quick Evaluation

## 2013-10-22 NOTE — Clinical Documentation Improvement (Signed)
  Documentation of debridement needs to be descriptive enough to create a clear picture of procedure performed.   Please clarify the term 'debridement' by specifying in the progress notes:       Excisional vs. nonexcisional?      Type of instrument used?     Depth of Debridement/Type of tissue removed?  (e.g., skin, subcutaneous tissue, soft tissue, muscle,    bone)       Did the debridement extend outside or beyond the wound margins?          Irrigation of wound (e.g, Versajet)?      Thank You,   Erling Conte ,RN BSN CCDS Certified Clinical Documentation Specialist:  [facility]

## 2013-10-22 NOTE — Anesthesia Postprocedure Evaluation (Signed)
  Anesthesia Post-op Note  Patient: Nathan Hull  Procedure(s) Performed: Procedure(s): IRRIGATION AND DEBRIDEMENT EXTREMITY-RIGHT LEG (Right) APPLICATION OF WOUND VAC (Right)  Patient Location: PACU  Anesthesia Type:MAC  Level of Consciousness: awake  Airway and Oxygen Therapy: Patient Spontanous Breathing  Post-op Pain: mild  Post-op Assessment: Post-op Vital signs reviewed  Post-op Vital Signs: Reviewed  Last Vitals:  Filed Vitals:   10/22/13 0830  BP: 141/66  Pulse: 67  Temp:   Resp: 13    Complications: No apparent anesthesia complications

## 2013-10-22 NOTE — Anesthesia Procedure Notes (Signed)
Procedure Name: MAC Date/Time: 10/22/2013 7:30 AM Performed by: Maeola Harman Pre-anesthesia Checklist: Patient identified, Emergency Drugs available, Suction available, Patient being monitored and Timeout performed Patient Re-evaluated:Patient Re-evaluated prior to inductionOxygen Delivery Method: Simple face mask Preoxygenation: Pre-oxygenation with 100% oxygen Intubation Type: IV induction Placement Confirmation: breath sounds checked- equal and bilateral and positive ETCO2 Dental Injury: Teeth and Oropharynx as per pre-operative assessment

## 2013-10-22 NOTE — Op Note (Signed)
NAME:  Nathan Hull, Nathan Hull NO.:  [id number]  MEDICAL RECORD NO.:  [id number]  LOCATION:  [facility]                         FACILITY:  [facility]  PHYSICIAN:  Lanelle Bal, MD    DATE OF BIRTH:  Aug 21, 1960  DATE OF PROCEDURE:  10/22/2013 DATE OF DISCHARGE:                              OPERATIVE REPORT   PREOPERATIVE DIAGNOSIS:  Cellulitis, right leg at previous vein harvest site.  POSTOPERATIVE DIAGNOSIS:  Cellulitis, right leg at previous vein harvest site.  PROCEDURE:  Incision and debridement of right leg wound and placement of VAC.  SURGEON:  Lanelle Bal, MD.  BRIEF HISTORY:  The patient is a 53 year old diabetic male, who presented following cardiopulmonary bypass surgery on October 06, 2013, by Dr. Prescott Gum with erythema in his right thigh endovein harvest site in the vicinity of the knee.  It was originally treated with p.o. antibiotics at home.  However, after 10 days of various antibiotics, he returned to the office really with no improvement still with area of greater than 10 cm long around the knee of significant erythema with extensive outpatient antibiotic therapy.  He was admitted.  Ultrasound of the area suggested an echogenic area along the tunnel.  The patient was admitted.  INR was markedly elevated.  This was corrected and incision and drainage of the area was recommended to him.  He agreed and signed informed consent.  DESCRIPTION OF PROCEDURE:  After appropriate time-out, under MAC anesthesia, the right leg was prepped with Betadine and draped in sterile manner.  10 mL of 1% lidocaine was infiltrated in the area over the endovein harvest.  A 1.5 cm incision was made.  This was carried down into the area of the tunnel.  There was old organized clot along the tunnel tract, which was evacuated and debrided.  The tunnel was opened for approximately 10 cm length.  Because of this, it was decided to place a wound VAC in the wound.  Although the  opening in the skin was approximately 2 cm, the entire area involved was 10 cm x 5 cm.  The [facility] device was applied.  Appropriate cultures of both the tissue were debrided and the fluid in the tunnel were performed.  The patient tolerated the procedure without obvious complication with minimal blood loss.  He was transferred to the recovery room for postoperative observation.     Lanelle Bal, MD     EG/MEDQ  D:  10/22/2013  T:  10/22/2013  Job:  [id number]

## 2013-10-22 NOTE — Brief Op Note (Signed)
      [address]       [place] [address]             [phone]      10/22/2013  8:50 AM  PATIENT:  Dario Guardian  53 y.o. male  PRE-OPERATIVE DIAGNOSIS:  RIGHT LEG CELLULITIS  POST-OPERATIVE DIAGNOSIS:  RIGHT LEG CELLULITIS  PROCEDURE:  Procedure(s): IRRIGATION AND DEBRIDEMENT EXTREMITY-RIGHT LEG (Right) APPLICATION OF WOUND VAC (Right)  SURGEON:  Surgeon(s) and Role:    * [name], MD - Primary   ANESTHESIA:   MAC  EBL:  Total I/O In: 500 [I.V.:500] Out: -   BLOOD ADMINISTERED:none  DRAINS: right leg vac   LOCAL MEDICATIONS USED:  LIDOCAINE  and Amount: 10 ml  SPECIMEN:  Source of Specimen:  right leg wound  DISPOSITION OF SPECIMEN:  micro  COUNTS:  YES  TOURNIQUET:  * No tourniquets in log *  DICTATION: .Dragon Dictation and Other Dictation: Dictation Number .  PLAN OF CARE: already admission  PATIENT DISPOSITION:  PACU - hemodynamically stable.   Delay start of Pharmacological VTE agent (>24hrs) due to surgical blood loss or risk of bleeding: yes

## 2013-10-22 NOTE — Progress Notes (Signed)
Wallet to security while pt. Is in o.r.

## 2013-10-22 NOTE — Transfer of Care (Signed)
Immediate Anesthesia Transfer of Care Note  Patient: Nathan Hull  Procedure(s) Performed: Procedure(s): IRRIGATION AND DEBRIDEMENT EXTREMITY-RIGHT LEG (Right) APPLICATION OF WOUND VAC (Right)  Patient Location: PACU  Anesthesia Type:MAC  Level of Consciousness: awake, alert  and oriented  Airway & Oxygen Therapy: Patient Spontanous Breathing  Post-op Assessment: Report given to PACU RN  Post vital signs: stable  Complications: No apparent anesthesia complications

## 2013-10-23 LAB — BASIC METABOLIC PANEL
Anion gap: 12 (ref 5–15)
BUN: 13 mg/dL (ref 6–23)
CO2: 28 mEq/L (ref 19–32)
Calcium: 8.7 mg/dL (ref 8.4–10.5)
Chloride: 101 mEq/L (ref 96–112)
Creatinine, Ser: 0.87 mg/dL (ref 0.50–1.35)
GFR calc Af Amer: >90 mL/min (ref >90)
GFR calc non Af Amer: >90 mL/min (ref >90)
Glucose, Bld: 83 mg/dL (ref 70–99)
Potassium: 4.3 mEq/L (ref 3.7–5.3)
Sodium: 141 mEq/L (ref 137–147)

## 2013-10-23 LAB — GLUCOSE, CAPILLARY
Glucose-Capillary: 94 mg/dL (ref 70–99)
Glucose-Capillary: 106 mg/dL — ABNORMAL HIGH (ref 70–99)
Glucose-Capillary: 116 mg/dL — ABNORMAL HIGH (ref 70–99)
Glucose-Capillary: 88 mg/dL (ref 70–99)

## 2013-10-23 LAB — CBC
HCT: 32.1 % — ABNORMAL LOW (ref 39.0–52.0)
Hemoglobin: 9.6 g/dL — ABNORMAL LOW (ref 13.0–17.0)
MCH: 26.2 pg (ref 26.0–34.0)
MCHC: 29.9 g/dL — ABNORMAL LOW (ref 30.0–36.0)
MCV: 87.7 fL (ref 78.0–100.0)
Platelets: 413 10*3/uL — ABNORMAL HIGH (ref 150–400)
RBC: 3.66 MIL/uL — ABNORMAL LOW (ref 4.22–5.81)
RDW: 14.3 % (ref 11.5–15.5)
WBC: 6.7 10*3/uL (ref 4.0–10.5)

## 2013-10-23 LAB — VANCOMYCIN, TROUGH: Vancomycin Tr: 9.4 ug/mL — ABNORMAL LOW (ref 10.0–20.0)

## 2013-10-23 MED ORDER — BISACODYL 5 MG PO TBEC
10.0000 mg | DELAYED_RELEASE_TABLET | Freq: Every day | ORAL | Status: DC | PRN
  Administered 2013-10-23: 10 mg via ORAL
  Filled 2013-10-23: qty 2

## 2013-10-23 MED ORDER — ZOLPIDEM TARTRATE 5 MG PO TABS
10.0000 mg | ORAL_TABLET | Freq: Every evening | ORAL | Status: DC | PRN
  Administered 2013-10-23 – 2013-10-25 (×2): 10 mg via ORAL
  Filled 2013-10-23 (×3): qty 2

## 2013-10-23 NOTE — Progress Notes (Addendum)
       [address]       [place] [address]             [phone]          1 Day Post-Op Procedure(s) (LRB): IRRIGATION AND DEBRIDEMENT EXTREMITY-RIGHT LEG (Right) APPLICATION OF WOUND VAC (Right)  Subjective: Leg feels much better.  Less pain.   Objective: Vital signs in last 24 hours: Patient Vitals for the past 24 hrs:  BP Temp Temp src Pulse Resp SpO2 Weight  10/23/13 0429 145/85 mmHg 98.4 F (36.9 C) Oral 75 19 97 % 277 lb (125.646 kg)  10/22/13 1956 148/79 mmHg 97.5 F (36.4 C) Oral 75 18 100 % -  10/22/13 1327 126/58 mmHg 98 F (36.7 C) Oral 74 18 97 % -  10/22/13 0935 155/83 mmHg 98.6 F (37 C) Oral 73 18 96 % -  10/22/13 0910 - 98 F (36.7 C) - - - - -  10/22/13 0908 - - - 73 17 95 % -  10/22/13 0906 - - - 73 15 95 % -  10/22/13 0900 144/72 mmHg - - 73 15 94 % -  10/22/13 0856 - - - 68 15 94 % -   Current Weight  10/23/13 277 lb (125.646 kg)   CBGs 272-53-66  Intake/Output from previous day: 07/24 0701 - 07/25 0700 In: 990 [P.O.:240; I.V.:500; IV Piggyback:250] Out: 4403 [Urine:1350; Drains:25]    PHYSICAL EXAM:  Heart: RRR Lungs: Clear Wound: R leg with VAC in place, little drainage in canister.  Still with surrounding erythema.  +edema     Lab Results: CBC: Recent Labs  10/22/13 0419 10/23/13 0352  WBC 6.1 6.7  HGB 9.5* 9.6*  HCT 31.7* 32.1*  PLT 437* 413*   BMET:  Recent Labs  10/22/13 0419 10/23/13 0352  NA 143 141  K 4.4 4.3  CL 100 101  CO2 29 28  GLUCOSE 88 83  BUN 10 13  CREATININE 0.89 0.87  CALCIUM 8.9 8.7    PT/INR:  Recent Labs  10/22/13 0419  LABPROT 21.5*  INR 1.87*   Cx's negative so far   Assessment/Plan: S/P Procedure(s) (LRB): IRRIGATION AND DEBRIDEMENT EXTREMITY-RIGHT LEG (Right) APPLICATION OF WOUND VAC (Right) Cellulitis- WBC stable, afebrile.  Continue abx therapy and follow up OR cultures. Continue VAC over the weekend. Plan VAC change on Monday.  DM- sugars stable  on po meds. CV- BPs elevated.  Will watch.  May need to titrate BB, ACE-I.  Maintaining SR. Per Dr. Servando Snare, will not resume Coumadin at this point and INR was supratherapeutic on admission.   LOS: 3 days    COLLINS,GINA H 10/23/2013  I have seen and examined the patient and agree with the assessment and plan as outlined.  OWEN,CLARENCE H 10/23/2013 12:13 PM

## 2013-10-23 NOTE — Progress Notes (Addendum)
ANTIBIOTIC CONSULT NOTE - FOLLOW UP  Pharmacy Consult for vancomycin Indication: RLE cellulitis  No Known Allergies  Patient Measurements: Height: 6' (182.9 cm) Weight: 277 lb (125.646 kg) IBW/kg (Calculated) : 77.6   Vital Signs: Temp: 98.4 F (36.9 C) (07/25 0429) Temp src: Oral (07/25 0429) BP: 145/85 mmHg (07/25 0429) Pulse Rate: 75 (07/25 0429) Intake/Output from previous day: 07/24 0701 - 07/25 0700 In: 990 [P.O.:240; I.V.:500; IV Piggyback:250] Out: 1610 [Urine:1350; Drains:25] Intake/Output from this shift:    Labs:  Recent Labs  10/20/13 1855 10/22/13 0419 10/23/13 0352  WBC 7.7 6.1 6.7  HGB 10.0* 9.5* 9.6*  PLT 439* 437* 413*  CREATININE 0.86 0.89 0.87   Estimated Creatinine Clearance: 137.5 ml/min (by C-G formula based on Cr of 0.87). No results found for this basename: VANCOTROUGH, Corlis Leak, VANCORANDOM, GENTTROUGH, GENTPEAK, GENTRANDOM, TOBRATROUGH, TOBRAPEAK, TOBRARND, AMIKACINPEAK, AMIKACINTROU, AMIKACIN,  in the last 72 hours   Microbiology: Recent Results (from the past 720 hour(s))  WOUND CULTURE     Status: None   Collection Time    10/22/13  7:48 AM      Result Value Ref Range Status   Specimen Description WOUND RIGHT THIGH   Final   Special Requests POF VANCOMYCIN   Final   Gram Stain PENDING   Incomplete   Culture     Final   Value: NO GROWTH 1 DAY     Performed at [facility]   Report Status PENDING   Incomplete  TISSUE CULTURE     Status: None   Collection Time    10/22/13  7:52 AM      Result Value Ref Range Status   Specimen Description TISSUE RIGHT THIGH   Final   Special Requests POF VANOMYCIN   Final   Gram Stain     Final   Value: FEW WBC PRESENT,BOTH PMN AND MONONUCLEAR     NO SQUAMOUS EPITHELIAL CELLS SEEN     NO ORGANISMS SEEN     Performed at [facility]   Culture     Final   Value: NO GROWTH 1 DAY     Performed at [facility]   Report Status PENDING   Incomplete    Anti-infectives   Start     Dose/Rate Route Frequency Ordered Stop   10/21/13 0900  vancomycin (VANCOCIN) 1,250 mg in sodium chloride 0.9 % 250 mL IVPB     1,250 mg 166.7 mL/hr over 90 Minutes Intravenous Every 12 hours 10/20/13 2024     10/20/13 1715  vancomycin (VANCOCIN) 2,000 mg in sodium chloride 0.9 % 500 mL IVPB     2,000 mg 250 mL/hr over 120 Minutes Intravenous  Once 10/20/13 1711 10/20/13 2241      Assessment: Patient is a 53 y.o M s/p I&D and VAC placment of right leg wound on 7/24 currently on vancomycin day #4.  Afeb, wbc wnl, stable renal function.  7/22 vanc Rx>>  7/24 right thigh wound>> ngtd 7/24 right thigh tissue>> ngtd   Goal of Therapy:  Vancomycin trough level 10-15 mcg/ml  Plan:  1) cont Vanc 1250mg  IV Q12H 2) will check vancomycin level with dose this afternoon to assess current regimen  Aarna Mihalko P 10/23/2013,11:39 AM  Adden: vancomycin trough level now back slightly below goal at 9.4, but dose was given one hour early this morning. True trough level is probably 10 and will likely be higher with anticipated accumulation over time.  Will keep vancomycin at 1250mg  IV q12h  for now.

## 2013-10-24 LAB — GLUCOSE, CAPILLARY
Glucose-Capillary: 91 mg/dL (ref 70–99)
Glucose-Capillary: 75 mg/dL (ref 70–99)
Glucose-Capillary: 145 mg/dL — ABNORMAL HIGH (ref 70–99)
Glucose-Capillary: 78 mg/dL (ref 70–99)

## 2013-10-24 LAB — WOUND CULTURE: Culture: NO GROWTH

## 2013-10-24 MED ORDER — HYDROCORTISONE 2.5 % RE CREA
TOPICAL_CREAM | Freq: Two times a day (BID) | RECTAL | Status: DC
  Administered 2013-10-24 – 2013-10-25 (×3): via RECTAL
  Administered 2013-10-25: 1 via RECTAL
  Filled 2013-10-24: qty 28.35

## 2013-10-24 NOTE — Progress Notes (Addendum)
       [address]       [place] [address]             [phone]          2 Days Post-Op Procedure(s) (LRB): IRRIGATION AND DEBRIDEMENT EXTREMITY-RIGHT LEG (Right) APPLICATION OF WOUND VAC (Right)  Subjective: Feels well.  Main complaint today is hemorrhoids.   Objective: Vital signs in last 24 hours: Patient Vitals for the past 24 hrs:  BP Temp Temp src Pulse Resp SpO2 Weight  10/24/13 0507 122/69 mmHg 98.4 F (36.9 C) Oral 70 20 97 % 275 lb 14.4 oz (125.147 kg)  10/23/13 2015 137/82 mmHg 97.9 F (36.6 C) Oral 75 17 94 % -  10/23/13 1439 112/68 mmHg 97.4 F (36.3 C) Oral 70 18 95 % -   Current Weight  10/24/13 275 lb 14.4 oz (125.147 kg)     Intake/Output from previous day: 07/25 0701 - 07/26 0700 In: 480 [P.O.:480] Out: 225 [Urine:225]    PHYSICAL EXAM:  Heart: RRR Lungs: Clear Wound: Right thigh with decreasing erythema and edema, Wound VAC in place     Lab Results: CBC: Recent Labs  10/22/13 0419 10/23/13 0352  WBC 6.1 6.7  HGB 9.5* 9.6*  HCT 31.7* 32.1*  PLT 437* 413*   BMET:  Recent Labs  10/22/13 0419 10/23/13 0352  NA 143 141  K 4.4 4.3  CL 100 101  CO2 29 28  GLUCOSE 88 83  BUN 10 13  CREATININE 0.89 0.87  CALCIUM 8.9 8.7    PT/INR:  Recent Labs  10/22/13 0419  LABPROT 21.5*  INR 1.87*      Assessment/Plan: S/P Procedure(s) (LRB): IRRIGATION AND DEBRIDEMENT EXTREMITY-RIGHT LEG (Right) APPLICATION OF WOUND VAC (Right) Cellulitis- WBC stable, afebrile. Continue abx, OR cultures with no growth so far. Plan VAC change in am.  DM- sugars stable on po meds.  CV- BPs better today.  Continue BB, ACE-I. Maintaining SR. Per Dr. Servando Snare, will not resume Coumadin at this point and INR was supratherapeutic on admission. Will order Anusol for hemorrhoids.   LOS: 4 days    COLLINS,GINA H 10/24/2013  I have seen and examined the patient and agree with the assessment and plan as outlined.  Wound cultures  no growth.  Cellulitis improved.  Possibly convert to wet-to-dry dressing changes and d/c home tomorrow.  Patient's mother has experience and is comfortable with wound care management.  Edison Nicholson H 10/24/2013 11:03 AM

## 2013-10-25 ENCOUNTER — Ambulatory Visit: Payer: BC Managed Care – PPO

## 2013-10-25 LAB — TISSUE CULTURE: Culture: NO GROWTH

## 2013-10-25 LAB — GLUCOSE, CAPILLARY
Glucose-Capillary: 93 mg/dL (ref 70–99)
Glucose-Capillary: 93 mg/dL (ref 70–99)
Glucose-Capillary: 99 mg/dL (ref 70–99)
Glucose-Capillary: 92 mg/dL (ref 70–99)

## 2013-10-25 MED ORDER — MORPHINE SULFATE 4 MG/ML IJ SOLN
INTRAMUSCULAR | Status: AC
  Filled 2013-10-25: qty 1

## 2013-10-25 MED ORDER — MORPHINE SULFATE 4 MG/ML IJ SOLN
4.0000 mg | INTRAMUSCULAR | Status: AC
  Administered 2013-10-25: 4 mg via INTRAVENOUS

## 2013-10-25 NOTE — Care Management Note (Signed)
    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  PTA, pt resides at home with mother.     Action/Plan:   Mom is a Sport and exercise psychologist, and is being taught to do dressing changes, per staff.  Will cont to follow for home needs.   Anticipated DC Date:  10/26/2013   Anticipated DC Plan:  [place]  CM consult      Choice offered to / List presented to:             Status of service:  Completed, signed off Medicare Important Message given?   (If response is "NO", the following Medicare IM given date fields will be blank) Date Medicare IM given:   Medicare IM given by:   Date Additional Medicare IM given:   Additional Medicare IM given by:    Discharge Disposition:  HOME/SELF CARE  Per UR Regulation:  Reviewed for med. necessity/level of care/duration of stay  If discussed at Blanket of Stay Meetings, dates discussed:   10/26/2013    Comments:  10/26/13 Ellan Lambert, RN, BSN [phone] Pt's mother has received education on wet to dry dressing changes and MD okay with pt going home without home health RN.

## 2013-10-25 NOTE — Discharge Summary (Signed)
Physician Discharge Summary  Patient ID: Nathan Hull MRN: [id number] DOB/AGE: 51-18-1962 51 y.o.  Admit date: 10/20/2013 Discharge date: 10/25/2013  Admission Diagnoses:EVH site wound infection with cellulitis  History of present illness:  Patient is a 53 year old male status post CABG x3 on 09/24/2013 by Dr.Van Trigt. He developed a right-sided EVH site wound infection and was followed in the office. He had originally been placed on Keflex and doxycycline. On 10/20/2013 he was seen in the office and it was felt that he would require admission for intravenous antibiotics as well as debridement as the wound infection  had progressed.   Past Medical History:  Past Medical History   Diagnosis  Date   .  CAD (coronary artery disease)    .  Dyspnea    .  Hypertension    .  Hyperlipidemia    .  Obesity    .  Myocardial infarct    .  Family history of anesthesia complication      mother had N/V after valve replacement   Post op atrial fibrillation  Past Surgical History:  Past Surgical History   Procedure  Laterality  Date   .  Ptca at [facility]       04/28/2005   .  C-spine disc replacement x 2       06/16/2012   .  Carpal tunnel release       on right   .  Tonsillectomy     .  Cardiac catheterization       last week @ [place]   .  Cardiac stents       has been cathed x 4 Had MI in 2007   .  Coronary artery bypass graft  N/A  09/24/2013     Procedure: CORONARY ARTERY BYPASS GRAFTING (CABG) x three, using LIMA to LAD, and right leg greater saphenous vein harvested endoscopically - SVG to Ramus, SVG to PL; Surgeon: [name], MD; Location: [place]; Service: Open Heart Surgery; Laterality: N/A;   .  Intraoperative transesophageal echocardiogram  N/A  09/24/2013     Procedure: INTRAOPERATIVE TRANSESOPHAGEAL ECHOCARDIOGRAM; Surgeon: [name], MD; Location: [place]; Service: Open Heart Surgery; Laterality: N/A;    Family History:  Family History   Problem  Relation  Age of Onset   .   Hypertension  Mother    .  Hypertension  Father    .  Rheumatic fever  Mother      S/P MVR    Social History:  reports that he quit smoking about 8 years ago. His smoking use included Cigarettes. He smoked 2.00 packs per day. He has never used smokeless tobacco. He reports that he drinks about 3.6 ounces of alcohol per week. He reports that he does not use illicit drugs.  Allergies:  No Known Allergies     Discharge Diagnoses:  Active Problems:   Cellulitis   Discharged Condition: good  Hospital Course:  the patient was admitted and placed on intravenous antibiotics (vancomycin). He was taken to the operating room on 10/22/2013 by Dr. Servando Snare and underwent the following procedure: DATE OF PROCEDURE: 10/22/2013  DATE OF DISCHARGE:  OPERATIVE REPORT  PREOPERATIVE DIAGNOSIS: Cellulitis, right leg at previous vein harvest  site.  POSTOPERATIVE DIAGNOSIS: Cellulitis, right leg at previous vein harvest  site.  PROCEDURE: Incision and debridement of right leg wound and placement of  VAC.  SURGEON: Lanelle Bal, MD.  He tolerated the procedure well and was taken to the post  anesthesia care unit in stable condition.  Postoperative hospital course:  The patient has done well. He continued to be maintained on intravenous vancomycin and showed significant improvement in the cellulitis. The VAC has been changed and he shows improved healing of the wound. He is otherwise stable and tentatively he is felt he will be discharged in the morning with a wet-to-dry dressing changes and oral antibiotics, specifically resuming his Keflex/doxycycline for an additional 10 days.  Consults: None  Significant Diagnostic Studies: Lower extremity venous duplex: Negative for DVT    Discharge Exam: Blood pressure 118/66, pulse 68, temperature 98.2 F (36.8 C), temperature source Oral, resp. rate 18, height 6' (1.829 m), weight 274 lb 9.6 oz (124.558 kg), SpO2 97.00%.  General appearance: alert,  cooperative and no distress  Wound: dressing changed, good granulation tissue in wound, minor serous drainage     Disposition: 01-Home or Self Care  Medications at time of discharge:   Medication List    STOP taking these medications       digoxin 0.125 MG tablet  Commonly known as:  LANOXIN     docusate sodium 100 MG capsule  Commonly known as:  COLACE     furosemide 40 MG tablet  Commonly known as:  LASIX     metolazone 5 MG tablet  Commonly known as:  ZAROXOLYN     potassium chloride SA 20 MEQ tablet  Commonly known as:  K-DUR,KLOR-CON     TYLENOL 500 MG tablet  Generic drug:  acetaminophen     warfarin 5 MG tablet  Commonly known as:  COUMADIN      TAKE these medications       aspirin 325 MG EC tablet  Take 1 tablet (325 mg total) by mouth daily.     atorvastatin 80 MG tablet  Commonly known as:  LIPITOR  Take 80 mg by mouth daily.     bisacodyl 5 MG EC tablet  Commonly known as:  DULCOLAX  Take 10 mg by mouth daily as needed for moderate constipation.     cephALEXin 500 MG capsule  Commonly known as:  KEFLEX  Take 1 capsule (500 mg total) by mouth 3 (three) times daily. 10 day course     doxycycline 100 MG EC tablet  Commonly known as:  DORYX  Take 1 tablet (100 mg total) by mouth 2 (two) times daily.     ferrous [id number] C-folic acid capsule  Commonly known as:  TRINSICON / FOLTRIN  Take 1 capsule by mouth daily.     freestyle lancets  Use as instructed     glucose blood test strip  Use as instructed     glucose monitoring kit monitoring kit  1 each by Does not apply route as needed for other.     GOLD BOND MEDICATED ANTI ITCH 1-1 % Crea  Generic drug:  Pramoxine-Menthol  Apply 1 application topically daily.     lisinopril 5 MG tablet  Commonly known as:  PRINIVIL,ZESTRIL  Take 1 tablet (5 mg total) by mouth daily.     metFORMIN 500 MG tablet  Commonly known as:  GLUCOPHAGE  Take 1 tablet (500 mg total) by mouth 2 (two)  times daily with a meal.     metoprolol 50 MG tablet  Commonly known as:  LOPRESSOR  Take 50 mg by mouth 2 (two) times daily.     oxyCODONE 5 MG immediate release tablet  Commonly known as:  Oxy IR/ROXICODONE  Take 5 mg by  mouth 2 (two) times daily as needed for moderate pain.            Follow-up Information   Follow up with VAN Wilber Oliphant, MD. (11/01/2013 at 1:30 PM to see the PA.)    Specialty:  Cardiothoracic Surgery   Contact information:   [place] [address] [phone]       Signed: John Giovanni 10/25/2013, 9:21 AM

## 2013-10-25 NOTE — Discharge Instructions (Signed)

## 2013-10-25 NOTE — Progress Notes (Addendum)
      [address]       [place] [address]             [phone]      3 Days Post-Op Procedure(s) (LRB): IRRIGATION AND DEBRIDEMENT EXTREMITY-RIGHT LEG (Right) APPLICATION OF WOUND VAC (Right) Subjective: Feels ok, minimal discomfort  Objective: Vital signs in last 24 hours: Temp:  [97.5 F (36.4 C)-98.2 F (36.8 C)] 98.2 F (36.8 C) (07/27 0546) Pulse Rate:  [68-73] 68 (07/27 0546) Cardiac Rhythm:  [-] Normal sinus rhythm (07/26 2156) Resp:  [18] 18 (07/27 0546) BP: (118-142)/(66-83) 118/66 mmHg (07/27 0546) SpO2:  [97 %-100 %] 97 % (07/27 0546) Weight:  [274 lb 9.6 oz (124.558 kg)] 274 lb 9.6 oz (124.558 kg) (07/27 0546)  Hemodynamic parameters for last 24 hours:    Intake/Output from previous day: 07/26 0701 - 07/27 0700 In: 850 [P.O.:600; IV Piggyback:250] Out: -  Intake/Output this shift:    General appearance: alert, cooperative and no distress Heart: regular rate and rhythm Lungs: clear to auscultation bilaterally Abdomen: benign Extremities: minor edema Wound: mild erethema of wound, vac in place  Lab Results:  Recent Labs  10/23/13 0352  WBC 6.7  HGB 9.6*  HCT 32.1*  PLT 413*   BMET:  Recent Labs  10/23/13 0352  NA 141  K 4.3  CL 101  CO2 28  GLUCOSE 83  BUN 13  CREATININE 0.87  CALCIUM 8.7    PT/INR: No results found for this basename: LABPROT, INR,  in the last 72 hours ABG    Component Value Date/Time   PHART 7.373 09/25/2013 0440   HCO3 25.5* 09/25/2013 0440   TCO2 26 09/25/2013 1723   ACIDBASEDEF 1.0 09/24/2013 1723   O2SAT 94.0 09/25/2013 0440   CBG (last 3)   Recent Labs  10/24/13 1618 10/24/13 2126 10/25/13 0629  GLUCAP 145* 78 93   Scheduled Meds: . aspirin EC  325 mg Oral Daily  . atorvastatin  80 mg Oral q1800  . docusate sodium  100 mg Oral Daily  . enoxaparin (LOVENOX) injection  40 mg Subcutaneous Q24H  . ferrous [id number] C-folic acid  1 capsule Oral Daily  . hydrocortisone    Rectal BID  . insulin aspart  0-15 Units Subcutaneous TID WC  . lisinopril  5 mg Oral Daily  . metFORMIN  500 mg Oral BID WC  . metoprolol tartrate  50 mg Oral BID  . sodium chloride  3 mL Intravenous Q12H  . vancomycin  1,250 mg Intravenous Q12H   Continuous Infusions:  PRN Meds:.sodium chloride, acetaminophen, acetaminophen, bisacodyl, ondansetron (ZOFRAN) IV, ondansetron, oxyCODONE, sodium chloride, zolpidem No results found.  Assessment/Plan: S/P Procedure(s) (LRB): IRRIGATION AND DEBRIDEMENT EXTREMITY-RIGHT LEG (Right) APPLICATION OF WOUND VAC (Right)  1 sugars adeq controlled 2 wound conts to improvei 3 on IV vanco 4 poss home soon  LOS: 5 days    GOLD,WAYNE E 10/25/2013  wound changed and repacked personally- 2x2 wet to dry and VAC DC'ed Needs another 24-48 hrs of iv vanco for cellulitis Need to instruct family on daily dressing changes  patient examined and medical record reviewed,agree with above note. [name],Tatyanna Cronk 10/25/2013

## 2013-10-26 ENCOUNTER — Other Ambulatory Visit: Payer: Self-pay | Admitting: *Deleted

## 2013-10-26 ENCOUNTER — Encounter (HOSPITAL_COMMUNITY): Payer: Self-pay | Admitting: Cardiothoracic Surgery

## 2013-10-26 DIAGNOSIS — R52 Pain, unspecified: Secondary | ICD-10-CM

## 2013-10-26 LAB — GLUCOSE, CAPILLARY: Glucose-Capillary: 85 mg/dL (ref 70–99)

## 2013-10-26 MED ORDER — ASPIRIN 325 MG PO TBEC: 325.0000 mg | DELAYED_RELEASE_TABLET | Freq: Every day | ORAL | Status: DC

## 2013-10-26 MED ORDER — DOXYCYCLINE HYCLATE 100 MG PO TBEC: 100.0000 mg | DELAYED_RELEASE_TABLET | Freq: Two times a day (BID) | ORAL | Status: DC

## 2013-10-26 MED ORDER — OXYCODONE HCL 5 MG PO TABS: 5.0000 mg | ORAL_TABLET | ORAL | Status: DC | PRN

## 2013-10-26 MED ORDER — CEPHALEXIN 500 MG PO CAPS: 500.0000 mg | ORAL_CAPSULE | Freq: Three times a day (TID) | ORAL | Status: AC

## 2013-10-26 MED ORDER — FE FUMARATE-B12-VIT C-FA-IFC PO CAPS: 1.0000 | ORAL_CAPSULE | Freq: Every day | ORAL | Status: DC

## 2013-10-26 NOTE — Discharge Summary (Signed)
Medical record reviewed, agree with above findings and plan

## 2013-10-26 NOTE — Progress Notes (Signed)
Patient discharged to home.  Patient alert, oriented, verbally responsive, breathing regular and non-labored throughout, no s/s of distress noted throughout, no c/o pain throughout.  Discharge instructions verbalized to patient and mother at bedside.  Both verbalized understanding throughout.  Patient left unit per wheelchair accompanied by volunteer staff.  VS WNL.  Dirk Dress 10/26/2013 9:14 AM

## 2013-10-26 NOTE — Progress Notes (Signed)
Mr. Grow was just released from the hospital.  He came to the office because he did not receive a script for Oxycodone. This was the pain med ordered per his discharge instructions.  I printed a script and it was signed by Dr. Roxan Hockey.

## 2013-10-26 NOTE — Progress Notes (Signed)
      [address]       [place] [address]             [phone]   4 Days Post-Op Procedure(s) (LRB): IRRIGATION AND DEBRIDEMENT EXTREMITY-RIGHT LEG (Right) APPLICATION OF WOUND VAC (Right) Subjective: Feels fine, minimal discomfort Objective: Vital signs in last 24 hours: Temp:  [97.2 F (36.2 C)-98.2 F (36.8 C)] 97.4 F (36.3 C) (07/28 0456) Pulse Rate:  [66-73] 72 (07/28 0456) Cardiac Rhythm:  [-] Normal sinus rhythm (07/27 0917) Resp:  [18] 18 (07/28 0456) BP: (112-135)/(66-88) 112/68 mmHg (07/28 0456) SpO2:  [95 %-99 %] 95 % (07/28 0456) Weight:  [275 lb 6.4 oz (124.921 kg)] 275 lb 6.4 oz (124.921 kg) (07/28 0456)  Hemodynamic parameters for last 24 hours:    Intake/Output from previous day: 07/27 0701 - 07/28 0700 In: 603 [P.O.:600; I.V.:3] Out: -  Intake/Output this shift:    General appearance: alert, cooperative and no distress Wound: dressing changed, good granulation tissue in wound, minor serous drainage  Lab Results: No results found for this basename: WBC, HGB, HCT, PLT,  in the last 72 hours BMET: No results found for this basename: NA, K, CL, CO2, GLUCOSE, BUN, CREATININE, CALCIUM,  in the last 72 hours  PT/INR: No results found for this basename: LABPROT, INR,  in the last 72 hours ABG    Component Value Date/Time   PHART 7.373 09/25/2013 0440   HCO3 25.5* 09/25/2013 0440   TCO2 26 09/25/2013 1723   ACIDBASEDEF 1.0 09/24/2013 1723   O2SAT 94.0 09/25/2013 0440   CBG (last 3)   Recent Labs  10/25/13 1659 10/25/13 2056 10/26/13 0605  GLUCAP 99 92 85   , Scheduled Meds: . aspirin EC  325 mg Oral Daily  . atorvastatin  80 mg Oral q1800  . docusate sodium  100 mg Oral Daily  . enoxaparin (LOVENOX) injection  40 mg Subcutaneous Q24H  . ferrous [id number] C-folic acid  1 capsule Oral Daily  . hydrocortisone   Rectal BID  . insulin aspart  0-15 Units Subcutaneous TID WC  . lisinopril  5 mg Oral Daily  . metFORMIN   500 mg Oral BID WC  . metoprolol tartrate  50 mg Oral BID  . sodium chloride  3 mL Intravenous Q12H  . vancomycin  1,250 mg Intravenous Q12H   Continuous Infusions:  PRN Meds:.sodium chloride, acetaminophen, acetaminophen, bisacodyl, ondansetron (ZOFRAN) IV, ondansetron, oxyCODONE, sodium chloride, zolpidem     Assessment/Plan: S/P Procedure(s) (LRB): IRRIGATION AND DEBRIDEMENT EXTREMITY-RIGHT LEG (Right) APPLICATION OF WOUND VAC (Right) Plan for discharge: see discharge orders   LOS: 6 days    Nathan Hull E 10/26/2013

## 2013-10-27 ENCOUNTER — Other Ambulatory Visit: Payer: Self-pay | Admitting: Cardiothoracic Surgery

## 2013-10-27 DIAGNOSIS — I4891 Unspecified atrial fibrillation: Secondary | ICD-10-CM

## 2013-10-27 LAB — ANAEROBIC CULTURE: Gram Stain: NONE SEEN

## 2013-10-27 NOTE — Progress Notes (Signed)
Education offered to patients mother for patients wet to dry dressing changes.  Patient mother states that she is a CNA 1 and CNA 2 and she is understands how to perform the wet to dry dressing changes. Wet to dry dressing change information sheet administered to patient and mother for reinforcement purposes.  All questions answered thoroughly.   Dirk Dress

## 2013-11-01 ENCOUNTER — Ambulatory Visit (INDEPENDENT_AMBULATORY_CARE_PROVIDER_SITE_OTHER): Payer: Self-pay | Admitting: Physician Assistant

## 2013-11-01 ENCOUNTER — Encounter: Payer: Self-pay | Admitting: Physician Assistant

## 2013-11-01 VITALS — BP 110/67 | HR 76 | Ht 72.0 in | Wt 275.0 lb

## 2013-11-01 DIAGNOSIS — R52 Pain, unspecified: Secondary | ICD-10-CM

## 2013-11-01 MED ORDER — OXYCODONE HCL 5 MG PO TABS: 5.0000 mg | ORAL_TABLET | Freq: Four times a day (QID) | ORAL | Status: DC | PRN

## 2013-11-01 NOTE — Patient Instructions (Signed)
Wet to dry daily dressing changes RLE wound

## 2013-11-01 NOTE — Progress Notes (Signed)
HPI:  Patient returns for routine postoperative follow-up having undergone an I and D of the right lower leg wound (at [facility] site) for cellulitis and placement of a wound vac by Dr. Servando Snare on 10/22/2013. He initially had a CABG x 6 by Dr. Prescott Gum on 09/24/2013. Since hospital discharge the patient reports, less drainage from RLE wound. He denies any fever or chills. He states he is sleeping better. He also reports he checks his glucose three times daily and it has not been over 120. He stopped taking Metformin because it was making him "feel lousy" because of hypoglycemia.   Current Outpatient Prescriptions  Medication Sig Dispense Refill  . aspirin EC 325 MG EC tablet Take 1 tablet (325 mg total) by mouth daily.      Marland Kitchen atorvastatin (LIPITOR) 80 MG tablet Take 80 mg by mouth daily.       . bisacodyl (DULCOLAX) 5 MG EC tablet Take 10 mg by mouth daily as needed for moderate constipation.      . cephALEXin (KEFLEX) 500 MG capsule Take 1 capsule (500 mg total) by mouth 3 (three) times daily. 10 day course  30 capsule  0  . doxycycline (DORYX) 100 MG EC tablet Take 1 tablet (100 mg total) by mouth 2 (two) times daily.  20 tablet  0  . ferrous [id number] C-folic acid (TRINSICON / FOLTRIN) capsule Take 1 capsule by mouth daily.  90 capsule  0  . glucose blood test strip Use as instructed  100 each  12  . glucose monitoring kit (FREESTYLE) monitoring kit 1 each by Does not apply route as needed for other.  1 each  0  . Lancets (FREESTYLE) lancets Use as instructed  100 each  12  . lisinopril (PRINIVIL,ZESTRIL) 5 MG tablet Take 1 tablet (5 mg total) by mouth daily.  30 tablet  3  . metFORMIN (GLUCOPHAGE) 500 MG tablet Take 1 tablet (500 mg total) by mouth 2 (two) times daily with a meal.  60 tablet  3  . metoprolol (LOPRESSOR) 50 MG tablet Take 50 mg by mouth 2 (two) times daily.       Marland Kitchen oxyCODONE (OXY IR/ROXICODONE) 5 MG immediate release tablet Take 1 tablet (5 mg total) by mouth every 4  (four) hours as needed for severe pain. Take 1 tablet every four hours prn pain  40 tablet  0  . Pramoxine-Menthol (GOLD BOND MEDICATED ANTI ITCH) 1-1 % CREA Apply 1 application topically daily.       No current facility-administered medications for this visit.    Physical Exam: Cardiovascular: RRR Pulmonary: Clear to auscultation bilaterally Extremities: 2+ edema RLE;no LLE edema Wounds: Sternal wound is clean, dry, well healed. One eschar removed from left chest tube site. RLE wound has slight sero sanguinous drainage and no purulence. The area around his RLE wound is firm and has some darkish pink color (some induration), but not erythema as seen previously.    Impression and Plan: 1. Cellulitis RLE. He was instructed to continue both Keflex and Doxycycline until finished. He is to continue daily dressing changes (damp to dry).He was instructed if he develops fever, chills, increasing redness etc. he is to call the office to be seen as soon as possible;otherwise, he will be seen by Dr. Prescott Gum next Wednesday August 12th. Patient requested a refill, which was given, on Oxycodone IR 5 mg as still has pain with daily dressing changes. 2.S/p CABG x 6. The patient had a fib with RVR  post op. Amiodarone, Digoxin, and Coumadin haveall  been stopped. Per the patient, the cardiologist (Dr. Humphrey Rolls) instructed him to restart Plavix 75 daily and decrease ecasa to 81 mg daily. The cardiologist also gave him another week's worth of Lasix. He apparently has another appointment to see cardiologist in a few months. 3.DM-HGA1C 6.6. Prior to his CABG, he was pre diabetic. I discussed the importance of follow up with his medical doctor (primary care physician) as soon as possible. We discussed taking Metformin 500 mg daily instead of two times daily, but he wants to discuss with medical doctor. He stated he will continue to closely monitor glucose in the meantime.

## 2013-11-10 ENCOUNTER — Ambulatory Visit (INDEPENDENT_AMBULATORY_CARE_PROVIDER_SITE_OTHER): Payer: Self-pay | Admitting: Cardiothoracic Surgery

## 2013-11-10 ENCOUNTER — Encounter: Payer: Self-pay | Admitting: Cardiothoracic Surgery

## 2013-11-10 VITALS — BP 117/75 | HR 60 | Ht 72.0 in | Wt 275.0 lb

## 2013-11-10 DIAGNOSIS — L02818 Cutaneous abscess of other sites: Secondary | ICD-10-CM

## 2013-11-10 DIAGNOSIS — L03818 Cellulitis of other sites: Secondary | ICD-10-CM

## 2013-11-10 NOTE — Progress Notes (Signed)
PCP is Carmon Ginsberg, MD Referring Provider is Dionisio David, MD  Chief Complaint  Patient presents with  . F/U CARDIAC    1 WK F/U    HPI: The patient returns for wound check of right leg and a vein harvest site. A wound infection was treated with incision and drainage and a wound VAC and IV antibiotics. The patient is an outpatient being treated with twice a day wet-to-dry dressing changes and oral antibiotics. Wound cultures from the OR were unremarkable. Clinically the area is less tender and is healing. There is still erythema and tissue cellulitis. The sternal incision is well-healed.   Past Medical History  Diagnosis Date  . CAD (coronary artery disease)   . Dyspnea   . Hypertension   . Hyperlipidemia   . Obesity   . Myocardial infarct   . Family history of anesthesia complication     mother had N/V after valve replacement    Past Surgical History  Procedure Laterality Date  . Ptca at [facility]      04/28/2005  . C-spine disc replacement x 2      06/16/2012  . Carpal tunnel release      on right  . Tonsillectomy    . Cardiac catheterization      last week @ [place]  . Cardiac stents      has been cathed x 4   Had  MI  in 2007  . Coronary artery bypass graft N/A 09/24/2013    Procedure: CORONARY ARTERY BYPASS GRAFTING (CABG) x three, using LIMA to LAD, and right leg greater saphenous vein harvested endoscopically - SVG to Ramus, SVG to PL;  Surgeon: [name], MD;  Location: [place];  Service: Open Heart Surgery;  Laterality: N/A;  . Intraoperative transesophageal echocardiogram N/A 09/24/2013    Procedure: INTRAOPERATIVE TRANSESOPHAGEAL ECHOCARDIOGRAM;  Surgeon: [name], MD;  Location: [place];  Service: Open Heart Surgery;  Laterality: N/A;  . I&d extremity Right 10/22/2013    Procedure: IRRIGATION AND DEBRIDEMENT EXTREMITY-RIGHT LEG;  Surgeon: [name], MD;  Location: [facility];  Service: Vascular;  Laterality: Right;  . Application of wound vac Right  10/22/2013    Procedure: APPLICATION OF WOUND VAC;  Surgeon: [name], MD;  Location: [place];  Service: Vascular;  Laterality: Right;    Family History  Problem Relation Age of Onset  . Hypertension Mother   . Hypertension Father   . Rheumatic fever Mother     S/P MVR    Social History History  Substance Use Topics  . Smoking status: Former Smoker -- 2.00 packs/day    Types: Cigarettes    Quit date: 04/23/2005  . Smokeless tobacco: Never Used  . Alcohol Use: 3.6 oz/week    6 Cans of beer per week     Comment: 6 pk  a week    Current Outpatient Prescriptions  Medication Sig Dispense Refill  . aspirin EC 325 MG EC tablet Take 1 tablet (325 mg total) by mouth daily.      . clopidogrel (PLAVIX) 75 MG tablet Take 75 mg by mouth daily.      Marland Kitchen lisinopril (PRINIVIL,ZESTRIL) 5 MG tablet Take 1 tablet (5 mg total) by mouth daily.  30 tablet  3  . metoprolol (LOPRESSOR) 50 MG tablet Take 50 mg by mouth 2 (two) times daily.        No current facility-administered medications for this visit.    No Known Allergies  Review  of Systems patient denies fever. Patient states his blood sugars are well-controlled  BP 117/75  Pulse 60  Ht 6' (1.829 m)  Wt 275 lb (124.739 kg)  BMI 37.29 kg/m2  SpO2 95% Physical Exam Lungs are clear Heart rhythm is regular Right leg incision is visually inspected and is 100% granulation tissue. A 2 x 2 wet-to-dry dressing is reapplied. There is surrounding erythema  Diagnostic Tests: None  Impression: Improved leg and the vein harvest site wound infection  Plan: Continue wet-to-dry dressing changes Continue oral doxycycline 100 mg twice a day-new prescription provided Return to office for wound check in 2 weeks

## 2013-11-13 ENCOUNTER — Other Ambulatory Visit: Payer: Self-pay | Admitting: Cardiothoracic Surgery

## 2013-11-24 ENCOUNTER — Encounter: Payer: Self-pay | Admitting: Cardiothoracic Surgery

## 2013-11-24 ENCOUNTER — Ambulatory Visit (INDEPENDENT_AMBULATORY_CARE_PROVIDER_SITE_OTHER): Payer: Self-pay | Admitting: Cardiothoracic Surgery

## 2013-11-24 VITALS — BP 109/67 | HR 60 | Ht 72.0 in | Wt 275.0 lb

## 2013-11-24 DIAGNOSIS — L02818 Cutaneous abscess of other sites: Secondary | ICD-10-CM

## 2013-11-24 DIAGNOSIS — L03818 Cellulitis of other sites: Secondary | ICD-10-CM

## 2013-11-24 NOTE — Progress Notes (Signed)
PCP is Carmon Ginsberg, MD Referring Provider is Dionisio David, MD  Chief Complaint  Patient presents with  . F/U CARDIAC    2 WK F/U    HPI: Final office visit 2 months after multivessel CABG Patient developed postop atrial for relation which has resolved The patient developed cellulitis and localized infection at the right leg saphenous vein harvest site which has required antibiotics as well as incision and drainage an outpatient dressing changes. This area has not healed over and requires only a Band-Aid dressing. The tenderness and cellulitis has resolved. The patient still has some induration and mild erythema in the area. He recently finished a course of doxycycline 100 mg twice a day. Otherwise his chest incision is healing well and he is walking up to a mile daily.   Past Medical History  Diagnosis Date  . CAD (coronary artery disease)   . Dyspnea   . Hypertension   . Hyperlipidemia   . Obesity   . Myocardial infarct   . Family history of anesthesia complication     mother had N/V after valve replacement    Past Surgical History  Procedure Laterality Date  . Ptca at [facility]      04/28/2005  . C-spine disc replacement x 2      06/16/2012  . Carpal tunnel release      on right  . Tonsillectomy    . Cardiac catheterization      last week @ [place]  . Cardiac stents      has been cathed x 4   Had  MI  in 2007  . Coronary artery bypass graft N/A 09/24/2013    Procedure: CORONARY ARTERY BYPASS GRAFTING (CABG) x three, using LIMA to LAD, and right leg greater saphenous vein harvested endoscopically - SVG to Ramus, SVG to PL;  Surgeon: [name], MD;  Location: [place];  Service: Open Heart Surgery;  Laterality: N/A;  . Intraoperative transesophageal echocardiogram N/A 09/24/2013    Procedure: INTRAOPERATIVE TRANSESOPHAGEAL ECHOCARDIOGRAM;  Surgeon: [name], MD;  Location: [place];  Service: Open Heart Surgery;  Laterality: N/A;  . I&d extremity Right 10/22/2013   Procedure: IRRIGATION AND DEBRIDEMENT EXTREMITY-RIGHT LEG;  Surgeon: [name], MD;  Location: [facility] OR;  Service: Vascular;  Laterality: Right;  . Application of wound vac Right 10/22/2013    Procedure: APPLICATION OF WOUND VAC;  Surgeon: [name], MD;  Location: [place];  Service: Vascular;  Laterality: Right;    Family History  Problem Relation Age of Onset  . Hypertension Mother   . Hypertension Father   . Rheumatic fever Mother     S/P MVR    Social History History  Substance Use Topics  . Smoking status: Former Smoker -- 2.00 packs/day    Types: Cigarettes    Quit date: 04/23/2005  . Smokeless tobacco: Never Used  . Alcohol Use: 3.6 oz/week    6 Cans of beer per week     Comment: 6 pk  a week    Current Outpatient Prescriptions  Medication Sig Dispense Refill  . aspirin EC 325 MG EC tablet Take 1 tablet (325 mg total) by mouth daily.      Marland Kitchen atorvastatin (LIPITOR) 80 MG tablet       . clopidogrel (PLAVIX) 75 MG tablet Take 75 mg by mouth daily.      Marland Kitchen lisinopril (PRINIVIL,ZESTRIL) 5 MG tablet Take 1 tablet (5 mg total) by mouth daily.  30 tablet  3  . metoprolol (LOPRESSOR) 50 MG tablet Take 50 mg by mouth 2 (two) times daily.        No current facility-administered medications for this visit.    No Known Allergies  Review of Systems no fever or drainage BP 109/67  Pulse 60  Ht 6' (1.829 m)  Wt 275 lb (124.739 kg)  BMI 37.29 kg/m2  SpO2 97% Physical Exam  Alert and comfortable Heart rhythm regular without murmur Lungs clear Right leg and the vein harvest site incision healed over with surrounding induration but nontender Diagnostic Tests: None  Impression: Resolving  leg infection The patient will take doxycycline 1 tablet daily-100 mg over 3 weeks to resolve any residual infection.  Plan: Return as needed The patient is ready to start outpatient cardiac rehabilitation at [facility] when approved by his insurance

## 2013-12-02 LAB — BASIC METABOLIC PANEL
BUN: 12 mg/dL (ref 4–21)
Creatinine: 0.9 mg/dL (ref 0.6–1.3)
Glucose: 100 mg/dL
Potassium: 4.5 mmol/L (ref 3.4–5.3)
SODIUM: 140 mmol/L (ref 137–147)

## 2013-12-02 LAB — LIPID PANEL
CHOLESTEROL: 130 mg/dL (ref 0–200)
HDL: 35 mg/dL (ref 35–70)
LDL Cholesterol: 57 mg/dL
TRIGLYCERIDES: 192 mg/dL — AB (ref 40–160)

## 2013-12-08 ENCOUNTER — Encounter: Payer: Self-pay | Admitting: Cardiovascular Disease

## 2013-12-30 ENCOUNTER — Encounter: Payer: Self-pay | Admitting: Cardiovascular Disease

## 2014-01-30 ENCOUNTER — Encounter: Payer: Self-pay | Admitting: Cardiovascular Disease

## 2014-03-01 ENCOUNTER — Encounter: Payer: Self-pay | Admitting: Cardiovascular Disease

## 2014-04-01 ENCOUNTER — Encounter: Payer: Self-pay | Admitting: Cardiovascular Disease

## 2014-08-19 LAB — HEMOGLOBIN A1C: HEMOGLOBIN A1C: 5.9 % (ref 4.0–6.0)

## 2014-11-14 ENCOUNTER — Encounter: Payer: Self-pay | Admitting: Family Medicine

## 2014-11-14 ENCOUNTER — Ambulatory Visit (INDEPENDENT_AMBULATORY_CARE_PROVIDER_SITE_OTHER): Payer: BLUE CROSS/BLUE SHIELD | Admitting: Family Medicine

## 2014-11-14 VITALS — BP 110/78 | HR 56 | Temp 98.0°F | Resp 16 | Ht 72.0 in | Wt 310.0 lb

## 2014-11-14 DIAGNOSIS — H109 Unspecified conjunctivitis: Secondary | ICD-10-CM

## 2014-11-14 DIAGNOSIS — E668 Other obesity: Secondary | ICD-10-CM | POA: Insufficient documentation

## 2014-11-14 DIAGNOSIS — J0100 Acute maxillary sinusitis, unspecified: Secondary | ICD-10-CM | POA: Diagnosis not present

## 2014-11-14 DIAGNOSIS — E119 Type 2 diabetes mellitus without complications: Secondary | ICD-10-CM | POA: Insufficient documentation

## 2014-11-14 DIAGNOSIS — E669 Obesity, unspecified: Secondary | ICD-10-CM | POA: Insufficient documentation

## 2014-11-14 MED ORDER — AMOXICILLIN-POT CLAVULANATE 875-125 MG PO TABS
1.0000 | ORAL_TABLET | Freq: Two times a day (BID) | ORAL | Status: DC
Start: 2014-11-14 — End: 2015-01-24

## 2014-11-14 MED ORDER — SULFACETAMIDE SODIUM 10 % OP SOLN: 2.0000 [drp] | Freq: Four times a day (QID) | OPHTHALMIC | Status: DC

## 2014-11-14 NOTE — Progress Notes (Signed)
Subjective:     Patient ID: Nathan Hull, male   DOB: February 06, 1961, 54 y.o.   MRN: [id number]  HPI  Chief Complaint  Patient presents with  . Sinus Problem    Patient comes in office with concerns of sinus pressure underneath his eyes for the past 10 days. Patient also reports that two days ago he changed his contacts and yesterday he noticed his eye lids were crusted . This morning patient reports swelling of lid more so on left side and facial pain on left side of face. Patient has take otc Tylenol for pain and relief.   States he has had bloody drainage at one point but not currently. No precedent sore throat or cough. Denies allergy sx but does mow his yard weekly.   Review of Systems  Constitutional: Negative for fever and chills.  HENT: Positive for sinus pressure (left maxillary with tooth involvement).        No changes in vision but has had mild left eye pain, swelling, watery drainage, and photophobia.       Objective:   Physical Exam  Constitutional: He appears well-developed and well-nourished. No distress.  Ears: T.M's obscured by cerumen Throat: no tonsillar enlargement or exudate Neck: no cervical adenopathy Lungs: clear Eyes: PERL, left upper eyelid with mild swelling, sclera is injected with watery drainage. V.A. Is intact to # of fingers.     Assessment:    1. Acute maxillary sinusitis, recurrence not specified - amoxicillin-clavulanate (AUGMENTIN) 875-125 MG per tablet; Take 1 tablet by mouth 2 (two) times daily.  Dispense: 20 tablet; Refill: 0  2. Conjunctivitis of left eye - sulfacetamide (BLEPH-10) 10 % ophthalmic solution; Place 2 drops into the left eye 4 (four) times daily.  Dispense: 10 mL; Refill: 0    Plan:    Discussed use of warm compresses. If eye matting or not improving to start eye abx. If sinuses pressure clearing spontaneously may not require initiation of antibiotic.

## 2014-11-14 NOTE — Patient Instructions (Signed)
Warm compresses several x day.

## 2014-11-25 ENCOUNTER — Other Ambulatory Visit: Payer: Self-pay | Admitting: Family Medicine

## 2014-12-21 ENCOUNTER — Other Ambulatory Visit: Payer: Self-pay | Admitting: Family Medicine

## 2015-01-24 ENCOUNTER — Encounter: Payer: Self-pay | Admitting: Family Medicine

## 2015-01-24 ENCOUNTER — Ambulatory Visit (INDEPENDENT_AMBULATORY_CARE_PROVIDER_SITE_OTHER): Payer: BLUE CROSS/BLUE SHIELD | Admitting: Family Medicine

## 2015-01-24 VITALS — BP 116/60 | HR 74 | Temp 98.0°F | Resp 16 | Wt 311.4 lb

## 2015-01-24 DIAGNOSIS — J0100 Acute maxillary sinusitis, unspecified: Secondary | ICD-10-CM

## 2015-01-24 MED ORDER — FLUTICASONE PROPIONATE 50 MCG/ACT NA SUSP: 2.0000 | Freq: Every day | NASAL | Status: DC

## 2015-01-24 MED ORDER — AMOXICILLIN-POT CLAVULANATE 875-125 MG PO TABS: 1.0000 | ORAL_TABLET | Freq: Two times a day (BID) | ORAL | Status: DC

## 2015-01-24 NOTE — Progress Notes (Signed)
       Patient: Nathan Hull Male    DOB: 29-Aug-1960   54 y.o.   MRN: [id number] Visit Date: 01/24/2015  Today's Provider: Lelon Huh, MD   Chief Complaint  Patient presents with  . Sinusitis   Subjective:    Sinusitis This is a new problem. The current episode started 1 to 4 weeks ago. The problem has been gradually worsening since onset. There has been no fever. Associated symptoms include congestion, sinus pressure and sneezing. Pertinent negatives include no coughing, headaches or sore throat. (Jaws and Teeth hurts really bad. Per patient went to see the Dentist and didn't have no gum disease just a cavity. Per patient everything looks good. ) Past treatments include acetaminophen. The treatment provided no relief.   Taken no OTC medications.     No Known Allergies Previous Medications   ASPIRIN EC 325 MG EC TABLET    Take 1 tablet (325 mg total) by mouth daily.   ATORVASTATIN (LIPITOR) 80 MG TABLET       CLOPIDOGREL (PLAVIX) 75 MG TABLET    TAKE 1 TABLET BY MOUTH DAILY   LISINOPRIL (PRINIVIL,ZESTRIL) 10 MG TABLET    TAKE 1 TABLET BY MOUTH EVERY DAY   METOPROLOL (LOPRESSOR) 50 MG TABLET    Take 50 mg by mouth 2 (two) times daily.     Review of Systems  Constitutional: Negative for fever and fatigue.  HENT: Positive for congestion, postnasal drip (Last week worst than this week.), sinus pressure and sneezing. Negative for sore throat.   Respiratory: Negative for cough.   Neurological: Negative for headaches.    Social History  Substance Use Topics  . Smoking status: Former Smoker -- 2.00 packs/day    Types: Cigarettes    Quit date: 04/23/2005  . Smokeless tobacco: Never Used  . Alcohol Use: 3.6 oz/week    6 Cans of beer per week     Comment: 6 pk  a week   Objective:   BP 116/60 mmHg  Pulse 74  Temp(Src) 98 F (36.7 C) (Oral)  Resp 16  Wt 311 lb 6.4 oz (141.25 kg)  Physical Exam  General Appearance:    Alert, cooperative, no distress  HENT:   bilateral  TM normal without fluid or infection, neck without nodes, throat normal without erythema or exudate, Maxillary sinuses tender, post nasal drip noted and nasal mucosa congested  Eyes:    PERRL, conjunctiva/corneas clear, EOM's intact       Lungs:     Clear to auscultation bilaterally, respirations unlabored  Heart:    Regular rate and rhythm  Neurologic:   Awake, alert, oriented x 3. No apparent focal neurological           defect.           Assessment & Plan:     1. Acute maxillary sinusitis, recurrence not specified  - amoxicillin-clavulanate (AUGMENTIN) 875-125 MG tablet; Take 1 tablet by mouth 2 (two) times daily.  Dispense: 20 tablet; Refill: 0  Call if symptoms change or if not rapidly improving.          Lelon Huh, MD  [facility]

## 2015-02-07 ENCOUNTER — Other Ambulatory Visit: Payer: Self-pay | Admitting: Family Medicine

## 2015-03-29 ENCOUNTER — Other Ambulatory Visit: Payer: Self-pay | Admitting: Family Medicine

## 2015-03-30 ENCOUNTER — Ambulatory Visit (INDEPENDENT_AMBULATORY_CARE_PROVIDER_SITE_OTHER): Payer: BLUE CROSS/BLUE SHIELD | Admitting: Family Medicine

## 2015-03-30 VITALS — BP 140/94 | HR 60 | Temp 97.7°F | Resp 18 | Wt 312.0 lb

## 2015-03-30 DIAGNOSIS — J0191 Acute recurrent sinusitis, unspecified: Secondary | ICD-10-CM | POA: Diagnosis not present

## 2015-03-30 MED ORDER — CEFDINIR 300 MG PO CAPS: 300.0000 mg | ORAL_CAPSULE | Freq: Two times a day (BID) | ORAL | Status: DC

## 2015-03-30 MED ORDER — HYDROCODONE-HOMATROPINE 5-1.5 MG/5ML PO SYRP: ORAL_SOLUTION | ORAL | Status: DC

## 2015-03-30 NOTE — Progress Notes (Signed)
Subjective:     Patient ID: Nathan Hull, male   DOB: 08/11/1960, 54 y.o.   MRN: [id number]  HPI  Chief Complaint  Patient presents with  . Cough  . Fever  States he developed cold symptoms 3 weeks ago. Reports continued purulent sinus drainage and productive cough. He is unable to characterize his sputum. Last treated for a sinus infection with Augmentin two months ago. Has been taking Mucinex and cold preparation for his sx.   Review of Systems  Cardiovascular:       Reports seeing his cardiologist a month ago. Update labs in the new year. He will call for lab slip.       Objective:   Physical Exam  Constitutional: He appears well-developed and well-nourished. No distress.  Ears: T.M's intact without inflammation Sinuses: non-tender Throat: no tonsillar enlargement or exudate Neck: no cervical adenopathy Lungs: clear     Assessment:    1. Acute recurrent sinusitis, unspecified location - cefdinir (OMNICEF) 300 MG capsule; Take 1 capsule (300 mg total) by mouth 2 (two) times daily.  Dispense: 20 capsule; Refill: 0 - HYDROcodone-homatropine (HYCODAN) 5-1.5 MG/5ML syrup; 5 ml 4-6 hours as needed for cough  Dispense: 240 mL; Refill: 0    Plan:    Continue otc medication. Consider ENT referral (patient defers presently) due to inability to clear URI's without complication.

## 2015-03-30 NOTE — Patient Instructions (Signed)
Continue Mucinex and decongestants.

## 2015-05-26 ENCOUNTER — Ambulatory Visit
Admission: EM | Admit: 2015-05-26 | Discharge: 2015-05-26 | Disposition: A | Discharge status: Home / Self Care | Payer: BLUE CROSS/BLUE SHIELD | Attending: Family Medicine | Admitting: Family Medicine

## 2015-05-26 ENCOUNTER — Encounter: Payer: Self-pay | Admitting: Emergency Medicine

## 2015-05-26 DIAGNOSIS — M6281 Muscle weakness (generalized): Secondary | ICD-10-CM | POA: Insufficient documentation

## 2015-05-26 DIAGNOSIS — R252 Cramp and spasm: Secondary | ICD-10-CM

## 2015-05-26 LAB — URINALYSIS COMPLETE WITH MICROSCOPIC (ARMC ONLY)
BILIRUBIN URINE: NEGATIVE
Bacteria, UA: NONE SEEN
Glucose, UA: NEGATIVE mg/dL
Hgb urine dipstick: NEGATIVE
KETONES UR: NEGATIVE mg/dL
Leukocytes, UA: NEGATIVE
Nitrite: NEGATIVE
Protein, ur: NEGATIVE mg/dL
RBC / HPF: NONE SEEN RBC/hpf (ref 0–5)
Squamous Epithelial / LPF: NONE SEEN
WBC, UA: NONE SEEN WBC/hpf (ref 0–5)
pH: 6.5 (ref 5.0–8.0)

## 2015-05-26 LAB — CBC WITH DIFFERENTIAL/PLATELET
BASOS PCT: 1 %
Basophils Absolute: 0 10*3/uL (ref 0–0.1)
EOS ABS: 0 10*3/uL (ref 0–0.7)
Eosinophils Relative: 1 %
HCT: 42.3 % (ref 40.0–52.0)
HEMOGLOBIN: 14.2 g/dL (ref 13.0–18.0)
Lymphocytes Relative: 22 %
Lymphs Abs: 1.4 10*3/uL (ref 1.0–3.6)
MCH: 28.8 pg (ref 26.0–34.0)
MCHC: 33.5 g/dL (ref 32.0–36.0)
MCV: 86 fL (ref 80.0–100.0)
Monocytes Absolute: 0.4 10*3/uL (ref 0.2–1.0)
Monocytes Relative: 7 %
Neutro Abs: 4.5 10*3/uL (ref 1.4–6.5)
Neutrophils Relative %: 71 %
PLATELETS: 196 10*3/uL (ref 150–440)
RBC: 4.92 MIL/uL (ref 4.40–5.90)
RDW: 13.6 % (ref 11.5–14.5)
WBC: 6.3 10*3/uL (ref 3.8–10.6)

## 2015-05-26 LAB — BASIC METABOLIC PANEL
Anion gap: 9 (ref 5–15)
BUN: 20 mg/dL (ref 6–20)
CALCIUM: 9.1 mg/dL (ref 8.9–10.3)
CO2: 26 mmol/L (ref 22–32)
CREATININE: 1.09 mg/dL (ref 0.61–1.24)
Chloride: 98 mmol/L — ABNORMAL LOW (ref 101–111)
GFR calc Af Amer: >60 mL/min (ref >60)
Glucose, Bld: 132 mg/dL — ABNORMAL HIGH (ref 65–99)
Potassium: 3.7 mmol/L (ref 3.5–5.1)
SODIUM: 133 mmol/L — AB (ref 135–145)

## 2015-05-26 LAB — CKMB (ARMC ONLY): CK, MB: 5.2 ng/mL — AB (ref 0.5–5.0)

## 2015-05-26 LAB — MAGNESIUM: Magnesium: 1.9 mg/dL (ref 1.7–2.4)

## 2015-05-26 NOTE — Discharge Instructions (Signed)
Rest. Eat and drink regularly. Drink plenty of fluids.  Follow-up closely with your primary care physician in 2-3 days.  Return to urgent care or proceed to ER for chest pain, shortness of breath, weakness, dizziness, vision changes, increased muscle pain, new or worsening concerns.  Weakness Weakness is a lack of strength. It may be felt all over the body (generalized) or in one specific part of the body (focal). Some causes of weakness can be serious. You may need further medical evaluation, especially if you are elderly or you have a history of immunosuppression (such as chemotherapy or HIV), kidney disease, heart disease, or diabetes. CAUSES  Weakness can be caused by many different things, including:  Infection.  Physical exhaustion.  Internal bleeding or other blood loss that results in a lack of red blood cells (anemia).  Dehydration. This cause is more common in elderly people.  Side effects or electrolyte abnormalities from medicines, such as pain medicines or sedatives.  Emotional distress, anxiety, or depression.  Circulation problems, especially severe peripheral arterial disease.  Heart disease, such as rapid atrial fibrillation, bradycardia, or heart failure.  Nervous system disorders, such as Guillain-Barr syndrome, multiple sclerosis, or stroke. DIAGNOSIS  To find the cause of your weakness, your caregiver will take your history and perform a physical exam. Lab tests or X-rays may also be ordered, if needed. TREATMENT  Treatment of weakness depends on the cause of your symptoms and can vary greatly. HOME CARE INSTRUCTIONS   Rest as needed.  Eat a well-balanced diet.  Try to get some exercise every day.  Only take over-the-counter or prescription medicines as directed by your caregiver. SEEK MEDICAL CARE IF:   Your weakness seems to be getting worse or spreads to other parts of your body.  You develop new aches or pains. SEEK IMMEDIATE MEDICAL CARE IF:     You cannot perform your normal daily activities, such as getting dressed and feeding yourself.  You cannot walk up and down stairs, or you feel exhausted when you do so.  You have shortness of breath or chest pain.  You have difficulty moving parts of your body.  You have weakness in only one area of the body or on only one side of the body.  You have a fever.  You have trouble speaking or swallowing.  You cannot control your bladder or bowel movements.  You have black or bloody vomit or stools. MAKE SURE YOU:  Understand these instructions.  Will watch your condition.  Will get help right away if you are not doing well or get worse.   This information is not intended to replace advice given to you by your health care provider. Make sure you discuss any questions you have with your health care provider.   Document Released: 03/18/2005 Document Revised: 09/17/2011 Document Reviewed: 05/17/2011 Elsevier Interactive Patient Education 2016 [employer].  Weakness Weakness is a lack of strength. It may be felt all over the body (generalized) or in one specific part of the body (focal). Some causes of weakness can be serious. You may need further medical evaluation, especially if you are elderly or you have a history of immunosuppression (such as chemotherapy or HIV), kidney disease, heart disease, or diabetes. CAUSES  Weakness can be caused by many different things, including:  Infection.  Physical exhaustion.  Internal bleeding or other blood loss that results in a lack of red blood cells (anemia).  Dehydration. This cause is more common in elderly people.  Side effects  or electrolyte abnormalities from medicines, such as pain medicines or sedatives.  Emotional distress, anxiety, or depression.  Circulation problems, especially severe peripheral arterial disease.  Heart disease, such as rapid atrial fibrillation, bradycardia, or heart failure.  Nervous system  disorders, such as Guillain-Barr syndrome, multiple sclerosis, or stroke. DIAGNOSIS  To find the cause of your weakness, your caregiver will take your history and perform a physical exam. Lab tests or X-rays may also be ordered, if needed. TREATMENT  Treatment of weakness depends on the cause of your symptoms and can vary greatly. HOME CARE INSTRUCTIONS   Rest as needed.  Eat a well-balanced diet.  Try to get some exercise every day.  Only take over-the-counter or prescription medicines as directed by your caregiver. SEEK MEDICAL CARE IF:   Your weakness seems to be getting worse or spreads to other parts of your body.  You develop new aches or pains. SEEK IMMEDIATE MEDICAL CARE IF:   You cannot perform your normal daily activities, such as getting dressed and feeding yourself.  You cannot walk up and down stairs, or you feel exhausted when you do so.  You have shortness of breath or chest pain.  You have difficulty moving parts of your body.  You have weakness in only one area of the body or on only one side of the body.  You have a fever.  You have trouble speaking or swallowing.  You cannot control your bladder or bowel movements.  You have black or bloody vomit or stools. MAKE SURE YOU:  Understand these instructions.  Will watch your condition.  Will get help right away if you are not doing well or get worse.   This information is not intended to replace advice given to you by your health care provider. Make sure you discuss any questions you have with your health care provider.   Document Released: 03/18/2005 Document Revised: 09/17/2011 Document Reviewed: 05/17/2011 Elsevier Interactive Patient Education 2016 Else[employer] Inc[employer]  Muscle Cramps and Spasms Muscle cramps and spasms occur when a muscle or muscles tighten and you have no control over this tightening (involuntary muscle contraction). They are a common problem and can develop in any muscle. The most  common place is in the calf muscles of the leg. Both muscle cramps and muscle spasms are involuntary muscle contractions, but they also have differences:   Muscle cramps are sporadic and painful. They may last a few seconds to a quarter of an hour. Muscle cramps are often more forceful and last longer than muscle spasms.  Muscle spasms may or may not be painful. They may also last just a few seconds or much longer. CAUSES  It is uncommon for cramps or spasms to be due to a serious underlying problem. In many cases, the cause of cramps or spasms is unknown. Some common causes are:   Overexertion.   Overuse from repetitive motions (doing the same thing over and over).   Remaining in a certain position for a long period of time.   Improper preparation, form, or technique while performing a sport or activity.   Dehydration.   Injury.   Side effects of some medicines.   Abnormally low levels of the salts and ions in your blood (electrolytes), especially potassium and calcium. This could happen if you are taking water pills (diuretics) or you are pregnant.  Some underlying medical problems can make it more likely to develop cramps or spasms. These include, but are not limited to:   Diabetes.  Parkinson disease.   Hormone disorders, such as thyroid problems.   Alcohol abuse.   Diseases specific to muscles, joints, and bones.   Blood vessel disease where not enough blood is getting to the muscles.  HOME CARE INSTRUCTIONS   Stay well hydrated. Drink enough water and fluids to keep your urine clear or pale yellow.  It may be helpful to massage, stretch, and relax the affected muscle.  For tight or tense muscles, use a warm towel, heating pad, or hot shower water directed to the affected area.  If you are sore or have pain after a cramp or spasm, applying ice to the affected area may relieve discomfort.  Put ice in a plastic bag.  Place a towel between your skin  and the bag.  Leave the ice on for 15-20 minutes, 03-04 times a day.  Medicines used to treat a known cause of cramps or spasms may help reduce their frequency or severity. Only take over-the-counter or prescription medicines as directed by your caregiver. SEEK MEDICAL CARE IF:  Your cramps or spasms get more severe, more frequent, or do not improve over time.  MAKE SURE YOU:   Understand these instructions.  Will watch your condition.  Will get help right away if you are not doing well or get worse.   This information is not intended to replace advice given to you by your health care provider. Make sure you discuss any questions you have with your health care provider.   Document Released: 09/07/2001 Document Revised: 07/13/2012 Document Reviewed: 03/04/2012 Elsevier Interactive Patient Education [employer].

## 2015-05-26 NOTE — ED Provider Notes (Signed)
[facility]  ____________________________________________  Time seen: Approximately 6:30 PM  I have reviewed the triage vital signs and the nursing notes.   HISTORY  Chief Complaint Abdominal Cramping   HPI Nathan Hull is a 55 y.o. male presents for the complaints of muscle cramps. Patient states that yesterday he did a few hours of yard work that he had not done in a while and reports that he also did 4-5 hours of yard work today. States he was doing a lot of bending, lifting and squatting and using various muscle groups.  Patient states that after working in the yard yesterday he had some generalized soreness that he described as muscular and reports that he was having generalized right leg cramps. Patient states that after working 4-5 hours outside today and sweating a lot states that he was having full body muscular cramps. Patient states that he intermittently felt like he was having spasms in several different muscles. Patient states that these spasms were triggered by activity and movements. Patient states at rest his pain was much improved and minimal. Stretching helped with pain.   Patient reports that he has a history of this happening many times before. 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 States at the time when he is having muscle cramps he feels tired but reports after the cramps he feels better. Patient states at this time he is not having any muscular or other pain but states that his muscles all over his body feels sore. States earlier today his urine was darker colored but states has improved now. States has urinated twice in urgent care and urine was clear.   Patient states while sitting in urgent care he has drank over one whole bottle of Pedialyte and states he is  feeling much better. Patient denies any pain at this time but states that his muscles to feel somewhat sore as if he "worked out too much ".  Patient states that he has not had any chest pain, shortness of breath, headaches, numbness, tingling sensation, dizziness, weakness, confusion, fall, head injury, extremity swelling, extremity rash. Denies syncope or near syncope. Denies palpitations. Denies fall or head injury. Reports has been drinking fluids well today. Denies nausea, vomiting, constipation or diarrhea. Denies fever. Denies recent sickness.  [id number] Cardiology:[facility]   Past Medical History  Diagnosis Date  . CAD (coronary artery disease)   . Dyspnea   . Hypertension   . Hyperlipidemia   . Obesity   . Myocardial infarct ([place])   . Family history of anesthesia complication     mother had N/V after valve replacement    Patient Active Problem List   Diagnosis Date Noted  . Well controlled diabetes mellitus ([place]) 11/14/2014  . Extreme obesity ([place]) 11/14/2014  . Cellulitis 10/08/2013  . Atrial fibrillation ([place]) 09/30/2013  . 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 CAD (coronary artery disease) 09/20/2013  . Heart disease 07/06/2009  . Drug withdrawal ([place]) 12/29/2008  . HLD (hyperlipidemia) 06/19/2006  . Benign essential HTN 06/19/2006  . Idiopathic peripheral neuropathy ([place]) 12/30/2005  . 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 [facility] at [facility]      04/28/2005  . C-spine disc replacement x 2  06/16/2012  . Carpal tunnel release      on right  . Tonsillectomy    . Cardiac catheterization      last week @ [facility]  . Cardiac stents      has been cathed x 4   Had  MI  in 2007  . Coronary artery bypass graft N/A 09/24/2013    Procedure: CORONARY ARTERY BYPASS GRAFTING (CABG) x three, using LIMA to LAD, and right leg greater saphenous vein harvested endoscopically - SVG to Ramus, SVG to PL;  Surgeon: [name], MD;  Location:  [place];  Service: Open Heart Surgery;  Laterality: N/A;  . Intraoperative transesophageal echocardiogram N/A 09/24/2013    Procedure: INTRAOPERATIVE TRANSESOPHAGEAL ECHOCARDIOGRAM;  Surgeon: [name], MD;  Location: [place];  Service: Open Heart Surgery;  Laterality: N/A;  . I&d extremity Right 10/22/2013    Procedure: IRRIGATION AND DEBRIDEMENT EXTREMITY-RIGHT LEG;  Surgeon: [name], MD;  Location: [facility];  Service: Vascular;  Laterality: Right;  . Application of wound vac Right 10/22/2013    Procedure: APPLICATION OF WOUND VAC;  Surgeon: [name], MD;  Location: [place];  Service: Vascular;  Laterality: Right;  . Spinal fusion      vertebra, congentital : Dr. Annette Stable     Current Outpatient Rx  Name  Route  Sig  Dispense  Refill  . aspirin EC 325 MG EC tablet   Oral   Take 1 tablet (325 mg total) by mouth daily.         Marland Kitchen atorvastatin (LIPITOR) 80 MG tablet      1 (ONE) TABLET TABLET, ORAL, DAILY   90 tablet   3   .           . clopidogrel (PLAVIX) 75 MG tablet      TAKE 1 TABLET BY MOUTH DAILY   90 tablet   3   . lisinopril (PRINIVIL,ZESTRIL) 10 MG tablet      TAKE 1 TABLET BY MOUTH EVERY DAY   90 tablet   3   . metoprolol (LOPRESSOR) 50 MG tablet      1 TABLET, ORAL, TWO TIMES DAILY   180 tablet   3   .           .             Allergies Review of patient's allergies indicates no known allergies.  Family History  Problem Relation Age of Onset  . Hypertension Mother   . Hypertension Father   . Rheumatic fever Mother     S/P MVR    Social History Social History  Substance Use Topics  . Smoking status: Former Smoker -- 2.00 packs/day    Types: Cigarettes    Quit date: 04/23/2005  . Smokeless tobacco: Never Used  . Alcohol Use: 3.6 oz/week    6 Cans of beer per week     Comment: 6 pk  a week  Denies drug use.   Review of Systems Constitutional: No fever/chills Eyes: No visual changes. ENT: No sore throat. Cardiovascular: Denies  chest pain. Respiratory: Denies shortness of breath. Gastrointestinal: No abdominal pain.  No nausea, no vomiting.  No diarrhea.  No constipation. Genitourinary: Negative for dysuria. Musculoskeletal: Negative for back pain. Skin: Negative for rash. Neurological: Negative for headaches, focal weakness or numbness.  10-point ROS otherwise negative.  ____________________________________________   PHYSICAL EXAM:  VITAL SIGNS: ED Triage Vitals  Enc Vitals Group     BP 05/26/15  1730 151/90 mmHg     Pulse Rate 05/26/15 1730 103 Recheck 94     Resp 05/26/15 1727 20     Temp 05/26/15 1727 98.3 F (36.8 C)     Temp Source 05/26/15 1727 Tympanic     SpO2 05/26/15 1730 96 %     Weight 05/26/15 1723 302 lb (136.986 kg)     Height 05/26/15 1723 6" (0.152 m)     Head Cir --      Peak Flow --      Pain Score 05/26/15 1732 3     Pain Loc --      Pain Edu? --      Excl. in [place]? --     Constitutional: Alert and oriented. Well appearing and in no acute distress. Eyes: Conjunctivae are normal. PERRL. EOMI. Head: Atraumatic.  Ears: no erythema, normal TMs bilaterally.   Nose: No congestion/rhinnorhea.  Mouth/Throat: Mucous membranes are moist.  Oropharynx non-erythematous. No tonsillar swelling or exudate.  Neck: No stridor.  No cervical spine tenderness to palpation. Hematological/Lymphatic/Immunilogical: No cervical lymphadenopathy. Cardiovascular: Normal rate, regular rhythm. Grossly normal heart sounds.  Good peripheral circulation. Respiratory: Normal respiratory effort.  No retractions. Lungs CTAB. No wheezes, rales or rhonchi.  Gastrointestinal: Soft and nontender. Obese abdomen. Normal Bowel sounds. No CVA tenderness. Musculoskeletal: No lower or upper extremity tenderness nor edema.  No calf tenderness to palpation. Bilateral pedal pulses equal and easily palpated. 5/5 strength to bilateral upper and lower extremities. Steady gait. No cervical, thoracic, or lumbar tenderness to  palpation.  Neurologic:  Normal speech and language. No gross focal neurologic deficits are appreciated. No gait instability. Skin:  Skin is warm, dry and intact. No rash noted. Psychiatric: Mood and affect are normal. Speech and behavior are normal.  ____________________________________________   LABS (all labs ordered are listed, but only abnormal results are displayed)  Labs Reviewed  BASIC METABOLIC PANEL - Abnormal; Notable for the following:    Sodium 133 (*)    Chloride 98 (*)    Glucose, Bld 132 (*)    All other components within normal limits  CKMB([facility] ONLY) - Abnormal; Notable for the following:    CK, MB 5.2 (*)    All other components within normal limits  URINALYSIS COMPLETEWITH MICROSCOPIC ([facility] ONLY) - Abnormal; Notable for the following:    Color, Urine STRAW (*)    Specific Gravity, Urine <1.005 (*)    All other components within normal limits  CBC WITH DIFFERENTIAL/PLATELET  MAGNESIUM   ____________________________________________  EKG  ED ECG REPORT I, Marylene Land, the attending provider and Dr Zenda Alpers, personally viewed and interpreted this ECG.   Date: 05/26/2015  EKG Time:1915  Rate: 100  Rhythm:  normal sinus rhythm, unchanged from previous tracings, incomplete right bundle branch block  Axis: normal  Intervals:incomplete right bundle branch block  ST&T Change: none Compared with Dr Zenda Alpers to previous EKGs from 09/28/2013 and 07/10/2006 without changes.  ____________________________________________  INITIAL IMPRESSION / ASSESSMENT AND PLAN / ED COURSE  Pertinent labs & imaging results that were available during my care of the patient were reviewed by me and considered in my medical decision making (see chart for details).  Very well-appearing patient. No acute distress. Drinking fluids in room. Presents for the complaint of generalized body and muscle cramps after what he describes of strenuous working outside of the last 2 days. States that  currently he is not in any pain and states that his muscle cramps have improved and resolved.  States currently he is only having some leftover muscle soreness as he describes. Patient states that he is feeling much better after drinking Pedialyte in urgent care and drinking more water. Reports history of this happening multiple times in the past. Denies chest pain, shortness of breath, abdominal pain dizziness or weakness. Lungs clear throughout. Abdomen soft and nontender. No tenderness on exam. Discussed patient and plan of care with Dr. Zenda Alpers agrees with plan. Will evaluate labs including CK and urinalysis. Suspect overuse, strenuous activity exhaustion.  Labs reviewed. Discussed with patient. Patient continues to state that he is feeling better. Denies pain. Suspect over strenuous activity leading to complaints. As patient reports that he is feeling much better and eating and drinking well. Reports at this time that even his muscle soreness has improved. Denies other complaints. Ambulatory in room a steady gait. Changes positions quickly without discomfort or distress. Family at bedside.   Encouraged rest and fluids. Encouraged patient to follow-up closely with his primary care physician this coming week. Discussed follow up with Primary care physician this week. Discussed follow up and return parameters to urgent care or ER including chest pain, nausea, vomiting, diarrhea, dizziness, shortness of breath, weakness, muscle pain, no resolution or any worsening concerns. Patient verbalized understanding and agreed to plan.   ____________________________________________   FINAL CLINICAL IMPRESSION(S) / ED DIAGNOSES  Final diagnoses:  Muscle cramps      Note: This dictation was prepared with Dragon dictation along with smaller phrase technology. Any transcriptional errors that result from this process are unintentional.    Marylene Land, NP 05/26/15 2158

## 2015-05-26 NOTE — ED Notes (Signed)
Patient states he has a history of cramps when he sweats a lot.Today pat. States he was working in the yard and developed sever cramps all over his body

## 2015-06-30 IMAGING — CR DG CHEST 2V
2 series · 2 of 2 positions shown · non-contrast
Comparison: 09/09/2008

CLINICAL DATA: Preop heart surgery

EXAM:
CHEST  2 VIEW

[w chest pa]
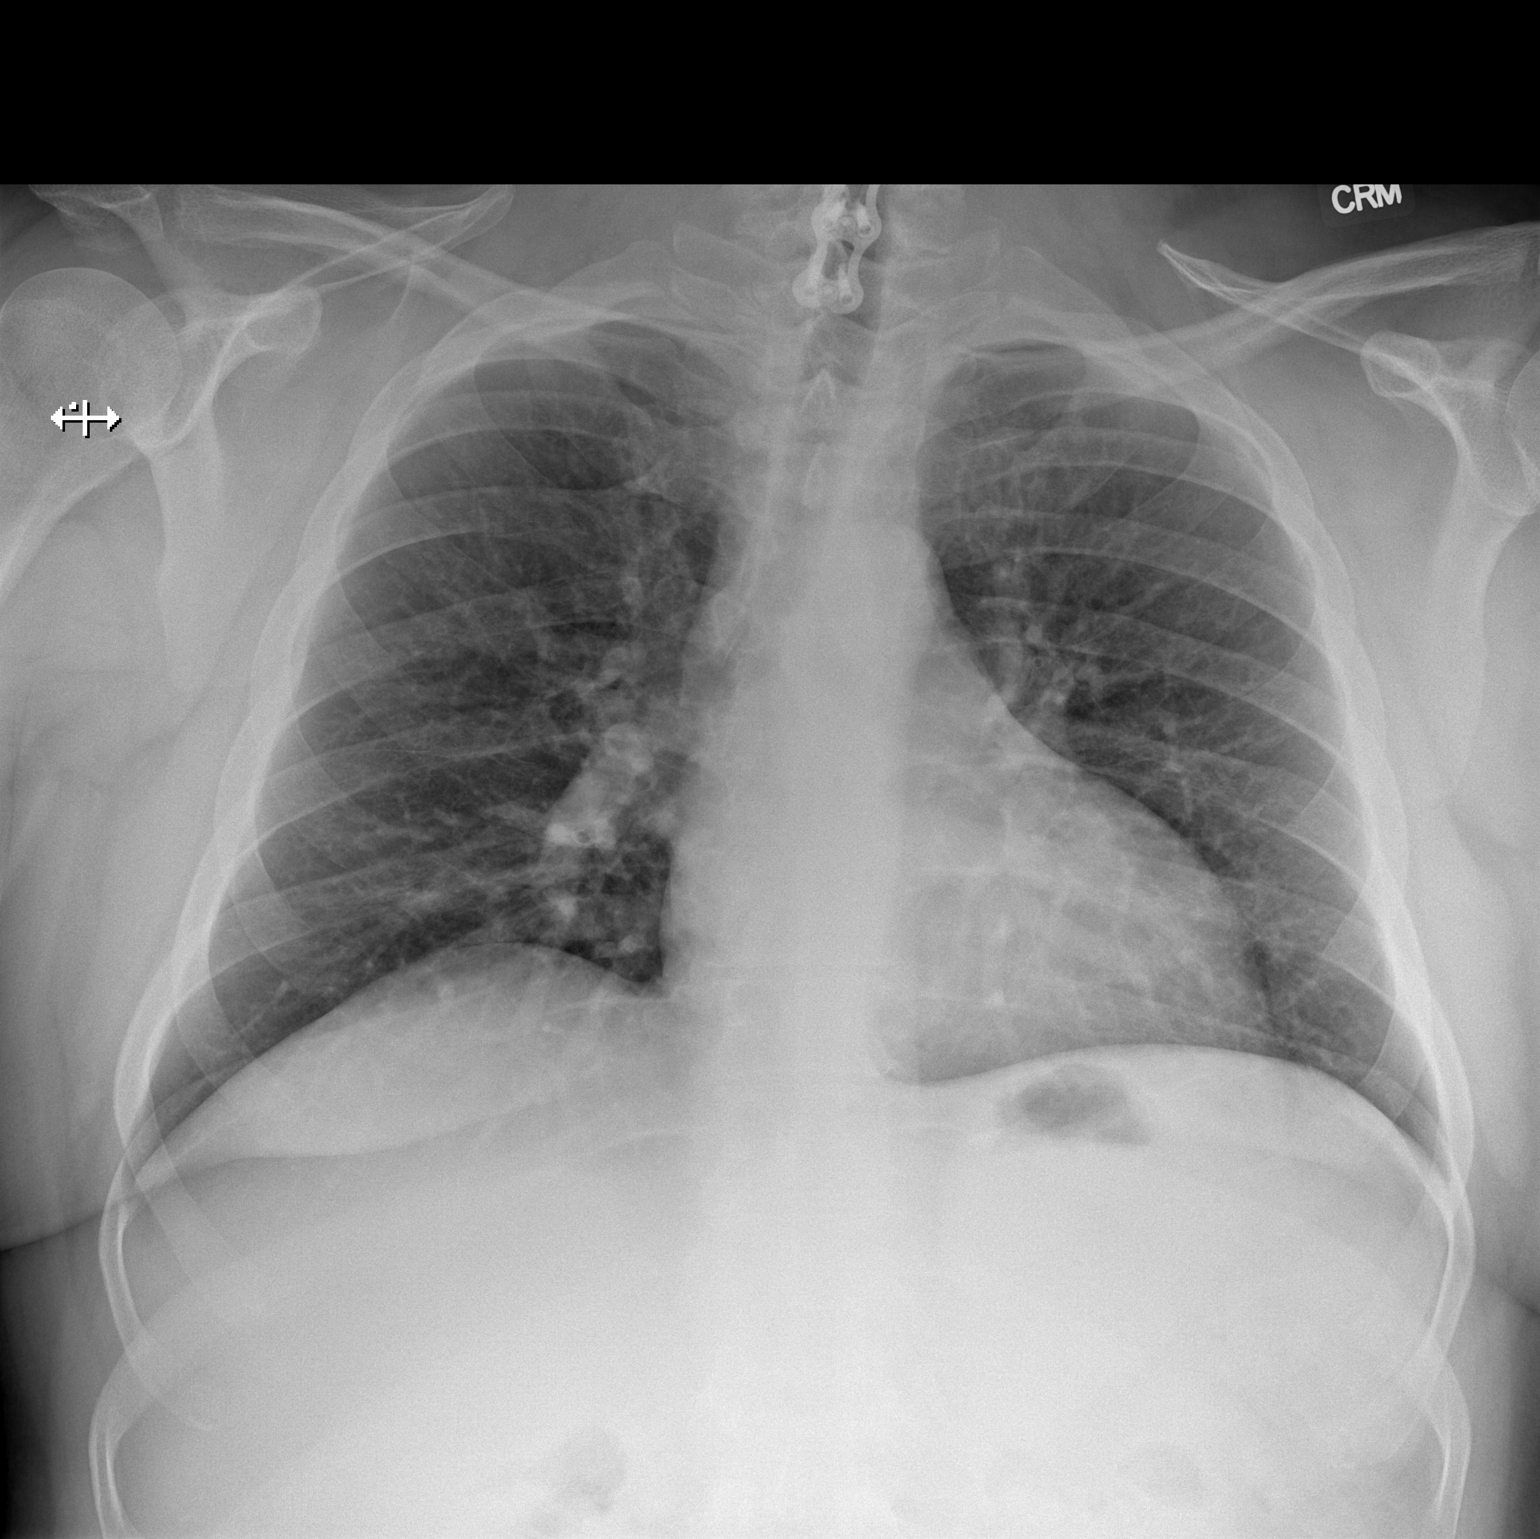

[w chest lat]
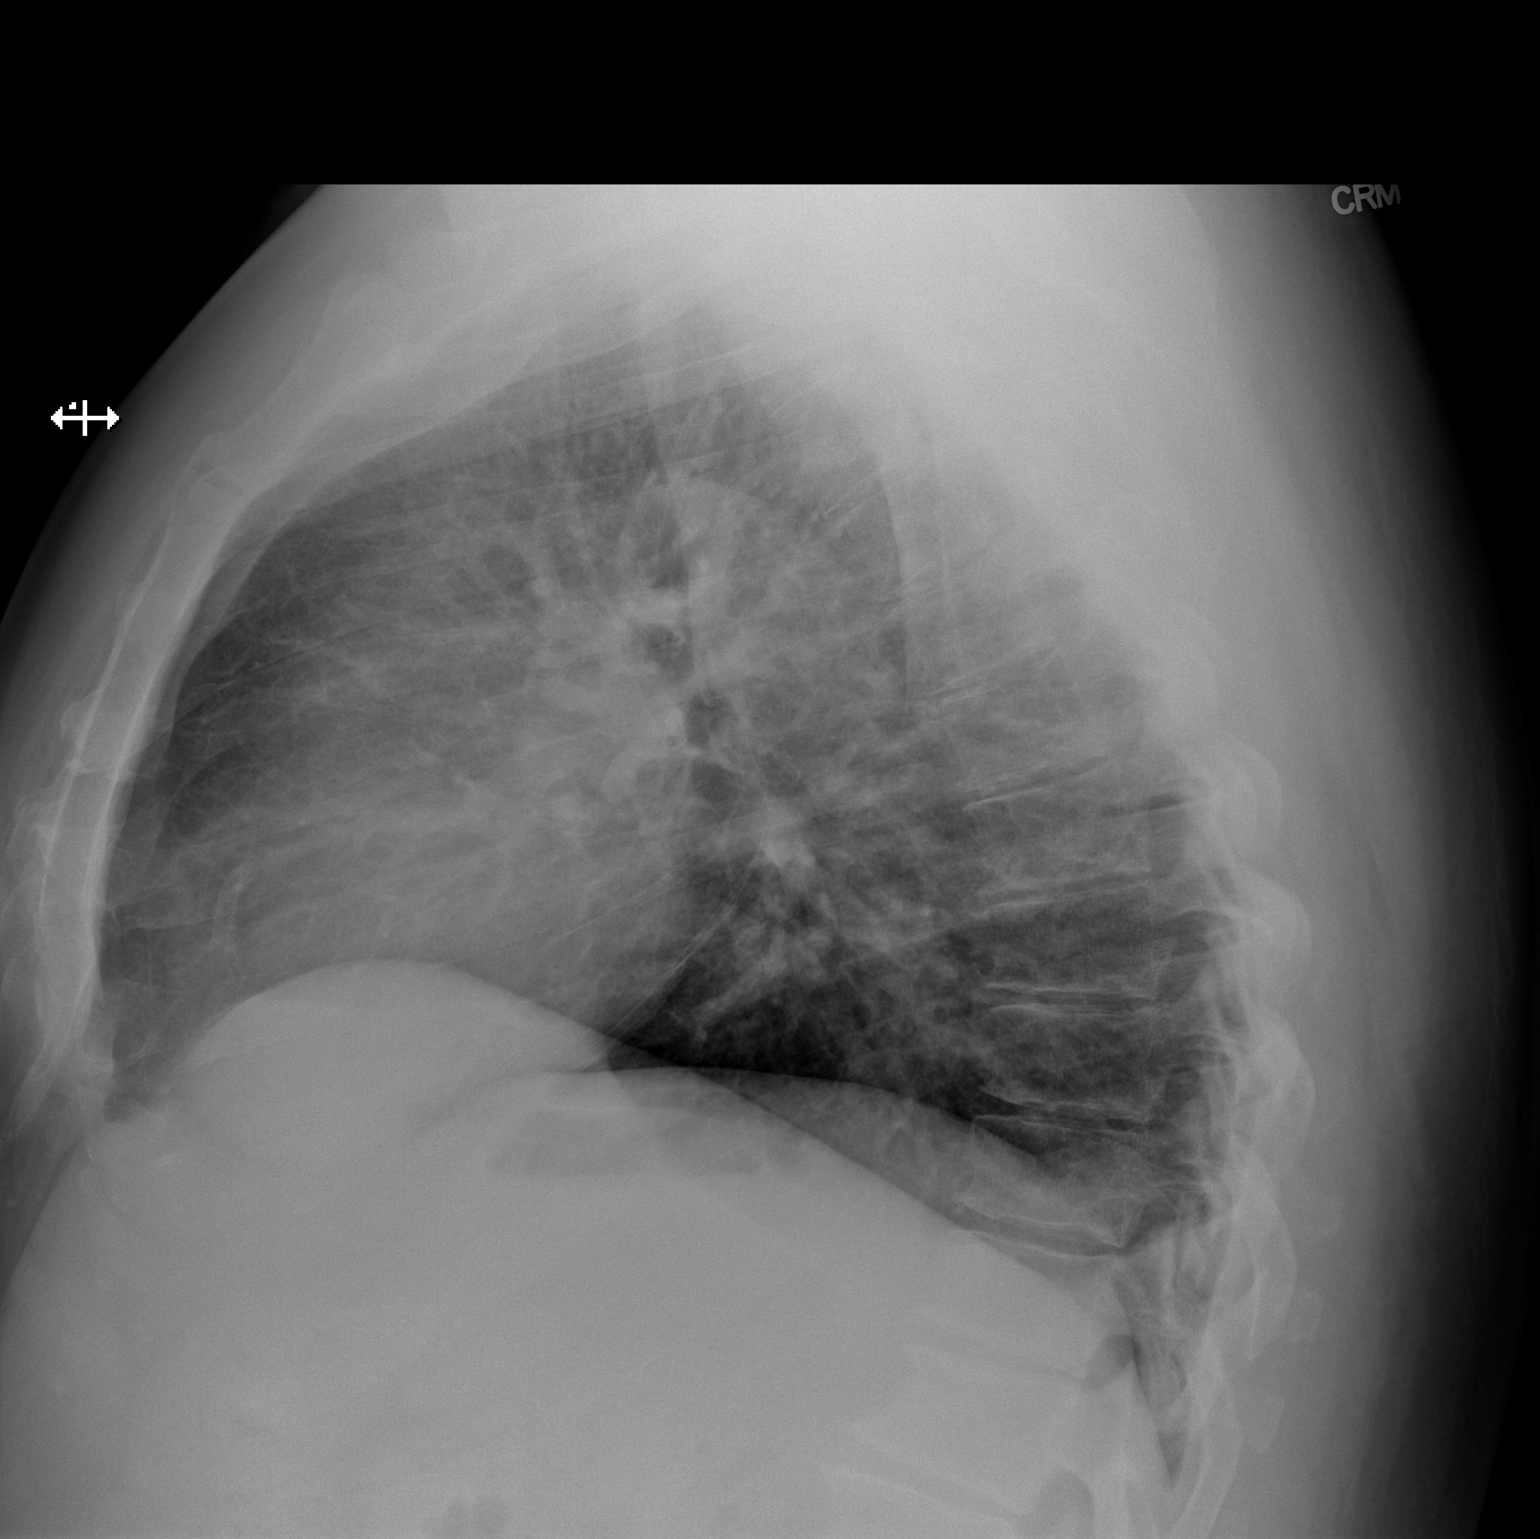

[2 of 2 positions shown; findings below may reference images not displayed]

FINDINGS: Mild cardiac enlargement with normal vascularity. Left coronary
artery stent. Lungs are clear. No infiltrate or mass lesion.
Cervical fusion.
IMPRESSION: No active cardiopulmonary disease.

## 2015-07-02 IMAGING — CR DG CHEST 1V PORT
2 series · 2 of 2 positions shown · non-contrast
Comparison: Chest radiograph September 22, 2013

CLINICAL DATA: Postoperative evaluation.

EXAM:
PORTABLE CHEST - 1 VIEW

[AP (1 of 2)]
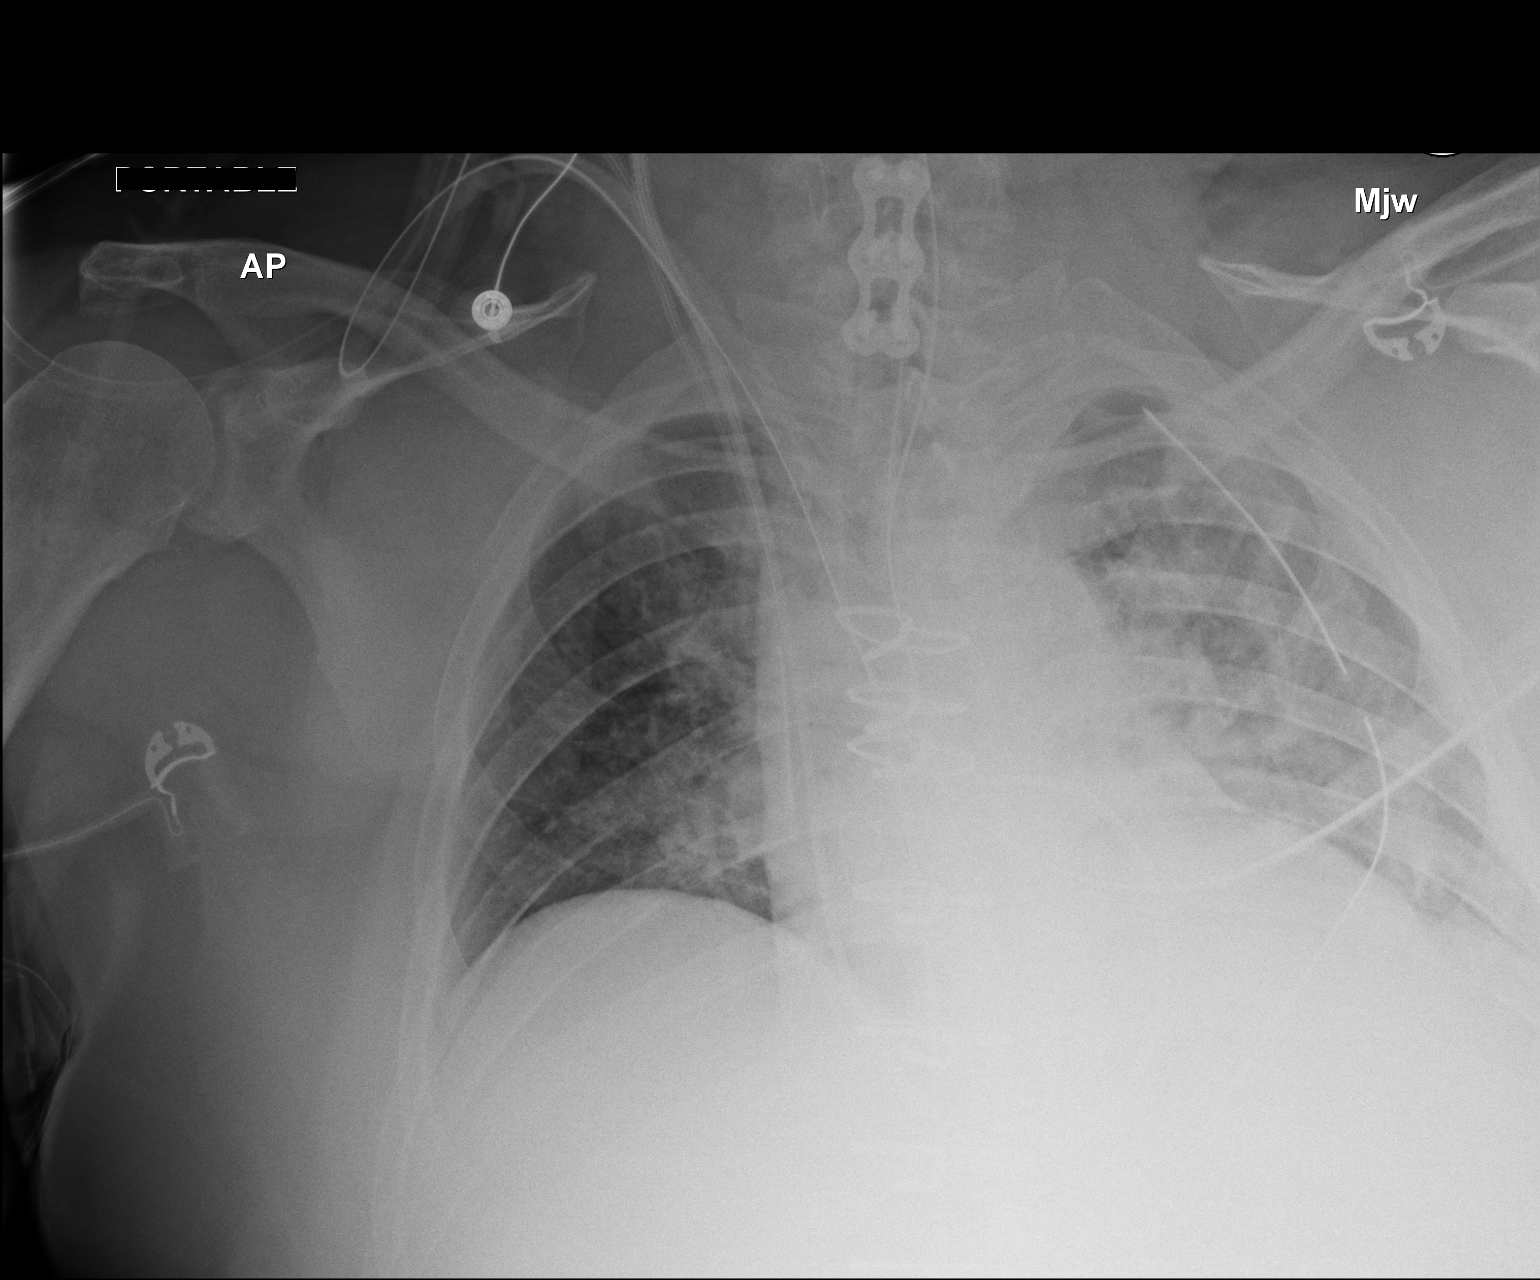

[AP (2 of 2)]
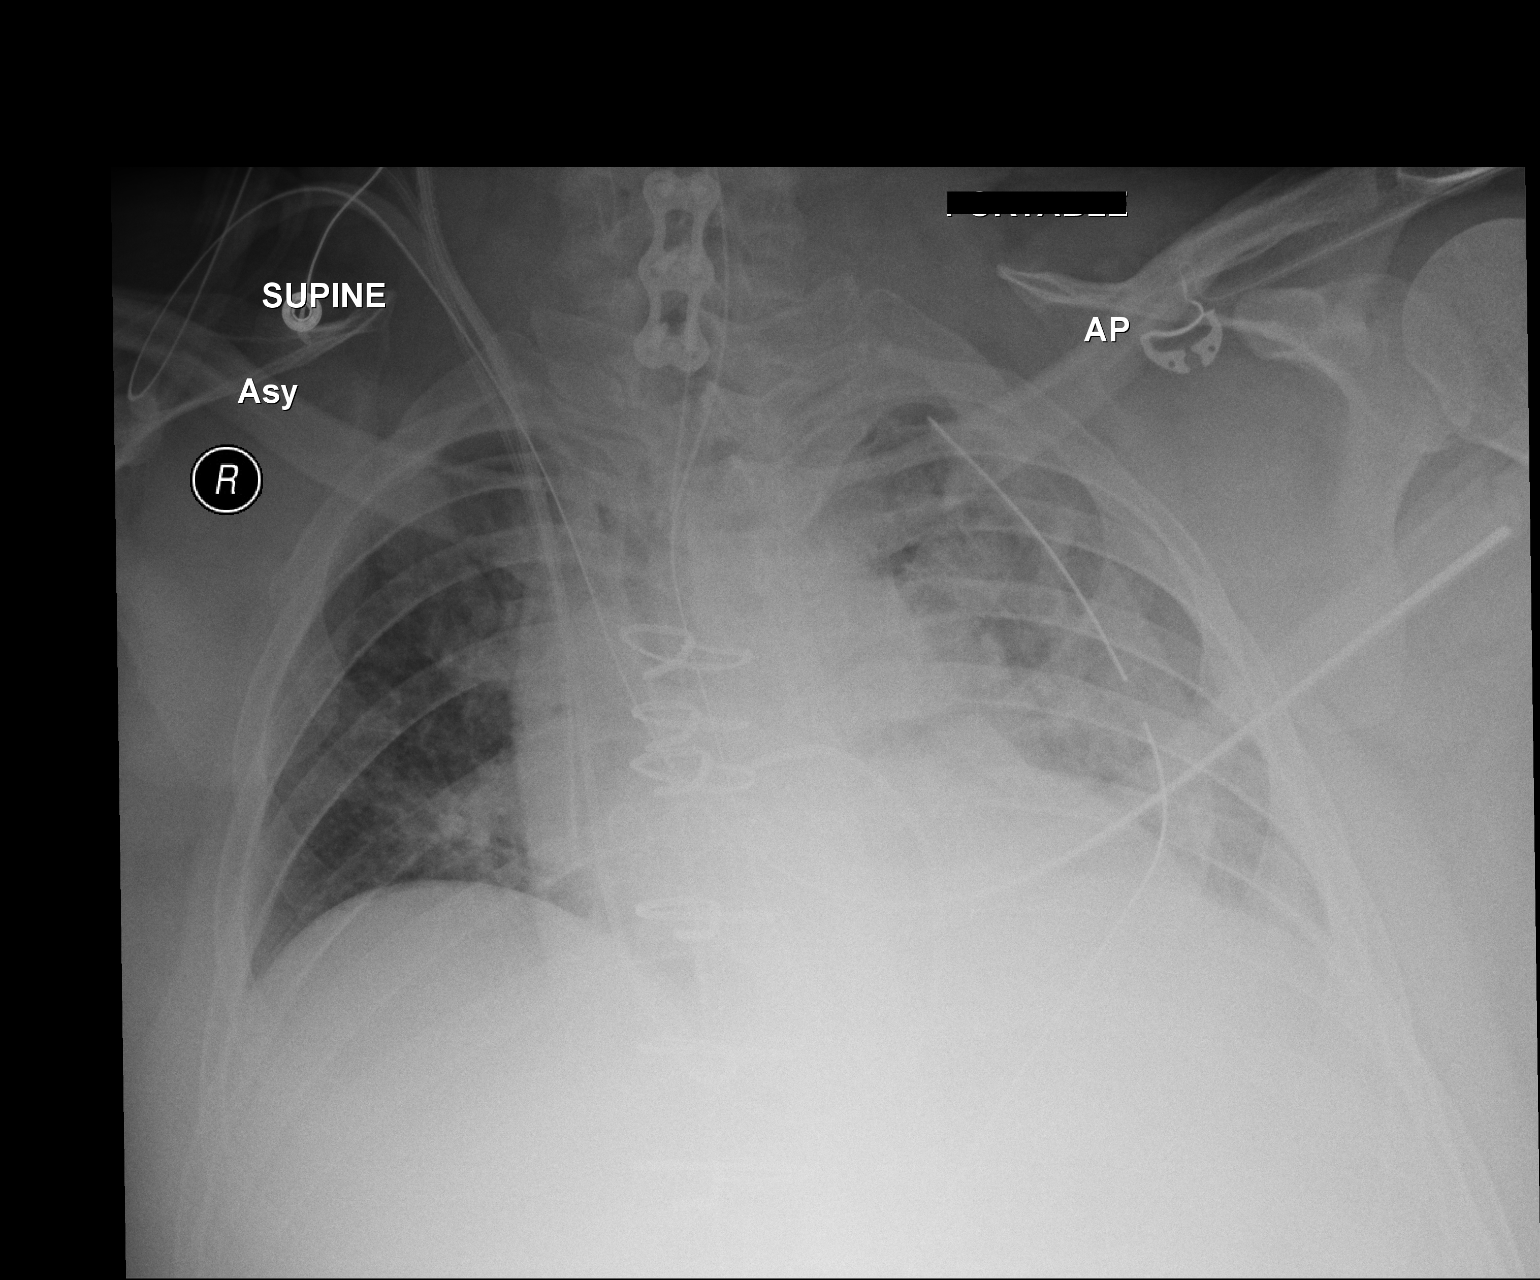

[2 of 2 positions shown; findings below may reference images not displayed]

FINDINGS: Status post interval median sternotomy. Endotracheal tube tip
projects 4.4 cm above the carina. Swans Ganz catheter of the airway
right internal jugular sheath with distal tip projecting midline in
the main pulmonary artery region. Right internal jugular central
venous catheter with distal tip projecting at cavoatrial junction.
Left apical chest tube with side port projecting within the chest
wall. Nasogastric tube past the GE junction, distal tip not imaged.

Cardiac silhouette appears mildly enlarged, accentuated by low
inspiratory portable examination with crowded vasculature markings,
central pulmonary vasculature congestion. Retrocardiac patchy
airspace opacity. No pleural effusions. No pneumothorax.

Soft tissue planes and included osseous structures are
nonsuspicious.
IMPRESSION: Status post interval median sternotomy, Mild cardiomegaly, central
pulmonary vasculature congestion with retrocardiac probable
atelectasis.

Endotracheal tube tip projects 4.4 cm above the carina. Swans Ganz
catheter of the airway right internal jugular sheath with distal tip
projecting midline in the main pulmonary artery region. Right
internal jugular central venous catheter with distal tip projecting
at cavoatrial junction. Left apical chest tube with side port
projecting within the chest wall. Nasogastric tube past the GE
junction, distal tip not imaged.

These results will be called to the ordering clinician or
representative by the Radiologist Assistant, and communication
documented in the PACS or zVision Dashboard.

  By: Jon G Salsola

## 2015-07-05 ENCOUNTER — Telehealth: Payer: Self-pay | Admitting: Family Medicine

## 2015-07-05 ENCOUNTER — Other Ambulatory Visit: Payer: Self-pay | Admitting: Family Medicine

## 2015-07-05 DIAGNOSIS — E119 Type 2 diabetes mellitus without complications: Secondary | ICD-10-CM

## 2015-07-05 DIAGNOSIS — E1169 Type 2 diabetes mellitus with other specified complication: Secondary | ICD-10-CM

## 2015-07-05 DIAGNOSIS — I10 Essential (primary) hypertension: Secondary | ICD-10-CM

## 2015-07-05 DIAGNOSIS — E785 Hyperlipidemia, unspecified: Secondary | ICD-10-CM

## 2015-07-05 NOTE — Telephone Encounter (Signed)
Lab slip up front for pickup. 

## 2015-07-05 NOTE — Telephone Encounter (Signed)
Patient advised lab slip is at front desk for pick up.

## 2015-07-05 NOTE — Telephone Encounter (Signed)
Please review

## 2015-07-05 NOTE — Telephone Encounter (Signed)
Pt stated it is time for him to get his labs done and he would like to pick up a lab slip. Thanks TNP

## 2015-07-06 ENCOUNTER — Other Ambulatory Visit: Payer: Self-pay | Admitting: Family Medicine

## 2015-07-06 DIAGNOSIS — I10 Essential (primary) hypertension: Secondary | ICD-10-CM | POA: Diagnosis not present

## 2015-07-06 DIAGNOSIS — E119 Type 2 diabetes mellitus without complications: Secondary | ICD-10-CM | POA: Diagnosis not present

## 2015-07-06 DIAGNOSIS — E785 Hyperlipidemia, unspecified: Secondary | ICD-10-CM | POA: Diagnosis not present

## 2015-07-06 DIAGNOSIS — E1169 Type 2 diabetes mellitus with other specified complication: Secondary | ICD-10-CM | POA: Diagnosis not present

## 2015-07-07 LAB — COMPREHENSIVE METABOLIC PANEL
ALK PHOS: 82 IU/L (ref 39–117)
ALT: 18 IU/L (ref 0–44)
AST: 12 IU/L (ref 0–40)
Albumin/Globulin Ratio: 1.7 (ref 1.2–2.2)
Albumin: 4.3 g/dL (ref 3.5–5.5)
BUN/Creatinine Ratio: 18 (ref 9–20)
BUN: 14 mg/dL (ref 6–24)
Bilirubin Total: 0.2 mg/dL (ref 0.0–1.2)
CO2: 25 mmol/L (ref 18–29)
Calcium: 9.3 mg/dL (ref 8.7–10.2)
Chloride: 101 mmol/L (ref 96–106)
Creatinine, Ser: 0.8 mg/dL (ref 0.76–1.27)
GFR calc Af Amer: 118 mL/min/{1.73_m2} (ref >59)
GFR calc non Af Amer: 102 mL/min/{1.73_m2} (ref >59)
GLOBULIN, TOTAL: 2.6 g/dL (ref 1.5–4.5)
GLUCOSE: 122 mg/dL — AB (ref 65–99)
Potassium: 4.8 mmol/L (ref 3.5–5.2)
SODIUM: 142 mmol/L (ref 134–144)
Total Protein: 6.9 g/dL (ref 6.0–8.5)

## 2015-07-07 LAB — LIPID PANEL
Chol/HDL Ratio: 4.6 ratio units (ref 0.0–5.0)
Cholesterol, Total: 160 mg/dL (ref 100–199)
HDL: 35 mg/dL — ABNORMAL LOW (ref >39)
LDL CALC: 57 mg/dL (ref 0–99)
Triglycerides: 342 mg/dL — ABNORMAL HIGH (ref 0–149)
VLDL Cholesterol Cal: 68 mg/dL — ABNORMAL HIGH (ref 5–40)

## 2015-07-07 LAB — HEMOGLOBIN A1C
Est. average glucose Bld gHb Est-mCnc: 154 mg/dL
Hgb A1c MFr Bld: 7 % — ABNORMAL HIGH (ref 4.8–5.6)

## 2015-07-07 IMAGING — CR DG CHEST 2V
2 series · 2 of 2 positions shown · non-contrast
Comparison: September 27, 2013.

CLINICAL DATA: Shortness of breath.

EXAM:
CHEST  2 VIEW

[w chest pa]
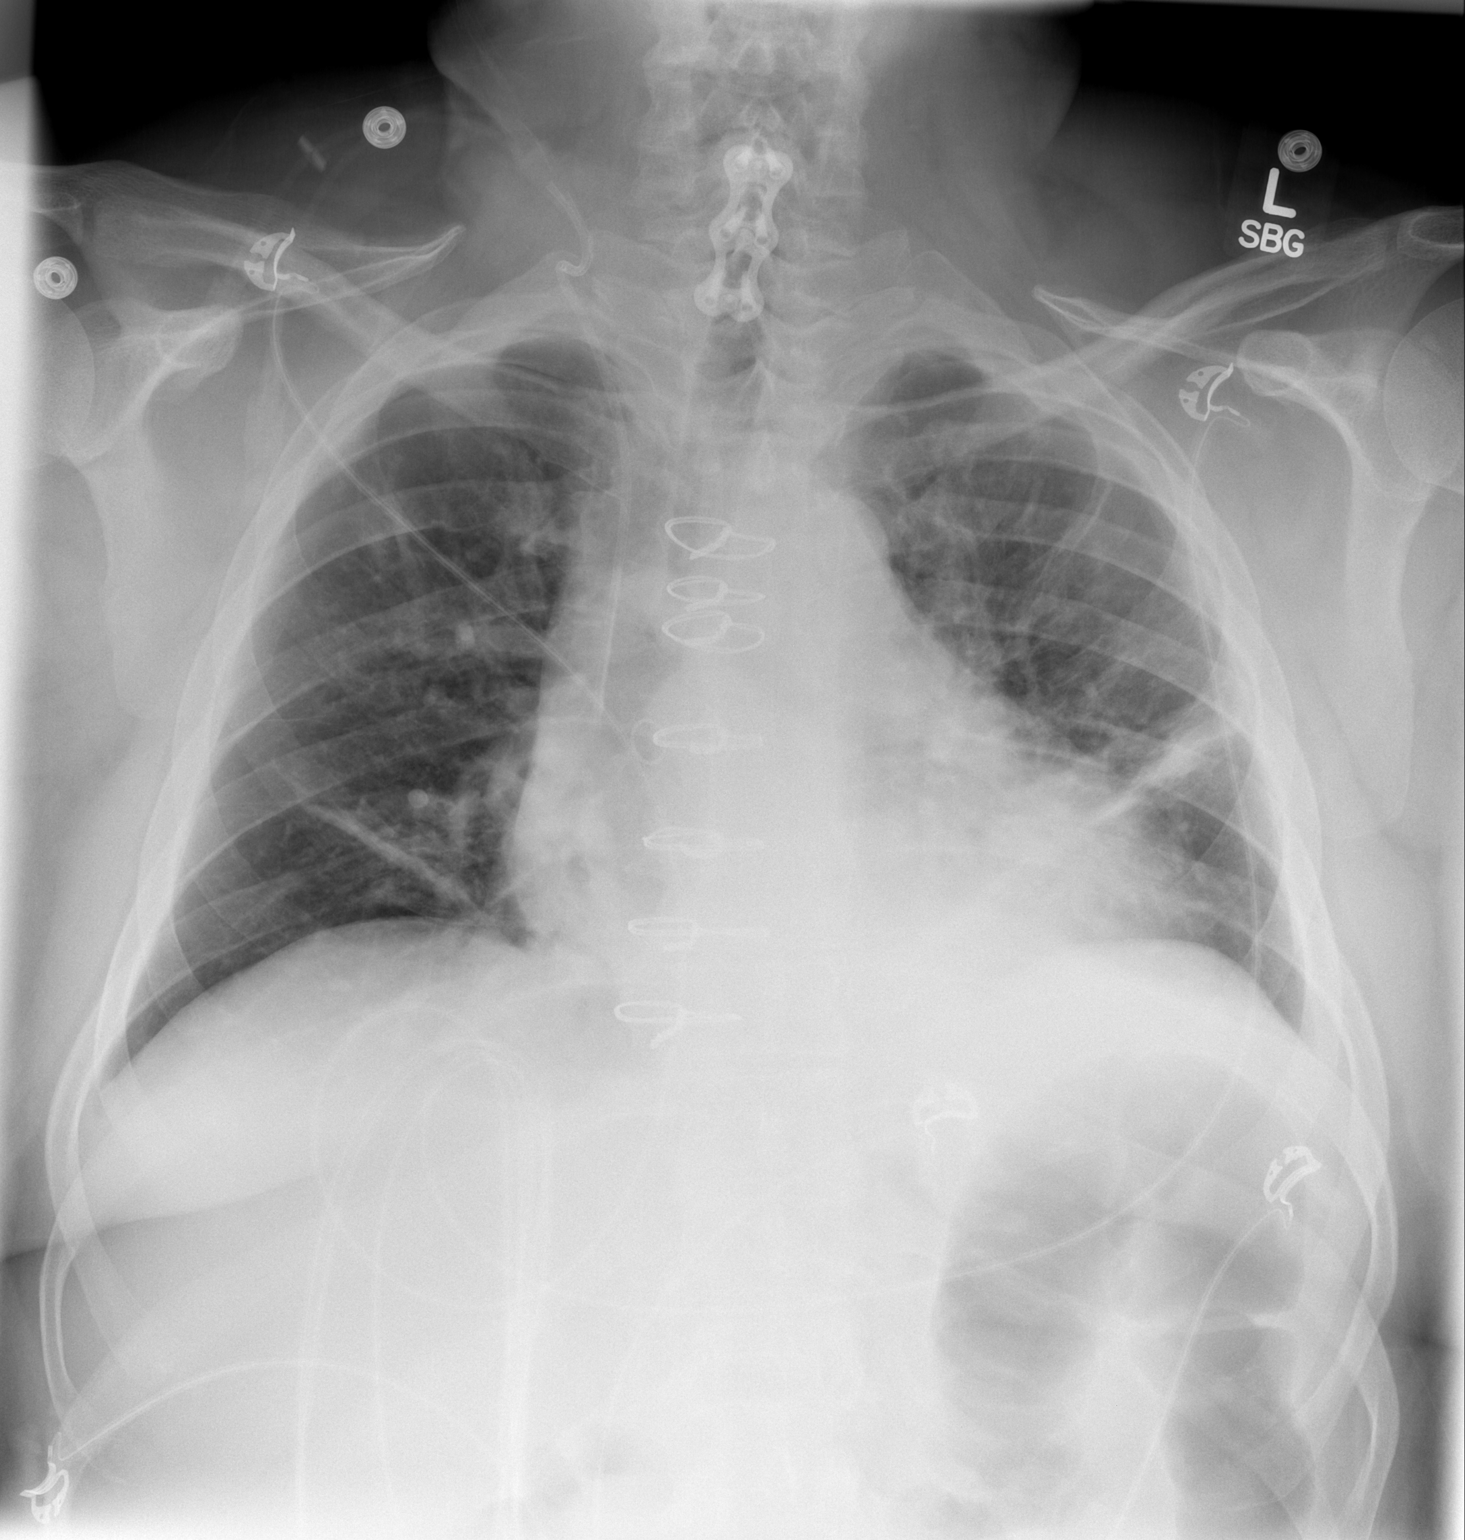

[w chest lat]
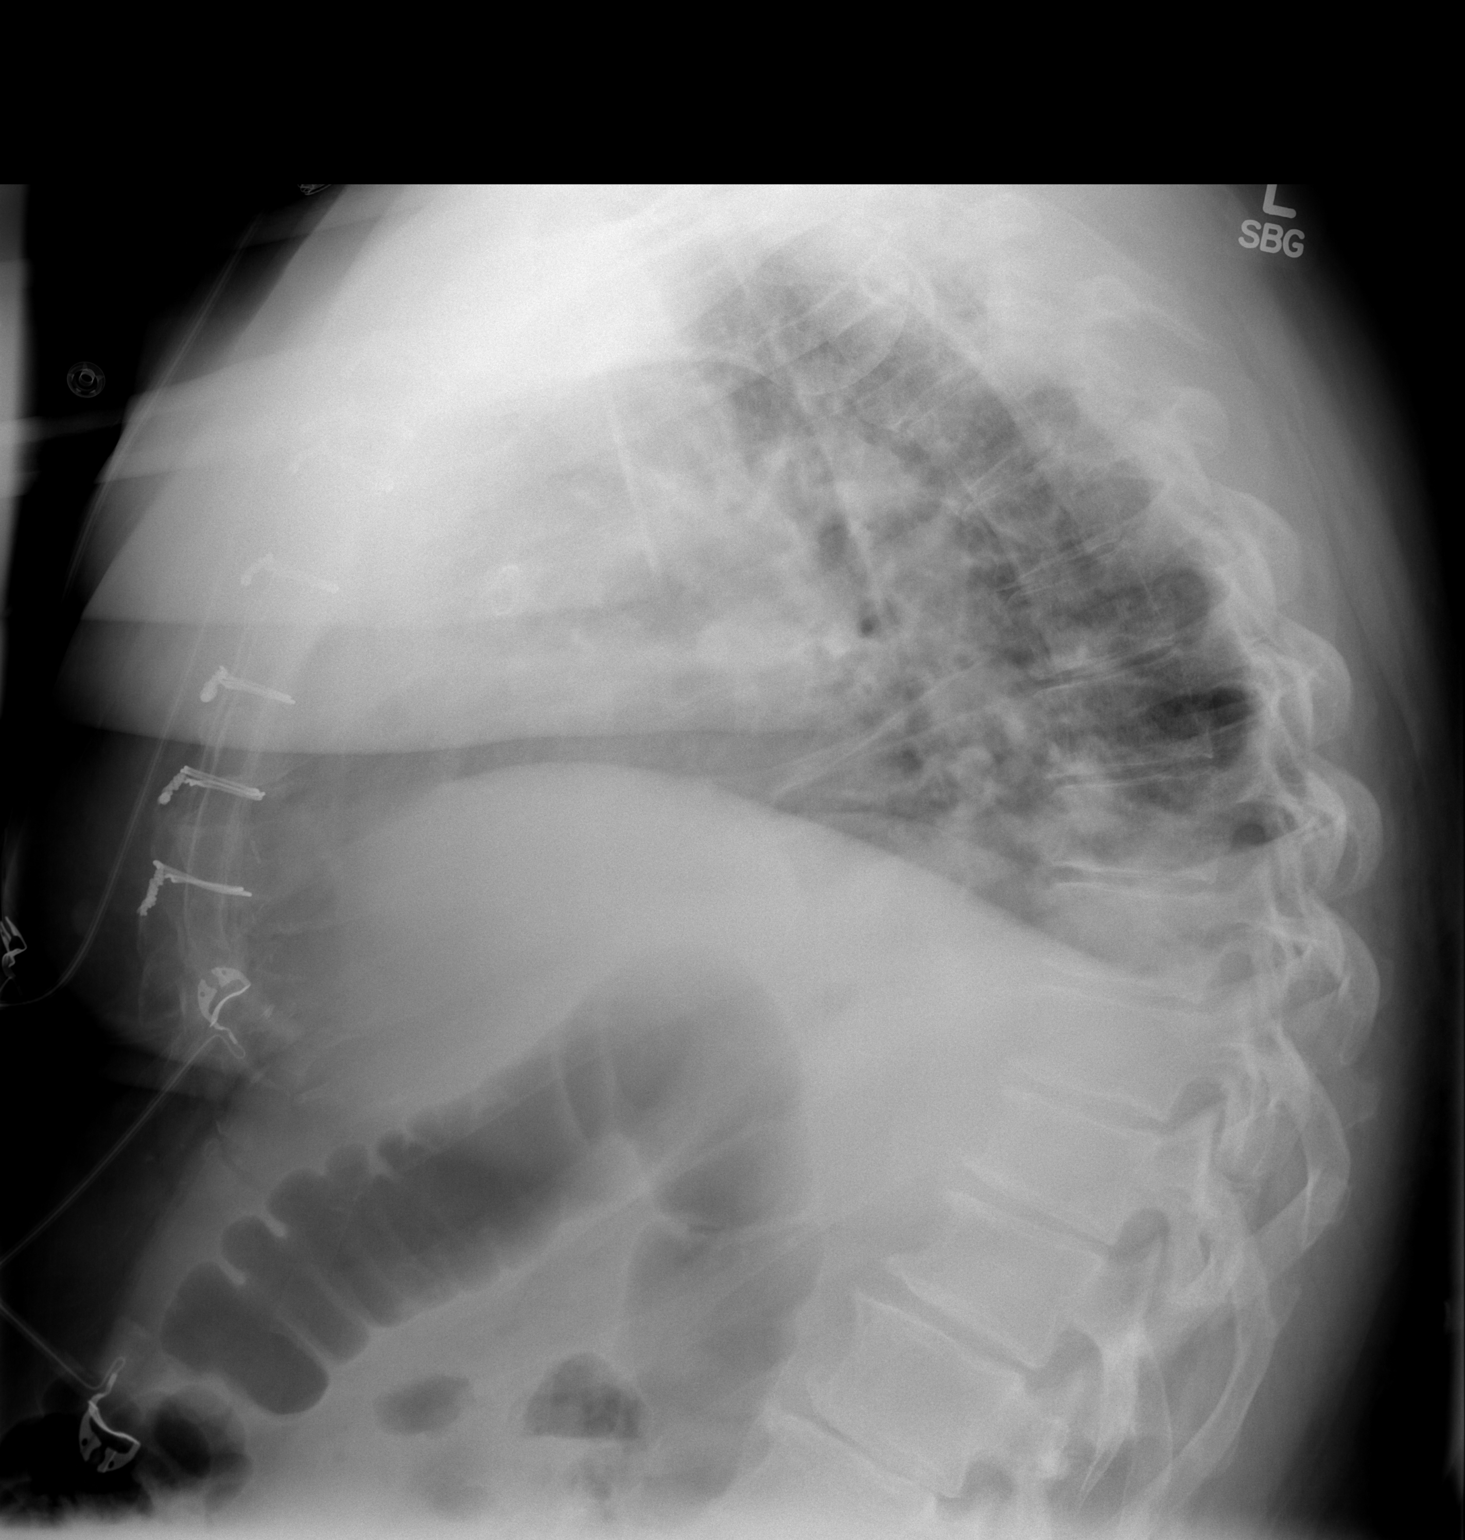

[2 of 2 positions shown; findings below may reference images not displayed]

FINDINGS: Stable cardiomediastinal silhouette. Status post coronary artery
bypass graft. Right internal jugular catheter line is again noted
with tip in expected position of the SVC. Left-sided chest tube has
been removed and no pneumothorax is noted. Stable linear opacity is
noted in left lower lobe consistent with subsegmental atelectasis.
Slightly increased linear opacity is noted in right lung base
consistent with subsegmental atelectasis.
IMPRESSION: Bilateral basilar subsegmental atelectasis is noted. No pneumothorax
is noted status post left-sided chest tube removal.

## 2015-07-10 ENCOUNTER — Telehealth: Payer: Self-pay

## 2015-07-10 NOTE — Telephone Encounter (Signed)
-----   Message from Carmon Ginsberg, [place] sent at 07/10/2015  4:30 PM EDT ----- Labs are ok. A1C is 7 % : would repeat in 3 months. If still at this level or higher would place you back on medication.

## 2015-07-10 NOTE — Telephone Encounter (Signed)
Patient has been advised. KW 

## 2015-08-28 DIAGNOSIS — I10 Essential (primary) hypertension: Secondary | ICD-10-CM | POA: Diagnosis not present

## 2015-08-28 DIAGNOSIS — Z951 Presence of aortocoronary bypass graft: Secondary | ICD-10-CM | POA: Diagnosis not present

## 2015-08-28 DIAGNOSIS — E782 Mixed hyperlipidemia: Secondary | ICD-10-CM | POA: Diagnosis not present

## 2015-08-28 DIAGNOSIS — I2510 Atherosclerotic heart disease of native coronary artery without angina pectoris: Secondary | ICD-10-CM | POA: Diagnosis not present

## 2015-09-04 ENCOUNTER — Other Ambulatory Visit: Payer: Self-pay | Admitting: Family Medicine

## 2015-09-25 ENCOUNTER — Encounter: Payer: Self-pay | Admitting: Family Medicine

## 2015-09-25 ENCOUNTER — Ambulatory Visit (INDEPENDENT_AMBULATORY_CARE_PROVIDER_SITE_OTHER): Payer: BLUE CROSS/BLUE SHIELD | Admitting: Family Medicine

## 2015-09-25 VITALS — BP 118/66 | HR 88 | Temp 98.5°F | Resp 20 | Wt 314.0 lb

## 2015-09-25 DIAGNOSIS — L720 Epidermal cyst: Secondary | ICD-10-CM | POA: Diagnosis not present

## 2015-09-25 DIAGNOSIS — L089 Local infection of the skin and subcutaneous tissue, unspecified: Secondary | ICD-10-CM

## 2015-09-25 NOTE — Progress Notes (Signed)
Subjective:     Patient ID: Nathan Hull, male   DOB: 1960/06/09, 55 y.o.   MRN: [id number]  HPI  Chief Complaint  Patient presents with  . Mass    upper back x 2 weeks  States it has become progressively more tender over the last two weeks. Prior hx of epidermoid cysts.   Review of Systems     Objective:   Physical Exam  Constitutional: He appears well-developed and well-nourished. No distress.  Skin:  Left upper back with inflamed epidermoid cyst/ Procedure note: Cleansed with betadine and infiltrated with lidocaine/epinephrine. Incised with return of pus and minimal cyst material. C & S obtained. Pressure dressing applied.       Assessment:    1. Infected epidermoid cyst - INCISION AND DRAINAGE; Future - WOUND CULTURE    Plan:    Further f/u pending wound culture. Cleanse with soap and water and apply dressing until healed.

## 2015-09-25 NOTE — Patient Instructions (Signed)
Discussed cleansing with soap and water and applying dressing daily until healed. We will call with the results of the wound culture.

## 2015-09-27 ENCOUNTER — Telehealth: Payer: Self-pay

## 2015-09-27 NOTE — Telephone Encounter (Signed)
Patient advised. Patient thinks the cyst is getting better. It's still draining bloody drainage. He has been changing the dressings on a regular basis. He reports the pain has slightly improved and the size of the cyst has decreased.

## 2015-09-27 NOTE — Telephone Encounter (Signed)
-----   Message from Carmon Ginsberg, [place] sent at 09/27/2015  9:16 AM EDT ----- No specific organism detected. Is your cyst getting better?

## 2015-09-28 LAB — WOUND CULTURE: Organism ID, Bacteria: NONE SEEN

## 2016-01-10 ENCOUNTER — Other Ambulatory Visit: Payer: Self-pay | Admitting: Family Medicine

## 2016-02-16 ENCOUNTER — Other Ambulatory Visit: Payer: Self-pay | Admitting: Family Medicine

## 2016-02-27 DIAGNOSIS — R0602 Shortness of breath: Secondary | ICD-10-CM | POA: Diagnosis not present

## 2016-02-27 DIAGNOSIS — I2510 Atherosclerotic heart disease of native coronary artery without angina pectoris: Secondary | ICD-10-CM | POA: Diagnosis not present

## 2016-02-27 DIAGNOSIS — I25810 Atherosclerosis of coronary artery bypass graft(s) without angina pectoris: Secondary | ICD-10-CM | POA: Diagnosis not present

## 2016-02-27 DIAGNOSIS — I10 Essential (primary) hypertension: Secondary | ICD-10-CM | POA: Diagnosis not present

## 2016-03-11 ENCOUNTER — Other Ambulatory Visit: Payer: Self-pay | Admitting: Family Medicine

## 2016-04-02 ENCOUNTER — Ambulatory Visit (INDEPENDENT_AMBULATORY_CARE_PROVIDER_SITE_OTHER): Payer: BLUE CROSS/BLUE SHIELD | Admitting: Family Medicine

## 2016-04-02 ENCOUNTER — Encounter: Payer: Self-pay | Admitting: Family Medicine

## 2016-04-02 VITALS — BP 138/70 | HR 58 | Temp 98.9°F | Resp 16 | Wt 323.0 lb

## 2016-04-02 DIAGNOSIS — M545 Low back pain, unspecified: Secondary | ICD-10-CM

## 2016-04-02 DIAGNOSIS — K625 Hemorrhage of anus and rectum: Secondary | ICD-10-CM

## 2016-04-02 LAB — HEMOCCULT GUIAC POC 1CARD (OFFICE): Fecal Occult Blood, POC: POSITIVE — AB

## 2016-04-02 MED ORDER — CYCLOBENZAPRINE HCL 5 MG PO TABS: 5.0000 mg | ORAL_TABLET | Freq: Three times a day (TID) | ORAL | 0 refills | Status: DC | PRN

## 2016-04-02 NOTE — Patient Instructions (Addendum)
For your back may use up to 3000 mg tylenol daily and heating pad for 20 minutes several x day. We will call you about the referral for the stomach specialist.

## 2016-04-02 NOTE — Progress Notes (Addendum)
Subjective:     Patient ID: Nathan Hull, male   DOB: [birth year], 56 y.o.   MRN: [id number]  HPI  Chief Complaint  Patient presents with  . Blood In Stools    Patient reports that he has noticed blood mixed in with his stools over the last 2 weeks. He reports that he has had back pain well. Patient reports that he has had a colonoscopy but does not remember when. Patient reports that he does have nausea, irregular BM (loose and constipation), and bloated sensation.   Reports both bright red and dark blood on toilet tissue. Low back pain started about the same time and is exacerbated by bowel movements. No history of colonoscopy but remembers ? Sigmoid for hemorrhoids. No family hx of IBD.   Review of Systems     Objective:   Physical Exam  Constitutional: He appears well-developed and well-nourished. No distress.  Abdominal: Soft. There is no tenderness.  Genitourinary: Rectal exam shows guaiac positive stool.  Genitourinary Comments: Skin tags but no external hemorrhoids or internal hemorrhoids appreciated.  Musculoskeletal:  Muscle strength in lower extremities 5/5. SLR's to 45 degrees without radiation of back pain. Localizes pain over his bilateral lumbar back though not tender to the touch.       Assessment:    1. Rectal bleeding - Ambulatory referral to Gastroenterology - POCT occult blood stool  2. Acute bilateral low back pain without sciatica - cyclobenzaprine (FLEXERIL) 5 MG tablet; Take 1 tablet (5 mg total) by mouth 3 (three) times daily as needed for muscle spasms.  Dispense: 30 tablet; Refill: 0    Plan:    Discussed use of heating pad and rational use of Tylenol.

## 2016-04-16 ENCOUNTER — Encounter: Payer: Self-pay | Admitting: Gastroenterology

## 2016-04-16 ENCOUNTER — Other Ambulatory Visit: Payer: Self-pay

## 2016-04-16 ENCOUNTER — Ambulatory Visit (INDEPENDENT_AMBULATORY_CARE_PROVIDER_SITE_OTHER): Payer: BLUE CROSS/BLUE SHIELD | Admitting: Gastroenterology

## 2016-04-16 ENCOUNTER — Ambulatory Visit: Payer: BLUE CROSS/BLUE SHIELD | Admitting: Gastroenterology

## 2016-04-16 VITALS — BP 136/71 | HR 62 | Temp 98.2°F | Ht 72.0 in | Wt 322.0 lb

## 2016-04-16 DIAGNOSIS — K921 Melena: Secondary | ICD-10-CM | POA: Diagnosis not present

## 2016-04-16 NOTE — Progress Notes (Signed)
Gastroenterology Consultation  Referring Provider:     Carmon Ginsberg, [place] Primary Care Physician:  Carmon Ginsberg, PA Primary Gastroenterologist:  Dr. Allen Norris     Reason for Consultation:     Hematochezia        HPI:   Nathan Hull is a 56 y.o. y/o male referred for consultation & management of Hematochezia by Dr. Carmon Ginsberg, [place].  This patient comes today with a report of rectal bleeding.  The patient is status post coronary bypass with chronic Plavix and aspirin use.  The patient reports that he had some bright red blood with clots.  He denies any change in bowel habits, nausea vomiting fevers or chills.  The patient also denies any unexplained weight loss.  The patient has not had a colonoscopy in the past. Prior to this the patient denies having any rectal bleeding.  Past Medical History:  Diagnosis Date  . CAD (coronary artery disease)   . Dyspnea   . Family history of anesthesia complication    mother had N/V after valve replacement  . Hyperlipidemia   . Hypertension   . Myocardial infarct   . Obesity     Past Surgical History:  Procedure Laterality Date  . APPLICATION OF WOUND VAC Right 10/22/2013   Procedure: APPLICATION OF WOUND VAC;  Surgeon: [name], MD;  Location: [place];  Service: Vascular;  Laterality: Right;  . C-Spine disc replacement x 2     06/16/2012  . CARDIAC CATHETERIZATION     last week @ [facility]  . cardiac stents     has been cathed x 4   Had  MI  in 2007  . CARPAL TUNNEL RELEASE     on right  . CORONARY ARTERY BYPASS GRAFT N/A 09/24/2013   Procedure: CORONARY ARTERY BYPASS GRAFTING (CABG) x three, using LIMA to LAD, and right leg greater saphenous vein harvested endoscopically - SVG to Ramus, SVG to PL;  Surgeon: [name], MD;  Location: [place];  Service: Open Heart Surgery;  Laterality: N/A;  . I&D EXTREMITY Right 10/22/2013   Procedure: IRRIGATION AND DEBRIDEMENT EXTREMITY-RIGHT LEG;  Surgeon: [name], MD;  Location: [place];   Service: Vascular;  Laterality: Right;  . INTRAOPERATIVE TRANSESOPHAGEAL ECHOCARDIOGRAM N/A 09/24/2013   Procedure: INTRAOPERATIVE TRANSESOPHAGEAL ECHOCARDIOGRAM;  Surgeon: [name], MD;  Location: [place];  Service: Open Heart Surgery;  Laterality: N/A;  . PTCA at [facility]     04/28/2005  . SPINAL FUSION     vertebra, congentital : Dr. Annette Stable   . TONSILLECTOMY      Prior to Admission medications   Medication Sig Start Date End Date Taking? Authorizing Provider  aspirin EC 325 MG EC tablet Take 1 tablet (325 mg total) by mouth daily. 10/26/13  Yes Wayne E Gold, PA-C  atorvastatin (LIPITOR) 80 MG tablet 1 (ONE) TABLET TABLET, ORAL, DAILY 03/11/16  Yes Carmon Ginsberg, PA  clopidogrel (PLAVIX) 75 MG tablet TAKE 1 TABLET BY MOUTH DAILY 01/10/16  Yes Birdie Sons, MD  lisinopril (PRINIVIL,ZESTRIL) 10 MG tablet TAKE 1 TABLET BY MOUTH EVERY DAY 09/04/15  Yes Carmon Ginsberg, PA  metoprolol (LOPRESSOR) 50 MG tablet 1 TABLET, ORAL, TWO TIMES DAILY 02/16/16  Yes Carmon Ginsberg, PA  cyclobenzaprine (FLEXERIL) 5 MG tablet Take 1 tablet (5 mg total) by mouth 3 (three) times daily as needed for muscle spasms. Patient not taking: Reported on 04/16/2016 04/02/16   Carmon Ginsberg, PA  fluticasone [facility]) 50 MCG/ACT nasal  spray Place 2 sprays into both nostrils daily. Patient not taking: Reported on 04/16/2016 01/24/15   Birdie Sons, MD    Family History  Problem Relation Age of Onset  . Hypertension Mother   . Rheumatic fever Mother     S/P MVR  . Hypertension Father      Social History  Substance Use Topics  . Smoking status: Former Smoker    Packs/day: 2.00    Types: Cigarettes    Quit date: 04/23/2005  . Smokeless tobacco: Never Used  . Alcohol use 3.6 oz/week    6 Cans of beer per week     Comment: half a 6 pk  a week    Allergies as of 04/16/2016  . (No Known Allergies)    Review of Systems:    All systems reviewed and negative except where noted in HPI.   Physical Exam:  BP  136/71   Pulse 62   Temp 98.2 F (36.8 C) (Oral)   Ht 6' (1.829 m)   Wt (!) 322 lb (146.1 kg)   BMI 43.67 kg/m  No LMP for male patient. Psych:  Alert and cooperative. Normal mood and affect. General:   Alert,  Well-developed, well-nourished, pleasant and cooperative in NAD Head:  Normocephalic and atraumatic. Eyes:  Sclera clear, no icterus.   Conjunctiva pink. Ears:  Normal auditory acuity. Nose:  No deformity, discharge, or lesions. Mouth:  No deformity or lesions,oropharynx pink & moist. Neck:  Supple; no masses or thyromegaly. Lungs:  Respirations even and unlabored.  Clear throughout to auscultation.   No wheezes, crackles, or rhonchi. No acute distress. Heart:  Regular rate and rhythm; no murmurs, clicks, rubs, or gallops. Abdomen:  Normal bowel sounds.  No bruits.  Soft, non-tender and non-distended without masses, hepatosplenomegaly or hernias noted.  No guarding or rebound tenderness.  Negative Carnett sign.   Rectal:  Deferred.  Msk:  Symmetrical without gross deformities.  Good, equal movement & strength bilaterally. Pulses:  Normal pulses noted. Extremities:  No clubbing or edema.  No cyanosis. Neurologic:  Alert and oriented x3;  grossly normal neurologically. Skin:  Intact without significant lesions or rashes.  No jaundice. Lymph Nodes:  No significant cervical adenopathy. Psych:  Alert and cooperative. Normal mood and affect.  Imaging Studies: No results found.  Assessment and Plan:   [name] is a 56 y.o. y/o male who comes in today with a history of rectal bleeding.  The patient has not had a colonoscopy in the past.  The patient will have his cardiologists contacted to make sure he can stop his Plavix.  The patient has been told not to stop his aspirin under any circumstances.  If he is able to stop his Plavix then we will proceed with a colonoscopy and any intervention that is needed.  If he is not able to stop his Plavix have told him that we can still  proceed with the colonoscopy but any intervention would have to be done at a later time off the Plavix.  The patient has been explained the plan and agrees with it.    [name], MD. Marval Regal   Note: This dictation was prepared with Dragon dictation along with smaller phrase technology. Any transcriptional errors that result from this process are unintentional.

## 2016-04-17 ENCOUNTER — Other Ambulatory Visit: Payer: Self-pay

## 2016-04-19 ENCOUNTER — Telehealth: Payer: Self-pay

## 2016-04-19 ENCOUNTER — Encounter: Payer: Self-pay | Admitting: *Deleted

## 2016-04-19 NOTE — Telephone Encounter (Signed)
Pt notified per Dr. Neoma Laming, pt is to stop Plavix 75mg  6 days prior to colonoscopy and restart 1 day after. Pt verbalized understanding of these instructions.

## 2016-04-25 NOTE — Discharge Instructions (Signed)

## 2016-04-26 ENCOUNTER — Ambulatory Visit: Payer: BLUE CROSS/BLUE SHIELD | Admitting: Anesthesiology

## 2016-04-26 ENCOUNTER — Encounter
Admission: RE | Disposition: A | Discharge status: Home / Self Care | Payer: Self-pay | Source: Ambulatory Visit | Attending: Gastroenterology

## 2016-04-26 ENCOUNTER — Ambulatory Visit
Admission: RE | Admit: 2016-04-26 | Discharge: 2016-04-26 | Disposition: A | Discharge status: Home / Self Care | Payer: BLUE CROSS/BLUE SHIELD | Source: Ambulatory Visit | Attending: Gastroenterology | Admitting: Gastroenterology

## 2016-04-26 DIAGNOSIS — K921 Melena: Secondary | ICD-10-CM | POA: Diagnosis not present

## 2016-04-26 DIAGNOSIS — I252 Old myocardial infarction: Secondary | ICD-10-CM | POA: Diagnosis not present

## 2016-04-26 DIAGNOSIS — I10 Essential (primary) hypertension: Secondary | ICD-10-CM | POA: Insufficient documentation

## 2016-04-26 DIAGNOSIS — E785 Hyperlipidemia, unspecified: Secondary | ICD-10-CM | POA: Insufficient documentation

## 2016-04-26 DIAGNOSIS — K621 Rectal polyp: Secondary | ICD-10-CM | POA: Diagnosis not present

## 2016-04-26 DIAGNOSIS — D120 Benign neoplasm of cecum: Secondary | ICD-10-CM

## 2016-04-26 DIAGNOSIS — M1990 Unspecified osteoarthritis, unspecified site: Secondary | ICD-10-CM | POA: Insufficient documentation

## 2016-04-26 DIAGNOSIS — E669 Obesity, unspecified: Secondary | ICD-10-CM | POA: Diagnosis not present

## 2016-04-26 DIAGNOSIS — I2510 Atherosclerotic heart disease of native coronary artery without angina pectoris: Secondary | ICD-10-CM | POA: Diagnosis not present

## 2016-04-26 DIAGNOSIS — K641 Second degree hemorrhoids: Secondary | ICD-10-CM | POA: Diagnosis not present

## 2016-04-26 DIAGNOSIS — Z7982 Long term (current) use of aspirin: Secondary | ICD-10-CM | POA: Diagnosis not present

## 2016-04-26 DIAGNOSIS — Z87891 Personal history of nicotine dependence: Secondary | ICD-10-CM | POA: Diagnosis not present

## 2016-04-26 DIAGNOSIS — Z955 Presence of coronary angioplasty implant and graft: Secondary | ICD-10-CM | POA: Diagnosis not present

## 2016-04-26 DIAGNOSIS — K6389 Other specified diseases of intestine: Secondary | ICD-10-CM | POA: Diagnosis not present

## 2016-04-26 DIAGNOSIS — Z79899 Other long term (current) drug therapy: Secondary | ICD-10-CM | POA: Diagnosis not present

## 2016-04-26 HISTORY — DX: Unspecified osteoarthritis, unspecified site: M19.90

## 2016-04-26 HISTORY — PX: COLONOSCOPY WITH PROPOFOL: SHX5780

## 2016-04-26 HISTORY — DX: Failed or difficult intubation, initial encounter: T88.4XXA

## 2016-04-26 HISTORY — DX: Presence of spectacles and contact lenses: Z97.3

## 2016-04-26 HISTORY — PX: POLYPECTOMY: SHX5525

## 2016-04-26 SURGERY — COLONOSCOPY WITH PROPOFOL
Anesthesia: Monitor Anesthesia Care | Wound class: Contaminated

## 2016-04-26 MED ORDER — PROPOFOL 10 MG/ML IV BOLUS
INTRAVENOUS | Status: DC | PRN
  Administered 2016-04-26: 150 mg via INTRAVENOUS
  Administered 2016-04-26: 50 mg via INTRAVENOUS
  Administered 2016-04-26: 20 mg via INTRAVENOUS
  Administered 2016-04-26 (×4): 50 mg via INTRAVENOUS

## 2016-04-26 MED ORDER — LIDOCAINE HCL (CARDIAC) 20 MG/ML IV SOLN
INTRAVENOUS | Status: DC | PRN
Start: 2016-04-26 — End: 2016-04-26
  Administered 2016-04-26: 50 mg via INTRAVENOUS

## 2016-04-26 MED ORDER — LACTATED RINGERS IV SOLN
INTRAVENOUS | Status: DC
  Administered 2016-04-26: 09:00:00 via INTRAVENOUS

## 2016-04-26 MED ORDER — STERILE WATER FOR IRRIGATION IR SOLN
Status: DC | PRN
  Administered 2016-04-26: 11:00:00

## 2016-04-26 SURGICAL SUPPLY — 23 items
CANISTER SUCT 1200ML W/VALVE (MISCELLANEOUS) ×3 IMPLANT
CLIP HMST 235XBRD CATH ROT (MISCELLANEOUS) ×1 IMPLANT
CLIP RESOLUTION 360 11X235 (MISCELLANEOUS) ×3
FCP ESCP3.2XJMB 240X2.8X (MISCELLANEOUS)
FORCEPS BIOP RAD 4 LRG CAP 4 (CUTTING FORCEPS) ×2 IMPLANT
FORCEPS BIOP RJ4 240 W/NDL (MISCELLANEOUS)
FORCEPS ESCP3.2XJMB 240X2.8X (MISCELLANEOUS) IMPLANT
GOWN CVR UNV OPN BCK APRN NK (MISCELLANEOUS) ×4 IMPLANT
GOWN ISOL THUMB LOOP REG UNIV (MISCELLANEOUS) ×6
INJECTOR VARIJECT VIN23 (MISCELLANEOUS) IMPLANT
KIT DEFENDO VALVE AND CONN (KITS) IMPLANT
KIT ENDO PROCEDURE OLY (KITS) ×3 IMPLANT
MARKER SPOT ENDO TATTOO 5ML (MISCELLANEOUS) IMPLANT
PAD GROUND ADULT SPLIT (MISCELLANEOUS) IMPLANT
PROBE APC STR FIRE (PROBE) IMPLANT
RETRIEVER NET ROTH 2.5X230 LF (MISCELLANEOUS) IMPLANT
SNARE SHORT THROW 13M SML OVAL (MISCELLANEOUS) ×2 IMPLANT
SNARE SHORT THROW 30M LRG OVAL (MISCELLANEOUS) IMPLANT
SNARE SNG USE RND 15MM (INSTRUMENTS) IMPLANT
SPOT EX ENDOSCOPIC TATTOO (MISCELLANEOUS)
TRAP ETRAP POLY (MISCELLANEOUS) ×1 IMPLANT
VARIJECT INJECTOR VIN23 (MISCELLANEOUS)
WATER STERILE IRR 250ML POUR (IV SOLUTION) ×3 IMPLANT

## 2016-04-26 NOTE — Anesthesia Preprocedure Evaluation (Signed)
Anesthesia Evaluation  Patient identified by MRN, date of birth, ID band Patient awake    Reviewed: Allergy & Precautions, H&P , NPO status , Patient's Chart, lab work & pertinent test results, reviewed documented beta blocker date and time   History of Anesthesia Complications (+) DIFFICULT AIRWAY and history of anesthetic complications  Airway Mallampati: II  TM Distance: >3 FB Neck ROM: full    Dental no notable dental hx.    Pulmonary former smoker,    Pulmonary exam normal breath sounds clear to auscultation       Cardiovascular Exercise Tolerance: Good hypertension, + CAD, + Past MI and + CABG   Rhythm:regular Rate:Normal     Neuro/Psych negative neurological ROS  negative psych ROS   GI/Hepatic negative GI ROS, Neg liver ROS,   Endo/Other  negative endocrine ROS  Renal/GU negative Renal ROS  negative genitourinary   Musculoskeletal  (+) Arthritis ,   Abdominal   Peds  Hematology negative hematology ROS (+)   Anesthesia Other Findings   Reproductive/Obstetrics negative OB ROS                             Anesthesia Physical Anesthesia Plan  ASA: III  Anesthesia Plan: MAC   Post-op Pain Management:    Induction:   Airway Management Planned:   Additional Equipment:   Intra-op Plan:   Post-operative Plan:   Informed Consent: I have reviewed the patients History and Physical, chart, labs and discussed the procedure including the risks, benefits and alternatives for the proposed anesthesia with the patient or authorized representative who has indicated his/her understanding and acceptance.   Dental Advisory Given  Plan Discussed with: CRNA  Anesthesia Plan Comments:         Anesthesia Quick Evaluation

## 2016-04-26 NOTE — Anesthesia Postprocedure Evaluation (Signed)
Anesthesia Post Note  Patient: Nathan Hull  Procedure(s) Performed: Procedure(s) (LRB): COLONOSCOPY WITH PROPOFOL (N/A) POLYPECTOMY  Patient location during evaluation: PACU Anesthesia Type: MAC Level of consciousness: awake and alert Pain management: pain level controlled Vital Signs Assessment: post-procedure vital signs reviewed and stable Respiratory status: spontaneous breathing, nonlabored ventilation, respiratory function stable and patient connected to nasal cannula oxygen Cardiovascular status: stable and blood pressure returned to baseline Anesthetic complications: no    Alisa Graff

## 2016-04-26 NOTE — Transfer of Care (Signed)
Immediate Anesthesia Transfer of Care Note  Patient: Nathan Hull  Procedure(s) Performed: Procedure(s): COLONOSCOPY WITH PROPOFOL (N/A) POLYPECTOMY  Patient Location: PACU  Anesthesia Type: MAC  Level of Consciousness: awake, alert  and patient cooperative  Airway and Oxygen Therapy: Patient Spontanous Breathing and Patient connected to supplemental oxygen  Post-op Assessment: Post-op Vital signs reviewed, Patient's Cardiovascular Status Stable, Respiratory Function Stable, Patent Airway and No signs of Nausea or vomiting  Post-op Vital Signs: Reviewed and stable  Complications: No apparent anesthesia complications

## 2016-04-26 NOTE — Anesthesia Procedure Notes (Signed)
Procedure Name: MAC Date/Time: 04/26/2016 10:39 AM Performed by: Cameron Ali Pre-anesthesia Checklist: Patient identified, Emergency Drugs available, Suction available, Timeout performed and Patient being monitored Patient Re-evaluated:Patient Re-evaluated prior to inductionOxygen Delivery Method: Nasal cannula Placement Confirmation: positive ETCO2

## 2016-04-26 NOTE — H&P (Signed)
Nathan Lame, MD [facility] [address], [place], [place] [address] Phone: [phone] Fax : [phone]  Primary Care Physician:  Nathan Hull, [place] Primary Gastroenterologist:  Dr. Allen Hull  Pre-Procedure History & Physical: HPI:  Nathan Hull is a 57 y.o. male is here for an colonoscopy.   Past Medical History:  Diagnosis Date  . Arthritis   . CAD (coronary artery disease)   . Difficult intubation 09/24/2013   reports told difficult after CABG at [facility].  Glidescope used  . Dyspnea   . Family history of anesthesia complication    mother had N/V after valve replacement  . Hyperlipidemia   . Hypertension   . Myocardial infarct 2007  . Obesity   . Wears contact lenses     Past Surgical History:  Procedure Laterality Date  . APPLICATION OF WOUND VAC Right 10/22/2013   Procedure: APPLICATION OF WOUND VAC;  Surgeon: Nathan Isaac, MD;  Location: [place];  Service: Vascular;  Laterality: Right;  . C-Spine disc replacement x 2     06/16/2012  . CARDIAC CATHETERIZATION     last week @ [facility]  . cardiac stents     has been cathed x 4   Had  MI  in 2007  . CARPAL TUNNEL RELEASE     on right  . CORONARY ARTERY BYPASS GRAFT N/A 09/24/2013   Procedure: CORONARY ARTERY BYPASS GRAFTING (CABG) x three, using LIMA to LAD, and right leg greater saphenous vein harvested endoscopically - SVG to Ramus, SVG to PL;  Surgeon: Nathan Poot, MD;  Location: [place];  Service: Open Heart Surgery;  Laterality: N/A;  . I&D EXTREMITY Right 10/22/2013   Procedure: IRRIGATION AND DEBRIDEMENT EXTREMITY-RIGHT LEG;  Surgeon: Nathan Isaac, MD;  Location: [place];  Service: Vascular;  Laterality: Right;  . INTRAOPERATIVE TRANSESOPHAGEAL ECHOCARDIOGRAM N/A 09/24/2013   Procedure: INTRAOPERATIVE TRANSESOPHAGEAL ECHOCARDIOGRAM;  Surgeon: Nathan Poot, MD;  Location: [place];  Service: Open Heart Surgery;  Laterality: N/A;  . PTCA at [facility]     04/28/2005  . SPINAL FUSION     vertebra, congentital : Dr.  Annette Hull   . TONSILLECTOMY      Prior to Admission medications   Medication Sig Start Date End Date Taking? Authorizing Provider  aspirin EC 325 MG EC tablet Take 1 tablet (325 mg total) by mouth daily. 10/26/13  Yes Nathan E Gold, PA-C  atorvastatin (LIPITOR) 80 MG tablet 1 (ONE) TABLET TABLET, ORAL, DAILY 03/11/16  Yes Nathan Ginsberg, PA  clopidogrel (PLAVIX) 75 MG tablet TAKE 1 TABLET BY MOUTH DAILY 01/10/16  Yes Nathan Sons, MD  cyclobenzaprine (FLEXERIL) 5 MG tablet Take 1 tablet (5 mg total) by mouth 3 (three) times daily as needed for muscle spasms. 04/02/16  Yes Nathan Ginsberg, PA  fluticasone (FLONASE) 50 MCG/ACT nasal spray Place 2 sprays into both nostrils daily. 01/24/15  Yes Nathan Sons, MD  lisinopril (PRINIVIL,ZESTRIL) 10 MG tablet TAKE 1 TABLET BY MOUTH EVERY DAY 09/04/15  Yes Nathan Ginsberg, PA  metoprolol (LOPRESSOR) 50 MG tablet 1 TABLET, ORAL, TWO TIMES DAILY 02/16/16  Yes Nathan Ginsberg, PA    Allergies as of 04/17/2016  . (No Known Allergies)    Family History  Problem Relation Age of Onset  . Hypertension Mother   . Rheumatic fever Mother     S/P MVR  . Hypertension Father     Social History   Social History  . Marital status: Divorced    Spouse name:  N/A  . Number of children: N/A  . Years of education: N/A   Occupational History  . Not on file.   Social History Main Topics  . Smoking status: Former Smoker    Packs/day: 2.00    Types: Cigarettes    Quit date: 04/23/2005  . Smokeless tobacco: Never Used  . Alcohol use 3.6 oz/week    6 Cans of beer per week     Comment: half a 6 pk  a week  . Drug use: No  . Sexual activity: Not on file   Other Topics Concern  . Not on file   Social History Narrative  . No narrative on file    Review of Systems: See HPI, otherwise negative ROS  Physical Exam: BP 137/79   Pulse 71   Temp 97.1 F (36.2 C) (Temporal)   Resp 16   Ht 6' (1.829 m)   Wt (!) 304 lb (137.9 kg)   SpO2 95%   BMI 41.23 kg/m   General:   Alert,  pleasant and cooperative in NAD Head:  Normocephalic and atraumatic. Neck:  Supple; no masses or thyromegaly. Lungs:  Clear throughout to auscultation.    Heart:  Regular rate and rhythm. Abdomen:  Soft, nontender and nondistended. Normal bowel sounds, without guarding, and without rebound.   Neurologic:  Alert and  oriented x4;  grossly normal neurologically.  Impression/Plan: Nathan Hull is here for an colonoscopy to be performed for hematochezia  Risks, benefits, limitations, and alternatives regarding  colonoscopy have been reviewed with the patient.  Questions have been answered.  All parties agreeable.   Nathan Lame, MD  04/26/2016, 9:31 AM

## 2016-04-26 NOTE — Op Note (Signed)
[facility] Patient Name: Nathan Hull Procedure Date: 04/26/2016 10:32 AM MRN: [id number] Account #: [id number] Date of Birth: Aug 23, 1960 Admit Type: Outpatient Age: 56 Room: [facility] OR ROOM 01 Gender: Male Note Status: Finalized Procedure:            Colonoscopy Indications:          Hematochezia Providers:            [name] MD, MD Referring MD:         Rose Fillers. Jaynie Crumble, MD (Referring MD) Medicines:            Propofol per Anesthesia Complications:        No immediate complications. Procedure:            Pre-Anesthesia Assessment:                       - Prior to the procedure, a History and Physical was                        performed, and patient medications and allergies were                        reviewed. The patient's tolerance of previous                        anesthesia was also reviewed. The risks and benefits of                        the procedure and the sedation options and risks were                        discussed with the patient. All questions were                        answered, and informed consent was obtained. Prior                        Anticoagulants: The patient has taken no previous                        anticoagulant or antiplatelet agents. ASA Grade                        Assessment: II - A patient with mild systemic disease.                        After reviewing the risks and benefits, the patient was                        deemed in satisfactory condition to undergo the                        procedure.                       After obtaining informed consent, the colonoscope was                        passed under direct vision. Throughout the procedure,  the patient's blood pressure, pulse, and oxygen                        saturations were monitored continuously. The Olympus                        PCF H180AL Colonoscope (S#: [id number]) was introduced                        through the  anus and advanced to the the cecum,                        identified by appendiceal orifice and ileocecal valve.                        The colonoscopy was performed without difficulty. The                        patient tolerated the procedure well. The quality of                        the bowel preparation was excellent. Findings:      The perianal and digital rectal examinations were normal.      A 2 mm polyp was found in the cecum. The polyp was sessile. The polyp       was removed with a cold biopsy forceps. Resection and retrieval were       complete.      A 7 mm polyp was found in the rectum. The polyp was pedunculated. The       polyp was removed with a cold snare. Resection and retrieval were       complete. To prevent bleeding post-intervention, one hemostatic clip was       successfully placed (MR conditional). There was no bleeding at the end       of the procedure.      Non-bleeding internal hemorrhoids were found during retroflexion. The       hemorrhoids were Grade II (internal hemorrhoids that prolapse but reduce       spontaneously). Impression:           - One 2 mm polyp in the cecum, removed with a cold                        biopsy forceps. Resected and retrieved.                       - One 7 mm polyp in the rectum, removed with a cold                        snare. Resected and retrieved. Clip (MR conditional)                        was placed.                       - Non-bleeding internal hemorrhoids. Recommendation:       - Discharge patient to home.                       - Resume previous diet.                       -  Continue present medications.                       - Await pathology results.                       - Repeat colonoscopy in 5 years if polyp adenoma and 10                        years if hyperplastic Procedure Code(s):    --- Professional ---                       [phone], Colonoscopy, flexible; with removal of tumor(s),                         polyp(s), or other lesion(s) by snare technique                       45380, 83, Colonoscopy, flexible; with biopsy, single                        or multiple Diagnosis Code(s):    --- Professional ---                       K92.1, Melena (includes Hematochezia)                       D12.0, Benign neoplasm of cecum                       K62.1, Rectal polyp CPT copyright 2016 American Medical Association. All rights reserved. The codes documented in this report are preliminary and upon coder review may  be revised to meet current compliance requirements. [name] MD, MD 04/26/2016 11:01:07 AM This report has been signed electronically. Number of Addenda: 0 Note Initiated On: 04/26/2016 10:32 AM Scope Withdrawal Time: 0 hours 9 minutes 50 seconds  Total Procedure Duration: 0 hours 15 minutes 12 seconds       [facility]

## 2016-04-29 ENCOUNTER — Encounter: Payer: Self-pay | Admitting: Gastroenterology

## 2016-07-16 ENCOUNTER — Other Ambulatory Visit: Payer: Self-pay

## 2016-07-22 ENCOUNTER — Other Ambulatory Visit: Payer: Self-pay

## 2016-07-29 DIAGNOSIS — I10 Essential (primary) hypertension: Secondary | ICD-10-CM | POA: Diagnosis not present

## 2016-07-29 DIAGNOSIS — I2510 Atherosclerotic heart disease of native coronary artery without angina pectoris: Secondary | ICD-10-CM | POA: Diagnosis not present

## 2016-07-29 DIAGNOSIS — Z951 Presence of aortocoronary bypass graft: Secondary | ICD-10-CM | POA: Diagnosis not present

## 2016-07-29 DIAGNOSIS — E782 Mixed hyperlipidemia: Secondary | ICD-10-CM | POA: Diagnosis not present

## 2016-08-01 ENCOUNTER — Encounter: Payer: Self-pay | Admitting: Family Medicine

## 2016-08-01 ENCOUNTER — Ambulatory Visit (INDEPENDENT_AMBULATORY_CARE_PROVIDER_SITE_OTHER): Payer: BLUE CROSS/BLUE SHIELD | Admitting: Family Medicine

## 2016-08-01 ENCOUNTER — Other Ambulatory Visit: Payer: Self-pay | Admitting: Family Medicine

## 2016-08-01 ENCOUNTER — Telehealth: Payer: Self-pay | Admitting: Family Medicine

## 2016-08-01 VITALS — BP 120/80 | HR 68 | Temp 98.5°F | Resp 16 | Wt 314.6 lb

## 2016-08-01 DIAGNOSIS — E119 Type 2 diabetes mellitus without complications: Secondary | ICD-10-CM

## 2016-08-01 LAB — POCT GLYCOSYLATED HEMOGLOBIN (HGB A1C)

## 2016-08-01 MED ORDER — METFORMIN HCL 500 MG PO TABS: 500.0000 mg | ORAL_TABLET | Freq: Two times a day (BID) | ORAL | 0 refills | Status: DC

## 2016-08-01 MED ORDER — BLOOD GLUCOSE MONITOR KIT: PACK | 0 refills | Status: DC

## 2016-08-01 NOTE — Patient Instructions (Signed)
Please return to the gym as you were doing or think of walking at least 30 minutes daily.

## 2016-08-01 NOTE — Progress Notes (Signed)
Subjective:     Patient ID: ABDOU STOCKS, male   DOB: 1961/01/31, 56 y.o.   MRN: [id number]  HPI  Chief Complaint  Patient presents with  . Abnormal Lab    Patient comes in office today to discuss abnormal lab report that was drawn on 07/29/16 by [place]. Patient had a glucose of 182 and triglyceride level of 352. Patient reports blood sugar readings at home fasting of 170-190, patient states that a year ago he was told he was pre-diabetic and was working on lifestyle changes.   Has not been on medication in the past. States he was going to the gym 5 x week but got out of the habit. Reports he check his feet regularly. Continues to work full time at [employer].   Review of Systems     Objective:   Physical Exam  Constitutional: He appears well-developed and well-nourished. No distress.       Assessment:    1. Diabetes mellitus without complication (HCC) - metFORMIN (GLUCOPHAGE) 500 MG tablet; Take 1 tablet (500 mg total) by mouth 2 (two) times daily with a meal.  Dispense: 180 tablet; Refill: 0    Plan:   Resume exercise program.

## 2016-08-01 NOTE — Telephone Encounter (Signed)
Patient needs rx sent in for One touch Mini glucometer, one touch delicate lancets and one touch strips.

## 2016-08-01 NOTE — Telephone Encounter (Signed)
Will send in request for glucometer and supplies.

## 2016-08-01 NOTE — Addendum Note (Signed)
Addended by: Minette Headland on: 08/01/2016 03:21 PM   Modules accepted: Orders

## 2016-08-01 NOTE — Progress Notes (Signed)
Prescription has been called in.KW 

## 2016-08-07 ENCOUNTER — Encounter: Payer: Self-pay | Admitting: Family Medicine

## 2016-08-15 ENCOUNTER — Ambulatory Visit (INDEPENDENT_AMBULATORY_CARE_PROVIDER_SITE_OTHER): Payer: BLUE CROSS/BLUE SHIELD | Admitting: Family Medicine

## 2016-08-15 ENCOUNTER — Encounter: Payer: Self-pay | Admitting: Family Medicine

## 2016-08-15 VITALS — BP 98/64 | HR 60 | Temp 98.0°F | Resp 17 | Wt 304.0 lb

## 2016-08-15 DIAGNOSIS — R1012 Left upper quadrant pain: Secondary | ICD-10-CM | POA: Diagnosis not present

## 2016-08-15 MED ORDER — HYDROCODONE-ACETAMINOPHEN 5-325 MG PO TABS: ORAL_TABLET | ORAL | 0 refills | Status: DC

## 2016-08-15 NOTE — Progress Notes (Signed)
Subjective:     Patient ID: Nathan Hull, male   DOB: 03-21-1961, 56 y.o.   MRN: [id number]  HPI  Chief Complaint  Patient presents with  . Abdominal Pain    Patient comes in office today with concerns of upper abdominal pain that began last night, paitent describes pain as a dull ache that radiates to back. Patient states taking deep breaths inhaling and exhaling causes pain to be increased and sharp, patient also reports belching makes it more painful as well. Patient states that when he eats a meal and swallows he has discomfort in his upper abomen  . Knee Pain    Patient would like to address bilateral knee pain for the past two weeks.   . Shoulder Problem    Patient states that he has pain betweeen both of his shoulder blades, patient reports that he was lifting up a piece of equipment and moving in back and forth with his right shoulder and believes he over exerted him self.   We have elected to focus on his abdominal pain today. He started metformin at his last office visit and diarrhea just resolved 4 days ago. Reports formed stool today without nausea, vomiting, dysuria, fever or chills. States pain is dull with spasms. Not significantly affected by meals. He works at [employer] in a hot environment but states he drinks a lot of fluids during his workday. Recent colonoscopy with no diverticulosis.   Review of Systems     Objective:   Physical Exam  Constitutional: He appears well-developed and well-nourished. No distress.  Abdominal: Soft. There is tenderness (LUQ/flank in one specific area). There is rebound (on right side radiating to the LUQ).       Assessment:    1. Left upper quadrant pain: ? Secondary to metformin - HYDROcodone-acetaminophen (NORCO/VICODIN) 5-325 MG tablet; One every 4-6 hours as needed for pain  Dispense: 20 tablet; Refill: 0    Plan:    Stop metformin-phone f/u tomorrow.

## 2016-08-15 NOTE — Patient Instructions (Addendum)
Stop metformin. Call me in the AM on how you are doing.

## 2016-08-16 ENCOUNTER — Telehealth: Payer: Self-pay

## 2016-08-16 NOTE — Telephone Encounter (Signed)
Discussed continuing to stay off metformin and push fluids as he usually does. He has had a small kidney stone in the past but states the pain was a whole lot worse. Phone f/u on 5/21.

## 2016-08-16 NOTE — Telephone Encounter (Signed)
Pt reports his abdominal pain is about 50% better since yesterday.  He has stopped Metformin like you suggested.  He says he will stay off it through the weekend, unless you wanted him to do something else.  Please advised.   Thanks,   -Mickel Baas

## 2016-08-19 ENCOUNTER — Telehealth: Payer: Self-pay | Admitting: Family Medicine

## 2016-08-19 ENCOUNTER — Other Ambulatory Visit: Payer: Self-pay | Admitting: Family Medicine

## 2016-08-19 DIAGNOSIS — E119 Type 2 diabetes mellitus without complications: Secondary | ICD-10-CM

## 2016-08-19 MED ORDER — DAPAGLIFLOZIN PROPANEDIOL 5 MG PO TABS: ORAL_TABLET | ORAL | 2 refills | Status: DC

## 2016-08-19 MED ORDER — EMPAGLIFLOZIN 10 MG PO TABS: 10.0000 mg | ORAL_TABLET | Freq: Every day | ORAL | 2 refills | Status: DC

## 2016-08-19 NOTE — Telephone Encounter (Signed)
Please review. Nathan Hull, CMA  

## 2016-08-19 NOTE — Telephone Encounter (Signed)
Pt states he was seen last week and was told to stop his diabetes medication.  Pt is asking when does he need to start back taking his medication and does he need to take the same medication or something different?  CB#[phone]/MW

## 2016-08-19 NOTE — Telephone Encounter (Signed)
Abdominal pain resolved off metformin. Will try Jardiance.

## 2016-09-16 ENCOUNTER — Other Ambulatory Visit: Payer: Self-pay | Admitting: Family Medicine

## 2016-10-28 ENCOUNTER — Other Ambulatory Visit: Payer: Self-pay | Admitting: Family Medicine

## 2016-10-29 ENCOUNTER — Encounter: Payer: Self-pay | Admitting: Family Medicine

## 2016-10-29 ENCOUNTER — Ambulatory Visit (INDEPENDENT_AMBULATORY_CARE_PROVIDER_SITE_OTHER): Payer: BLUE CROSS/BLUE SHIELD | Admitting: Family Medicine

## 2016-10-29 VITALS — BP 140/100 | HR 56 | Temp 97.7°F | Resp 16 | Wt 302.6 lb

## 2016-10-29 DIAGNOSIS — E119 Type 2 diabetes mellitus without complications: Secondary | ICD-10-CM | POA: Diagnosis not present

## 2016-10-29 DIAGNOSIS — E782 Mixed hyperlipidemia: Secondary | ICD-10-CM | POA: Diagnosis not present

## 2016-10-29 DIAGNOSIS — S39012A Strain of muscle, fascia and tendon of lower back, initial encounter: Secondary | ICD-10-CM | POA: Diagnosis not present

## 2016-10-29 DIAGNOSIS — I10 Essential (primary) hypertension: Secondary | ICD-10-CM

## 2016-10-29 LAB — POCT GLYCOSYLATED HEMOGLOBIN (HGB A1C): Hemoglobin A1C: 6.7

## 2016-10-29 MED ORDER — DAPAGLIFLOZIN PROPANEDIOL 5 MG PO TABS: ORAL_TABLET | ORAL | 1 refills | Status: DC

## 2016-10-29 MED ORDER — HYDROCODONE-ACETAMINOPHEN 5-325 MG PO TABS: ORAL_TABLET | ORAL | 0 refills | Status: DC

## 2016-10-29 NOTE — Patient Instructions (Signed)
We will call you with the lab results. Let me know if you need more time off with your back strain. Start the muscle relaxants along with the pain medication.

## 2016-10-29 NOTE — Progress Notes (Signed)
Subjective:     Patient ID: Nathan Hull, male   DOB: [birth year], 56 y.o.   MRN: [id number]  HPI  Chief Complaint  Patient presents with  . Back Pain    Paitent comes in office today with complaints of mid back pain for the past week, patient describes pain as sharp and stabbing. Patient reports that pain has been radiating down to his right hip, and complains of difficulty walking. Patient has been taking otc Tylenol f ro pain relief.  . Diabetes    Patient returns to office for two month follow up, last office visit was 08/28/16. At last office visit patients HgbA1C was 8.3%, patient reports blood sugar readings at home hae been ranging from 140-250. Patient states that he is working on improving diet still and is staying active with yard work duties.   Placed on Farxiga as he did not tolerate metformin.   Review of Systems  Respiratory: Negative for shortness of breath.   Cardiovascular: Negative for chest pain and palpitations.       Objective:   Physical Exam  Constitutional: He appears well-developed and well-nourished. No distress.  Cardiovascular: Normal rate and regular rhythm.   Pulmonary/Chest: Breath sounds normal.  Musculoskeletal:  Muscle strength in lower extremities 5/5. SLR's to 60 degrees without radiation of back pain. Patient localizes pain to his right lower paravertebral area.       Assessment:    1. Diabetes mellitus without complication ([place]) - POCT glycosylated hemoglobin (Hb A1C) - dapagliflozin propanediol (FARXIGA) 5 MG TABS tablet; Take in the AM daily  Dispense: 90 tablet; Refill: 1  2. Mixed hyperlipidemia - Lipid panel  3. Benign essential HTN: cardiology f/u pending in the Fall - Comprehensive metabolic panel  4. Low back strain, initial encounter: will start home supply of muscle relaxant - HYDROcodone-acetaminophen (NORCO/VICODIN) 5-325 MG tablet; One every 4-6 hours as needed for pain  Dispense: 20 tablet; Refill: 0    Plan:    Work  excuse for 7/31-11/04/16. Encouraged diabetic eye exam. Further f/u pending lab results.

## 2016-11-01 ENCOUNTER — Ambulatory Visit: Payer: BLUE CROSS/BLUE SHIELD | Admitting: Family Medicine

## 2016-11-05 ENCOUNTER — Encounter: Payer: Self-pay | Admitting: Family Medicine

## 2016-11-19 ENCOUNTER — Encounter: Payer: Self-pay | Admitting: Family Medicine

## 2016-11-19 ENCOUNTER — Ambulatory Visit (INDEPENDENT_AMBULATORY_CARE_PROVIDER_SITE_OTHER): Payer: BLUE CROSS/BLUE SHIELD | Admitting: Family Medicine

## 2016-11-19 VITALS — BP 110/70 | HR 57 | Temp 98.0°F | Resp 17 | Wt 303.6 lb

## 2016-11-19 DIAGNOSIS — L089 Local infection of the skin and subcutaneous tissue, unspecified: Secondary | ICD-10-CM | POA: Diagnosis not present

## 2016-11-19 DIAGNOSIS — L720 Epidermal cyst: Secondary | ICD-10-CM

## 2016-11-19 NOTE — Patient Instructions (Signed)
Cleanse with soap and water daily and apply dressing.

## 2016-11-19 NOTE — Progress Notes (Signed)
Subjective:     Patient ID: Nathan Hull, male   DOB: [birth year], 56 y.o.   MRN: [id number]  HPI  Chief Complaint  Patient presents with  . Skin Problem    Patient comes in office today with complaints of boil on his right shoulder that has been present for the past 7 years. Patient states in the past week boil appears to be more inflammed and has been sore to the touch. Patient reports on 8/18 skin broke open and he has had clear yellow drainage since.      Review of Systems     Objective:   Physical Exam  Constitutional: He appears well-developed and well-nourished. No distress.  Skin:  Right shoulder with inflamed cyst. Procedure note: cleansed with alcohol and infiltrated with xylocaine with epinephrine. Copious amounts of cyst material expressed with lminimal pus. Dressing applied.       Assessment:    1. Infected epidermoid cyst - INCISION AND DRAINAGE; Future    Plan:    Cleanse with soap and water daily and apply dressing. Call if increased tenderness or purulent drainage.

## 2016-11-29 ENCOUNTER — Other Ambulatory Visit: Payer: Self-pay | Admitting: Family Medicine

## 2017-01-08 ENCOUNTER — Other Ambulatory Visit: Payer: Self-pay | Admitting: Family Medicine

## 2017-01-27 ENCOUNTER — Ambulatory Visit: Payer: Self-pay | Admitting: Family Medicine

## 2017-01-30 ENCOUNTER — Ambulatory Visit (INDEPENDENT_AMBULATORY_CARE_PROVIDER_SITE_OTHER): Payer: BLUE CROSS/BLUE SHIELD | Admitting: Family Medicine

## 2017-01-30 ENCOUNTER — Encounter: Payer: Self-pay | Admitting: Family Medicine

## 2017-01-30 VITALS — BP 134/84 | HR 54 | Temp 97.9°F | Resp 16 | Wt 303.0 lb

## 2017-01-30 DIAGNOSIS — E119 Type 2 diabetes mellitus without complications: Secondary | ICD-10-CM

## 2017-01-30 LAB — POCT GLYCOSYLATED HEMOGLOBIN (HGB A1C): HEMOGLOBIN A1C: 6.9

## 2017-01-30 NOTE — Progress Notes (Signed)
Subjective:     Patient ID: Nathan Hull, male   DOB: 09-24-1960, 56 y.o.   MRN: [id number]  HPI  Chief Complaint  Patient presents with  . Diabetes    Patient comes in office today for 3 month follow up, patient was last seen in office 10/29/16 and HgbA1C at last visit was 6.7%. Patient reports good compliance and tolerance n Farxiga, and denies any hyperglycemia incidents.   . Hypertension    Follow up from 10/29/16, patient blood pressure at last visit was 140/106.   States he has been eating more bread than usual and feels that has contributed to his A1C value today. He had to reschedule his cardiology follow up. Encouraged to get eye exam.   Review of Systems  Respiratory: Negative for shortness of breath.   Cardiovascular: Negative for chest pain and palpitations.  Musculoskeletal:       Reports sternal and shouler musculoskeletal pain for which he takes Tylenol 2000 mg/day and ibuprofen at times (not supposed to take due to heart disease). Wishes additional medication like tramadol.       Objective:   Physical Exam  Constitutional: He appears well-developed and well-nourished. No distress.  Lungs: clear Heart: RRR without murmur Lower extremities: no edema; pedal pulses intact, sensation to monofilament intact, no wounds noted.     Assessment:    1. Diabetes mellitus without complication ([place]) - POCT glycosylated hemoglobin (Hb A1C)    Plan:    Eye exam; 30 minutes of exercise daily, and try 3000 mg.of Tylenol for pain. Call next week if not helping pain.

## 2017-01-30 NOTE — Patient Instructions (Addendum)
Don't forget to get an eye exam. Continue regular exercise 30 minutes daily. Try Tylenol up to 3000 mg/day for muscular pain.

## 2017-02-13 ENCOUNTER — Other Ambulatory Visit: Payer: Self-pay | Admitting: Family Medicine

## 2017-03-27 ENCOUNTER — Other Ambulatory Visit: Payer: Self-pay | Admitting: Family Medicine

## 2017-03-27 NOTE — Telephone Encounter (Signed)
[facility] for a 90-days supply for the following medication. Thanks CC ° °atorvastatin (LIPITOR) 80 MG tablet  ° °

## 2017-03-28 MED ORDER — ATORVASTATIN CALCIUM 80 MG PO TABS: ORAL_TABLET | ORAL | 3 refills | Status: DC

## 2017-04-03 DIAGNOSIS — I25810 Atherosclerosis of coronary artery bypass graft(s) without angina pectoris: Secondary | ICD-10-CM | POA: Diagnosis not present

## 2017-04-03 DIAGNOSIS — I340 Nonrheumatic mitral (valve) insufficiency: Secondary | ICD-10-CM | POA: Diagnosis not present

## 2017-04-03 DIAGNOSIS — R0602 Shortness of breath: Secondary | ICD-10-CM | POA: Diagnosis not present

## 2017-04-03 DIAGNOSIS — I10 Essential (primary) hypertension: Secondary | ICD-10-CM | POA: Diagnosis not present

## 2017-04-03 DIAGNOSIS — I2510 Atherosclerotic heart disease of native coronary artery without angina pectoris: Secondary | ICD-10-CM | POA: Diagnosis not present

## 2017-04-25 DIAGNOSIS — R079 Chest pain, unspecified: Secondary | ICD-10-CM | POA: Diagnosis not present

## 2017-05-01 DIAGNOSIS — E782 Mixed hyperlipidemia: Secondary | ICD-10-CM | POA: Diagnosis not present

## 2017-05-01 DIAGNOSIS — I10 Essential (primary) hypertension: Secondary | ICD-10-CM | POA: Diagnosis not present

## 2017-05-01 DIAGNOSIS — I25810 Atherosclerosis of coronary artery bypass graft(s) without angina pectoris: Secondary | ICD-10-CM | POA: Diagnosis not present

## 2017-05-01 DIAGNOSIS — I2510 Atherosclerotic heart disease of native coronary artery without angina pectoris: Secondary | ICD-10-CM | POA: Diagnosis not present

## 2017-05-02 ENCOUNTER — Ambulatory Visit: Payer: Self-pay | Admitting: Family Medicine

## 2017-05-15 ENCOUNTER — Other Ambulatory Visit: Payer: Self-pay | Admitting: Family Medicine

## 2017-05-15 DIAGNOSIS — E119 Type 2 diabetes mellitus without complications: Secondary | ICD-10-CM

## 2017-07-01 ENCOUNTER — Encounter: Payer: Self-pay | Admitting: Family Medicine

## 2017-07-01 LAB — HM DIABETES EYE EXAM

## 2017-07-25 ENCOUNTER — Ambulatory Visit: Payer: BLUE CROSS/BLUE SHIELD | Admitting: Family Medicine

## 2017-07-25 ENCOUNTER — Encounter: Payer: Self-pay | Admitting: Family Medicine

## 2017-07-25 VITALS — BP 142/90 | HR 51 | Temp 98.1°F | Resp 16 | Wt 295.2 lb

## 2017-07-25 DIAGNOSIS — E119 Type 2 diabetes mellitus without complications: Secondary | ICD-10-CM | POA: Diagnosis not present

## 2017-07-25 DIAGNOSIS — G8929 Other chronic pain: Secondary | ICD-10-CM | POA: Diagnosis not present

## 2017-07-25 DIAGNOSIS — M25561 Pain in right knee: Secondary | ICD-10-CM | POA: Diagnosis not present

## 2017-07-25 DIAGNOSIS — M25562 Pain in left knee: Secondary | ICD-10-CM | POA: Diagnosis not present

## 2017-07-25 DIAGNOSIS — I10 Essential (primary) hypertension: Secondary | ICD-10-CM | POA: Diagnosis not present

## 2017-07-25 DIAGNOSIS — R0789 Other chest pain: Secondary | ICD-10-CM

## 2017-07-25 LAB — POCT GLYCOSYLATED HEMOGLOBIN (HGB A1C): Hemoglobin A1C: 7.1

## 2017-07-25 MED ORDER — TRAMADOL HCL 50 MG PO TABS: 50.0000 mg | ORAL_TABLET | Freq: Four times a day (QID) | ORAL | 0 refills | Status: DC | PRN

## 2017-07-25 NOTE — Patient Instructions (Signed)
Let me know how you are using the tramadol in the next week. Please have Dr. Chancy Milroy send [place] his office notes.

## 2017-07-25 NOTE — Progress Notes (Signed)
  Subjective:     Patient ID: CAP MASSI, male   DOB: 1960-08-03, 57 y.o.   MRN: [id number] Chief Complaint  Patient presents with  . Diabetes    Patient comes in office today for 3 month follow up, patient was last seen 01/30/17 and Hgb A1C in house was 6.9%. Patient denies polydipsia, polyuria, visual changes hyperglycmia incidents or changes to his feet . Patient reports that fasting blood sugards have been between 1304-140. Patient reports that he has had good compliance and tolerance on medication .   HPI States he continues to see cardiology, Dr. Chancy Milroy every 6 months. Discussed Pneumovax and Shingrix-defers for now.  Review of Systems  Constitutional:       Continues to have post incision sternal pain and chronic bilateral knee pain. This has not been relieved by 6-8 Tylenol daily. Wishes to use tramadol which he hs tried in the past.  Respiratory: Negative for shortness of breath.   Cardiovascular: Negative for chest pain and palpitations.       Objective:   Physical Exam  Constitutional: He appears well-developed and well-nourished. No distress.  Cardiovascular: Regular rhythm.  Bradycardic c/w beta blocker use  Pulmonary/Chest: Breath sounds normal.  Musculoskeletal: He exhibits no edema (of lower extremities).       Assessment:    1. Diabetes mellitus without complication ([place]): stable - POCT glycosylated hemoglobin (Hb A1C)  2. Benign essential HTN: pending cardiology f/u.  3. Chronic pain of both knees; will try tramadol  4. Sternal pain: try tramadol    Plan:    Phone f/u regarding tramadol use next week.

## 2017-07-30 ENCOUNTER — Telehealth: Payer: Self-pay | Admitting: Family Medicine

## 2017-07-30 ENCOUNTER — Other Ambulatory Visit: Payer: Self-pay | Admitting: Family Medicine

## 2017-07-30 MED ORDER — TRAMADOL HCL 50 MG PO TABS: 50.0000 mg | ORAL_TABLET | Freq: Four times a day (QID) | ORAL | 1 refills | Status: DC | PRN

## 2017-07-30 NOTE — Telephone Encounter (Signed)
I need to know how many he uses a day to reorder.

## 2017-07-30 NOTE — Telephone Encounter (Signed)
Have sent in #[phone] with one refill

## 2017-07-30 NOTE — Telephone Encounter (Signed)
Patient is taking his tramadol, 1 tablet in the morning and 1 in the afternoon.  He would like to be able to take 3 a day so he could have some relief at night when sleeping.

## 2017-07-30 NOTE — Telephone Encounter (Signed)
Pt wanted to let provider know the Tramadol is working well for him

## 2017-08-20 ENCOUNTER — Encounter: Payer: Self-pay | Admitting: Family Medicine

## 2017-08-20 ENCOUNTER — Ambulatory Visit: Payer: BLUE CROSS/BLUE SHIELD | Admitting: Family Medicine

## 2017-08-20 VITALS — BP 138/80 | HR 83 | Temp 97.4°F | Resp 16 | Ht 72.0 in | Wt 293.0 lb

## 2017-08-20 DIAGNOSIS — L301 Dyshidrosis [pompholyx]: Secondary | ICD-10-CM

## 2017-08-20 MED ORDER — FLUOCINONIDE 0.05 % EX CREA: 1.0000 "application " | TOPICAL_CREAM | Freq: Three times a day (TID) | CUTANEOUS | 1 refills | Status: DC

## 2017-08-20 NOTE — Progress Notes (Signed)
  Subjective:     Patient ID: Nathan Hull, male   DOB: 12-May-1960, 57 y.o.   MRN: [id number] Chief Complaint  Patient presents with  . Insect Bite    bilateral hands X 2 days    HPI States over the last 3 days he has developed itching and bumps on the palms of his hands and sides of his fingers. States the itching was worse last night despite use of an otc moisturizing cream. Reports he did not have any new exposures to chemicals or a known insect bite on his hands.  Review of Systems     Objective:   Physical Exam  Constitutional: He appears well-developed and well-nourished. No distress.  Skin:  Two symmetrical "tapioca" like vesicles on his palms at the base of his thumbs. He has fissuring on the sides of his fingers.       Assessment:    1. Dyshidrotic eczema - fluocinonide cream (LIDEX) 0.05 %; Apply 1 application topically 3 (three) times daily. To hands  Dispense: 30 g; Refill: 1    Plan:    Discussed mixing with moisturizing cream and apply 3 x day.

## 2017-08-20 NOTE — Patient Instructions (Signed)
Mix the steroid cream with equal amounts of moisturizing cream and apply 3 x day. Let me know if not helping.

## 2017-08-22 ENCOUNTER — Telehealth: Payer: Self-pay | Admitting: Family Medicine

## 2017-08-22 NOTE — Telephone Encounter (Signed)
Patient called stating the cream you gave him for his hands is really helping but he is almost out and wants another refill sent into [facility].

## 2017-08-22 NOTE — Telephone Encounter (Signed)
Please check with pharmacy as he should have a refill already.

## 2017-08-22 NOTE — Telephone Encounter (Signed)
[facility] and they stated patient just picked up Rx 2 days ago and he does have a refill but a new refill will not be covered by patient insurance. Patient will have to pay out of pocket. Pharmacist wanted you to be aware that he is using abundance amount of cream in a short amount of time. Please advise

## 2017-08-22 NOTE — Telephone Encounter (Signed)
Patient was advise of how to use the medication. Patient states he has been using up to 4 x daily. He was mixing it with some kind of moisturizer cream but will try a different type of moisturizer. 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 Also if flare up happen to help run under cool water to reduce further flare ups. Patient understand all that was explained and kindly as for another refill as he know he will be done with the current one and will collect the refill that is already at pharmacy. He just want to have it incase he needs it again or if you could suggest another type of medication such as Kenalog.

## 2017-08-22 NOTE — Telephone Encounter (Signed)
Let patient know that a 1/2 "  amount of the cream combined with the moisturizing cream will be sufficient per 3 x day application

## 2017-10-10 ENCOUNTER — Other Ambulatory Visit: Payer: Self-pay | Admitting: Family Medicine

## 2017-10-24 ENCOUNTER — Encounter: Payer: Self-pay | Admitting: Family Medicine

## 2017-10-24 ENCOUNTER — Ambulatory Visit: Payer: BLUE CROSS/BLUE SHIELD | Admitting: Family Medicine

## 2017-10-24 VITALS — BP 122/80 | HR 52 | Temp 98.1°F | Resp 16 | Wt 293.8 lb

## 2017-10-24 DIAGNOSIS — Z125 Encounter for screening for malignant neoplasm of prostate: Secondary | ICD-10-CM | POA: Diagnosis not present

## 2017-10-24 DIAGNOSIS — I10 Essential (primary) hypertension: Secondary | ICD-10-CM

## 2017-10-24 DIAGNOSIS — E119 Type 2 diabetes mellitus without complications: Secondary | ICD-10-CM

## 2017-10-24 DIAGNOSIS — E782 Mixed hyperlipidemia: Secondary | ICD-10-CM | POA: Diagnosis not present

## 2017-10-24 LAB — POCT GLYCOSYLATED HEMOGLOBIN (HGB A1C): HEMOGLOBIN A1C: 6.9 % — AB (ref 4.0–5.6)

## 2017-10-24 NOTE — Patient Instructions (Signed)
Don't forget your eye exam this year. We will call you with the lab results.

## 2017-10-24 NOTE — Progress Notes (Signed)
  Subjective:     Patient ID: Nathan Hull, male   DOB: 05-16-1960, 57 y.o.   MRN: [id number] Chief Complaint  Patient presents with  . Diabetes    Patient comes in office today for follow up from 07/25/17, at last visit HgbA1C was 7.1%. Patient denies symptoms of polydipsia or polyuria, he reports good compliance and tolerance on medication.   . Hypertension    Follow up from 07/25/17, blood pressure at last visit was 142/90. Patient states that he missed cardiology appt but will be making follow up here in the next few weeks.    HPI States he feels well and has been eating more oatmeal.Works in a hot environment but does his best to keep up with fluids.  Review of Systems  Respiratory: Negative for shortness of breath.   Cardiovascular: Negative for chest pain and palpitations.       Objective:   Physical Exam  Constitutional: He appears well-developed and well-nourished. No distress.  Cardiovascular: Regular rhythm.  Bradycardia per medication  Pulmonary/Chest: Breath sounds normal.  Musculoskeletal: He exhibits no edema (of lower extremities).       Assessment:    1. Diabetes mellitus without complication [facility]): controlled - POCT glycosylated hemoglobin (Hb A1C)  2. Benign essential HTN: controlled - Comprehensive metabolic panel  3. Screening for prostate cancer - PSA  4. Mixed hyperlipidemia - Lipid panel    Plan:    Further f/u pending lab results. Reminded about eye exam.

## 2017-10-29 DIAGNOSIS — I10 Essential (primary) hypertension: Secondary | ICD-10-CM | POA: Diagnosis not present

## 2017-10-29 DIAGNOSIS — E782 Mixed hyperlipidemia: Secondary | ICD-10-CM | POA: Diagnosis not present

## 2017-10-30 ENCOUNTER — Telehealth: Payer: Self-pay

## 2017-10-30 LAB — LIPID PANEL
CHOL/HDL RATIO: 4.6 ratio (ref 0.0–5.0)
Cholesterol, Total: 134 mg/dL (ref 100–199)
HDL: 29 mg/dL — ABNORMAL LOW (ref >39)
LDL Calculated: 65 mg/dL (ref 0–99)
Triglycerides: 198 mg/dL — ABNORMAL HIGH (ref 0–149)
VLDL Cholesterol Cal: 40 mg/dL (ref 5–40)

## 2017-10-30 LAB — COMPREHENSIVE METABOLIC PANEL
ALT: 22 IU/L (ref 0–44)
AST: 13 IU/L (ref 0–40)
Albumin/Globulin Ratio: 1.8 (ref 1.2–2.2)
Albumin: 4.4 g/dL (ref 3.5–5.5)
Alkaline Phosphatase: 100 IU/L (ref 39–117)
BILIRUBIN TOTAL: 0.4 mg/dL (ref 0.0–1.2)
BUN/Creatinine Ratio: 18 (ref 9–20)
BUN: 16 mg/dL (ref 6–24)
CALCIUM: 9.4 mg/dL (ref 8.7–10.2)
CHLORIDE: 96 mmol/L (ref 96–106)
CO2: 27 mmol/L (ref 20–29)
Creatinine, Ser: 0.9 mg/dL (ref 0.76–1.27)
GFR calc Af Amer: 111 mL/min/{1.73_m2} (ref >59)
GFR, EST NON AFRICAN AMERICAN: 96 mL/min/{1.73_m2} (ref >59)
Globulin, Total: 2.5 g/dL (ref 1.5–4.5)
Glucose: 135 mg/dL — ABNORMAL HIGH (ref 65–99)
Potassium: 4.7 mmol/L (ref 3.5–5.2)
Sodium: 143 mmol/L (ref 134–144)
Total Protein: 6.9 g/dL (ref 6.0–8.5)

## 2017-10-30 NOTE — Telephone Encounter (Signed)
Pt called back and I let him know that his labs are ok and to continue on current meds.  Con Memos

## 2017-10-30 NOTE — Telephone Encounter (Signed)
-----   Message from Carmon Ginsberg, [place] sent at 10/30/2017  7:38 AM EDT ----- Labs ok-continue current medication

## 2017-10-30 NOTE — Telephone Encounter (Signed)
Unable to reach patient at this moment,voicemailbox has not been set up yet will not try again at a later time. KW

## 2017-11-14 ENCOUNTER — Other Ambulatory Visit: Payer: Self-pay | Admitting: Family Medicine

## 2017-11-14 DIAGNOSIS — E119 Type 2 diabetes mellitus without complications: Secondary | ICD-10-CM

## 2017-11-26 DIAGNOSIS — R0602 Shortness of breath: Secondary | ICD-10-CM | POA: Diagnosis not present

## 2017-11-26 DIAGNOSIS — I10 Essential (primary) hypertension: Secondary | ICD-10-CM | POA: Diagnosis not present

## 2017-11-26 DIAGNOSIS — I2510 Atherosclerotic heart disease of native coronary artery without angina pectoris: Secondary | ICD-10-CM | POA: Diagnosis not present

## 2017-11-26 DIAGNOSIS — E782 Mixed hyperlipidemia: Secondary | ICD-10-CM | POA: Diagnosis not present

## 2017-11-27 ENCOUNTER — Other Ambulatory Visit: Payer: Self-pay | Admitting: Family Medicine

## 2017-11-27 ENCOUNTER — Telehealth: Payer: Self-pay | Admitting: Family Medicine

## 2017-11-27 NOTE — Telephone Encounter (Signed)
Last filled 10/10/17. KW

## 2017-11-27 NOTE — Telephone Encounter (Signed)
Okay I went back through chart looked under medical history, historical and telephone encounters and could not find date except for 10/10/17 when it was last filled. I will notify patient that it is to early to fill.  KW

## 2017-11-27 NOTE — Telephone Encounter (Signed)
[facility] for the following medication. Thanks CC ° °traMADol (ULTRAM) 50 MG tablet  ° °

## 2017-11-27 NOTE — Telephone Encounter (Signed)
It was last filled 11/06/17. He is supposed to be using 3/day so refill not due for 9 days.

## 2017-12-03 ENCOUNTER — Other Ambulatory Visit: Payer: Self-pay | Admitting: Family Medicine

## 2017-12-04 ENCOUNTER — Other Ambulatory Visit: Payer: Self-pay | Admitting: Family Medicine

## 2017-12-10 ENCOUNTER — Ambulatory Visit: Payer: BLUE CROSS/BLUE SHIELD | Admitting: Family Medicine

## 2017-12-10 ENCOUNTER — Encounter: Payer: Self-pay | Admitting: Family Medicine

## 2017-12-10 ENCOUNTER — Ambulatory Visit
Admission: RE | Admit: 2017-12-10 | Discharge: 2017-12-10 | Disposition: A | Discharge status: Home / Self Care | Payer: BLUE CROSS/BLUE SHIELD | Source: Ambulatory Visit | Attending: Family Medicine | Admitting: Family Medicine

## 2017-12-10 VITALS — BP 140/88 | HR 56 | Temp 98.0°F | Resp 16 | Wt 294.0 lb

## 2017-12-10 DIAGNOSIS — M25519 Pain in unspecified shoulder: Secondary | ICD-10-CM

## 2017-12-10 DIAGNOSIS — M542 Cervicalgia: Secondary | ICD-10-CM

## 2017-12-10 DIAGNOSIS — M47812 Spondylosis without myelopathy or radiculopathy, cervical region: Secondary | ICD-10-CM | POA: Insufficient documentation

## 2017-12-10 DIAGNOSIS — Z981 Arthrodesis status: Secondary | ICD-10-CM | POA: Diagnosis not present

## 2017-12-10 MED ORDER — TRAMADOL HCL 50 MG PO TABS: 50.0000 mg | ORAL_TABLET | Freq: Four times a day (QID) | ORAL | 0 refills | Status: DC | PRN

## 2017-12-10 NOTE — Progress Notes (Signed)
  Subjective:     Patient ID: Nathan Hull, male   DOB: Apr 18, 1960, 57 y.o.   MRN: [id number] Chief Complaint  Patient presents with  . Neck Pain    Patient comes in office today to discuss pain managment for his neck, shoulders and upper back. Patient states that he was taking Tramadol and it was helping for pain that he describes as stiff.    HPI States that his anterior neck and upper arms and shoulders are stiff and remain at 2-3 level of pain. Reports radicular pain down to his right elbow: "My neck locks up." He has increased use of tramadol to every 6 hours as a result. 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 Hx of cervical fusion.  Review of Systems     Objective:   Physical Exam  Constitutional: He appears well-developed and well-nourished. No distress.  Musculoskeletal:  Cervical FROM without pain. Grip strength 5/5       Assessment:    1. Neck and shoulder pain: refilled tramadol - DG Cervical Spine Complete; Future    Plan:    Further f/u pending x-ray results. May use Tylenol up to 3000 mg/day. Probable orthopedic referral.

## 2017-12-10 NOTE — Patient Instructions (Signed)
We will call you with the x-ray report. Discussed use of tylenol up to 3000 mg/day.

## 2017-12-11 ENCOUNTER — Other Ambulatory Visit: Payer: Self-pay | Admitting: Family Medicine

## 2017-12-11 ENCOUNTER — Telehealth: Payer: Self-pay

## 2017-12-11 DIAGNOSIS — M542 Cervicalgia: Secondary | ICD-10-CM

## 2017-12-11 NOTE — Telephone Encounter (Signed)
Patient advised advised but request that we schedule ortho appt about two weeks out when he is free from work. KW

## 2017-12-11 NOTE — Telephone Encounter (Signed)
Referral in progress for two weeks.

## 2017-12-11 NOTE — Telephone Encounter (Signed)
-----   Message from Carmon Ginsberg, [place] sent at 12/11/2017  7:26 AM EDT ----- Mild arthritic changes with stable fusion. Would recommend orthopedic referral for further evaluation. Do you wish to proceed?

## 2017-12-22 DIAGNOSIS — M542 Cervicalgia: Secondary | ICD-10-CM | POA: Diagnosis not present

## 2017-12-26 DIAGNOSIS — M542 Cervicalgia: Secondary | ICD-10-CM | POA: Diagnosis not present

## 2017-12-31 DIAGNOSIS — M542 Cervicalgia: Secondary | ICD-10-CM | POA: Diagnosis not present

## 2018-01-02 DIAGNOSIS — M542 Cervicalgia: Secondary | ICD-10-CM | POA: Diagnosis not present

## 2018-01-07 DIAGNOSIS — I25810 Atherosclerosis of coronary artery bypass graft(s) without angina pectoris: Secondary | ICD-10-CM | POA: Diagnosis not present

## 2018-01-07 DIAGNOSIS — I2510 Atherosclerotic heart disease of native coronary artery without angina pectoris: Secondary | ICD-10-CM | POA: Diagnosis not present

## 2018-01-07 DIAGNOSIS — E782 Mixed hyperlipidemia: Secondary | ICD-10-CM | POA: Diagnosis not present

## 2018-01-07 DIAGNOSIS — I10 Essential (primary) hypertension: Secondary | ICD-10-CM | POA: Diagnosis not present

## 2018-01-09 DIAGNOSIS — M542 Cervicalgia: Secondary | ICD-10-CM | POA: Diagnosis not present

## 2018-01-12 ENCOUNTER — Other Ambulatory Visit: Payer: Self-pay | Admitting: Family Medicine

## 2018-01-15 DIAGNOSIS — M542 Cervicalgia: Secondary | ICD-10-CM | POA: Diagnosis not present

## 2018-01-18 ENCOUNTER — Other Ambulatory Visit: Payer: Self-pay | Admitting: Family Medicine

## 2018-01-23 ENCOUNTER — Ambulatory Visit: Payer: Self-pay | Admitting: Family Medicine

## 2018-01-26 ENCOUNTER — Ambulatory Visit: Payer: BLUE CROSS/BLUE SHIELD | Admitting: Family Medicine

## 2018-01-26 ENCOUNTER — Encounter: Payer: Self-pay | Admitting: Family Medicine

## 2018-01-26 ENCOUNTER — Other Ambulatory Visit: Payer: Self-pay

## 2018-01-26 ENCOUNTER — Other Ambulatory Visit: Payer: Self-pay | Admitting: Family Medicine

## 2018-01-26 VITALS — BP 140/84 | HR 65 | Temp 98.3°F | Ht 72.0 in | Wt 293.0 lb

## 2018-01-26 DIAGNOSIS — K644 Residual hemorrhoidal skin tags: Secondary | ICD-10-CM | POA: Diagnosis not present

## 2018-01-26 DIAGNOSIS — E119 Type 2 diabetes mellitus without complications: Secondary | ICD-10-CM | POA: Diagnosis not present

## 2018-01-26 LAB — POCT GLYCOSYLATED HEMOGLOBIN (HGB A1C): HEMOGLOBIN A1C: 7.4 % — AB (ref 4.0–5.6)

## 2018-01-26 LAB — SPECIMEN STATUS REPORT

## 2018-01-26 LAB — PSA

## 2018-01-26 MED ORDER — DAPAGLIFLOZIN PROPANEDIOL 10 MG PO TABS: 10.0000 mg | ORAL_TABLET | Freq: Every day | ORAL | 1 refills | Status: DC

## 2018-01-26 MED ORDER — HYDROCORTISONE ACE-PRAMOXINE 2.5-1 % RE CREA: 1.0000 "application " | TOPICAL_CREAM | Freq: Three times a day (TID) | RECTAL | 0 refills | Status: DC

## 2018-01-26 NOTE — Progress Notes (Signed)
  Subjective:     Patient ID: Nathan Hull, male   DOB: 1961-03-01, 57 y.o.   MRN: [id number] Chief Complaint  Patient presents with  . Diabetes    3 month fup  . Hemorrhoids    started Thursday 01/22/18 and had been severe everyday since.  has been blood present in the stool   HPI States he does not strain with bowel movements but does have gas at times and occasional "explosive" bowel movements. Does have to lift in his job. Has tried otc medications without improvement. Last colonoscopy in 2018 with internal hemorrhoids and polypectomy. Repots recent follow up with cardiology, orthopedics and optometry.  Review of Systems     Objective:   Physical Exam  Constitutional: He appears well-developed and well-nourished. No distress.  Genitourinary:  Genitourinary Comments: Two small external hemorrhoids noted  Lungs: clear Heart: RRR without murmur Lower extremities: no edema; pedal pulses intact, sensation to monofilament intact, no wounds noted.     Assessment:    1. Diabetes mellitus without complication ([place]): increase Farxiga to 10 mg. (#14 samples provided)  - POCT glycosylated hemoglobin (Hb A1C)  2. External hemorrhoids - hydrocortisone-pramoxine (ANALPRAM HC) 2.5-1 % rectal cream; Place 1 application rectally 3 (three) times daily.  Dispense: 30 g; Refill: 0    Plan:   Discussed bathtub soaks. If not improving will call for G.I. Referral for possible banding procedure.

## 2018-01-26 NOTE — Patient Instructions (Signed)
Start bathtub soaks for 10 minutes in warm water daily. Call me if hemorrhoids not improving over the next week.

## 2018-01-27 NOTE — Telephone Encounter (Signed)
Patient is acquiring rx through other means.

## 2018-01-30 ENCOUNTER — Ambulatory Visit: Payer: Self-pay | Admitting: Family Medicine

## 2018-02-02 ENCOUNTER — Encounter: Payer: Self-pay | Admitting: Family Medicine

## 2018-02-02 ENCOUNTER — Ambulatory Visit: Payer: BLUE CROSS/BLUE SHIELD | Admitting: Family Medicine

## 2018-02-02 VITALS — BP 150/88 | HR 93 | Temp 97.7°F | Wt 294.8 lb

## 2018-02-02 DIAGNOSIS — K625 Hemorrhage of anus and rectum: Secondary | ICD-10-CM | POA: Diagnosis not present

## 2018-02-02 DIAGNOSIS — K644 Residual hemorrhoidal skin tags: Secondary | ICD-10-CM | POA: Diagnosis not present

## 2018-02-02 DIAGNOSIS — R102 Pelvic and perineal pain: Secondary | ICD-10-CM | POA: Diagnosis not present

## 2018-02-02 LAB — POCT URINALYSIS DIPSTICK
Bilirubin, UA: NEGATIVE
Glucose, UA: POSITIVE — AB
Ketones, UA: NEGATIVE
Leukocytes, UA: NEGATIVE
Nitrite, UA: NEGATIVE
Protein, UA: NEGATIVE
RBC UA: NEGATIVE
Spec Grav, UA: 1.015 (ref 1.010–1.025)
Urobilinogen, UA: 0.2 E.U./dL
pH, UA: 5 (ref 5.0–8.0)

## 2018-02-02 NOTE — Patient Instructions (Signed)
Discussed use of Nupercainal (dibucaine) and Tuck's pads (witch hazel). We will call about with the referral to G.I.

## 2018-02-02 NOTE — Progress Notes (Signed)
  Subjective:     Patient ID: Nathan Hull, male   DOB: Aug 09, 1960, 57 y.o.   MRN: [id number] Chief Complaint  Patient presents with  . Urinary Tract Infection    Patient presents today for possible UTI. Patient states that is has been hard for him to urinate and pressure.   HPI He states he feels the pressure from his anus to his testicles.Reports it improves after urinating and moving his bowels. He also is continuing to experience hemorrhoidal pain and bright red rectal bleeding despite use of Analpram HC. He has not been able to start bathtub soaks but has used a wet, hot, washcloth. 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 He is on [place] but stopped it today as he was concerned he had a UTI.  Review of Systems     Objective:   Physical Exam  Constitutional: He appears well-developed and well-nourished. He appears distressed (mild discomfort sitting ).  Genitourinary:  Genitourinary Comments: No perineal or testicle tenderness. External hemorrhoids noted x 2.       Assessment:    1. External hemorrhoids - Ambulatory referral to Gastroenterology  2. Bright red rectal bleeding - Ambulatory referral to Gastroenterology  3. Perineal pain - Ambulatory referral to Gastroenterology - POCT urinalysis dipstick    Plan:    Discussed otc use of dibucaine and witch hazel pads.

## 2018-02-03 ENCOUNTER — Ambulatory Visit: Payer: BLUE CROSS/BLUE SHIELD | Admitting: Gastroenterology

## 2018-02-03 ENCOUNTER — Encounter: Payer: Self-pay | Admitting: Gastroenterology

## 2018-02-03 VITALS — BP 151/96 | HR 62 | Resp 16 | Ht 72.0 in | Wt 294.0 lb

## 2018-02-03 DIAGNOSIS — K600 Acute anal fissure: Secondary | ICD-10-CM

## 2018-02-03 DIAGNOSIS — K641 Second degree hemorrhoids: Secondary | ICD-10-CM | POA: Diagnosis not present

## 2018-02-03 NOTE — Progress Notes (Signed)
[name], MD [address]  [place]  [place], [place] [address]  Main: [phone]  Fax: [phone]    Gastroenterology Consultation  Referring Provider:     Carmon Ginsberg, [place] Primary Care Physician:  Carmon Ginsberg, [place] Primary Gastroenterologist:  Dr. [name] Reason for Consultation:     Rectal discomfort, rectal pain        HPI:   Nathan Hull is a 57 y.o. male referred by Dr. Carmon Ginsberg, [place]  for consultation & management of 2 weeks history of severe rectal pain associated with itching, burning and pressure.  He describes the pain as sharp, shooting type and has tried hydrocortisone cream which did not help.  Therefore, he is referred here for further evaluation.  He reports that his stool consistency is variable from firm to lose.  He just started taking stool softener.   His diet is devoid of fiber, reports consuming red meat, fried foods on a regular basis  NSAIDs: None  Antiplts/Anticoagulants/Anti thrombotics: On Plavix, aspirin 325 mg for history of coronary artery disease  GI Procedures:  Colonoscopy 04/26/2016 - One 2 mm polyp in the cecum, removed with a cold biopsy forceps. Resected and retrieved. - One 7 mm polyp in the rectum, removed with a cold snare. Resected and retrieved. Clip (MR conditional) was placed. - Non-bleeding internal hemorrhoids. Pathology report not available  Past Medical History:  Diagnosis Date  . Arthritis   . CAD (coronary artery disease)   . Difficult intubation 09/24/2013   reports told difficult after CABG at [facility].  Glidescope used  . Dyspnea   . Family history of anesthesia complication    mother had N/V after valve replacement  . Hyperlipidemia   . Hypertension   . Myocardial infarct ([place]) 2007  . Obesity   . Wears contact lenses     Past Surgical History:  Procedure Laterality Date  . APPLICATION OF WOUND VAC Right 10/22/2013   Procedure: APPLICATION OF WOUND VAC;  Surgeon: [name], MD;  Location: [place];  Service: Vascular;  Laterality: Right;  . C-Spine disc replacement x 2     06/16/2012  . CARDIAC CATHETERIZATION     last week @ [facility]  . cardiac stents     has been cathed x 4   Had  MI  in 2007  . CARPAL TUNNEL RELEASE     on right  . COLONOSCOPY WITH PROPOFOL N/A 04/26/2016   Procedure: COLONOSCOPY WITH PROPOFOL;  Surgeon: [name], MD;  Location: [place];  Service: Endoscopy;  Laterality: N/A;  . CORONARY ARTERY BYPASS GRAFT N/A 09/24/2013   Procedure: CORONARY ARTERY BYPASS GRAFTING (CABG) x three, using LIMA to LAD, and right leg greater saphenous vein harvested endoscopically - SVG to Ramus, SVG to PL;  Surgeon: [name], MD;  Location: [place];  Service: Open Heart Surgery;  Laterality: N/A;  . I&D EXTREMITY Right 10/22/2013   Procedure: IRRIGATION AND DEBRIDEMENT EXTREMITY-RIGHT LEG;  Surgeon: [name], MD;  Location: [place];  Service: Vascular;  Laterality: Right;  . INTRAOPERATIVE TRANSESOPHAGEAL ECHOCARDIOGRAM N/A 09/24/2013   Procedure: INTRAOPERATIVE TRANSESOPHAGEAL ECHOCARDIOGRAM;  Surgeon: [name], MD;  Location: [place];  Service: Open Heart Surgery;  Laterality: N/A;  . POLYPECTOMY  04/26/2016   Procedure: POLYPECTOMY;  Surgeon: [name], MD;  Location: [place];  Service: Endoscopy;;  . PTCA at [facility]     04/28/2005  . SPINAL FUSION  vertebra, congentital : Dr. Annette Stable   . TONSILLECTOMY      Current Outpatient Medications:  .  aspirin EC 325 MG EC tablet, Take 1 tablet (325 mg total) by mouth daily., Disp: , Rfl:  .  atorvastatin (LIPITOR) 80 MG tablet, 1 (ONE) TABLET TABLET, ORAL, DAILY, Disp: 90 tablet, Rfl: 3 .  Blood Glucose Monitoring Suppl (ONE TOUCH ULTRA MINI) w/Device KIT, USE AS DIRECTED, Disp: 1 each, Rfl: 0 .  clopidogrel (PLAVIX) 75 MG tablet, TAKE 1 TABLET BY MOUTH EVERY DAY, Disp: 90 tablet, Rfl: 3 .  dapagliflozin propanediol (FARXIGA) 10 MG TABS tablet, Take 10 mg by mouth daily.,  Disp: 90 tablet, Rfl: 1 .  fluocinonide cream (LIDEX) 1.63 %, Apply 1 application topically 3 (three) times daily. To hands, Disp: 30 g, Rfl: 1 .  hydrocortisone-pramoxine (ANALPRAM-HC) 2.5-1 % rectal cream, PLACE 1 APPLICATION RECTALLY 3 (THREE) TIMES DAILY., Disp: 120 g, Rfl: 0 .  lisinopril (PRINIVIL,ZESTRIL) 20 MG tablet, Take 20 mg by mouth daily., Disp: , Rfl: 1 .  metoprolol tartrate (LOPRESSOR) 50 MG tablet, 1 TABLET, ORAL, TWO TIMES DAILY, Disp: 180 tablet, Rfl: 3 .  ONE TOUCH ULTRA TEST test strip, USE AS DIRECTED, Disp: 100 each, Rfl: 1 .  ONETOUCH DELICA LANCETS FINE MISC, See admin instructions., Disp: , Rfl: 0 .  traMADol (ULTRAM) 50 MG tablet, TAKE 1 TABLET (50 MG TOTAL) BY MOUTH EVERY 6 (SIX) HOURS AS NEEDED FOR MODERATE PAIN., Disp: 120 tablet, Rfl: 0   Family History  Problem Relation Age of Onset  . Hypertension Mother   . Rheumatic fever Mother        S/P MVR  . Hypertension Father      Social History   Tobacco Use  . Smoking status: Former Smoker    Packs/day: 2.00    Types: Cigarettes    Last attempt to quit: 04/23/2005    Years since quitting: 12.7  . Smokeless tobacco: Never Used  Substance Use Topics  . Alcohol use: Yes    Alcohol/week: 6.0 standard drinks    Types: 6 Cans of beer per week    Comment: half a 6 pack a month  . Drug use: No    Allergies as of 02/03/2018 - Review Complete 02/03/2018  Allergen Reaction Noted  . Metformin and related Other (See Comments) 08/19/2016    Review of Systems:    All systems reviewed and negative except where noted in HPI.   Physical Exam:  BP (!) 151/96 (BP Location: Left Arm, Patient Position: Sitting, Cuff Size: Large)   Pulse 62   Resp 16   Ht 6' (1.829 m)   Wt 294 lb (133.4 kg)   BMI 39.87 kg/m  No LMP for male patient.  General:   Alert,  Well-developed, well-nourished, pleasant and cooperative in NAD Head:  Normocephalic and atraumatic. Eyes:  Sclera clear, no icterus.   Conjunctiva  pink. Ears:  Normal auditory acuity. Nose:  No deformity, discharge, or lesions. Mouth:  No deformity or lesions,oropharynx pink & moist. Neck:  Supple; no masses or thyromegaly. Lungs:  Respirations even and unlabored.  Clear throughout to auscultation.   No wheezes, crackles, or rhonchi. No acute distress. Heart:  Regular rate and rhythm; no murmurs, clicks, rubs, or gallops. Abdomen:  Normal bowel sounds. Soft, non-tender and non-distended without masses, hepatosplenomegaly or hernias noted.  No guarding or rebound tenderness.   Rectal: Severe pain in the posterior wall of the anal canal consistent with anal fissure, perianal rash,  no lesions seen Msk:  Symmetrical without gross deformities. Good, equal movement & strength bilaterally. Pulses:  Normal pulses noted. Extremities:  No clubbing or edema.  No cyanosis. Neurologic:  Alert and oriented x3;  grossly normal neurologically. Skin:  Intact without significant lesions or rashes. No jaundice. Lymph Nodes:  No significant cervical adenopathy. Psych:  Alert and cooperative. Normal mood and affect.  Imaging Studies: None  Assessment and Plan:   Nathan Hull is a 57 y.o. Caucasian male with history of coronary artery disease on DAPT, hypertension presents with 2 weeks history of severe rectal pain/pressure, rectal exam consistent with posterior anal fissure  Anal fissure: Recommend 0.125% nitroglycerin with lidocaine 2-3 times daily, discussed about necessary precautions and directions provided  Symptomatic hemorrhoids: Discussed with him about outpatient hemorrhoid ligation after healing of the anal fissure  History of colon polyps based on the colonoscopy 2018 Will obtain the pathology report Surveillance in 5 to 10 years after reviewing pathology results   Follow up in 4 weeks   [name], MD

## 2018-02-03 NOTE — Patient Instructions (Signed)
High-Fiber Diet  Fiber, also called dietary fiber, is a type of carbohydrate found in fruits, vegetables, whole grains, and beans. A high-fiber diet can have many health benefits. Your health care provider may recommend a high-fiber diet to help:  · Prevent constipation. Fiber can make your bowel movements more regular.  · Lower your cholesterol.  · Relieve hemorrhoids, uncomplicated diverticulosis, or irritable bowel syndrome.  · Prevent overeating as part of a weight-loss plan.  · Prevent heart disease, type 2 diabetes, and certain cancers.    What is my plan?  The recommended daily intake of fiber includes:  · 38 grams for men under age 50.  · 30 grams for men over age 50.  · 25 grams for women under age 50.  · 21 grams for women over age 50.    You can get the recommended daily intake of dietary fiber by eating a variety of fruits, vegetables, grains, and beans. Your health care provider may also recommend a fiber supplement if it is not possible to get enough fiber through your diet.  What do I need to know about a high-fiber diet?  · Fiber supplements have not been widely studied for their effectiveness, so it is better to get fiber through food sources.  · Always check the fiber content on the nutrition facts label of any prepackaged food. Look for foods that contain at least 5 grams of fiber per serving.  · Ask your dietitian if you have questions about specific foods that are related to your condition, especially if those foods are not listed in the following section.  · Increase your daily fiber consumption gradually. Increasing your intake of dietary fiber too quickly may cause bloating, cramping, or gas.  · Drink plenty of water. Water helps you to digest fiber.  What foods can I eat?  Grains  Whole-grain breads. Multigrain cereal. Oats and oatmeal. Brown rice. Barley. Bulgur wheat. Millet. Bran muffins. Popcorn. Rye wafer crackers.  Vegetables   Sweet potatoes. Spinach. Kale. Artichokes. Cabbage. Broccoli. Green peas. Carrots. Squash.  Fruits  Berries. Pears. Apples. Oranges. Avocados. Prunes and raisins. Dried figs.  Meats and Other Protein Sources  Navy, kidney, pinto, and soy beans. Split peas. Lentils. Nuts and seeds.  Dairy  Fiber-fortified yogurt.  Beverages  Fiber-fortified soy milk. Fiber-fortified orange juice.  Other  Fiber bars.  The items listed above may not be a complete list of recommended foods or beverages. Contact your dietitian for more options.  What foods are not recommended?  Grains  White bread. Pasta made with refined flour. White rice.  Vegetables  Fried potatoes. Canned vegetables. Well-cooked vegetables.  Fruits  Fruit juice. Cooked, strained fruit.  Meats and Other Protein Sources  Fatty cuts of meat. Fried poultry or fried fish.  Dairy  Milk. Yogurt. Cream cheese. Sour cream.  Beverages  Soft drinks.  Other  Cakes and pastries. Butter and oils.  The items listed above may not be a complete list of foods and beverages to avoid. Contact your dietitian for more information.  What are some tips for including high-fiber foods in my diet?  · Eat a wide variety of high-fiber foods.  · Make sure that half of all grains consumed each day are whole grains.  · Replace breads and cereals made from refined flour or white flour with whole-grain breads and cereals.  · Replace white rice with brown rice, bulgur wheat, or millet.  · Start the day with a breakfast that is high in fiber,   such as a cereal that contains at least 5 grams of fiber per serving.  · Use beans in place of meat in soups, salads, or pasta.  · Eat high-fiber snacks, such as berries, raw vegetables, nuts, or popcorn.  This information is not intended to replace advice given to you by your health care provider. Make sure you discuss any questions you have with your health care provider.  Document Released: 03/18/2005 Document Revised: 08/24/2015 Document Reviewed: 08/31/2013   Elsevier Interactive Patient Education © 2018 [employer]

## 2018-02-10 ENCOUNTER — Other Ambulatory Visit: Payer: Self-pay | Admitting: Family Medicine

## 2018-02-10 ENCOUNTER — Telehealth: Payer: Self-pay | Admitting: Gastroenterology

## 2018-02-10 NOTE — Telephone Encounter (Signed)
Pt is calling his symptoms  are getting  Worse he is having bleeding diarrhea he is missing work he would like a call from nurse please

## 2018-02-10 NOTE — Telephone Encounter (Signed)
Patient last seen in office for follow up HTN on 10/24/17 medication last filled 02/13/17. KW

## 2018-02-11 ENCOUNTER — Other Ambulatory Visit: Payer: Self-pay | Admitting: Family Medicine

## 2018-02-12 ENCOUNTER — Emergency Department
Admission: EM | Admit: 2018-02-12 | Discharge: 2018-02-12 | Disposition: A | Discharge status: Home / Self Care | Payer: BLUE CROSS/BLUE SHIELD | Attending: Emergency Medicine | Admitting: Emergency Medicine

## 2018-02-12 ENCOUNTER — Emergency Department: Payer: BLUE CROSS/BLUE SHIELD

## 2018-02-12 DIAGNOSIS — Z79899 Other long term (current) drug therapy: Secondary | ICD-10-CM | POA: Diagnosis not present

## 2018-02-12 DIAGNOSIS — K8020 Calculus of gallbladder without cholecystitis without obstruction: Secondary | ICD-10-CM | POA: Diagnosis not present

## 2018-02-12 DIAGNOSIS — Z87891 Personal history of nicotine dependence: Secondary | ICD-10-CM | POA: Diagnosis not present

## 2018-02-12 DIAGNOSIS — Z7982 Long term (current) use of aspirin: Secondary | ICD-10-CM | POA: Insufficient documentation

## 2018-02-12 DIAGNOSIS — Z955 Presence of coronary angioplasty implant and graft: Secondary | ICD-10-CM | POA: Diagnosis not present

## 2018-02-12 DIAGNOSIS — E119 Type 2 diabetes mellitus without complications: Secondary | ICD-10-CM | POA: Diagnosis not present

## 2018-02-12 DIAGNOSIS — Z7901 Long term (current) use of anticoagulants: Secondary | ICD-10-CM | POA: Insufficient documentation

## 2018-02-12 DIAGNOSIS — K649 Unspecified hemorrhoids: Secondary | ICD-10-CM | POA: Diagnosis not present

## 2018-02-12 DIAGNOSIS — I2510 Atherosclerotic heart disease of native coronary artery without angina pectoris: Secondary | ICD-10-CM | POA: Insufficient documentation

## 2018-02-12 DIAGNOSIS — K645 Perianal venous thrombosis: Secondary | ICD-10-CM | POA: Insufficient documentation

## 2018-02-12 DIAGNOSIS — I252 Old myocardial infarction: Secondary | ICD-10-CM | POA: Diagnosis not present

## 2018-02-12 DIAGNOSIS — I10 Essential (primary) hypertension: Secondary | ICD-10-CM | POA: Diagnosis not present

## 2018-02-12 DIAGNOSIS — K5900 Constipation, unspecified: Secondary | ICD-10-CM | POA: Insufficient documentation

## 2018-02-12 MED ORDER — BUPIVACAINE HCL (PF) 0.5 % IJ SOLN
30.0000 mL | Freq: Once | INTRAMUSCULAR | Status: AC
  Administered 2018-02-12: 30 mL
  Filled 2018-02-12: qty 30

## 2018-02-12 NOTE — ED Provider Notes (Signed)
[facility] Emergency Department Provider Note  ____________________________________________   None    (approximate) I have reviewed the triage vital signs and the nursing notes.   HISTORY  Chief Complaint No chief complaint on file.   HPI Nathan Hull is a 57 y.o. male who comes to the emergency department with more than 1 month of perianal discomfort.  He has nearly daily hematochezia and has been seen by gastroenterology and diagnosed with both anal fissures and external hemorrhoids.  He has not had a recent colonoscopy.  Tonight his symptoms became acutely worse 1 hour prior to arrival with painful defecation and a large amount of bright red blood in his stool.  He feels generally "fullness" in his rectal area and near his anus.  Pain is worse with defecation and improved when not although he seems to have spasm and cramping constantly.  He is concerned that he might have something "blocked off".  He has not had a recent CT scan.  He has tried topical nitroglycerin with minimal relief.  He is also using stool softeners however he has been unable to perform sitz bath at home.  His symptoms have been constant.    Past Medical History:  Diagnosis Date  . Arthritis   . CAD (coronary artery disease)   . Difficult intubation 09/24/2013   reports told difficult after CABG at [facility].  Glidescope used  . Dyspnea   . Family history of anesthesia complication    mother had N/V after valve replacement  . Hyperlipidemia   . Hypertension   . Myocardial infarct ([place]) 2007  . Obesity   . Wears contact lenses     Patient Active Problem List   Diagnosis Date Noted  . Benign neoplasm of cecum   . Rectal polyp   . Diabetes mellitus without complication ([place]) 41/96/2229  . Extreme obesity 11/14/2014  . Atrial fibrillation ([place]) 09/30/2013  . 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 CAD (coronary artery disease) 09/20/2013  . HLD (hyperlipidemia) 06/19/2006  . Benign essential  HTN 06/19/2006  . Idiopathic peripheral neuropathy 12/30/2005    Past Surgical History:  Procedure Laterality Date  . APPLICATION OF WOUND VAC Right 10/22/2013   Procedure: APPLICATION OF WOUND VAC;  Surgeon: [name], MD;  Location: [place];  Service: Vascular;  Laterality: Right;  . C-Spine disc replacement x 2     06/16/2012  . CARDIAC CATHETERIZATION     last week @ [facility]  . cardiac stents     has been cathed x 4   Had  MI  in 2007  . CARPAL TUNNEL RELEASE     on right  . COLONOSCOPY WITH PROPOFOL N/A 04/26/2016   Procedure: COLONOSCOPY WITH PROPOFOL;  Surgeon: [name], MD;  Location: [place];  Service: Endoscopy;  Laterality: N/A;  . CORONARY ARTERY BYPASS GRAFT N/A 09/24/2013   Procedure: CORONARY ARTERY BYPASS GRAFTING (CABG) x three, using LIMA to LAD, and right leg greater saphenous vein harvested endoscopically - SVG to Ramus, SVG to PL;  Surgeon: [name], MD;  Location: [place];  Service: Open Heart Surgery;  Laterality: N/A;  . I&D EXTREMITY Right 10/22/2013   Procedure: IRRIGATION AND DEBRIDEMENT EXTREMITY-RIGHT LEG;  Surgeon: [name], MD;  Location: [place];  Service: Vascular;  Laterality: Right;  . INTRAOPERATIVE TRANSESOPHAGEAL ECHOCARDIOGRAM N/A 09/24/2013   Procedure: INTRAOPERATIVE TRANSESOPHAGEAL ECHOCARDIOGRAM;  Surgeon: [name], MD;  Location: [place];  Service: Open Heart Surgery;  Laterality: N/A;  . POLYPECTOMY  04/26/2016   Procedure: POLYPECTOMY;  Surgeon: [name], MD;  Location: [place];  Service: Endoscopy;;  . PTCA at [facility]     04/28/2005  . SPINAL FUSION     vertebra, congentital : Dr. Annette Stable   . TONSILLECTOMY      Prior to Admission medications   Medication Sig Start Date End Date Taking? Authorizing Provider  aspirin EC 325 MG EC tablet Take 1 tablet (325 mg total) by mouth daily. 10/26/13   Gold, Wayne E, PA-C  atorvastatin (LIPITOR) 80 MG tablet 1 (ONE) TABLET TABLET, ORAL, DAILY 03/28/17   Carmon Ginsberg, PA  Blood Glucose Monitoring Suppl (ONE TOUCH ULTRA MINI) w/Device KIT USE AS DIRECTED 09/16/16   Carmon Ginsberg, PA  clopidogrel (PLAVIX) 75 MG tablet TAKE 1 TABLET BY MOUTH EVERY DAY 01/19/18   Carmon Ginsberg, PA  dapagliflozin propanediol (FARXIGA) 10 MG TABS tablet Take 10 mg by mouth daily. 01/26/18   Carmon Ginsberg, PA  fluocinonide cream (LIDEX) 0.35 % Apply 1 application topically 3 (three) times daily. To hands 08/20/17   Carmon Ginsberg, PA  hydrocortisone-pramoxine [facility]) 2.5-1 % rectal cream PLACE 1 APPLICATION RECTALLY 3 (THREE) TIMES DAILY. 01/27/18   Carmon Ginsberg, PA  lisinopril (PRINIVIL,ZESTRIL) 20 MG tablet Take 20 mg by mouth daily. 11/26/17   [provider]  metoprolol tartrate (LOPRESSOR) 50 MG tablet 1 TABLET, ORAL, TWO TIMES DAILY 02/13/17   Carmon Ginsberg, PA  ONE TOUCH ULTRA TEST test strip USE AS DIRECTED 10/28/16   Carmon Ginsberg, PA  [facility] DELICA LANCETS FINE MISC See admin instructions. 08/02/16   [provider]  traMADol (ULTRAM) 50 MG tablet TAKE 1 TABLET (50 MG TOTAL) BY MOUTH EVERY 6 (SIX) HOURS AS NEEDED FOR MODERATE PAIN. 02/11/18   Carmon Ginsberg, PA    Allergies Metformin and related  Family History  Problem Relation Age of Onset  . Hypertension Mother   . Rheumatic fever Mother        S/P MVR  . Hypertension Father     Social History Social History   Tobacco Use  . Smoking status: Former Smoker    Packs/day: 2.00    Types: Cigarettes    Last attempt to quit: 04/23/2005    Years since quitting: 12.8  . Smokeless tobacco: Never Used  Substance Use Topics  . Alcohol use: Yes    Alcohol/week: 6.0 standard drinks    Types: 6 Cans of beer per week    Comment: half a 6 pack a month  . Drug use: No    Review of Systems Constitutional: No fever/chills Eyes: No visual changes. ENT: No sore throat. Cardiovascular: Denies chest pain. Respiratory: Denies shortness of breath. Gastrointestinal: Positive for  abdominal pain.  No nausea, no vomiting.  No diarrhea.  Positive for constipation. Genitourinary: Positive for dyschezia Musculoskeletal: Negative for back pain. Skin: Negative for rash. Neurological: Negative for headaches, focal weakness or numbness.   ____________________________________________   PHYSICAL EXAM:  VITAL SIGNS: ED Triage Vitals  Enc Vitals Group     BP      Pulse      Resp      Temp      Temp src      SpO2      Weight      Height      Head Circumference      Peak Flow      Pain Score      Pain Loc  Pain Edu?      Excl. in [place]?     Constitutional: Alert and oriented x4 appears quite uncomfortable pacing around the room Eyes: PERRL EOMI. Head: Atraumatic. Nose: No congestion/rhinnorhea. Mouth/Throat: No trismus Neck: No stridor.   Cardiovascular: Normal rate, regular rhythm. Grossly normal heart sounds.  Good peripheral circulation. Respiratory: Normal respiratory effort.  No retractions. Lungs CTAB and moving good air Gastrointestinal: Soft obese abdomen nontender no peritonitis Rectal exam performed showing thrombosed external hemorrhoid on the left and no fissures visualized. Musculoskeletal: No lower extremity edema   Neurologic:  Normal speech and language. No gross focal neurologic deficits are appreciated. Skin:  Skin is warm, dry and intact. No rash noted. Psychiatric: Mood and affect are normal. Speech and behavior are normal.    ____________________________________________   DIFFERENTIAL includes but not limited to  External hemorrhoid, thrombosed external hemorrhage, internal hemorrhoid, anal fissure, fecal impaction ____________________________________________   LABS (all labs ordered are listed, but only abnormal results are displayed)  Labs Reviewed - No data to display   __________________________________________  EKG   ____________________________________________  RADIOLOGY  CT abdomen pelvis reviewed by me with  no obstruction and no fecal impaction ____________________________________________   PROCEDURES  Procedure(s) performed: Yes  .Nerve Block Date/Time: 02/12/2018 4:25 AM Performed by: Darel Hong, MD Authorized by: Darel Hong, MD   Consent:    Consent obtained:  Verbal   Consent given by:  Patient   Risks discussed:  Intravenous injection, pain, unsuccessful block and bleeding   Alternatives discussed:  Alternative treatment Indications:    Indications:  Pain relief and procedural anesthesia Location:    Nerve block body site: Perianal nerve block. Pre-procedure details:    Skin preparation:  2% chlorhexidine Skin anesthesia (see MAR for exact dosages):    Skin anesthesia method:  Local infiltration   Local anesthetic:  Lidocaine 2% WITH epi Procedure details (see MAR for exact dosages):    Block needle gauge:  25 G   Anesthetic injected:  Lidocaine 2% WITH epi   Steroid injected:  None   Additive injected:  None   Injection procedure:  Anatomic landmarks identified, anatomic landmarks palpated, incremental injection and negative aspiration for blood   Paresthesia:  Immediately resolved Post-procedure details:    Dressing:  None   Outcome:  Pain relieved   Patient tolerance of procedure:  Tolerated well, no immediate complications .Marland KitchenIncision and Drainage Date/Time: 02/12/2018 4:25 AM Performed by: Darel Hong, MD Authorized by: Darel Hong, MD   Consent:    Consent obtained:  Verbal   Consent given by:  Patient   Risks discussed:  Bleeding and incomplete drainage   Alternatives discussed:  Alternative treatment Location:    Type:  External thrombosed hemorrhoid Pre-procedure details:    Skin preparation:  Chloraprep Sedation:    Sedation type:  Anxiolysis Anesthesia (see MAR for exact dosages):    Anesthesia method:  Local infiltration and nerve block   Local anesthetic:  Lidocaine 2% WITH epi Procedure type:    Complexity:  Complex Procedure  details:    Needle aspiration: no     Incision types:  Elliptical   Incision depth:  Submucosal   Scalpel blade:  11   Wound management:  Probed and deloculated, irrigated with saline, extensive cleaning and debrided   Drainage:  Bloody   Drainage amount:  Scant   Wound treatment:  Wound left open   Packing materials:  None Post-procedure details:    Patient tolerance of procedure:  Tolerated well, no  immediate complications Comments:     I performed an elliptical incision over the patient's thrombosed external hemorrhoid after performing perianal nerve block and local infiltration just above the hemorrhoid.  Following the elliptical incision I removed the top of the hemorrhoid and removed a large blood clot.  It was then cleaned up washed out and dressed    Critical Care performed: no  ____________________________________________   INITIAL IMPRESSION / ASSESSMENT AND PLAN / ED COURSE  Pertinent labs & imaging results that were available during my care of the patient were reviewed by me and considered in my medical decision making (see chart for details).   As part of my medical decision making, I reviewed the following data within [place] History obtained from family if available, nursing notes, old chart and ekg, as well as notes from prior ED visits.  Patient comes to the emergency department with 1 month of rectal fullness and hematochezia although with several hours of an acute exacerbation of his symptoms.  He has been compliant with stool softeners as well as multiple topical medications although his symptoms have acutely worsened.  Given the concern for fecal impaction and constipation I obtained a CT scan which fortunately is negative.  On exam he clearly has a thrombosed external hemorrhoid and a verbally consented him for a perianal nerve block and then incision and drainage.  His symptoms are significantly improved thereafter.  I have encouraged him to  begin doing his sitz baths and I will refer him back to his gastroenterologist.  Return to the emergency department sooner for any concerns.      ____________________________________________   FINAL CLINICAL IMPRESSION(S) / ED DIAGNOSES  Final diagnoses:  Thrombosed external hemorrhoid      NEW MEDICATIONS STARTED DURING THIS VISIT:  Discharge Medication List as of 02/12/2018  5:20 AM       Note:  This document was prepared using Dragon voice recognition software and may include unintentional dictation errors.     Darel Hong, MD 02/15/18 [id number]

## 2018-02-12 NOTE — Discharge Instructions (Addendum)
Please wash your bottom with warm water 2-3 times a day for the next 5 days.  These are called sitz baths and are critical for your hemorrhoids.  Fortunately today your CT scan did not show any obstruction.  It was a pleasure to take care of you today, and thank you for coming to our emergency department.  If you have any questions or concerns before leaving please ask the nurse to grab me and I'm more than happy to go through your aftercare instructions again.  If you were prescribed any opioid pain medication today such as Norco, Vicodin, Percocet, morphine, hydrocodone, or oxycodone please make sure you do not drive when you are taking this medication as it can alter your ability to drive safely.  If you have any concerns once you are home that you are not improving or are in fact getting worse before you can make it to your follow-up appointment, please do not hesitate to call 911 and come back for further evaluation.  Darel Hong, MD  Results for orders placed or performed in visit on 02/02/18  POCT urinalysis dipstick  Result Value Ref Range   Color, UA yellow    Clarity, UA     Glucose, UA Positive (A) Negative   Bilirubin, UA Negative    Ketones, UA Negative    Spec Grav, UA 1.015 1.010 - 1.025   Blood, UA Negative    pH, UA 5.0 5.0 - 8.0   Protein, UA Negative Negative   Urobilinogen, UA 0.2 0.2 or 1.0 E.U./dL   Nitrite, UA Negative    Leukocytes, UA Negative Negative   Appearance clear    Odor     Ct Abdomen Pelvis Wo Contrast  Result Date: 02/12/2018 CLINICAL DATA:  Acute onset of rectal pain and constipation. EXAM: CT ABDOMEN AND PELVIS WITHOUT CONTRAST TECHNIQUE: Multidetector CT imaging of the abdomen and pelvis was performed following the standard protocol without IV contrast. COMPARISON:  None. FINDINGS: Lower chest: There is suggestion of a tiny 7 x 3 mm nodular opacity at the right lung base (image 10 of 34). Diffuse coronary artery calcifications are seen. The  patient is status post median sternotomy. Hepatobiliary: The liver is unremarkable in appearance. Small stones are seen within the gallbladder. The gallbladder is otherwise unremarkable. The common bile duct remains normal in caliber. Pancreas: The pancreas is within normal limits. Spleen: The spleen is unremarkable in appearance. Adrenals/Urinary Tract: The adrenal glands are unremarkable in appearance. The kidneys are within normal limits. There is no evidence of hydronephrosis. No renal or ureteral stones are identified. Nonspecific perinephric stranding is noted bilaterally. Stomach/Bowel: The stomach is unremarkable in appearance. The small bowel is within normal limits. The appendix is normal in caliber, without evidence of appendicitis. The colon is unremarkable in appearance. The rectum is grossly unremarkable in appearance. The anorectal canal is unremarkable. A small amount of stool is noted within the colon. Vascular/Lymphatic: Scattered calcification is seen along the abdominal aorta and its branches. The abdominal aorta is otherwise grossly unremarkable. The inferior vena cava is grossly unremarkable. No retroperitoneal lymphadenopathy is seen. No pelvic sidewall lymphadenopathy is identified. Reproductive: The bladder is mildly distended and grossly unremarkable. The prostate is mildly enlarged, measuring 5.1 cm in transverse dimension. Other: No additional soft tissue abnormalities are seen. Musculoskeletal: No acute osseous abnormalities are identified. The visualized musculature is unremarkable in appearance. IMPRESSION: 1. No acute abnormality seen to explain the patient's symptoms. Rectum is grossly unremarkable in appearance. No evidence of  fecal impaction. 2. Mildly enlarged prostate. 3. Suggestion of tiny 7 x 3 mm nodular opacity at the right lung base. No follow-up needed if patient is low-risk. Non-contrast chest CT can be considered in 12 months if patient is high-risk. This recommendation  follows the consensus statement: Guidelines for Management of Incidental Pulmonary Nodules Detected on CT Images: From the Fleischner Society 2017; Radiology 2017; 284:228-243. 4. Diffuse coronary artery calcifications seen. 5. Cholelithiasis.  Gallbladder otherwise unremarkable. Aortic Atherosclerosis (ICD10-I70.0). Electronically Signed   By: Garald Balding M.D.   On: 02/12/2018 05:10

## 2018-02-12 NOTE — ED Notes (Signed)
For previous charting, see paper charting.

## 2018-02-17 ENCOUNTER — Telehealth: Payer: Self-pay | Admitting: Family Medicine

## 2018-02-17 ENCOUNTER — Other Ambulatory Visit: Payer: Self-pay | Admitting: Family Medicine

## 2018-02-17 MED ORDER — METOPROLOL TARTRATE 50 MG PO TABS: ORAL_TABLET | ORAL | 3 refills | Status: DC

## 2018-02-17 NOTE — Telephone Encounter (Signed)
Spoke with pt and he has appt scheduled for 03/04/18, he has been seen in ED and is feeling a little better but still having issues and hemorrhoids and will speak with Dr. Marius Ditch at next appt

## 2018-02-17 NOTE — Telephone Encounter (Signed)
Last filled 02/13/17, please review. KW

## 2018-02-17 NOTE — Telephone Encounter (Signed)
done

## 2018-02-17 NOTE — Telephone Encounter (Signed)
[facility] faxed refill request for the following medications:  metoprolol tartrate (LOPRESSOR) 50 MG tablet  Qty: 180  Date written: 02/13/2017  Date last filled: 11/14/2017  Please advise.

## 2018-02-19 ENCOUNTER — Telehealth: Payer: Self-pay | Admitting: Gastroenterology

## 2018-02-19 ENCOUNTER — Emergency Department
Admission: EM | Admit: 2018-02-19 | Discharge: 2018-02-19 | Disposition: A | Discharge status: Home / Self Care | Payer: BLUE CROSS/BLUE SHIELD | Attending: Emergency Medicine | Admitting: Emergency Medicine

## 2018-02-19 ENCOUNTER — Encounter: Payer: Self-pay | Admitting: Emergency Medicine

## 2018-02-19 ENCOUNTER — Other Ambulatory Visit: Payer: Self-pay | Admitting: Family Medicine

## 2018-02-19 ENCOUNTER — Other Ambulatory Visit: Payer: Self-pay

## 2018-02-19 ENCOUNTER — Telehealth: Payer: Self-pay | Admitting: Family Medicine

## 2018-02-19 DIAGNOSIS — K644 Residual hemorrhoidal skin tags: Secondary | ICD-10-CM | POA: Diagnosis not present

## 2018-02-19 DIAGNOSIS — I2510 Atherosclerotic heart disease of native coronary artery without angina pectoris: Secondary | ICD-10-CM | POA: Diagnosis not present

## 2018-02-19 DIAGNOSIS — Z87891 Personal history of nicotine dependence: Secondary | ICD-10-CM | POA: Insufficient documentation

## 2018-02-19 DIAGNOSIS — K649 Unspecified hemorrhoids: Secondary | ICD-10-CM

## 2018-02-19 DIAGNOSIS — Z79899 Other long term (current) drug therapy: Secondary | ICD-10-CM | POA: Insufficient documentation

## 2018-02-19 DIAGNOSIS — Z7902 Long term (current) use of antithrombotics/antiplatelets: Secondary | ICD-10-CM | POA: Diagnosis not present

## 2018-02-19 DIAGNOSIS — I10 Essential (primary) hypertension: Secondary | ICD-10-CM | POA: Insufficient documentation

## 2018-02-19 DIAGNOSIS — E119 Type 2 diabetes mellitus without complications: Secondary | ICD-10-CM | POA: Diagnosis not present

## 2018-02-19 DIAGNOSIS — Z7982 Long term (current) use of aspirin: Secondary | ICD-10-CM | POA: Insufficient documentation

## 2018-02-19 DIAGNOSIS — Z951 Presence of aortocoronary bypass graft: Secondary | ICD-10-CM | POA: Insufficient documentation

## 2018-02-19 MED ORDER — HYDROCORTISONE ACETATE 25 MG RE SUPP: 25.0000 mg | Freq: Two times a day (BID) | RECTAL | 0 refills | Status: AC | PRN

## 2018-02-19 MED ORDER — OXYCODONE-ACETAMINOPHEN 5-325 MG PO TABS
1.0000 | ORAL_TABLET | Freq: Once | ORAL | Status: AC
  Administered 2018-02-19: 1 via ORAL
  Filled 2018-02-19: qty 1

## 2018-02-19 MED ORDER — HYDROCORTISONE ACETATE 25 MG RE SUPP
25.0000 mg | Freq: Once | RECTAL | Status: AC
  Administered 2018-02-19: 25 mg via RECTAL
  Filled 2018-02-19: qty 1

## 2018-02-19 MED ORDER — LIDOCAINE VISCOUS HCL 2 % MT SOLN
15.0000 mL | Freq: Once | OROMUCOSAL | Status: AC
  Administered 2018-02-19: 15 mL via OROMUCOSAL
  Filled 2018-02-19: qty 15

## 2018-02-19 MED ORDER — OXYCODONE-ACETAMINOPHEN 5-325 MG PO TABS: 1.0000 | ORAL_TABLET | ORAL | 0 refills | Status: DC | PRN

## 2018-02-19 NOTE — Telephone Encounter (Signed)
Discussed short term use of Percocet pending surgical evaluation. Medication sent.

## 2018-02-19 NOTE — Telephone Encounter (Signed)
Pt left vm he went to Er Last night and was told he was supposed to call us. Pt is schedule for 03/04/18 with Dr. Marius Ditch .

## 2018-02-19 NOTE — Telephone Encounter (Signed)
Pt needing to discuss his hemorrhoid issues.  Went to the ER last night and needing to discuss some options.  Please call pt back to advise.  Thanks, [employer]

## 2018-02-19 NOTE — Telephone Encounter (Signed)
Spoke with pt and he currently has an appt with a surgeon tomorrow 02/20/18

## 2018-02-19 NOTE — ED Provider Notes (Signed)
[facility] Emergency Department Provider Note ______________________   First MD Initiated Contact with Patient 02/19/18 0402     (approximate)  I have reviewed the triage vital signs and the nursing notes.   HISTORY  Chief Complaint Hemorrhoids    HPI Nathan Hull is a 57 y.o. male with no loss of chronic medical conditions including recently diagnosed hemorrhoids presents to the emergency department with"hemorrhoidal pain. Patient states that he's had persistent rectal pain over the past week with recurrent 9 out of 10 anal pain. Patient denies any fever. Patient states that he spoke with his gastroenterologist Dr. Marius Ditch who instructed him to go to the emergency department secondary to his discomfort.patient seen emergency department on 02/12/2018 secondary to hemorrhoidal pain. Patient states that he has no prescribed medication for pain at home.   Past Medical History:  Diagnosis Date  . Arthritis   . CAD (coronary artery disease)   . Difficult intubation 09/24/2013   reports told difficult after CABG at [facility].  Glidescope used  . Dyspnea   . Family history of anesthesia complication    mother had N/V after valve replacement  . Hyperlipidemia   . Hypertension   . Myocardial infarct ([name]) 2007  . Obesity   . Wears contact lenses     Patient Active Problem List   Diagnosis Date Noted  . Benign neoplasm of cecum   . Rectal polyp   . Diabetes mellitus without complication ([place]) 32/44/0102  . Extreme obesity 11/14/2014  . Atrial fibrillation ([name]) 09/30/2013  . 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 CAD (coronary artery disease) 09/20/2013  . HLD (hyperlipidemia) 06/19/2006  . Benign essential HTN 06/19/2006  . Idiopathic peripheral neuropathy 12/30/2005    Past Surgical History:  Procedure Laterality Date  . APPLICATION OF WOUND VAC Right 10/22/2013   Procedure: APPLICATION OF WOUND VAC;  Surgeon: [name], MD;  Location: [place];  Service:  Vascular;  Laterality: Right;  . C-Spine disc replacement x 2     06/16/2012  . CARDIAC CATHETERIZATION     last week @ [facility]  . cardiac stents     has been cathed x 4   Had  MI  in 2007  . CARPAL TUNNEL RELEASE     on right  . COLONOSCOPY WITH PROPOFOL N/A 04/26/2016   Procedure: COLONOSCOPY WITH PROPOFOL;  Surgeon: [name], MD;  Location: [place];  Service: Endoscopy;  Laterality: N/A;  . CORONARY ARTERY BYPASS GRAFT N/A 09/24/2013   Procedure: CORONARY ARTERY BYPASS GRAFTING (CABG) x three, using LIMA to LAD, and right leg greater saphenous vein harvested endoscopically - SVG to Ramus, SVG to PL;  Surgeon: [name], MD;  Location: [place];  Service: Open Heart Surgery;  Laterality: N/A;  . I&D EXTREMITY Right 10/22/2013   Procedure: IRRIGATION AND DEBRIDEMENT EXTREMITY-RIGHT LEG;  Surgeon: [name], MD;  Location: [place];  Service: Vascular;  Laterality: Right;  . INTRAOPERATIVE TRANSESOPHAGEAL ECHOCARDIOGRAM N/A 09/24/2013   Procedure: INTRAOPERATIVE TRANSESOPHAGEAL ECHOCARDIOGRAM;  Surgeon: [name], MD;  Location: [place];  Service: Open Heart Surgery;  Laterality: N/A;  . POLYPECTOMY  04/26/2016   Procedure: POLYPECTOMY;  Surgeon: [name], MD;  Location: [place];  Service: Endoscopy;;  . PTCA at [facility]     04/28/2005  . SPINAL FUSION     vertebra, congentital : Dr. Annette Stable   . TONSILLECTOMY      Prior to Admission medications   Medication  Sig Start Date End Date Taking? Authorizing Provider  aspirin EC 325 MG EC tablet Take 1 tablet (325 mg total) by mouth daily. 10/26/13   Gold, Wayne E, PA-C  atorvastatin (LIPITOR) 80 MG tablet 1 (ONE) TABLET TABLET, ORAL, DAILY 03/28/17   Carmon Ginsberg, PA  Blood Glucose Monitoring Suppl (ONE TOUCH ULTRA MINI) w/Device KIT USE AS DIRECTED 09/16/16   Carmon Ginsberg, PA  clopidogrel (PLAVIX) 75 MG tablet TAKE 1 TABLET BY MOUTH EVERY DAY 01/19/18   Carmon Ginsberg, PA  dapagliflozin propanediol (FARXIGA) 10  MG TABS tablet Take 10 mg by mouth daily. 01/26/18   Carmon Ginsberg, PA  fluocinonide cream (LIDEX) 1.61 % Apply 1 application topically 3 (three) times daily. To hands 08/20/17   Carmon Ginsberg, PA  hydrocortisone-pramoxine [facility]) 2.5-1 % rectal cream PLACE 1 APPLICATION RECTALLY 3 (THREE) TIMES DAILY. 01/27/18   Carmon Ginsberg, PA  lisinopril (PRINIVIL,ZESTRIL) 20 MG tablet Take 20 mg by mouth daily. 11/26/17   [provider]  metoprolol tartrate (LOPRESSOR) 50 MG tablet 1 TABLET, ORAL, TWO TIMES DAILY 02/17/18   Carmon Ginsberg, PA  ONE TOUCH ULTRA TEST test strip USE AS DIRECTED 10/28/16   Carmon Ginsberg, PA  [facility] DELICA LANCETS FINE MISC See admin instructions. 08/02/16   [provider]  traMADol (ULTRAM) 50 MG tablet TAKE 1 TABLET (50 MG TOTAL) BY MOUTH EVERY 6 (SIX) HOURS AS NEEDED FOR MODERATE PAIN. 02/11/18   Carmon Ginsberg, PA    Allergies Metformin and related  Family History  Problem Relation Age of Onset  . Hypertension Mother   . Rheumatic fever Mother        S/P MVR  . Hypertension Father     Social History Social History   Tobacco Use  . Smoking status: Former Smoker    Packs/day: 2.00    Types: Cigarettes    Last attempt to quit: 04/23/2005    Years since quitting: 12.8  . Smokeless tobacco: Never Used  Substance Use Topics  . Alcohol use: Yes    Alcohol/week: 6.0 standard drinks    Types: 6 Cans of beer per week    Comment: half a 6 pack a month  . Drug use: No    Review of Systems Constitutional: No fever/chills Eyes: No visual changes. ENT: No sore throat. Cardiovascular: Denies chest pain. Respiratory: Denies shortness of breath. Gastrointestinal: No abdominal pain.  No nausea, no vomiting.  No diarrhea.  No constipation.positive for anal pain Genitourinary: Negative for dysuria. Musculoskeletal: Negative for neck pain.  Negative for back pain. Integumentary: Negative for rash. Neurological: Negative for headaches,  focal weakness or numbness.   ____________________________________________   PHYSICAL EXAM:  VITAL SIGNS: ED Triage Vitals  Enc Vitals Group     BP 02/19/18 0348 (!) 168/88     Pulse Rate 02/19/18 0348 68     Resp 02/19/18 0348 18     Temp 02/19/18 0348 98.1 F (36.7 C)     Temp Source 02/19/18 0348 Oral     SpO2 02/19/18 0348 95 %     Weight 02/19/18 0346 131.5 kg (290 lb)     Height 02/19/18 0346 1.829 m (6')     Head Circumference --      Peak Flow --      Pain Score 02/19/18 0346 8     Pain Loc --      Pain Edu? --      Excl. in [place]? --     Constitutional: Alert and oriented. Well  appearing and in no acute distress. Eyes: Conjunctivae are normal. . Mouth/Throat: Mucous membranes are moist. Oropharynx non-erythematous. Neck: No stridor.   Cardiovascular: Normal rate, regular rhythm. Good peripheral circulation. Grossly normal heart sounds. Respiratory: Normal respiratory effort.  No retractions. Lungs CTAB. Gastrointestinal: Soft and nontender. No distention.  Rectal exam revealed multiple external hemorrhoids  without evidence of thrombosis.  No active bleeding at this time no fissures identified Musculoskeletal: No lower extremity tenderness nor edema. No gross deformities of extremities. Neurologic:  Normal speech and language. No gross focal neurologic deficits are appreciated.  Skin:  Skin is warm, dry and intact. No rash noted. Psychiatric: Mood and affect are normal. Speech and behavior are normal.  ______________  Procedures   ____________________________________________   INITIAL IMPRESSION / ASSESSMENT AND PLAN / ED COURSE  As part of my medical decision making, I reviewed the following data within the electronic MEDICAL RECORD NUMBER  57 year old male presenting with above-stated history and physical exam secondary to hemorrhoidal pain.  Anusol suppository inserted, viscous lidocaine topically applied externally.  Patient given [facility] emergency department  with improvement of pain.  Patient will be prescribed Anusol suppositories for home    ____________________________________________  FINAL CLINICAL IMPRESSION(S) / ED DIAGNOSES  Final diagnoses:  Hemorrhoids, unspecified hemorrhoid type     MEDICATIONS GIVEN DURING THIS VISIT:  Medications  hydrocortisone (ANUSOL-HC) suppository 25 mg (25 mg Rectal Given 02/19/18 0426)  lidocaine (XYLOCAINE) 2 % viscous mouth solution 15 mL (15 mLs Mouth/Throat Given 02/19/18 0426)  oxyCODONE-acetaminophen (PERCOCET/ROXICET) 5-325 MG per tablet 1 tablet (1 tablet Oral Given 02/19/18 0426)     ED Discharge Orders    None       Note:  This document was prepared using Dragon voice recognition software and may include unintentional dictation errors.    Gregor Hams, MD 02/19/18 [phone]

## 2018-02-19 NOTE — ED Triage Notes (Signed)
Patient ambulatory to triage with steady gait, without difficulty or distress noted; pt reports hemorrhoidal pain; seen last wk for same

## 2018-02-19 NOTE — Telephone Encounter (Signed)
Returned call to pt. Advised pt he needs to schedule ER follow-up, which patient did not want to do. Patient states he is seeing a Fish farm manager. Patient wants to know if Mikki Santee will give him something for hemorrhoid pain. Pt states GI office refused to give him anything to help with his hemorrhoid pain. Please advise?

## 2018-02-20 DIAGNOSIS — K601 Chronic anal fissure: Secondary | ICD-10-CM | POA: Diagnosis not present

## 2018-02-20 DIAGNOSIS — K642 Third degree hemorrhoids: Secondary | ICD-10-CM | POA: Diagnosis not present

## 2018-02-25 ENCOUNTER — Encounter: Payer: Self-pay | Admitting: Gastroenterology

## 2018-03-02 ENCOUNTER — Encounter: Payer: Self-pay | Admitting: Gastroenterology

## 2018-03-04 ENCOUNTER — Other Ambulatory Visit: Payer: Self-pay

## 2018-03-04 ENCOUNTER — Ambulatory Visit: Payer: BLUE CROSS/BLUE SHIELD | Admitting: Gastroenterology

## 2018-03-04 ENCOUNTER — Encounter: Payer: Self-pay | Admitting: Gastroenterology

## 2018-03-04 VITALS — BP 114/74 | HR 76 | Resp 16 | Ht 72.0 in | Wt 291.6 lb

## 2018-03-04 DIAGNOSIS — K642 Third degree hemorrhoids: Secondary | ICD-10-CM

## 2018-03-04 NOTE — Progress Notes (Signed)
Nathan Darby, MD [address]  [place]  [place], [place] [address]  Main: [phone]  Fax: [phone]    Gastroenterology Consultation  Referring Provider:     Carmon Ginsberg, [place] Primary Care Physician:  Carmon Ginsberg, [place] Primary Gastroenterologist:  Dr. Cephas Hull Reason for Consultation:     Rectal discomfort, rectal pain, anal fissure        HPI:   Nathan Hull is a 57 y.o. male referred by Dr. Carmon Ginsberg, [place]  for consultation & management of 2 weeks history of severe rectal pain associated with itching, burning and pressure.  He describes the pain as sharp, shooting type and has tried hydrocortisone cream which did not help.  Therefore, he is referred here for further evaluation.  He reports that his stool consistency is variable from firm to lose.  He just started taking stool softener.   His diet is devoid of fiber, reports consuming red meat, fried foods on a regular basis  Follow-up visit 03/04/2018 Since last visit, patient had 2 ER visits.  During the initial visit, he had thrombosed external hemorrhoid that was lanced on 02/12/2018.  Subsequently, due to persistent pain he went to ER again on 02/19/2018.  At that time, he was referred to general surgery, Dr. Lysle Pearl for further management.  He was recommended to continue lidocaine cream topical as well as nitroglycerin cream and witch hazel pads to follow-up with me for possible banding. Patient reports that, for the last 3 to 4 days his pain has been slowly improving, pain is moderate and persists for 4 to 6 hours after bowel movement predominantly itching, burning and discomfort.  He is applying topical nitro per rectum twice daily along with lidocaine.  He stopped using Anusol cream.  He reports having regular bowel movements by taking MiraLAX once a day and fiber supplements daily.  He denies straining or spending prolonged time on toilet.  Patient stopped taking aspirin 325 mg as he has been taking  ibuprofen daily to alleviate rectal discomfort.  He is taking Plavix 75 mg daily for history of coronary disease.   NSAIDs: None  Antiplts/Anticoagulants/Anti thrombotics: On Plavix, aspirin 325 mg for history of coronary artery disease  GI Procedures:  Colonoscopy 04/26/2016 - One 2 mm polyp in the cecum, removed with a cold biopsy forceps. Resected and retrieved. - One 7 mm polyp in the rectum, removed with a cold snare. Resected and retrieved. Clip (MR conditional) was placed. - Non-bleeding internal hemorrhoids.   Past Medical History:  Diagnosis Date  . Arthritis   . CAD (coronary artery disease)   . Difficult intubation 09/24/2013   reports told difficult after CABG at [facility].  Glidescope used  . Dyspnea   . Family history of anesthesia complication    mother had N/V after valve replacement  . Hyperlipidemia   . Hypertension   . Myocardial infarct ([place]) 2007  . Obesity   . Wears contact lenses     Past Surgical History:  Procedure Laterality Date  . APPLICATION OF WOUND VAC Right 10/22/2013   Procedure: APPLICATION OF WOUND VAC;  Surgeon: [name], MD;  Location: [place];  Service: Vascular;  Laterality: Right;  . C-Spine disc replacement x 2     06/16/2012  . CARDIAC CATHETERIZATION     last week @ [facility]  . cardiac stents     has been cathed x 4   Had  MI  in 2007  . CARPAL TUNNEL  RELEASE     on right  . COLONOSCOPY WITH PROPOFOL N/A 04/26/2016   Procedure: COLONOSCOPY WITH PROPOFOL;  Surgeon: [name], MD;  Location: [place];  Service: Endoscopy;  Laterality: N/A;  . CORONARY ARTERY BYPASS GRAFT N/A 09/24/2013   Procedure: CORONARY ARTERY BYPASS GRAFTING (CABG) x three, using LIMA to LAD, and right leg greater saphenous vein harvested endoscopically - SVG to Ramus, SVG to PL;  Surgeon: [name], MD;  Location: [place];  Service: Open Heart Surgery;  Laterality: N/A;  . I&D EXTREMITY Right 10/22/2013   Procedure: IRRIGATION AND DEBRIDEMENT  EXTREMITY-RIGHT LEG;  Surgeon: [name], MD;  Location: [place];  Service: Vascular;  Laterality: Right;  . INTRAOPERATIVE TRANSESOPHAGEAL ECHOCARDIOGRAM N/A 09/24/2013   Procedure: INTRAOPERATIVE TRANSESOPHAGEAL ECHOCARDIOGRAM;  Surgeon: [name], MD;  Location: [place];  Service: Open Heart Surgery;  Laterality: N/A;  . POLYPECTOMY  04/26/2016   Procedure: POLYPECTOMY;  Surgeon: [name], MD;  Location: [place];  Service: Endoscopy;;  . PTCA at [facility]     04/28/2005  . SPINAL FUSION     vertebra, congentital : Dr. Annette Stable   . TONSILLECTOMY      Current Outpatient Medications:  .  aspirin EC 325 MG EC tablet, Take 1 tablet (325 mg total) by mouth daily., Disp: , Rfl:  .  atorvastatin (LIPITOR) 80 MG tablet, 1 (ONE) TABLET TABLET, ORAL, DAILY, Disp: 90 tablet, Rfl: 3 .  Blood Glucose Monitoring Suppl (ONE TOUCH ULTRA MINI) w/Device KIT, USE AS DIRECTED, Disp: 1 each, Rfl: 0 .  clopidogrel (PLAVIX) 75 MG tablet, TAKE 1 TABLET BY MOUTH EVERY DAY, Disp: 90 tablet, Rfl: 3 .  dapagliflozin propanediol (FARXIGA) 10 MG TABS tablet, Take 10 mg by mouth daily., Disp: 90 tablet, Rfl: 1 .  fluocinonide cream (LIDEX) 1.77 %, Apply 1 application topically 3 (three) times daily. To hands, Disp: 30 g, Rfl: 1 .  hydrocortisone-pramoxine (ANALPRAM-HC) 2.5-1 % rectal cream, PLACE 1 APPLICATION RECTALLY 3 (THREE) TIMES DAILY., Disp: 120 g, Rfl: 0 .  lidocaine (XYLOCAINE) 2 % jelly, APPLY TOPICALLY AS NEEDED FOR PAIN (APPLY TO AREA AFTER EVERY FOR BOWEL MOVEMENT ), Disp: , Rfl: 0 .  lisinopril (PRINIVIL,ZESTRIL) 20 MG tablet, Take 20 mg by mouth daily., Disp: , Rfl: 1 .  metoprolol succinate (TOPROL-XL) 100 MG 24 hr tablet, Take 100 mg by mouth daily., Disp: , Rfl: 1 .  ONE TOUCH ULTRA TEST test strip, USE AS DIRECTED, Disp: 100 each, Rfl: 1 .  ONETOUCH DELICA LANCETS FINE MISC, See admin instructions., Disp: , Rfl: 0 .  oxyCODONE-acetaminophen (PERCOCET/ROXICET) 5-325 MG tablet, Take 1  tablet by mouth every 4 (four) hours as needed for severe pain., Disp: 30 tablet, Rfl: 0 .  traMADol (ULTRAM) 50 MG tablet, TAKE 1 TABLET (50 MG TOTAL) BY MOUTH EVERY 6 (SIX) HOURS AS NEEDED FOR MODERATE PAIN., Disp: 120 tablet, Rfl: 0 .  lisinopril (PRINIVIL,ZESTRIL) 10 MG tablet, Take by mouth., Disp: , Rfl:  .  metoprolol tartrate (LOPRESSOR) 50 MG tablet, 1 TABLET, ORAL, TWO TIMES DAILY (Patient not taking: Reported on 03/04/2018), Disp: 180 tablet, Rfl: 3   Family History  Problem Relation Age of Onset  . Hypertension Mother   . Rheumatic fever Mother        S/P MVR  . Hypertension Father      Social History   Tobacco Use  . Smoking status: Former Smoker    Packs/day: 2.00    Types: Cigarettes  Last attempt to quit: 04/23/2005    Years since quitting: 12.8  . Smokeless tobacco: Never Used  Substance Use Topics  . Alcohol use: Yes    Alcohol/week: 6.0 standard drinks    Types: 6 Cans of beer per week    Comment: half a 6 pack a month  . Drug use: No    Allergies as of 03/04/2018 - Review Complete 03/04/2018  Allergen Reaction Noted  . Metformin and related Other (See Comments) 08/19/2016    Review of Systems:    All systems reviewed and negative except where noted in HPI.   Physical Exam:  BP 114/74 (BP Location: Left Arm, Patient Position: Sitting, Cuff Size: Large)   Pulse 76   Resp 16   Ht 6' (1.829 m)   Wt 291 lb 9.6 oz (132.3 kg)   BMI 39.55 kg/m  No LMP for male patient.  General:   Alert,  Well-developed, well-nourished, pleasant and cooperative in NAD Head:  Normocephalic and atraumatic. Eyes:  Sclera clear, no icterus.   Conjunctiva pink. Ears:  Normal auditory acuity. Nose:  No deformity, discharge, or lesions. Mouth:  No deformity or lesions,oropharynx pink & moist. Neck:  Supple; no masses or thyromegaly. Lungs:  Respirations even and unlabored.  Clear throughout to auscultation.   No wheezes, crackles, or rhonchi. No acute distress. Heart:   Regular rate and rhythm; no murmurs, clicks, rubs, or gallops. Abdomen:  Normal bowel sounds. Soft, non-tender and non-distended without masses, hepatosplenomegaly or hernias noted.  No guarding or rebound tenderness.   Rectal: Severe pain in the posterior wall of the anal canal consistent with anal fissure, perianal rash, no lesions seen.  Mild tenderness circumferentially Msk:  Symmetrical without gross deformities. Good, equal movement & strength bilaterally. Pulses:  Normal pulses noted. Extremities:  No clubbing or edema.  No cyanosis. Neurologic:  Alert and oriented x3;  grossly normal neurologically. Skin:  Intact without significant lesions or rashes. No jaundice. Lymph Nodes:  No significant cervical adenopathy. Psych:  Alert and cooperative. Normal mood and affect.  Imaging Studies: None  Assessment and Plan:   AMARI ZAGAL is a 57 y.o. Caucasian male with history of coronary artery disease on DAPT, hypertension presents with 4 weeks history of severe rectal pain/pressure, rectal exam consistent with posterior anal fissure, complicated by thrombosed external hemorrhoid underwent lancing, ongoing hemorrhoid symptoms  Anal fissure: Recommend to continue 0.125% nitroglycerin with lidocaine 3-4 times daily, discussed about necessary precautions and directions provided  Symptomatic hemorrhoids: Discussed with him about outpatient hemorrhoid ligation after healing of the anal fissure.  He will need to be off Plavix for 5 days prior to banding Minimize intake of NSAID use  History of colon polyps based on the colonoscopy 2018 Pathology showed hyperplastic polyp only Surveillance colonoscopy in 04/2026   Follow up in 2 weeks for hemorrhoid ligation   Nathan Darby, MD

## 2018-03-05 ENCOUNTER — Telehealth: Payer: Self-pay | Admitting: Family Medicine

## 2018-03-05 NOTE — Telephone Encounter (Signed)
Schedule Tylenol extra strength 500 mg two pills 3 x day. The narcotic pain mediation will contribute to constipation and make your fissure/hemorrhoids worse. The G.I. Note said to minimize advil or similar but probably ok to take once a day.

## 2018-03-05 NOTE — Telephone Encounter (Signed)
Please review request.  dbs

## 2018-03-05 NOTE — Telephone Encounter (Signed)
GI dr told pt to stop taking  Ibuprofen and percocet  Pt cannot get any help from GI dr for 2 more weeks.   Pt is not healed yet.  Pt needing to discuss what else he can do to help with pain.  Please advise.  Thanks, [employer]

## 2018-03-05 NOTE — Telephone Encounter (Signed)
Spoke to pt about pain medication.  Gave instructions per Mikki Santee on taking tylenol and also once a day advil or similar.  dbs

## 2018-03-11 ENCOUNTER — Other Ambulatory Visit: Payer: Self-pay | Admitting: Family Medicine

## 2018-03-18 ENCOUNTER — Encounter: Payer: Self-pay | Admitting: Gastroenterology

## 2018-03-18 ENCOUNTER — Ambulatory Visit: Payer: BLUE CROSS/BLUE SHIELD | Admitting: Gastroenterology

## 2018-03-18 VITALS — BP 135/82 | HR 68 | Ht 72.0 in | Wt 289.0 lb

## 2018-03-18 DIAGNOSIS — K640 First degree hemorrhoids: Secondary | ICD-10-CM | POA: Diagnosis not present

## 2018-03-18 DIAGNOSIS — K602 Anal fissure, unspecified: Secondary | ICD-10-CM | POA: Diagnosis not present

## 2018-03-18 NOTE — Progress Notes (Signed)
[name], MD [address]  [place]  [place], [place] [address]  Main: [phone]  Fax: [phone]    Gastroenterology Consultation  Referring Provider:     Carmon Ginsberg, [place] Primary Care Physician:  Carmon Ginsberg, [place] Primary Gastroenterologist:  Dr. [name] Reason for Consultation:     Rectal discomfort, rectal pain, anal fissure        HPI:   Nathan Hull is a 57 y.o. male referred by Dr. Carmon Ginsberg, [place]  for consultation & management of 2 weeks history of severe rectal pain associated with itching, burning and pressure.  He describes the pain as sharp, shooting type and has tried hydrocortisone cream which did not help.  Therefore, he is referred here for further evaluation.  He reports that his stool consistency is variable from firm to lose.  He just started taking stool softener.   His diet is devoid of fiber, reports consuming red meat, fried foods on a regular basis  Follow-up visit 03/04/2018 Since last visit, patient had 2 ER visits.  During the initial visit, he had thrombosed external hemorrhoid that was lanced on 02/12/2018.  Subsequently, due to persistent pain he went to ER again on 02/19/2018.  At that time, he was referred to general surgery, Dr. Lysle Pearl for further management.  He was recommended to continue lidocaine cream topical as well as nitroglycerin cream and witch hazel pads to follow-up with me for possible banding. Patient reports that, for the last 3 to 4 days his pain has been slowly improving, pain is moderate and persists for 4 to 6 hours after bowel movement predominantly itching, burning and discomfort.  He is applying topical nitro per rectum twice daily along with lidocaine.  He stopped using Anusol cream.  He reports having regular bowel movements by taking MiraLAX once a day and fiber supplements daily.  He denies straining or spending prolonged time on toilet.  Patient stopped taking aspirin 325 mg as he has been taking  ibuprofen daily to alleviate rectal discomfort.  He is taking Plavix 75 mg daily for history of coronary artery disease.  Follow-up visit 03/18/2018 Report significant improvement in rectal pain compared to what it was but he does not think he is ready for banding yet.  Currently applying nitro up to 4 times a day.  He continues to have burning and itching.   NSAIDs: None  Antiplts/Anticoagulants/Anti thrombotics: On Plavix, aspirin 325 mg for history of coronary artery disease  GI Procedures:  Colonoscopy 04/26/2016 - One 2 mm polyp in the cecum, removed with a cold biopsy forceps. Resected and retrieved. - One 7 mm polyp in the rectum, removed with a cold snare. Resected and retrieved. Clip (MR conditional) was placed. - Non-bleeding internal hemorrhoids.   Past Medical History:  Diagnosis Date  . Arthritis   . CAD (coronary artery disease)   . Difficult intubation 09/24/2013   reports told difficult after CABG at [facility].  Glidescope used  . Dyspnea   . Family history of anesthesia complication    mother had N/V after valve replacement  . Hyperlipidemia   . Hypertension   . Myocardial infarct ([place]) 2007  . Obesity   . Wears contact lenses     Past Surgical History:  Procedure Laterality Date  . APPLICATION OF WOUND VAC Right 10/22/2013   Procedure: APPLICATION OF WOUND VAC;  Surgeon: [name], MD;  Location: [place];  Service: Vascular;  Laterality: Right;  . C-Spine disc  replacement x 2     06/16/2012  . CARDIAC CATHETERIZATION     last week @ [facility]  . cardiac stents     has been cathed x 4   Had  MI  in 2007  . CARPAL TUNNEL RELEASE     on right  . COLONOSCOPY WITH PROPOFOL N/A 04/26/2016   Procedure: COLONOSCOPY WITH PROPOFOL;  Surgeon: [name], MD;  Location: [place];  Service: Endoscopy;  Laterality: N/A;  . CORONARY ARTERY BYPASS GRAFT N/A 09/24/2013   Procedure: CORONARY ARTERY BYPASS GRAFTING (CABG) x three, using LIMA to LAD, and right leg  greater saphenous vein harvested endoscopically - SVG to Ramus, SVG to PL;  Surgeon: [name], MD;  Location: [place];  Service: Open Heart Surgery;  Laterality: N/A;  . I&D EXTREMITY Right 10/22/2013   Procedure: IRRIGATION AND DEBRIDEMENT EXTREMITY-RIGHT LEG;  Surgeon: [name], MD;  Location: [place];  Service: Vascular;  Laterality: Right;  . INTRAOPERATIVE TRANSESOPHAGEAL ECHOCARDIOGRAM N/A 09/24/2013   Procedure: INTRAOPERATIVE TRANSESOPHAGEAL ECHOCARDIOGRAM;  Surgeon: [name], MD;  Location: [place];  Service: Open Heart Surgery;  Laterality: N/A;  . POLYPECTOMY  04/26/2016   Procedure: POLYPECTOMY;  Surgeon: [name], MD;  Location: [place];  Service: Endoscopy;;  . PTCA at [facility]     04/28/2005  . SPINAL FUSION     vertebra, congentital : Dr. Annette Stable   . TONSILLECTOMY      Current Outpatient Medications:  .  aspirin EC 325 MG EC tablet, Take 1 tablet (325 mg total) by mouth daily., Disp: , Rfl:  .  atorvastatin (LIPITOR) 80 MG tablet, TAKE 1 TABLET BY MOUTH EVERY DAY, Disp: 90 tablet, Rfl: 3 .  Blood Glucose Monitoring Suppl (ONE TOUCH ULTRA MINI) w/Device KIT, USE AS DIRECTED, Disp: 1 each, Rfl: 0 .  clopidogrel (PLAVIX) 75 MG tablet, TAKE 1 TABLET BY MOUTH EVERY DAY, Disp: 90 tablet, Rfl: 3 .  dapagliflozin propanediol (FARXIGA) 10 MG TABS tablet, Take 10 mg by mouth daily., Disp: 90 tablet, Rfl: 1 .  fluocinonide cream (LIDEX) 1.51 %, Apply 1 application topically 3 (three) times daily. To hands, Disp: 30 g, Rfl: 1 .  hydrocortisone-pramoxine (ANALPRAM-HC) 2.5-1 % rectal cream, PLACE 1 APPLICATION RECTALLY 3 (THREE) TIMES DAILY., Disp: 120 g, Rfl: 0 .  lidocaine (XYLOCAINE) 2 % jelly, APPLY TOPICALLY AS NEEDED FOR PAIN (APPLY TO AREA AFTER EVERY FOR BOWEL MOVEMENT ), Disp: , Rfl: 0 .  lisinopril (PRINIVIL,ZESTRIL) 10 MG tablet, Take by mouth., Disp: , Rfl:  .  lisinopril (PRINIVIL,ZESTRIL) 20 MG tablet, Take 20 mg by mouth daily., Disp: , Rfl: 1 .   metoprolol succinate (TOPROL-XL) 100 MG 24 hr tablet, Take 100 mg by mouth daily., Disp: , Rfl: 1 .  metoprolol tartrate (LOPRESSOR) 50 MG tablet, 1 TABLET, ORAL, TWO TIMES DAILY, Disp: 180 tablet, Rfl: 3 .  ONE TOUCH ULTRA TEST test strip, USE AS DIRECTED, Disp: 100 each, Rfl: 1 .  ONETOUCH DELICA LANCETS FINE MISC, See admin instructions., Disp: , Rfl: 0 .  oxyCODONE-acetaminophen (PERCOCET/ROXICET) 5-325 MG tablet, Take 1 tablet by mouth every 4 (four) hours as needed for severe pain., Disp: 30 tablet, Rfl: 0 .  traMADol (ULTRAM) 50 MG tablet, TAKE 1 TABLET (50 MG TOTAL) BY MOUTH EVERY 6 (SIX) HOURS AS NEEDED FOR MODERATE PAIN., Disp: 120 tablet, Rfl: 0   Family History  Problem Relation Age of Onset  . Hypertension Mother   . Rheumatic fever Mother  S/P MVR  . Hypertension Father      Social History   Tobacco Use  . Smoking status: Former Smoker    Packs/day: 2.00    Types: Cigarettes    Last attempt to quit: 04/23/2005    Years since quitting: 12.9  . Smokeless tobacco: Never Used  Substance Use Topics  . Alcohol use: Yes    Alcohol/week: 6.0 standard drinks    Types: 6 Cans of beer per week    Comment: half a 6 pack a month  . Drug use: No    Allergies as of 03/18/2018 - Review Complete 03/18/2018  Allergen Reaction Noted  . Metformin and related Other (See Comments) 08/19/2016    Review of Systems:    All systems reviewed and negative except where noted in HPI.   Physical Exam:  BP 135/82   Pulse 68   Ht 6' (1.829 m)   Wt 289 lb (131.1 kg)   BMI 39.20 kg/m  No LMP for male patient.  General:   Alert,  Well-developed, well-nourished, pleasant and cooperative in NAD Head:  Normocephalic and atraumatic. Eyes:  Sclera clear, no icterus.   Conjunctiva pink. Ears:  Normal auditory acuity. Nose:  No deformity, discharge, or lesions. Mouth:  No deformity or lesions,oropharynx pink & moist. Neck:  Supple; no masses or thyromegaly. Lungs:  Respirations even  and unlabored.  Clear throughout to auscultation.   No wheezes, crackles, or rhonchi. No acute distress. Heart:  Regular rate and rhythm; no murmurs, clicks, rubs, or gallops. Abdomen:  Normal bowel sounds. Soft, non-tender and non-distended without masses, hepatosplenomegaly or hernias noted.  No guarding or rebound tenderness.   Rectal: Severe sharp tenderness of the posterior anal fissure, perianal rash, no lesions seen.  Mild tenderness circumferentially Msk:  Symmetrical without gross deformities. Good, equal movement & strength bilaterally. Pulses:  Normal pulses noted. Extremities:  No clubbing or edema.  No cyanosis. Neurologic:  Alert and oriented x3;  grossly normal neurologically. Skin:  Intact without significant lesions or rashes. No jaundice. Lymph Nodes:  No significant cervical adenopathy. Psych:  Alert and cooperative. Normal mood and affect.  Imaging Studies: None  Assessment and Plan:   JENKINS RISDON is a 57 y.o. Caucasian male with history of coronary artery disease on DAPT, hypertension presents with 8 weeks history of severe rectal pain/pressure, rectal exam consistent with posterior anal fissure, complicated by thrombosed external hemorrhoid underwent lancing, ongoing hemorrhoid symptoms  Anal fissure: Recommend to continue 0.125% nitroglycerin with lidocaine 3-4 times daily, discussed about necessary precautions and directions provided  Symptomatic hemorrhoids: Discussed with him about outpatient hemorrhoid ligation after healing of the anal fissure.  He will need to be off Plavix for 5 days prior to banding Minimize intake of NSAID use  History of colon polyps based on the colonoscopy 2018 Pathology showed hyperplastic polyp only Surveillance colonoscopy in 04/2026   Follow up in 6-8 weeks for hemorrhoid ligation   [name], MD

## 2018-03-19 ENCOUNTER — Other Ambulatory Visit: Payer: Self-pay | Admitting: Family Medicine

## 2018-04-20 ENCOUNTER — Other Ambulatory Visit: Payer: Self-pay | Admitting: Family Medicine

## 2018-04-28 ENCOUNTER — Ambulatory Visit: Payer: BLUE CROSS/BLUE SHIELD | Admitting: Family Medicine

## 2018-04-28 ENCOUNTER — Other Ambulatory Visit: Payer: Self-pay

## 2018-04-28 ENCOUNTER — Encounter: Payer: Self-pay | Admitting: Family Medicine

## 2018-04-28 VITALS — BP 118/74 | HR 50 | Temp 97.8°F | Ht 72.0 in | Wt 288.6 lb

## 2018-04-28 DIAGNOSIS — K6289 Other specified diseases of anus and rectum: Secondary | ICD-10-CM | POA: Diagnosis not present

## 2018-04-28 DIAGNOSIS — E119 Type 2 diabetes mellitus without complications: Secondary | ICD-10-CM | POA: Diagnosis not present

## 2018-04-29 DIAGNOSIS — Z951 Presence of aortocoronary bypass graft: Secondary | ICD-10-CM | POA: Diagnosis not present

## 2018-04-29 DIAGNOSIS — Z7902 Long term (current) use of antithrombotics/antiplatelets: Secondary | ICD-10-CM | POA: Diagnosis not present

## 2018-04-29 DIAGNOSIS — Z87891 Personal history of nicotine dependence: Secondary | ICD-10-CM | POA: Diagnosis not present

## 2018-04-29 DIAGNOSIS — Z7982 Long term (current) use of aspirin: Secondary | ICD-10-CM | POA: Diagnosis not present

## 2018-04-29 DIAGNOSIS — K6289 Other specified diseases of anus and rectum: Secondary | ICD-10-CM | POA: Diagnosis not present

## 2018-04-29 LAB — POCT GLYCOSYLATED HEMOGLOBIN (HGB A1C): Hemoglobin A1C: 6.8 % — AB (ref 4.0–5.6)

## 2018-04-29 NOTE — Progress Notes (Signed)
  Subjective:     Patient ID: Nathan Hull, male   DOB: 1960/06/08, 58 y.o.   MRN: [id number] Chief Complaint  Patient presents with  . Diabetes    follow up  . Anal Fissure    for about 5   1/2 months but seeing another provider for it.   HPI States he continues to have rectal pain despite multiple treatments for fissure and hemorrhoids per G.I. and the ER. He has G.I. f/u pending 2/11. I have encouraged him to see if they would see him earlier. He does reports that he is no longer having any rectal bleeding. Takes 2-3 "stool softeners" daily which keeps his stools from loose to formed. States he does no strain any more but has overshot at times with the medication and has soiled himself on a couple of occasions.  Review of Systems  Respiratory: Negative for shortness of breath.   Cardiovascular: Negative for chest pain and palpitations.       Objective:   Physical Exam Constitutional:      General: He is not in acute distress.    Appearance: He is not ill-appearing.  Cardiovascular:     Rate and Rhythm: Regular rhythm. Bradycardia present.  Pulmonary:     Breath sounds: Normal breath sounds.  Genitourinary:    Comments: Two skin tags noted externally-one appears irritated. Musculoskeletal:     Right lower leg: No edema.     Left lower leg: No edema.  Neurological:     Mental Status: He is alert.        Assessment:    1. Diabetes mellitus without complication [facility]): controlled - POCT glycosylated hemoglobin (Hb A1C)  2. Rectal pain    Plan:    Discussed sitz baths. May need biopsy to r/u neoplastic process. F/u in 3 months with provider of his choice.

## 2018-05-01 ENCOUNTER — Other Ambulatory Visit: Payer: Self-pay | Admitting: Family Medicine

## 2018-05-01 ENCOUNTER — Telehealth: Payer: Self-pay

## 2018-05-01 MED ORDER — GLIPIZIDE 10 MG PO TABS: ORAL_TABLET | ORAL | 3 refills | Status: DC

## 2018-05-01 NOTE — Telephone Encounter (Signed)
Patient called office requesting office visit to see you in regards to his current "condition", patient states that he has been seen multiple times in hospital for his problem and as of recent two days ago he reports he was back in ED. Patient stats that he would like to discuss with you his medical condition and what next steps need to be taken at this point. Patient request a call back at [phone]. KW

## 2018-05-01 NOTE — Telephone Encounter (Signed)
Reports swelling in his perineal area which ice/narcotic pain medications will transiently help. I am concerned about possibility that Wilder Glade could be contributing to his sx. Will have him stop Wilder Glade, resume glipizide, complete pain medication provided by the [facility]. And call me Monday. If not improvement will have a surgeon look at him again.

## 2018-05-04 ENCOUNTER — Telehealth: Payer: Self-pay | Admitting: Family Medicine

## 2018-05-04 ENCOUNTER — Ambulatory Visit: Payer: BLUE CROSS/BLUE SHIELD | Admitting: Gastroenterology

## 2018-05-04 ENCOUNTER — Encounter: Payer: Self-pay | Admitting: Gastroenterology

## 2018-05-04 ENCOUNTER — Other Ambulatory Visit: Payer: Self-pay

## 2018-05-04 ENCOUNTER — Other Ambulatory Visit: Payer: Self-pay | Admitting: Family Medicine

## 2018-05-04 VITALS — BP 171/97 | HR 60 | Resp 17 | Ht 72.0 in | Wt 283.6 lb

## 2018-05-04 DIAGNOSIS — K601 Chronic anal fissure: Secondary | ICD-10-CM | POA: Diagnosis not present

## 2018-05-04 DIAGNOSIS — K642 Third degree hemorrhoids: Secondary | ICD-10-CM

## 2018-05-04 DIAGNOSIS — Z125 Encounter for screening for malignant neoplasm of prostate: Secondary | ICD-10-CM

## 2018-05-04 MED ORDER — OXYCODONE-ACETAMINOPHEN 10-325 MG PO TABS: 1.0000 | ORAL_TABLET | Freq: Four times a day (QID) | ORAL | 0 refills | Status: DC | PRN

## 2018-05-04 NOTE — Telephone Encounter (Signed)
Please review

## 2018-05-04 NOTE — Progress Notes (Signed)
Nathan Darby, MD [address]  [place]  [place], [place] [address]  Main: [phone]  Fax: [phone]    Gastroenterology Consultation  Referring Provider:     Carmon Ginsberg, [place] Primary Care Physician:  Carmon Ginsberg, [place] Primary Gastroenterologist:  Dr. Allen Norris Reason for Consultation:     Rectal discomfort, rectal pain, anal fissure        HPI:   Nathan Hull is a 58 y.o. male referred by Dr. Carmon Ginsberg, [place]  for consultation & management of 2 weeks history of severe rectal pain associated with itching, burning and pressure.  He describes the pain as sharp, shooting type and has tried hydrocortisone cream which did not help.  Therefore, he is referred here for further evaluation.  He reports that his stool consistency is variable from firm to lose.  He just started taking stool softener.   His diet is devoid of fiber, reports consuming red meat, fried foods on a regular basis  Follow-up visit 03/04/2018 Since last visit, patient had 2 ER visits.  During the initial visit, he had thrombosed external hemorrhoid that was lanced on 02/12/2018.  Subsequently, due to persistent pain he went to ER again on 02/19/2018.  At that time, he was referred to general surgery, Dr. Lysle Pearl for further management.  He was recommended to continue lidocaine cream topical as well as nitroglycerin cream and witch hazel pads to follow-up with me for possible banding. Patient reports that, for the last 3 to 4 days his pain has been slowly improving, pain is moderate and persists for 4 to 6 hours after bowel movement predominantly itching, burning and discomfort.  He is applying topical nitro per rectum twice daily along with lidocaine.  He stopped using Anusol cream.  He reports having regular bowel movements by taking MiraLAX once a day and fiber supplements daily.  He denies straining or spending prolonged time on toilet.  Patient stopped taking aspirin 325 mg as he has been taking ibuprofen  daily to alleviate rectal discomfort.  He is taking Plavix 75 mg daily for history of coronary artery disease.  Follow-up visit 03/18/2018 Report significant improvement in rectal pain compared to what it was but he does not think he is ready for banding yet.  Currently applying nitro up to 4 times a day.  He continues to have burning and itching.  Follow-up visit 05/04/2018 Patient went to [facility] ER last week due to significant anorectal pain.  He was discharged home on Percocet.  Dulce Sellar was discontinued by his primary care provider due to ongoing anorectal pain as there is less than 1% risk for necrotizing fasciitis of the perineum.  He has been using Nitropaste for the last 3 months with no relief.  Patient is tearful in my office today due to significant pain   NSAIDs: None  Antiplts/Anticoagulants/Anti thrombotics: On Plavix, aspirin 325 mg for history of coronary artery disease  GI Procedures:  Colonoscopy 04/26/2016 by Dr Allen Norris - One 2 mm polyp in the cecum, removed with a cold biopsy forceps. Resected and retrieved. - One 7 mm polyp in the rectum, removed with a cold snare. Resected and retrieved. Clip (MR conditional) was placed. - Non-bleeding internal hemorrhoids.   Past Medical History:  Diagnosis Date  . Arthritis   . CAD (coronary artery disease)   . Difficult intubation 09/24/2013   reports told difficult after CABG at [facility]  Glidescope used  . Dyspnea   . Family history of anesthesia complication  mother had N/V after valve replacement  . Hyperlipidemia   . Hypertension   . Myocardial infarct ([place]) 2007  . Obesity   . Wears contact lenses     Past Surgical History:  Procedure Laterality Date  . APPLICATION OF WOUND VAC Right 10/22/2013   Procedure: APPLICATION OF WOUND VAC;  Surgeon: [name], MD;  Location: [place];  Service: Vascular;  Laterality: Right;  . C-Spine disc replacement x 2     06/16/2012  . CARDIAC CATHETERIZATION     last week @ [facility]  .  cardiac stents     has been cathed x 4   Had  MI  in 2007  . CARPAL TUNNEL RELEASE     on right  . COLONOSCOPY WITH PROPOFOL N/A 04/26/2016   Procedure: COLONOSCOPY WITH PROPOFOL;  Surgeon: [name], MD;  Location: [place];  Service: Endoscopy;  Laterality: N/A;  . CORONARY ARTERY BYPASS GRAFT N/A 09/24/2013   Procedure: CORONARY ARTERY BYPASS GRAFTING (CABG) x three, using LIMA to LAD, and right leg greater saphenous vein harvested endoscopically - SVG to Ramus, SVG to PL;  Surgeon: [name], MD;  Location: [place];  Service: Open Heart Surgery;  Laterality: N/A;  . I&D EXTREMITY Right 10/22/2013   Procedure: IRRIGATION AND DEBRIDEMENT EXTREMITY-RIGHT LEG;  Surgeon: [name], MD;  Location: [place];  Service: Vascular;  Laterality: Right;  . INTRAOPERATIVE TRANSESOPHAGEAL ECHOCARDIOGRAM N/A 09/24/2013   Procedure: INTRAOPERATIVE TRANSESOPHAGEAL ECHOCARDIOGRAM;  Surgeon: [name], MD;  Location: [place];  Service: Open Heart Surgery;  Laterality: N/A;  . POLYPECTOMY  04/26/2016   Procedure: POLYPECTOMY;  Surgeon: [name], MD;  Location: [place];  Service: Endoscopy;;  . PTCA at [facility]     04/28/2005  . SPINAL FUSION     vertebra, congentital : Dr. Annette Stable   . TONSILLECTOMY      Current Outpatient Medications:  .  aspirin EC 325 MG EC tablet, Take 1 tablet (325 mg total) by mouth daily., Disp: , Rfl:  .  atorvastatin (LIPITOR) 80 MG tablet, TAKE 1 TABLET BY MOUTH EVERY DAY, Disp: 90 tablet, Rfl: 3 .  Blood Glucose Monitoring Suppl (ONE TOUCH ULTRA MINI) w/Device KIT, USE AS DIRECTED, Disp: 1 each, Rfl: 0 .  clopidogrel (PLAVIX) 75 MG tablet, TAKE 1 TABLET BY MOUTH EVERY DAY, Disp: 90 tablet, Rfl: 3 .  glipiZIDE (GLUCOTROL) 10 MG tablet, Take one 30 minutes before the two biggest meals of the day., Disp: 60 tablet, Rfl: 3 .  lisinopril (PRINIVIL,ZESTRIL) 20 MG tablet, Take 20 mg by mouth daily., Disp: , Rfl: 1 .  metoprolol succinate (TOPROL-XL) 100 MG  24 hr tablet, Take 100 mg by mouth daily., Disp: , Rfl: 1 .  ONE TOUCH ULTRA TEST test strip, USE AS DIRECTED, Disp: 100 each, Rfl: 1 .  ONETOUCH DELICA LANCETS FINE MISC, See admin instructions., Disp: , Rfl: 0 .  traMADol (ULTRAM) 50 MG tablet, TAKE 1 TABLET (50 MG TOTAL) BY MOUTH EVERY 6 (SIX) HOURS AS NEEDED FOR MODERATE PAIN., Disp: 120 tablet, Rfl: 0 .  dapagliflozin propanediol (FARXIGA) 10 MG TABS tablet, Take 10 mg by mouth daily. (Patient not taking: Reported on 05/04/2018), Disp: 90 tablet, Rfl: 1 .  fluocinonide cream (LIDEX) 5.46 %, Apply 1 application topically 3 (three) times daily. To hands (Patient not taking: Reported on 05/04/2018), Disp: 30 g, Rfl: 1 .  HYDROcodone-acetaminophen (NORCO) 7.5-325 MG tablet, , Disp: , Rfl:  .  hydrocortisone-pramoxine (  ANALPRAM-HC) 2.5-1 % rectal cream, PLACE 1 APPLICATION RECTALLY 3 (THREE) TIMES DAILY. (Patient not taking: Reported on 05/04/2018), Disp: 120 g, Rfl: 0 .  lidocaine (XYLOCAINE) 2 % jelly, APPLY TOPICALLY AS NEEDED FOR PAIN (APPLY TO AREA AFTER EVERY FOR BOWEL MOVEMENT ), Disp: , Rfl: 0 .  lisinopril (PRINIVIL,ZESTRIL) 10 MG tablet, Take by mouth., Disp: , Rfl:  .  metoprolol tartrate (LOPRESSOR) 50 MG tablet, 1 TABLET, ORAL, TWO TIMES DAILY (Patient not taking: Reported on 05/04/2018), Disp: 180 tablet, Rfl: 3 .  NITRO-BID 2 % ointment, , Disp: , Rfl:    Family History  Problem Relation Age of Onset  . Hypertension Mother   . Rheumatic fever Mother        S/P MVR  . Hypertension Father      Social History   Tobacco Use  . Smoking status: Former Smoker    Packs/day: 2.00    Types: Cigarettes    Last attempt to quit: 04/23/2005    Years since quitting: 13.0  . Smokeless tobacco: Never Used  Substance Use Topics  . Alcohol use: Yes    Alcohol/week: 6.0 standard drinks    Types: 6 Cans of beer per week    Comment: half a 6 pack a month  . Drug use: No    Allergies as of 05/04/2018 - Review Complete 05/04/2018  Allergen  Reaction Noted  . Metformin and related Other (See Comments) 08/19/2016    Review of Systems:    All systems reviewed and negative except where noted in HPI.   Physical Exam:  BP (!) 171/97 (BP Location: Left Arm, Patient Position: Sitting, Cuff Size: Large)   Pulse 60   Resp 17   Ht 6' (1.829 m)   Wt 283 lb 9.6 oz (128.6 kg)   BMI 38.46 kg/m  No LMP for male patient.  General:   Alert,  Well-developed, well-nourished, pleasant and cooperative in NAD Head:  Normocephalic and atraumatic. Eyes:  Sclera clear, no icterus.   Conjunctiva pink. Ears:  Normal auditory acuity. Nose:  No deformity, discharge, or lesions. Mouth:  No deformity or lesions,oropharynx pink & moist. Neck:  Supple; no masses or thyromegaly. Lungs:  Respirations even and unlabored.  Clear throughout to auscultation.   No wheezes, crackles, or rhonchi. No acute distress. Heart:  Regular rate and rhythm; no murmurs, clicks, rubs, or gallops. Abdomen:  Normal bowel sounds. Soft, non-tender and non-distended without masses, hepatosplenomegaly or hernias noted.  No guarding or rebound tenderness.   Rectal: Not performed today Msk:  Symmetrical without gross deformities. Good, equal movement & strength bilaterally. Pulses:  Normal pulses noted. Extremities:  No clubbing or edema.  No cyanosis. Neurologic:  Alert and oriented x3;  grossly normal neurologically. Skin:  Intact without significant lesions or rashes. No jaundice. Psych:  Alert and cooperative. Normal mood and affect.  Imaging Studies: None  Assessment and Plan:   RAMONTE MENA is a 58 y.o. Caucasian male with history of coronary artery disease on DAPT, hypertension presents with 8 weeks history of severe rectal pain/pressure, rectal exam consistent with posterior anal fissure, complicated by thrombosed external hemorrhoid underwent lancing, ongoing hemorrhoid symptoms  Chronic anal fissure, severe anorectal pain: Switch to nifedipine 0.5% with  lidocaine Messaged Dr. Lysle Pearl on epic for rigid proctoscopy and EUA Patient will probably benefit from hemorrhoidectomy and reapir of anal fissure due to ongoing symptoms despite trial of 0.125% Nitropaste for 3 months  Symptomatic hemorrhoids: Defer hemorrhoid ligation at this time To  be seen by surgery  History of colon polyps based on the colonoscopy 2018 Pathology showed hyperplastic polyp only Surveillance colonoscopy in 04/2026   Follow up in 2-3 weeks   Nathan Darby, MD

## 2018-05-04 NOTE — Telephone Encounter (Signed)
Pt got the moved to today at 1 w/ Dr. Marius Ditch.  Pain medication is gone for ER visit.  Please call pt if needed.  Thanks, [employer]

## 2018-05-05 LAB — PSA: Prostate Specific Ag, Serum: 0.7 ng/mL (ref 0.0–4.0)

## 2018-05-08 ENCOUNTER — Other Ambulatory Visit: Payer: Self-pay | Admitting: Family Medicine

## 2018-05-08 DIAGNOSIS — K644 Residual hemorrhoidal skin tags: Secondary | ICD-10-CM | POA: Diagnosis not present

## 2018-05-11 ENCOUNTER — Ambulatory Visit: Payer: Self-pay | Admitting: Surgery

## 2018-05-11 NOTE — H&P (Signed)
Subjective:  CC: Residual hemorrhoidal skin tags [K64.4]   HPI:  Nathan Hull is a 58 y.o. male who was referrred by Marius Ditch for above. Symptoms were first noted a few months ago. he had some rectal bleeding occurring several times per week that has now resolved.   Pain is still constant with frequent exacerbations after BMs., confined to the perirectal area, without radiation.  Associated with no other symptoms now, exacerbated by BMs.  Also complains of feeling of fullness and itching in the area.  He has trouble sitting at times during the exacerbations.  Trial of nitrogylcerin cream has not helped so referred to [place] for further management.  Toilet habits: Patient has been dealing with diarrhea in between periods of constipation.  He states he is taking stool softeners and a high-fiber high water intake on a daily basis.   Past Medical History:  has a past medical history of Diabetes mellitus without complication (CMS-HCC), History of myocardial infarction, HLD (hyperlipidemia), and Hypertension.  Past Surgical History:  has a past surgical history that includes Coronary artery bypass graft; Endoscopic Carpal Tunnel Release (Right); and Anterior fusion cervical spine.  Family History: reviewed and not relevant to CC  Social History:  reports that he has quit smoking. He has never used smokeless tobacco. He reports previous alcohol use. He reports that he does not use drugs.  Current Medications: has a current medication list which includes the following prescription(s): aspirin, atorvastatin, blood glucose diagnostic, blood glucose meter, clopidogrel, glipizide, lancets, lidocaine, lisinopril, metoprolol succinate, oxycodone-acetaminophen, tramadol, and dapagliflozin.  Allergies:       Allergies as of 05/08/2018 - Reviewed 05/08/2018  Allergen Reaction Noted  . Metformin Other (See Comments) 08/19/2016    ROS:  A 15 point review of systems was performed and pertinent  positives and negatives noted in HPI  Objective:   BP 128/70   Pulse 76   Ht 182.9 cm (6')   Wt (!) 129.3 kg (285 lb 0.9 oz)   BMI 38.66 kg/m   Constitutional :  alert, appears stated age, cooperative and no distress  Lymphatics/Throat::  no asymmetry, masses, or scars  Respiratory:  clear to auscultation bilaterally  Cardiovascular:  regular rate and rhythm  Gastrointestinal: soft, non-tender; bowel sounds normal; no masses,  no organomegaly.    Musculoskeletal: Steady gait and movement  Skin: Cool and moist  Psychiatric: Normal affect, non-agitated, not confused  Genital/Rectal: External exam noted small skin tags in lateral and posterior aspect, tender to palpation. Still cannot visualize a fissure.  Tenderness is along the sphincter muscle circumferentially improved as well.  No evidence of thrombosis at this time.    LABS:  n/a   RADS: N/a  Assessment:  Anal fissure Residual hemorrhoidal skin tags [K64.4]   Hx of MI, now on plavix Plan:  1. Residual hemorrhoidal skin tags [K64.4] Discussed risks/benefits/alternatives to surgery.  Alternatives include the options of observation, medical management.  Benefits include symptomatic relief.  I discussed  in detail and the complications related to the operation and the anesthesia, including bleeding, infection, recurrence, remote possibility of temporary or permanent fecal incontinence, poor/delayed wound healing, chronic pain, and additional procedures to address said risks. The risks of general anesthetic, if used, includes MI, CVA, sudden death or even reaction to anesthetic medications also discussed.   We also discussed typical post operative recovery which includes weeks to potentially months of anal pain, drainage, occasional bleeding, and sense of fecal urgency.    ED return precautions given  for sudden increase in pain, bleeding, with possible accompanying fever, nausea, and/or vomiting.  With chronicity of pain  requiring frequent narcotic use, focal pain around the skin tags, and no significant improvement in symptoms despite the nitro cream by GI, I recommended a formal EUA, skin tag removal, possible botox injection if a fissure is found, and possible internal hemorrhoidectomy.  The patient understands the risks, any and all questions were answered to the patient's satisfaction.    Hx of MI- increased cardiovascular risk for the surgery but benefits outweigh risk at this time.  Will proceed after obtaining approval to hold plavix for couple days prior and after procedure date from primary.  Aspirin will be held as well and I recommended a CLD perioperatively to minimize hard BMs postop

## 2018-05-11 NOTE — H&P (View-Only) (Signed)
Subjective:  CC: Residual hemorrhoidal skin tags [K64.4]   HPI:  Nathan Hull is a 58 y.o. male who was referrred by Marius Ditch for above. Symptoms were first noted a few months ago. he had some rectal bleeding occurring several times per week that has now resolved.   Pain is still constant with frequent exacerbations after BMs., confined to the perirectal area, without radiation.  Associated with no other symptoms now, exacerbated by BMs.  Also complains of feeling of fullness and itching in the area.  He has trouble sitting at times during the exacerbations.  Trial of nitrogylcerin cream has not helped so referred to [place] for further management.  Toilet habits: Patient has been dealing with diarrhea in between periods of constipation.  He states he is taking stool softeners and a high-fiber high water intake on a daily basis.   Past Medical History:  has a past medical history of Diabetes mellitus without complication (CMS-HCC), History of myocardial infarction, HLD (hyperlipidemia), and Hypertension.  Past Surgical History:  has a past surgical history that includes Coronary artery bypass graft; Endoscopic Carpal Tunnel Release (Right); and Anterior fusion cervical spine.  Family History: reviewed and not relevant to CC  Social History:  reports that he has quit smoking. He has never used smokeless tobacco. He reports previous alcohol use. He reports that he does not use drugs.  Current Medications: has a current medication list which includes the following prescription(s): aspirin, atorvastatin, blood glucose diagnostic, blood glucose meter, clopidogrel, glipizide, lancets, lidocaine, lisinopril, metoprolol succinate, oxycodone-acetaminophen, tramadol, and dapagliflozin.  Allergies:       Allergies as of 05/08/2018 - Reviewed 05/08/2018  Allergen Reaction Noted  . Metformin Other (See Comments) 08/19/2016    ROS:  A 15 point review of systems was performed and pertinent  positives and negatives noted in HPI  Objective:   BP 128/70   Pulse 76   Ht 182.9 cm (6')   Wt (!) 129.3 kg (285 lb 0.9 oz)   BMI 38.66 kg/m   Constitutional :  alert, appears stated age, cooperative and no distress  Lymphatics/Throat::  no asymmetry, masses, or scars  Respiratory:  clear to auscultation bilaterally  Cardiovascular:  regular rate and rhythm  Gastrointestinal: soft, non-tender; bowel sounds normal; no masses,  no organomegaly.    Musculoskeletal: Steady gait and movement  Skin: Cool and moist  Psychiatric: Normal affect, non-agitated, not confused  Genital/Rectal: External exam noted small skin tags in lateral and posterior aspect, tender to palpation. Still cannot visualize a fissure.  Tenderness is along the sphincter muscle circumferentially improved as well.  No evidence of thrombosis at this time.    LABS:  n/a   RADS: N/a  Assessment:  Anal fissure Residual hemorrhoidal skin tags [K64.4]   Hx of MI, now on plavix Plan:  1. Residual hemorrhoidal skin tags [K64.4] Discussed risks/benefits/alternatives to surgery.  Alternatives include the options of observation, medical management.  Benefits include symptomatic relief.  I discussed  in detail and the complications related to the operation and the anesthesia, including bleeding, infection, recurrence, remote possibility of temporary or permanent fecal incontinence, poor/delayed wound healing, chronic pain, and additional procedures to address said risks. The risks of general anesthetic, if used, includes MI, CVA, sudden death or even reaction to anesthetic medications also discussed.   We also discussed typical post operative recovery which includes weeks to potentially months of anal pain, drainage, occasional bleeding, and sense of fecal urgency.    ED return precautions given  for sudden increase in pain, bleeding, with possible accompanying fever, nausea, and/or vomiting.  With chronicity of pain  requiring frequent narcotic use, focal pain around the skin tags, and no significant improvement in symptoms despite the nitro cream by GI, I recommended a formal EUA, skin tag removal, possible botox injection if a fissure is found, and possible internal hemorrhoidectomy.  The patient understands the risks, any and all questions were answered to the patient's satisfaction.    Hx of MI- increased cardiovascular risk for the surgery but benefits outweigh risk at this time.  Will proceed after obtaining approval to hold plavix for couple days prior and after procedure date from primary.  Aspirin will be held as well and I recommended a CLD perioperatively to minimize hard BMs postop

## 2018-05-12 ENCOUNTER — Other Ambulatory Visit: Payer: Self-pay

## 2018-05-12 ENCOUNTER — Encounter
Admission: RE | Admit: 2018-05-12 | Discharge: 2018-05-12 | Disposition: A | Discharge status: Home / Self Care | Payer: BLUE CROSS/BLUE SHIELD | Source: Ambulatory Visit | Attending: Surgery | Admitting: Surgery

## 2018-05-12 ENCOUNTER — Other Ambulatory Visit: Payer: Self-pay | Admitting: Family Medicine

## 2018-05-12 ENCOUNTER — Ambulatory Visit: Payer: BLUE CROSS/BLUE SHIELD | Admitting: Gastroenterology

## 2018-05-12 DIAGNOSIS — I2510 Atherosclerotic heart disease of native coronary artery without angina pectoris: Secondary | ICD-10-CM | POA: Diagnosis not present

## 2018-05-12 DIAGNOSIS — I4510 Unspecified right bundle-branch block: Secondary | ICD-10-CM | POA: Diagnosis not present

## 2018-05-12 DIAGNOSIS — I10 Essential (primary) hypertension: Secondary | ICD-10-CM | POA: Insufficient documentation

## 2018-05-12 DIAGNOSIS — R001 Bradycardia, unspecified: Secondary | ICD-10-CM | POA: Diagnosis not present

## 2018-05-12 DIAGNOSIS — Z01818 Encounter for other preprocedural examination: Secondary | ICD-10-CM | POA: Insufficient documentation

## 2018-05-12 DIAGNOSIS — R9431 Abnormal electrocardiogram [ECG] [EKG]: Secondary | ICD-10-CM | POA: Insufficient documentation

## 2018-05-12 HISTORY — DX: Personal history of urinary calculi: Z87.442

## 2018-05-12 HISTORY — DX: Type 2 diabetes mellitus without complications: E11.9

## 2018-05-12 LAB — CBC
HEMATOCRIT: 41.4 % (ref 39.0–52.0)
HEMOGLOBIN: 13.4 g/dL (ref 13.0–17.0)
MCH: 28.9 pg (ref 26.0–34.0)
MCHC: 32.4 g/dL (ref 30.0–36.0)
MCV: 89.2 fL (ref 80.0–100.0)
NRBC: 0 % (ref 0.0–0.2)
Platelets: 262 10*3/uL (ref 150–400)
RBC: 4.64 MIL/uL (ref 4.22–5.81)
RDW: 12.2 % (ref 11.5–15.5)
WBC: 6.7 10*3/uL (ref 4.0–10.5)

## 2018-05-12 LAB — BASIC METABOLIC PANEL
Anion gap: 6 (ref 5–15)
BUN: 19 mg/dL (ref 6–20)
CALCIUM: 9 mg/dL (ref 8.9–10.3)
CHLORIDE: 103 mmol/L (ref 98–111)
CO2: 29 mmol/L (ref 22–32)
Creatinine, Ser: 0.91 mg/dL (ref 0.61–1.24)
GFR calc Af Amer: >60 mL/min (ref >60)
GFR calc non Af Amer: >60 mL/min (ref >60)
Glucose, Bld: 115 mg/dL — ABNORMAL HIGH (ref 70–99)
Potassium: 4.1 mmol/L (ref 3.5–5.1)
Sodium: 138 mmol/L (ref 135–145)

## 2018-05-12 MED ORDER — OXYCODONE-ACETAMINOPHEN 10-325 MG PO TABS: 1.0000 | ORAL_TABLET | Freq: Four times a day (QID) | ORAL | 0 refills | Status: DC | PRN

## 2018-05-12 NOTE — Patient Instructions (Addendum)
Your procedure is scheduled on: Thurs. 2/13 Report to Day Surgery. To find out your arrival time please call [phone] between 1PM - 3PM on Wed. 2/12.  Remember: Instructions that are not followed completely may result in serious medical risk,  up to and including death, or upon the discretion of your surgeon and anesthesiologist your  surgery may need to be rescheduled.     _X__ 1. Do not eat food after midnight the night before your procedure.                 No gum chewing or hard candies. You may drink clear liquids up to 2 hours                 before you are scheduled to arrive for your surgery- DO not drink clear                 liquids within 2 hours of the start of your surgery.                 Clear Liquids include:  water,  __X__2.  On the morning of surgery brush your teeth with toothpaste and water, you                may rinse your mouth with mouthwash if you wish.  Do not swallow any toothpaste of mouthwash.     _X__ 3.  No Alcohol for 24 hours before or after surgery.   ___ 4.  Do Not Smoke or use e-cigarettes For 24 Hours Prior to Your Surgery.                 Do not use any chewable tobacco products for at least 6 hours prior to                 surgery.  ____  5.  Bring all medications with you on the day of surgery if instructed.   __x__  6.  Notify your doctor if there is any change in your medical condition      (cold, fever, infections).     Do not wear jewelry, make-up, hairpins, clips or nail polish. Do not wear lotions, powders, or perfumes. You may wear deodorant. Do not shave 48 hours prior to surgery. Men may shave face and neck. Do not bring valuables to the hospital.    [facility] is not responsible for any belongings or valuables.  Contacts, dentures or bridgework may not be worn into surgery. Leave your suitcase in the car. After surgery it may be brought to your room. For patients admitted to the hospital, discharge  time is determined by your treatment team.   Patients discharged the day of surgery will not be allowed to drive home.   Please read over the following fact sheets that you were given:     __x__ Take these medicines the morning of surgery with A SIP OF WATER:    1. atorvastatin (LIPITOR) 80 MG tablet  2. metoprolol succinate (TOPROL-XL) 100 MG 24 hr tablet  3. oxyCODONE-acetaminophen (PERCOCET) 10-325 MG tablet if needed  4.  5.  6.  ____ Fleet Enema (as directed)   ____ Use CHG Soap as directed  ____ Use inhalers on the day of surgery  ____ Stop metformin 2 days prior to surgery    ____ Take 1/2 of usual insulin dose the night before surgery. No insulin the morning          of  surgery.   __x__ Alona Bene on Friday 2/7 /aspirin on 2/7  __x__ Stop Anti-inflammatories on today   ____ Stop supplements until after surgery.    ____ Bring C-Pap to the hospital.   Follow Dr. Ines Bloomer instructions on clear liquid diet

## 2018-05-12 NOTE — Telephone Encounter (Signed)
Pt called saying he did the preop today for surgery on Thursday the 13th and he needs a refill on pain med Oxycodone 10-325  [facility]  CB#  [phone]  Thanks Con Memos

## 2018-05-12 NOTE — Telephone Encounter (Signed)
Please review. KW 

## 2018-05-14 ENCOUNTER — Ambulatory Visit
Admission: RE | Admit: 2018-05-14 | Discharge: 2018-05-14 | Disposition: A | Discharge status: Home / Self Care | Payer: BLUE CROSS/BLUE SHIELD | Attending: Surgery | Admitting: Surgery

## 2018-05-14 ENCOUNTER — Other Ambulatory Visit: Payer: Self-pay

## 2018-05-14 ENCOUNTER — Encounter: Payer: Self-pay | Admitting: *Deleted

## 2018-05-14 ENCOUNTER — Ambulatory Visit: Payer: BLUE CROSS/BLUE SHIELD | Admitting: Anesthesiology

## 2018-05-14 ENCOUNTER — Encounter
Admission: RE | Disposition: A | Discharge status: Home / Self Care | Payer: Self-pay | Source: Home / Self Care | Attending: Surgery

## 2018-05-14 DIAGNOSIS — Z87891 Personal history of nicotine dependence: Secondary | ICD-10-CM | POA: Insufficient documentation

## 2018-05-14 DIAGNOSIS — K648 Other hemorrhoids: Secondary | ICD-10-CM | POA: Diagnosis not present

## 2018-05-14 DIAGNOSIS — K649 Unspecified hemorrhoids: Secondary | ICD-10-CM | POA: Diagnosis not present

## 2018-05-14 DIAGNOSIS — Z7902 Long term (current) use of antithrombotics/antiplatelets: Secondary | ICD-10-CM | POA: Insufficient documentation

## 2018-05-14 DIAGNOSIS — K6289 Other specified diseases of anus and rectum: Secondary | ICD-10-CM | POA: Diagnosis not present

## 2018-05-14 DIAGNOSIS — I2510 Atherosclerotic heart disease of native coronary artery without angina pectoris: Secondary | ICD-10-CM | POA: Insufficient documentation

## 2018-05-14 DIAGNOSIS — Z79899 Other long term (current) drug therapy: Secondary | ICD-10-CM | POA: Insufficient documentation

## 2018-05-14 DIAGNOSIS — Z538 Procedure and treatment not carried out for other reasons: Secondary | ICD-10-CM | POA: Insufficient documentation

## 2018-05-14 DIAGNOSIS — Z7982 Long term (current) use of aspirin: Secondary | ICD-10-CM | POA: Diagnosis not present

## 2018-05-14 DIAGNOSIS — R197 Diarrhea, unspecified: Secondary | ICD-10-CM | POA: Insufficient documentation

## 2018-05-14 DIAGNOSIS — Z951 Presence of aortocoronary bypass graft: Secondary | ICD-10-CM | POA: Insufficient documentation

## 2018-05-14 DIAGNOSIS — Z6839 Body mass index (BMI) 39.0-39.9, adult: Secondary | ICD-10-CM | POA: Diagnosis not present

## 2018-05-14 DIAGNOSIS — K644 Residual hemorrhoidal skin tags: Secondary | ICD-10-CM | POA: Diagnosis not present

## 2018-05-14 DIAGNOSIS — K5900 Constipation, unspecified: Secondary | ICD-10-CM | POA: Insufficient documentation

## 2018-05-14 DIAGNOSIS — I252 Old myocardial infarction: Secondary | ICD-10-CM | POA: Insufficient documentation

## 2018-05-14 DIAGNOSIS — K602 Anal fissure, unspecified: Secondary | ICD-10-CM | POA: Diagnosis not present

## 2018-05-14 DIAGNOSIS — Z7984 Long term (current) use of oral hypoglycemic drugs: Secondary | ICD-10-CM | POA: Diagnosis not present

## 2018-05-14 DIAGNOSIS — K6282 Dysplasia of anus: Secondary | ICD-10-CM | POA: Diagnosis not present

## 2018-05-14 DIAGNOSIS — E785 Hyperlipidemia, unspecified: Secondary | ICD-10-CM | POA: Diagnosis not present

## 2018-05-14 DIAGNOSIS — I10 Essential (primary) hypertension: Secondary | ICD-10-CM | POA: Insufficient documentation

## 2018-05-14 DIAGNOSIS — E119 Type 2 diabetes mellitus without complications: Secondary | ICD-10-CM | POA: Insufficient documentation

## 2018-05-14 DIAGNOSIS — K641 Second degree hemorrhoids: Secondary | ICD-10-CM | POA: Insufficient documentation

## 2018-05-14 DIAGNOSIS — E1140 Type 2 diabetes mellitus with diabetic neuropathy, unspecified: Secondary | ICD-10-CM | POA: Diagnosis not present

## 2018-05-14 HISTORY — PX: HEMORRHOID SURGERY: SHX153

## 2018-05-14 LAB — GLUCOSE, CAPILLARY
Glucose-Capillary: 114 mg/dL — ABNORMAL HIGH (ref 70–99)
Glucose-Capillary: 98 mg/dL (ref 70–99)

## 2018-05-14 SURGERY — HEMORRHOIDECTOMY
Anesthesia: General

## 2018-05-14 MED ORDER — FENTANYL CITRATE (PF) 100 MCG/2ML IJ SOLN: 25.0000 ug | INTRAMUSCULAR | Status: DC | PRN

## 2018-05-14 MED ORDER — PROPOFOL 500 MG/50ML IV EMUL
INTRAVENOUS | Status: AC
  Filled 2018-05-14: qty 50

## 2018-05-14 MED ORDER — DOCUSATE SODIUM 100 MG PO CAPS: 100.0000 mg | ORAL_CAPSULE | Freq: Two times a day (BID) | ORAL | 0 refills | Status: AC | PRN

## 2018-05-14 MED ORDER — PROPOFOL 10 MG/ML IV BOLUS
INTRAVENOUS | Status: AC
  Filled 2018-05-14: qty 20

## 2018-05-14 MED ORDER — FAMOTIDINE 20 MG PO TABS
ORAL_TABLET | ORAL | Status: AC
  Administered 2018-05-14: 20 mg via ORAL
  Filled 2018-05-14: qty 1

## 2018-05-14 MED ORDER — BUPIVACAINE LIPOSOME 1.3 % IJ SUSP
INTRAMUSCULAR | Status: AC
  Filled 2018-05-14: qty 20

## 2018-05-14 MED ORDER — CHLORHEXIDINE GLUCONATE CLOTH 2 % EX PADS: 6.0000 | MEDICATED_PAD | Freq: Once | CUTANEOUS | Status: DC

## 2018-05-14 MED ORDER — FENTANYL CITRATE (PF) 100 MCG/2ML IJ SOLN
INTRAMUSCULAR | Status: DC | PRN
  Administered 2018-05-14: 10 ug via INTRAVENOUS
  Administered 2018-05-14: 25 ug via INTRAVENOUS
  Administered 2018-05-14: 15 ug via INTRAVENOUS
  Administered 2018-05-14 (×2): 25 ug via INTRAVENOUS

## 2018-05-14 MED ORDER — ONABOTULINUMTOXINA 100 UNITS IJ SOLR
100.0000 [IU] | INTRAMUSCULAR | Status: DC
  Filled 2018-05-14: qty 100

## 2018-05-14 MED ORDER — MIDAZOLAM HCL 2 MG/2ML IJ SOLN
INTRAMUSCULAR | Status: AC
  Filled 2018-05-14: qty 2

## 2018-05-14 MED ORDER — SODIUM CHLORIDE (PF) 0.9 % IJ SOLN
INTRAMUSCULAR | Status: AC
  Filled 2018-05-14: qty 50

## 2018-05-14 MED ORDER — BUPIVACAINE LIPOSOME 1.3 % IJ SUSP
INTRAMUSCULAR | Status: DC | PRN
  Administered 2018-05-14: 20 mL

## 2018-05-14 MED ORDER — EPINEPHRINE PF 1 MG/ML IJ SOLN
INTRAMUSCULAR | Status: AC
  Filled 2018-05-14: qty 1

## 2018-05-14 MED ORDER — GABAPENTIN 300 MG PO CAPS
ORAL_CAPSULE | ORAL | Status: AC
  Administered 2018-05-14: 300 mg via ORAL
  Filled 2018-05-14: qty 1

## 2018-05-14 MED ORDER — FENTANYL CITRATE (PF) 100 MCG/2ML IJ SOLN
INTRAMUSCULAR | Status: AC
  Filled 2018-05-14: qty 2

## 2018-05-14 MED ORDER — GELATIN ABSORBABLE 100 CM EX MISC
CUTANEOUS | Status: AC
  Filled 2018-05-14: qty 1

## 2018-05-14 MED ORDER — ACETAMINOPHEN 500 MG PO TABS
ORAL_TABLET | ORAL | Status: AC
  Administered 2018-05-14: 1000 mg via ORAL
  Filled 2018-05-14: qty 2

## 2018-05-14 MED ORDER — CELECOXIB 200 MG PO CAPS
200.0000 mg | ORAL_CAPSULE | ORAL | Status: AC
  Administered 2018-05-14: 200 mg via ORAL

## 2018-05-14 MED ORDER — IBUPROFEN 800 MG PO TABS: 800.0000 mg | ORAL_TABLET | Freq: Three times a day (TID) | ORAL | 0 refills | Status: DC | PRN

## 2018-05-14 MED ORDER — PROPOFOL 500 MG/50ML IV EMUL
INTRAVENOUS | Status: DC | PRN
  Administered 2018-05-14: 100 ug/kg/min via INTRAVENOUS

## 2018-05-14 MED ORDER — BUPIVACAINE HCL (PF) 0.5 % IJ SOLN
INTRAMUSCULAR | Status: AC
  Filled 2018-05-14: qty 30

## 2018-05-14 MED ORDER — ACETAMINOPHEN 325 MG PO TABS: 650.0000 mg | ORAL_TABLET | Freq: Three times a day (TID) | ORAL | 0 refills | Status: AC | PRN

## 2018-05-14 MED ORDER — MIDAZOLAM HCL 2 MG/2ML IJ SOLN
INTRAMUSCULAR | Status: DC | PRN
  Administered 2018-05-14: 2 mg via INTRAVENOUS

## 2018-05-14 MED ORDER — SODIUM CHLORIDE 0.9 % IV SOLN
INTRAVENOUS | Status: DC
  Administered 2018-05-14: 08:00:00 via INTRAVENOUS

## 2018-05-14 MED ORDER — CELECOXIB 200 MG PO CAPS
ORAL_CAPSULE | ORAL | Status: AC
  Administered 2018-05-14: 200 mg via ORAL
  Filled 2018-05-14: qty 1

## 2018-05-14 MED ORDER — GABAPENTIN 300 MG PO CAPS
300.0000 mg | ORAL_CAPSULE | ORAL | Status: AC
  Administered 2018-05-14: 300 mg via ORAL

## 2018-05-14 MED ORDER — STERILE WATER FOR INJECTION IJ SOLN
INTRAMUSCULAR | Status: AC
  Filled 2018-05-14: qty 10

## 2018-05-14 MED ORDER — ACETAMINOPHEN 500 MG PO TABS
1000.0000 mg | ORAL_TABLET | ORAL | Status: AC
  Administered 2018-05-14: 1000 mg via ORAL

## 2018-05-14 MED ORDER — FAMOTIDINE 20 MG PO TABS
20.0000 mg | ORAL_TABLET | Freq: Once | ORAL | Status: AC
  Administered 2018-05-14: 20 mg via ORAL

## 2018-05-14 MED ORDER — ONDANSETRON HCL 4 MG/2ML IJ SOLN
INTRAMUSCULAR | Status: DC | PRN
  Administered 2018-05-14: 4 mg via INTRAVENOUS

## 2018-05-14 MED ORDER — SEVOFLURANE IN SOLN
RESPIRATORY_TRACT | Status: AC
  Filled 2018-05-14: qty 250

## 2018-05-14 MED ORDER — DEXAMETHASONE SODIUM PHOSPHATE 10 MG/ML IJ SOLN
INTRAMUSCULAR | Status: DC | PRN
  Administered 2018-05-14: 5 mg via INTRAVENOUS

## 2018-05-14 MED ORDER — BUPIVACAINE-EPINEPHRINE (PF) 0.5% -1:200000 IJ SOLN
INTRAMUSCULAR | Status: DC | PRN
  Administered 2018-05-14: 7 mL

## 2018-05-14 MED ORDER — ACETAMINOPHEN 10 MG/ML IV SOLN
INTRAVENOUS | Status: AC
  Filled 2018-05-14: qty 100

## 2018-05-14 MED ORDER — ONABOTULINUMTOXINA 100 UNITS IJ SOLR: 100.0000 [IU] | Freq: Once | INTRAMUSCULAR | Status: DC

## 2018-05-14 SURGICAL SUPPLY — 32 items
BLADE SURG 15 STRL LF DISP TIS (BLADE) ×1 IMPLANT
BLADE SURG 15 STRL SS (BLADE) ×2
BRIEF STRETCH MATERNITY 2XLG (MISCELLANEOUS) ×2 IMPLANT
CANISTER SUCT 1200ML W/VALVE (MISCELLANEOUS) ×2 IMPLANT
COVER WAND RF STERILE (DRAPES) IMPLANT
DRAPE PERI LITHO V/GYN (MISCELLANEOUS) ×2 IMPLANT
DRAPE UNDER BUTTOCK W/FLU (DRAPES) ×2 IMPLANT
DRSG GAUZE FLUFF 36X18 (GAUZE/BANDAGES/DRESSINGS) ×2 IMPLANT
ELECT REM PT RETURN 9FT ADLT (ELECTROSURGICAL) ×2
ELECTRODE REM PT RTRN 9FT ADLT (ELECTROSURGICAL) ×1 IMPLANT
GAUZE FLUFF 18X24 1PLY STRL (GAUZE/BANDAGES/DRESSINGS) ×1 IMPLANT
GLOVE BIOGEL PI IND STRL 7.0 (GLOVE) ×1 IMPLANT
GLOVE BIOGEL PI INDICATOR 7.0 (GLOVE) ×1
GLOVE SURG SYN 7.0 (GLOVE) ×2 IMPLANT
GLOVE SURG SYN 7.0 PF PI (GLOVE) ×1 IMPLANT
GOWN STRL REUS W/ TWL LRG LVL3 (GOWN DISPOSABLE) ×2 IMPLANT
GOWN STRL REUS W/TWL LRG LVL3 (GOWN DISPOSABLE) ×4
KIT TURNOVER CYSTO (KITS) ×2 IMPLANT
LABEL OR SOLS (LABEL) ×2 IMPLANT
NDL HYPO 25X1 1.5 SAFETY (NEEDLE) ×1 IMPLANT
NEEDLE HYPO 22GX1.5 SAFETY (NEEDLE) ×2 IMPLANT
NEEDLE HYPO 25X1 1.5 SAFETY (NEEDLE) ×2 IMPLANT
NS IRRIG 500ML POUR BTL (IV SOLUTION) ×2 IMPLANT
PACK BASIN MINOR ARMC (MISCELLANEOUS) ×2 IMPLANT
PAD PREP 24X41 OB/GYN DISP (PERSONAL CARE ITEMS) ×2 IMPLANT
SCRUB POVIDONE IODINE 4 OZ (MISCELLANEOUS) ×2 IMPLANT
SURGILUBE 2OZ TUBE FLIPTOP (MISCELLANEOUS) ×2 IMPLANT
SUT VIC AB 3-0 SH 27 (SUTURE) ×2
SUT VIC AB 3-0 SH 27X BRD (SUTURE) ×1 IMPLANT
SYR 10ML LL (SYRINGE) ×2 IMPLANT
SYR 20CC LL (SYRINGE) ×2 IMPLANT
SYR BULB IRRIG 60ML STRL (SYRINGE) ×2 IMPLANT

## 2018-05-14 NOTE — Anesthesia Postprocedure Evaluation (Signed)
Anesthesia Post Note  Patient: Nathan Hull  Procedure(s) Performed: HEMORRHOIDECTOMY (N/A )  Patient location during evaluation: PACU Anesthesia Type: General Level of consciousness: awake and alert Pain management: pain level controlled Vital Signs Assessment: post-procedure vital signs reviewed and stable Respiratory status: spontaneous breathing, nonlabored ventilation, respiratory function stable and patient connected to nasal cannula oxygen Cardiovascular status: blood pressure returned to baseline and stable Postop Assessment: no apparent nausea or vomiting Anesthetic complications: no     Last Vitals:  Vitals:   05/14/18 1044 05/14/18 1103  BP: 126/80   Pulse: (!) 53 (!) 52  Resp: 11 14  Temp: 36.6 C (!) 36.2 C  SpO2: 100% 100%    Last Pain:  Vitals:   05/14/18 1103  TempSrc: Temporal  PainSc: 0-No pain                 Durenda Hurt

## 2018-05-14 NOTE — Discharge Instructions (Addendum)
tylenol and advil as needed for discomfort.  Please alternate between the two every four hours as needed for pain.    Use narcotics, if prescribed, only when tylenol and motrin is not enough to control pain.  325-650mg  every 8hrs to max of 4000mg /24hrs (including the 325mg  in every norco dose) for the tylenol.    Advil up to 800mg  per dose every 8hrs as needed for pain.    Continue to use stool softners to maintain soft bowel movements Sitz bath as needed  Will be referring case to colorectal surgeon for further treatment of complex anal fissure    AMBULATORY SURGERY  DISCHARGE INSTRUCTIONS   1) The drugs that you were given will stay in your system until tomorrow so for the next 24 hours you should not:  A) Drive an automobile B) Make any legal decisions C) Drink any alcoholic beverage   2) You may resume regular meals tomorrow.  Today it is better to start with liquids and gradually work up to solid foods.  You may eat anything you prefer, but it is better to start with liquids, then soup and crackers, and gradually work up to solid foods.   3) Please notify your doctor immediately if you have any unusual bleeding, trouble breathing, redness and pain at the surgery site, drainage, fever, or pain not relieved by medication.    4) Additional Instructions:   May resume plavis & aspirin 48 hours per Dr. Lysle Pearl        Please contact your physician with any problems or Same Day Surgery at [phone], Monday through Friday 6 am to 4 pm, or [place] at [facility] number at [phone].

## 2018-05-14 NOTE — Op Note (Signed)
Preoperative diagnosis: Anal fissure, second-degree hemorrhoids Postoperative diagnosis: Same as above plus anal lesion.  Procedure: exam under anesthesia, anal lesion removal Surgeon: Lysle Pearl  Anesthesia: MAC  Specimen: Anal skin lesion x1  Complications: none  EBL: 52m  Wound classification: Clean Contaminated  Indications: Patient is a 58y.o. male was found to have symptomatic anal fissure and hemorrhoids refractory to medical management.  Exam under anesthesia was indicated for full assessment of the extent of the above diagnosis  Findings: 1.  Large circumferential second-degree degree hemorrhoids 2.  Extensive anal fissure extending down to the muscle fibers with chronic tissue inflammation surrounding it along with multiple skin tags 3.  Anal skin lesion noted on top left lateral hemorrhoidal column excised for further biopsy.  Description of procedure: The patient was brought to the operating room and general anesthesia was induced. Patient was placed high lithotomy position. A time-out was completed verifying correct patient, procedure, site, positioning, and implant(s) and/or special equipment prior to beginning this procedure. The perineum was prepped and draped in standard sterile fashion. Local anesthetic was injected as a perianal block. An anoscope was introduced and circumferential second-degree hemorrhoids were noted on both lateral aspects.  At the posterior midline there was noted to be a very wide anal fissure with visible muscle fibers extending posterior, with extensive skin chronic scarring and excess tissue surrounding the entire wound.  Full visualization of the extent of the anal fissure was not possible secondary to patient body habitus and the degree of inflammatory tissue and large hemorrhoid surrounding it.  Partial palpation of the internal/external sphincters was possible but unable to get a good visual and a firm palpation in order to safely inject Botox  surrounding the fissure.  Decision was made at this point to abort the procedure and refer the patient to a colorectal specialist for further treatment of this complex anal fissure.  Further inspection of the lateral hemorrhoids noted a typical skin tag type lesion noted on the left lateral aspect atop the hemorrhoid column.  This lesion was firm to the touch with friable tissue.  Since the lesion was well away from the anal lesion and the concerns for possibilities other than a typical skin tag was high, decision was made to proceed with an excisional biopsy of this area. A Kelly clamp was placed across the base of the lesion and retracted externally to exteriorize it.  3-0 vicryl was placed at the most interior aspect of the base, and tissue removed using 15 blade and passed off operative field pending pathology.  The clamp was removed and the previously placed 3-0 vicryl was used to close the open base in a running fashion, ensuring no sphincter muscle was sutured into the wound.     Inspection of the anal canal did not note any additional pathology and hemostasis achieved.  Exparel was injected at base of excision site and around the visible portion of the anal fissure.  A gauze pad was tucked between the gluteal folds, and secured in place with mesh underwear.  The patient tolerated the procedure well and was taken to the postanesthesia care unit in stable condition.  Sponge and instrument count were correct at end of procedure.

## 2018-05-14 NOTE — Anesthesia Post-op Follow-up Note (Signed)
Anesthesia QCDR form completed.        

## 2018-05-14 NOTE — Anesthesia Preprocedure Evaluation (Addendum)
Anesthesia Evaluation  Patient identified by MRN, date of birth, ID band Patient awake    Reviewed: Allergy & Precautions, H&P , NPO status , Patient's Chart, lab work & pertinent test results  History of Anesthesia Complications (+) DIFFICULT AIRWAY and Family history of anesthesia reaction  Airway Mallampati: III       Dental  (+) Chipped   Pulmonary shortness of breath, former smoker,           Cardiovascular hypertension, + CAD, + Past MI and + CABG (2015, 3 vessel)       Neuro/Psych  Neuromuscular disease (peipheral neuropathy) negative psych ROS   GI/Hepatic negative GI ROS, Neg liver ROS,   Endo/Other  diabetesMorbid obesity  Renal/GU negative Renal ROS  negative genitourinary   Musculoskeletal   Abdominal   Peds  Hematology negative hematology ROS (+)   Anesthesia Other Findings Past Medical History: No date: Arthritis No date: CAD (coronary artery disease) No date: Diabetes mellitus without complication ([place]) 46/56/8127: Difficult intubation     Comment:  reports told difficult after CABG at [facility].  Glidescope               used No date: Dyspnea     Comment:  walking a mile No date: Family history of anesthesia complication     Comment:  mother had N/V after valve replacement No date: History of kidney stones No date: Hyperlipidemia No date: Hypertension 2007: Myocardial infarct ([place]) No date: Obesity No date: Wears contact lenses  Past Surgical History: 08/16/15: APPLICATION OF WOUND VAC; Right     Comment:  Procedure: APPLICATION OF WOUND VAC;  Surgeon: [name], MD;  Location: [facility] OR;  Service: Vascular;                Laterality: Right; No date: C-Spine disc replacement x 2     Comment:  06/16/2012 No date: CARDIAC CATHETERIZATION     Comment:  last week @ [facility] No date: cardiac stents     Comment:  has been cathed x 4   Had  MI  in 2007 No date: CARPAL TUNNEL  RELEASE     Comment:  on right 04/26/2016: COLONOSCOPY WITH PROPOFOL; N/A     Comment:  Procedure: COLONOSCOPY WITH PROPOFOL;  Surgeon: [name], MD;  Location: [place];  Service:               Endoscopy;  Laterality: N/A; 09/24/2013: CORONARY ARTERY BYPASS GRAFT; N/A     Comment:  Procedure: CORONARY ARTERY BYPASS GRAFTING (CABG) x               three, using LIMA to LAD, and right leg greater saphenous              vein harvested endoscopically - SVG to Ramus, SVG to PL;               Surgeon: [name], MD;  Location: [place];  Service:              Open Heart Surgery;  Laterality: N/A; 10/22/2013: I&D EXTREMITY; Right     Comment:  Procedure: IRRIGATION AND DEBRIDEMENT EXTREMITY-RIGHT               LEG;  Surgeon: [name], MD;  Location: [place];  Service: Vascular;  Laterality: Right; 09/24/2013: INTRAOPERATIVE TRANSESOPHAGEAL ECHOCARDIOGRAM; N/A     Comment:  Procedure: INTRAOPERATIVE TRANSESOPHAGEAL               ECHOCARDIOGRAM;  Surgeon: [name], MD;  Location:              [place];  Service: Open Heart Surgery;  Laterality: N/A; 04/26/2016: POLYPECTOMY     Comment:  Procedure: POLYPECTOMY;  Surgeon: [name], MD;                Location: [place];  Service: Endoscopy;; No date: PTCA at [place]:  04/28/2005 No date: SPINAL FUSION     Comment:  vertebra, congentital : Dr. Annette Stable  No date: TONSILLECTOMY     Reproductive/Obstetrics negative OB ROS                            Anesthesia Physical Anesthesia Plan  ASA: III  Anesthesia Plan: General   Post-op Pain Management:    Induction:   PONV Risk Score and Plan: Propofol infusion and Midazolam  Airway Management Planned: Simple Face Mask and Natural Airway  Additional Equipment:   Intra-op Plan:   Post-operative Plan:   Informed Consent: I have reviewed the patients History and Physical, chart, labs and  discussed the procedure including the risks, benefits and alternatives for the proposed anesthesia with the patient or authorized representative who has indicated his/her understanding and acceptance.     Dental Advisory Given  Plan Discussed with: Anesthesiologist and CRNA  Anesthesia Plan Comments:        Anesthesia Quick Evaluation

## 2018-05-14 NOTE — Transfer of Care (Signed)
Immediate Anesthesia Transfer of Care Note  Patient: Nathan Hull  Procedure(s) Performed: HEMORRHOIDECTOMY (N/A )  Patient Location: PACU  Anesthesia Type:General  Level of Consciousness: awake, alert , oriented and patient cooperative  Airway & Oxygen Therapy: Patient Spontanous Breathing and Patient connected to face mask oxygen  Post-op Assessment: Report given to RN and Post -op Vital signs reviewed and stable  Post vital signs: Reviewed and stable  Last Vitals:  Vitals Value Taken Time  BP 123/52 05/14/2018 10:15 AM  Temp 36.8 C 05/14/2018 10:14 AM  Pulse 64 05/14/2018 10:18 AM  Resp 12 05/14/2018 10:18 AM  SpO2 100 % 05/14/2018 10:18 AM  Vitals shown include unvalidated device data.  Last Pain:  Vitals:   05/14/18 1014  TempSrc:   PainSc: 0-No pain         Complications: No apparent anesthesia complications

## 2018-05-14 NOTE — Interval H&P Note (Signed)
History and Physical Interval Note:  05/14/2018 7:56 AM  Nathan Hull  has presented today for surgery, with the diagnosis of Rectal Pain, Hemorrhoids, Possible Anal Fissure  The various methods of treatment have been discussed with the patient and family. After consideration of risks, benefits and other options for treatment, the patient has consented to  Procedure(s): HEMORRHOIDECTOMY (N/A) Possible BOTOX INJECTION (N/A) as a surgical intervention .  The patient's history has been reviewed, patient examined, no change in status, stable for surgery.  I have reviewed the patient's chart and labs.  Questions were answered to the patient's satisfaction.     Nathan Hull

## 2018-05-14 NOTE — Anesthesia Procedure Notes (Signed)
Performed by: Kelton Pillar, CRNA Oxygen Delivery Method: Simple face mask Ventilation: Oral airway inserted - appropriate to patient size Airway Equipment and Method: Oral airway (#10) Comments: POM mask

## 2018-05-15 ENCOUNTER — Encounter: Payer: Self-pay | Admitting: Surgery

## 2018-05-18 LAB — SURGICAL PATHOLOGY

## 2018-05-19 ENCOUNTER — Telehealth: Payer: Self-pay | Admitting: Family Medicine

## 2018-05-19 ENCOUNTER — Other Ambulatory Visit: Payer: Self-pay | Admitting: Family Medicine

## 2018-05-19 MED ORDER — OXYCODONE-ACETAMINOPHEN 10-325 MG PO TABS: 1.0000 | ORAL_TABLET | Freq: Four times a day (QID) | ORAL | 0 refills | Status: DC | PRN

## 2018-05-19 NOTE — Telephone Encounter (Signed)
°  Dr Lysle Pearl has referred pt to a surgeon in [facility] - Dr. Audie Clear. Pain medication has run out.  Pt is in a lot of pain and had same day surgery last week.   Pt is needing a refill on: oxyCODONE-acetaminophen (PERCOCET) 10-325 MG tablet  Please fill at:  [id number] - [place], [place] - [address] [phone] (Phone) [phone] (Fax)   Thanks, [employer]

## 2018-05-19 NOTE — Telephone Encounter (Signed)
Please review. KW 

## 2018-05-20 ENCOUNTER — Encounter: Payer: Self-pay | Admitting: Physician Assistant

## 2018-05-20 ENCOUNTER — Ambulatory Visit: Payer: BLUE CROSS/BLUE SHIELD | Admitting: Physician Assistant

## 2018-05-20 VITALS — BP 166/97 | HR 56 | Temp 97.9°F | Wt 279.8 lb

## 2018-05-20 DIAGNOSIS — E782 Mixed hyperlipidemia: Secondary | ICD-10-CM | POA: Diagnosis not present

## 2018-05-20 DIAGNOSIS — Z981 Arthrodesis status: Secondary | ICD-10-CM | POA: Insufficient documentation

## 2018-05-20 DIAGNOSIS — I25810 Atherosclerosis of coronary artery bypass graft(s) without angina pectoris: Secondary | ICD-10-CM

## 2018-05-20 DIAGNOSIS — E119 Type 2 diabetes mellitus without complications: Secondary | ICD-10-CM | POA: Diagnosis not present

## 2018-05-20 DIAGNOSIS — Z951 Presence of aortocoronary bypass graft: Secondary | ICD-10-CM

## 2018-05-20 DIAGNOSIS — I10 Essential (primary) hypertension: Secondary | ICD-10-CM | POA: Diagnosis not present

## 2018-05-20 DIAGNOSIS — K602 Anal fissure, unspecified: Secondary | ICD-10-CM

## 2018-05-20 NOTE — Progress Notes (Signed)
Patient: Nathan Hull Male    DOB: 07-Jun-1960   58 y.o.   MRN: [id number] Visit Date: 05/20/2018  Today's Provider: Trinna Post, PA-C   Chief Complaint  Patient presents with  . Hypertension  . Diabetes   Subjective:    HPI  Diabetes Mellitus Type II, Follow-up:   Lab Results  Component Value Date   HGBA1C 6.8 (A) 04/29/2018   HGBA1C 7.4 (A) 01/26/2018   HGBA1C 6.9 (A) 10/24/2017   Last seen for diabetes 3 weeks ago.  Management since then includes none. He reports good compliance with treatment. He is not having side effects.  Current symptoms include none and have been stable. Home blood sugar records: none  Has had diabetes for two years. Was previously on farxiga and did like it, now on glipizide. Wilder Glade was discontinued for potential that it could have contributed to anal fissure.  Did not tolerate metformin, had diarrhea. Eye exam from Dr. Ellin Mayhew UTD on 06/2017.  Episodes of hypoglycemia? no   Current Insulin Regimen: None Most Recent Eye Exam: 06/2017 [facility] [place] Weight trend: stable Prior visit with dietician: no Current diet: well balanced Current exercise: none  ------------------------------------------------------------------------   Hypertension, follow-up:  BP Readings from Last 3 Encounters:  05/20/18 (!) 166/97  05/14/18 126/80  05/12/18 129/81    He was last seen for hypertension 3 weeks ago.  BP at that visit was 118/74. Management since that visit includes none.He reports good compliance with treatment. He is taking lisinopril 10 mg and  He is not having side effects.  He is not exercising. He is adherent to low salt diet.   Outside blood pressures are normal. He is experiencing none.  Patient denies chest pain, chest pressure/discomfort, exertional chest pressure/discomfort, fatigue, irregular heart beat, lower extremity edema, palpitations and tachypnea.   Cardiovascular risk factors include advanced age  (older than 81 for men, 34 for women), diabetes mellitus and hypertension.  Use of agents associated with hypertension: none.   MI: 2007, has stents and triple bypass surgery likely around 2012. He is followed by Dr. Humphrey Rolls for cardiology but receives his medications from this office. He is currently on Aspirin and Plavix.   Chronic Pain: He has a history of a C5-C7 ACDF for DDD performed by Dr. Trenton Gammon at [facility]. MRI C-Spine from 11/13/2011 showed C4-C5 discu bulge with effacement of anterior CSF space and narrowed neuroforaminal narrowing. CT 06/2012 of C-spine again demonstrated bulging discs in C2-C3, C3-C4, C4-C5. He has tried gabapentin for this without success. Over the past year he has been using tramadol 50 mg QID PRN for this. Does not take this every day. Most recently he has not been using this as he has had pain medication regarding anal fissure.   Anal Fissure: Patient reports dealing with rectal pain for roughly a year now. He has had issues with thrombosed hemorrhoids, had tried conservative treatments like steroids and nitroglycerin paste. He recently underwent surgical procedure on 05/14/2018 with Dr. Lysle Pearl where a wide anal fissure with visible muscle fibers was noted. Full visualization was unable to be obtained during procedure and procedure was thus aborted. Patient has appointment upcoming on 05/27/2018 with specialist at [place].  BP Readings from Last 3 Encounters:  05/20/18 (!) 166/97  05/14/18 126/80  05/12/18 129/81   Out Monday and Friday   ------------------------------------------------------------------------        Allergies  Allergen Reactions  . Metformin And Related Other (See Comments)  Abdominal pain and diarrhea     Current Outpatient Medications:  .  acetaminophen (TYLENOL) 325 MG tablet, Take 2 tablets (650 mg total) by mouth every 8 (eight) hours as needed for up to 30 days for mild pain., Disp: 40 tablet, Rfl: 0 .  aspirin EC 325 MG EC tablet,  Take 1 tablet (325 mg total) by mouth daily., Disp: , Rfl:  .  atorvastatin (LIPITOR) 80 MG tablet, TAKE 1 TABLET BY MOUTH EVERY DAY, Disp: 90 tablet, Rfl: 3 .  Blood Glucose Monitoring Suppl (ONE TOUCH ULTRA MINI) w/Device KIT, USE AS DIRECTED, Disp: 1 each, Rfl: 0 .  clopidogrel (PLAVIX) 75 MG tablet, TAKE 1 TABLET BY MOUTH EVERY DAY (Patient taking differently: Take 75 mg by mouth daily. ), Disp: 90 tablet, Rfl: 3 .  docusate sodium (COLACE) 100 MG capsule, Take 1 capsule (100 mg total) by mouth 2 (two) times daily as needed for up to 10 days for mild constipation., Disp: 20 capsule, Rfl: 0 .  glipiZIDE (GLUCOTROL) 10 MG tablet, Take one 30 minutes before the two biggest meals of the day. (Patient taking differently: Take 10 mg by mouth 2 (two) times daily before a meal. ), Disp: 60 tablet, Rfl: 3 .  ibuprofen (ADVIL,MOTRIN) 800 MG tablet, Take 1 tablet (800 mg total) by mouth every 8 (eight) hours as needed for mild pain or moderate pain., Disp: 30 tablet, Rfl: 0 .  lisinopril (PRINIVIL,ZESTRIL) 10 MG tablet, Take 10 mg by mouth daily. , Disp: , Rfl:  .  metoprolol succinate (TOPROL-XL) 100 MG 24 hr tablet, Take 100 mg by mouth daily., Disp: , Rfl: 1 .  ONE TOUCH ULTRA TEST test strip, USE AS DIRECTED, Disp: 100 each, Rfl: 1 .  ONETOUCH DELICA LANCETS FINE MISC, See admin instructions., Disp: , Rfl: 0 .  oxyCODONE-acetaminophen (PERCOCET) 10-325 MG tablet, Take 1 tablet by mouth every 6 (six) hours as needed for pain. Stop tramadol while on this medication, Disp: 20 tablet, Rfl: 0 .  traMADol (ULTRAM) 50 MG tablet, TAKE 1 TABLET (50 MG TOTAL) BY MOUTH EVERY 6 (SIX) HOURS AS NEEDED FOR MODERATE PAIN. (Patient taking differently: Take 50 mg by mouth every 6 (six) hours as needed for moderate pain. ), Disp: 120 tablet, Rfl: 0  Review of Systems  HENT: Negative.   Respiratory: Negative.   Genitourinary: Negative.   Neurological: Negative.     Social History   Tobacco Use  . Smoking status:  Former Smoker    Packs/day: 2.00    Types: Cigarettes    Last attempt to quit: 04/23/2005    Years since quitting: 13.0  . Smokeless tobacco: Never Used  Substance Use Topics  . Alcohol use: Yes    Alcohol/week: 6.0 standard drinks    Types: 6 Cans of beer per week    Comment: half a 6 pack a month      Objective:   BP (!) 166/97 (BP Location: Left Arm, Patient Position: Sitting, Cuff Size: Large)   Pulse (!) 56   Temp 97.9 F (36.6 C) (Oral)   Wt 279 lb 12.8 oz (126.9 kg)   BMI 37.95 kg/m  Vitals:   05/20/18 1154  BP: (!) 166/97  Pulse: (!) 56  Temp: 97.9 F (36.6 C)  TempSrc: Oral  Weight: 279 lb 12.8 oz (126.9 kg)     Physical Exam Constitutional:      Appearance: Normal appearance.  Neck:     Musculoskeletal: Neck supple.  Cardiovascular:  Rate and Rhythm: Normal rate and regular rhythm.     Heart sounds: Normal heart sounds.  Pulmonary:     Effort: Pulmonary effort is normal.     Breath sounds: Normal breath sounds.  Skin:    General: Skin is warm and dry.  Neurological:     Mental Status: He is alert and oriented to person, place, and time. Mental status is at baseline.         Assessment & Plan    1. Diabetes mellitus without complication (HCC)  Continue glipizide for now. Will revisit farxiga at later date. Declines pneumonia vaccination today, I will ask again at later date. Eye exam due 07/2018. Foot exam updated today.  2. Mixed hyperlipidemia  Taking 80 mg Lipitor daily, most recent cholesterol controlled.  3. Benign essential HTN  On Lisinopril and metoprolol. BP elevated today but prior readings are normotensive, suspect this is due to pain.   4. Coronary artery disease involving coronary bypass graft of native heart, angina presence unspecified  Followed by Dr. Humphrey Rolls. Continue Plavic.  5. S/P CABG x 3   6. Anal fissure  Surgeon would be best to fill out FMLA but I will fill it out for him if he needs.  7. S/P cervical spinal  fusion  He was on tramadol. Patient does have imaging evidence of disc bulges s/p fusion. He has tried conservative and surgical interventions without success. Counseled on risks of long term opioid therapy. I will fill his tramadol for him but I have advised him we should revisit this in light of anal fissure and how this contributes to constipation, can also try other medications including SNRI if we wean off tramadol.  The entirety of the information documented in the History of Present Illness, Review of Systems and Physical Exam were personally obtained by me. Portions of this information were initially documented by Lynford Humphrey, CMA and reviewed by me for thoroughness and accuracy.   Return in about 4 months (around 09/18/2018) for htn, hld, dm .       Trinna Post, PA-C  [facility]

## 2018-05-22 ENCOUNTER — Encounter: Payer: Self-pay | Admitting: Surgery

## 2018-05-27 DIAGNOSIS — K6289 Other specified diseases of anus and rectum: Secondary | ICD-10-CM | POA: Diagnosis not present

## 2018-06-05 DIAGNOSIS — Z951 Presence of aortocoronary bypass graft: Secondary | ICD-10-CM | POA: Diagnosis not present

## 2018-06-05 DIAGNOSIS — I129 Hypertensive chronic kidney disease with stage 1 through stage 4 chronic kidney disease, or unspecified chronic kidney disease: Secondary | ICD-10-CM | POA: Diagnosis not present

## 2018-06-05 DIAGNOSIS — N189 Chronic kidney disease, unspecified: Secondary | ICD-10-CM | POA: Diagnosis not present

## 2018-06-05 DIAGNOSIS — Z87891 Personal history of nicotine dependence: Secondary | ICD-10-CM | POA: Diagnosis not present

## 2018-06-05 DIAGNOSIS — K644 Residual hemorrhoidal skin tags: Secondary | ICD-10-CM | POA: Diagnosis not present

## 2018-06-05 DIAGNOSIS — E785 Hyperlipidemia, unspecified: Secondary | ICD-10-CM | POA: Diagnosis not present

## 2018-06-05 DIAGNOSIS — I10 Essential (primary) hypertension: Secondary | ICD-10-CM | POA: Diagnosis not present

## 2018-06-05 DIAGNOSIS — K648 Other hemorrhoids: Secondary | ICD-10-CM | POA: Diagnosis not present

## 2018-06-05 DIAGNOSIS — K649 Unspecified hemorrhoids: Secondary | ICD-10-CM | POA: Diagnosis not present

## 2018-06-05 DIAGNOSIS — K602 Anal fissure, unspecified: Secondary | ICD-10-CM | POA: Diagnosis not present

## 2018-06-05 DIAGNOSIS — Z86010 Personal history of colonic polyps: Secondary | ICD-10-CM | POA: Diagnosis not present

## 2018-06-05 DIAGNOSIS — I252 Old myocardial infarction: Secondary | ICD-10-CM | POA: Diagnosis not present

## 2018-06-05 DIAGNOSIS — K6289 Other specified diseases of anus and rectum: Secondary | ICD-10-CM | POA: Diagnosis not present

## 2018-06-05 DIAGNOSIS — E119 Type 2 diabetes mellitus without complications: Secondary | ICD-10-CM | POA: Diagnosis not present

## 2018-06-05 DIAGNOSIS — Z7982 Long term (current) use of aspirin: Secondary | ICD-10-CM | POA: Diagnosis not present

## 2018-06-05 DIAGNOSIS — Z7902 Long term (current) use of antithrombotics/antiplatelets: Secondary | ICD-10-CM | POA: Diagnosis not present

## 2018-06-05 DIAGNOSIS — K645 Perianal venous thrombosis: Secondary | ICD-10-CM | POA: Diagnosis not present

## 2018-07-14 ENCOUNTER — Telehealth: Payer: Self-pay | Admitting: Family Medicine

## 2018-07-14 NOTE — Telephone Encounter (Signed)
He had a colonoscopy in 2018 that showed benign polyps. He is good for another 8 years unless he has new symptoms.

## 2018-07-14 NOTE — Telephone Encounter (Signed)
Please Review

## 2018-07-14 NOTE — Telephone Encounter (Signed)
Pt wants to know if he can get a rx for the cologuard test.  CB#  [phone]  Thanks [employer]

## 2018-07-15 NOTE — Telephone Encounter (Signed)
Patient was advised. Patient did state he has bloody stools. He already has a GI. Patient stated he will call his GI office concerning bloody stools.

## 2018-07-15 NOTE — Telephone Encounter (Signed)
Agreed, he should do that he has had issues with hemorrhoids recently and required extensive surgery for this.

## 2018-07-24 ENCOUNTER — Other Ambulatory Visit: Payer: Self-pay | Admitting: Physician Assistant

## 2018-07-24 DIAGNOSIS — E119 Type 2 diabetes mellitus without complications: Secondary | ICD-10-CM

## 2018-07-24 MED ORDER — GLIPIZIDE 10 MG PO TABS: ORAL_TABLET | ORAL | 3 refills | Status: DC

## 2018-07-24 NOTE — Telephone Encounter (Signed)
[facility] for [facility] following medications:  glipiZIDE (GLUCOTROL) 10 MG tablet   Please advise.  

## 2018-07-24 NOTE — Telephone Encounter (Signed)
Medication refilled

## 2018-08-17 ENCOUNTER — Telehealth: Payer: Self-pay | Admitting: Physician Assistant

## 2018-08-17 DIAGNOSIS — E119 Type 2 diabetes mellitus without complications: Secondary | ICD-10-CM

## 2018-08-17 MED ORDER — DAPAGLIFLOZIN PROPANEDIOL 5 MG PO TABS: 5.0000 mg | ORAL_TABLET | Freq: Every day | ORAL | 0 refills | Status: DC

## 2018-08-17 NOTE — Telephone Encounter (Signed)
Pt called saying he was changed to Glipizide from [place]  He does not want to take to [facility].  He wants to take the [place] and needs a new prescription for this.  [facility] [place]  CB#  [phone]

## 2018-08-17 NOTE — Telephone Encounter (Signed)
Sent in farxiga 5 mg daily. See him at next office visit.

## 2018-08-17 NOTE — Telephone Encounter (Signed)
See my message below 

## 2018-08-17 NOTE — Telephone Encounter (Signed)
I dont see this patient has Nathan Hull as a active drug on his list Glipizide is still listed as taking. Reviewing over your last office note with patient you mentioned possibly putting on Farxiga but would discuss at a later date, I didn't see a telephone encounter where you changed. KW

## 2018-09-18 ENCOUNTER — Encounter: Payer: Self-pay | Admitting: Physician Assistant

## 2018-09-18 ENCOUNTER — Other Ambulatory Visit: Payer: Self-pay

## 2018-09-18 ENCOUNTER — Ambulatory Visit (INDEPENDENT_AMBULATORY_CARE_PROVIDER_SITE_OTHER): Payer: BC Managed Care – PPO | Admitting: Physician Assistant

## 2018-09-18 VITALS — BP 144/76 | HR 65 | Temp 97.8°F | Resp 16 | Ht 72.0 in | Wt 294.4 lb

## 2018-09-18 DIAGNOSIS — Z23 Encounter for immunization: Secondary | ICD-10-CM

## 2018-09-18 DIAGNOSIS — E782 Mixed hyperlipidemia: Secondary | ICD-10-CM | POA: Diagnosis not present

## 2018-09-18 DIAGNOSIS — R21 Rash and other nonspecific skin eruption: Secondary | ICD-10-CM

## 2018-09-18 DIAGNOSIS — E119 Type 2 diabetes mellitus without complications: Secondary | ICD-10-CM

## 2018-09-18 DIAGNOSIS — I10 Essential (primary) hypertension: Secondary | ICD-10-CM

## 2018-09-18 LAB — POCT GLYCOSYLATED HEMOGLOBIN (HGB A1C)
Est. average glucose Bld gHb Est-mCnc: 137
Hemoglobin A1C: 6.4 % — AB (ref 4.0–5.6)

## 2018-09-18 MED ORDER — NYSTATIN 100000 UNIT/GM EX CREA: 1.0000 "application " | TOPICAL_CREAM | Freq: Two times a day (BID) | CUTANEOUS | 0 refills | Status: DC

## 2018-09-18 NOTE — Progress Notes (Signed)
Patient: Nathan Hull Male    DOB: 07-Jul-1960   58 y.o.   MRN: [id number] Visit Date: 09/25/2018  Today's Provider: Trinna Post, PA-C   Chief Complaint  Patient presents with  . Diabetes   Subjective:     HPI  Diabetes Mellitus Type II, Follow-up:   Lab Results  Component Value Date   HGBA1C 6.4 (A) 09/18/2018   HGBA1C 6.8 (A) 04/29/2018   HGBA1C 7.4 (A) 01/26/2018   Last seen for diabetes 4 months ago.  Management since then includes changing glipizide to farxiga. He was previously on farxiga prior to issues with hemorrhoids and it worked well for him.  He reports excellent compliance with treatment. He is not having side effects.  Current symptoms include none and have been stable. Home blood sugar records: not being checked  Episodes of hypoglycemia? no   Current Insulin Regimen:  Most Recent Eye Exam: 07/21/2017 - he is due this year Weight trend: stable Prior visit with dietician: no Current diet: not asked Current exercise: no regular exercise, walking and yard work  ------------------------------------------------------------------------   Hypertension, follow-up:  BP Readings from Last 3 Encounters:  09/18/18 (!) 144/76  05/20/18 (!) 166/97  05/14/18 126/80    He was last seen for hypertension 4 months ago.  BP at that visit was 166/97. Currently taking lisinopril 10 mg daily and metoprolol succinate 25 mg daily.  Management since that visit includes no changes.He reports excellent compliance with treatment. He is not having side effects.  He is not exercising. He is adherent to low salt diet.   Outside blood pressures are stable. He is experiencing none.  Patient denies chest pain.   Cardiovascular risk factors include diabetes mellitus, hypertension and obesity (BMI >= 30 kg/m2).  Use of agents associated with hypertension: none.   ------------------------------------------------------------------------    Lipid/Cholesterol,  Follow-up:   Last seen for this 4 months ago.  Management since that visit includes no changes.  Last Lipid Panel:    Component Value Date/Time   CHOL 134 10/29/2017 0826   TRIG 198 (H) 10/29/2017 0826   HDL 29 (L) 10/29/2017 0826   CHOLHDL 4.6 10/29/2017 0826   LDLCALC 65 10/29/2017 0826    He reports excellent compliance with treatment. Currently taking 80 mg lipitor.  He is not having side effects.   Wt Readings from Last 3 Encounters:  09/18/18 294 lb 6.4 oz (133.5 kg)  05/20/18 279 lb 12.8 oz (126.9 kg)  05/12/18 289 lb 2 oz (131.1 kg)   He is recovering from hemorrhoid surgery at [place]. Had surgery in March 2020. ------------------------------------------------------------------------   Allergies  Allergen Reactions  . Metformin And Related Other (See Comments)    Abdominal pain and diarrhea     Current Outpatient Medications:  .  aspirin EC 325 MG EC tablet, Take 1 tablet (325 mg total) by mouth daily., Disp: , Rfl:  .  atorvastatin (LIPITOR) 80 MG tablet, TAKE 1 TABLET BY MOUTH EVERY DAY, Disp: 90 tablet, Rfl: 3 .  Blood Glucose Monitoring Suppl (ONE TOUCH ULTRA MINI) w/Device KIT, USE AS DIRECTED, Disp: 1 each, Rfl: 0 .  clopidogrel (PLAVIX) 75 MG tablet, TAKE 1 TABLET BY MOUTH EVERY DAY (Patient taking differently: Take 75 mg by mouth daily. ), Disp: 90 tablet, Rfl: 3 .  dapagliflozin propanediol (FARXIGA) 5 MG TABS tablet, Take 5 mg by mouth daily., Disp: 450 mg, Rfl: 0 .  ibuprofen (ADVIL,MOTRIN) 800 MG tablet, Take 1  tablet (800 mg total) by mouth every 8 (eight) hours as needed for mild pain or moderate pain., Disp: 30 tablet, Rfl: 0 .  lisinopril (PRINIVIL,ZESTRIL) 10 MG tablet, Take 10 mg by mouth daily. , Disp: , Rfl:  .  metoprolol succinate (TOPROL-XL) 100 MG 24 hr tablet, Take 100 mg by mouth daily., Disp: , Rfl: 1 .  ONE TOUCH ULTRA TEST test strip, USE AS DIRECTED, Disp: 100 each, Rfl: 1 .  ONETOUCH DELICA LANCETS FINE MISC, See admin instructions.,  Disp: , Rfl: 0 .  oxyCODONE-acetaminophen (PERCOCET) 10-325 MG tablet, Take 1 tablet by mouth every 6 (six) hours as needed for pain. Stop tramadol while on this medication, Disp: 20 tablet, Rfl: 0 .  traMADol (ULTRAM) 50 MG tablet, TAKE 1 TABLET (50 MG TOTAL) BY MOUTH EVERY 6 (SIX) HOURS AS NEEDED FOR MODERATE PAIN. (Patient taking differently: Take 50 mg by mouth every 6 (six) hours as needed for moderate pain. ), Disp: 120 tablet, Rfl: 0  Review of Systems  Constitutional: Negative.   Eyes: Negative.   Respiratory: Negative.   Cardiovascular: Negative.   Endocrine: Negative.     Social History   Tobacco Use  . Smoking status: Former Smoker    Packs/day: 2.00    Types: Cigarettes    Quit date: 04/23/2005    Years since quitting: 13.4  . Smokeless tobacco: Never Used  Substance Use Topics  . Alcohol use: Yes    Alcohol/week: 6.0 standard drinks    Types: 6 Cans of beer per week    Comment: half a 6 pack a month      Objective:   BP (!) 144/76 (BP Location: Right Arm, Patient Position: Sitting, Cuff Size: Large)   Pulse 65   Temp 97.8 F (36.6 C) (Oral)   Resp 16   Ht 6' (1.829 m)   Wt 294 lb 6.4 oz (133.5 kg)   BMI 39.93 kg/m  Vitals:   09/18/18 1554  BP: (!) 144/76  Pulse: 65  Resp: 16  Temp: 97.8 F (36.6 C)  TempSrc: Oral  Weight: 294 lb 6.4 oz (133.5 kg)  Height: 6' (1.829 m)     Physical Exam Constitutional:      Appearance: Normal appearance.  Cardiovascular:     Rate and Rhythm: Normal rate and regular rhythm.     Heart sounds: Normal heart sounds.  Pulmonary:     Effort: Pulmonary effort is normal.     Breath sounds: Normal breath sounds.  Skin:    General: Skin is warm and dry.  Neurological:     Mental Status: He is alert and oriented to person, place, and time. Mental status is at baseline.  Psychiatric:        Mood and Affect: Mood normal.        Behavior: Behavior normal.         Assessment & Plan    1. Diabetes mellitus without  complication (HCC)  L2G improved. He has been instructed to schedule eye exam.   - POCT glycosylated hemoglobin (Hb A1C)  2. Mixed hyperlipidemia  Continue Lipitor 80 mg daily.  3. Need for vaccination for Strep pneumoniae  - Pneumococcal polysaccharide vaccine 23-valent greater than or equal to 2yo subcutaneous/IM  4. Groin rash  5. Hypertension  Continue Lisinopril 10 mg daily and metoprolol succinate 25 mg daily.   The entirety of the information documented in the History of Present Illness, Review of Systems and Physical Exam were personally obtained by me.  Portions of this information were initially documented by Lynford Humphrey, CMA and reviewed by me for thoroughness and accuracy.   F/u 3 months for DM, HTN, HLD     Trinna Post, PA-C  [place]

## 2018-09-24 ENCOUNTER — Other Ambulatory Visit: Payer: Self-pay | Admitting: Physician Assistant

## 2018-09-24 DIAGNOSIS — Z981 Arthrodesis status: Secondary | ICD-10-CM

## 2018-09-24 DIAGNOSIS — M542 Cervicalgia: Secondary | ICD-10-CM

## 2018-09-24 NOTE — Telephone Encounter (Signed)
[facility] the following medications:  traMADol (ULTRAM) 50 MG tablet   Please advise.  

## 2018-09-25 NOTE — Patient Instructions (Signed)
Diabetes Mellitus and Exercise Exercising regularly is important for your overall health, especially when you have diabetes (diabetes mellitus). Exercising is not only about losing weight. It has many other health benefits, such as increasing muscle strength and bone density and reducing body fat and stress. This leads to improved fitness, flexibility, and endurance, all of which result in better overall health. Exercise has additional benefits for people with diabetes, including:  Reducing appetite.  Helping to lower and control blood glucose.  Lowering blood pressure.  Helping to control amounts of fatty substances (lipids) in the blood, such as cholesterol and triglycerides.  Helping the body to respond better to insulin (improving insulin sensitivity).  Reducing how much insulin the body needs.  Decreasing the risk for heart disease by: ? Lowering cholesterol and triglyceride levels. ? Increasing the levels of good cholesterol. ? Lowering blood glucose levels. What is my activity plan? Your health care provider or certified diabetes educator can help you make a plan for the type and frequency of exercise (activity plan) that works for you. Make sure that you:  Do at least 150 minutes of moderate-intensity or vigorous-intensity exercise each week. This could be brisk walking, biking, or water aerobics. ? Do stretching and strength exercises, such as yoga or weightlifting, at least 2 times a week. ? Spread out your activity over at least 3 days of the week.  Get some form of physical activity every day. ? Do not go more than 2 days in a row without some kind of physical activity. ? Avoid being inactive for more than 30 minutes at a time. Take frequent breaks to walk or stretch.  Choose a type of exercise or activity that you enjoy, and set realistic goals.  Start slowly, and gradually increase the intensity of your exercise over time. What do I need to know about managing my  diabetes?   Check your blood glucose before and after exercising. ? If your blood glucose is 240 mg/dL (13.3 mmol/L) or higher before you exercise, check your urine for ketones. If you have ketones in your urine, do not exercise until your blood glucose returns to normal. ? If your blood glucose is 100 mg/dL (5.6 mmol/L) or lower, eat a snack containing 15-20 grams of carbohydrate. Check your blood glucose 15 minutes after the snack to make sure that your level is above 100 mg/dL (5.6 mmol/L) before you start your exercise.  Know the symptoms of low blood glucose (hypoglycemia) and how to treat it. Your risk for hypoglycemia increases during and after exercise. Common symptoms of hypoglycemia can include: ? Hunger. ? Anxiety. ? Sweating and feeling clammy. ? Confusion. ? Dizziness or feeling light-headed. ? Increased heart rate or palpitations. ? Blurry vision. ? Tingling or numbness around the mouth, lips, or tongue. ? Tremors or shakes. ? Irritability.  Keep a rapid-acting carbohydrate snack available before, during, and after exercise to help prevent or treat hypoglycemia.  Avoid injecting insulin into areas of the body that are going to be exercised. For example, avoid injecting insulin into: ? The arms, when playing tennis. ? The legs, when jogging.  Keep records of your exercise habits. Doing this can help you and your health care provider adjust your diabetes management plan as needed. Write down: ? Food that you eat before and after you exercise. ? Blood glucose levels before and after you exercise. ? The type and amount of exercise you have done. ? When your insulin is expected to peak, if you use   insulin. Avoid exercising at times when your insulin is peaking.  When you start a new exercise or activity, work with your health care provider to make sure the activity is safe for you, and to adjust your insulin, medicines, or food intake as needed.  Drink plenty of water while  you exercise to prevent dehydration or heat stroke. Drink enough fluid to keep your urine clear or pale yellow. Summary  Exercising regularly is important for your overall health, especially when you have diabetes (diabetes mellitus).  Exercising has many health benefits, such as increasing muscle strength and bone density and reducing body fat and stress.  Your health care provider or certified diabetes educator can help you make a plan for the type and frequency of exercise (activity plan) that works for you.  When you start a new exercise or activity, work with your health care provider to make sure the activity is safe for you, and to adjust your insulin, medicines, or food intake as needed. This information is not intended to replace advice given to you by your health care provider. Make sure you discuss any questions you have with your health care provider. Document Released: 06/08/2003 Document Revised: 10/10/2016 Document Reviewed: 08/28/2015 Elsevier Patient Education  2020 [employer]  

## 2018-09-25 NOTE — Telephone Encounter (Signed)
What is the pain medication for? I know he was taking some prior to surgery for a hemorrhoid though he has had surgical repair of this. I would hesitate to give this to him for hemorrhoidal or post surgical pain at this point because it is a 4 months post-op and causes constipation which can worsen hemorrhoids. Has he tried the nitroglycerin cream before? I think that might help him more and be safer.

## 2018-09-28 MED ORDER — TRAMADOL HCL 50 MG PO TABS: 50.0000 mg | ORAL_TABLET | Freq: Four times a day (QID) | ORAL | 0 refills | Status: DC | PRN

## 2018-09-28 NOTE — Telephone Encounter (Signed)
Patient takes for neck pain.patient reports he cant take ibuprofen, and reports that's he does not want to take too much tylenol.

## 2018-09-28 NOTE — Telephone Encounter (Signed)
OK I see in his chart he has a history of cervical fusion. I am sending him in 90 pills of the tramadol. We will sign a controlled substance agreement when he comes back into clinic. Please advise that he should be taking a capful of miralax daily as this medication will cause constipation and worsen hemorrhoids.

## 2018-09-29 NOTE — Telephone Encounter (Signed)
Patient advised as below.  

## 2018-10-20 ENCOUNTER — Telehealth: Payer: Self-pay

## 2018-10-20 NOTE — Telephone Encounter (Signed)
Patient's son in law tested positive for the Covid last week, and he was exposed to him. He is not currently having symptoms. He is at work, but wearing a mask. He is requesting to be tested.

## 2018-10-20 NOTE — Telephone Encounter (Signed)
He can do [facility] up testing without order before 3 pm M-F. If he would like order and to be assessed, schedule him virtual visit.

## 2018-10-21 ENCOUNTER — Other Ambulatory Visit: Payer: Self-pay

## 2018-10-21 DIAGNOSIS — R6889 Other general symptoms and signs: Secondary | ICD-10-CM | POA: Diagnosis not present

## 2018-10-21 DIAGNOSIS — Z20822 Contact with and (suspected) exposure to covid-19: Secondary | ICD-10-CM

## 2018-10-21 NOTE — Telephone Encounter (Signed)
Patient was advised and states that he will go to the [facility] building for testing.

## 2018-10-25 LAB — NOVEL CORONAVIRUS, NAA: SARS-CoV-2, NAA: NOT DETECTED

## 2018-11-09 ENCOUNTER — Telehealth: Payer: Self-pay | Admitting: Physician Assistant

## 2018-11-09 DIAGNOSIS — I25810 Atherosclerosis of coronary artery bypass graft(s) without angina pectoris: Secondary | ICD-10-CM

## 2018-11-09 MED ORDER — CLOPIDOGREL BISULFATE 75 MG PO TABS: 75.0000 mg | ORAL_TABLET | Freq: Every day | ORAL | 1 refills | Status: DC

## 2018-11-09 NOTE — Telephone Encounter (Signed)
[facility] faxed refill request for the following medications: ° °clopidogrel (PLAVIX) 75 MG tablet ° ° °Please advise. °

## 2018-11-30 ENCOUNTER — Other Ambulatory Visit: Payer: Self-pay | Admitting: Physician Assistant

## 2018-11-30 DIAGNOSIS — Z981 Arthrodesis status: Secondary | ICD-10-CM

## 2018-11-30 DIAGNOSIS — M542 Cervicalgia: Secondary | ICD-10-CM

## 2018-11-30 DIAGNOSIS — E119 Type 2 diabetes mellitus without complications: Secondary | ICD-10-CM

## 2018-12-29 DIAGNOSIS — R0602 Shortness of breath: Secondary | ICD-10-CM | POA: Diagnosis not present

## 2018-12-29 DIAGNOSIS — I10 Essential (primary) hypertension: Secondary | ICD-10-CM | POA: Diagnosis not present

## 2018-12-29 DIAGNOSIS — I2510 Atherosclerotic heart disease of native coronary artery without angina pectoris: Secondary | ICD-10-CM | POA: Diagnosis not present

## 2018-12-29 DIAGNOSIS — I25810 Atherosclerosis of coronary artery bypass graft(s) without angina pectoris: Secondary | ICD-10-CM | POA: Diagnosis not present

## 2019-01-08 ENCOUNTER — Other Ambulatory Visit: Payer: Self-pay | Admitting: Physician Assistant

## 2019-01-08 MED ORDER — ATORVASTATIN CALCIUM 80 MG PO TABS: ORAL_TABLET | ORAL | 3 refills | Status: DC

## 2019-01-08 NOTE — Telephone Encounter (Signed)
[facility] faxed refill request for the following medications:  atorvastatin (LIPITOR) 80 MG tablet   Please advise.  

## 2019-01-15 ENCOUNTER — Other Ambulatory Visit: Payer: Self-pay | Admitting: Physician Assistant

## 2019-01-15 DIAGNOSIS — Z981 Arthrodesis status: Secondary | ICD-10-CM

## 2019-01-15 DIAGNOSIS — M542 Cervicalgia: Secondary | ICD-10-CM

## 2019-01-27 ENCOUNTER — Other Ambulatory Visit: Payer: Self-pay | Admitting: Physician Assistant

## 2019-01-27 DIAGNOSIS — Z981 Arthrodesis status: Secondary | ICD-10-CM

## 2019-01-27 DIAGNOSIS — M542 Cervicalgia: Secondary | ICD-10-CM

## 2019-01-27 NOTE — Telephone Encounter (Signed)
L.O.V. was on 09/18/18

## 2019-01-28 NOTE — Telephone Encounter (Signed)
Patient has been advised and states that he will call office back early next week to schedule follow up appointment. KW

## 2019-01-28 NOTE — Telephone Encounter (Signed)
Please have patient schedule diabetes follow up and also follow up on pain medication. When I prescribe chronic opioid therapy I have patients come in every 3-4 months at minimum.

## 2019-01-29 ENCOUNTER — Other Ambulatory Visit: Payer: Self-pay | Admitting: Physician Assistant

## 2019-01-29 DIAGNOSIS — Z981 Arthrodesis status: Secondary | ICD-10-CM

## 2019-01-29 DIAGNOSIS — M542 Cervicalgia: Secondary | ICD-10-CM

## 2019-02-01 NOTE — Telephone Encounter (Signed)
[facility] stated [facility] they haven't received Rx for traMADol (ULTRAM) 50 MG tablet that was sent on 01/28/2019 and pt is requesting the Rx be resent. Pt is scheduled for OV on 02/04/2019. Please advise. Thanks TNP

## 2019-02-02 ENCOUNTER — Telehealth: Payer: Self-pay | Admitting: Physician Assistant

## 2019-02-02 DIAGNOSIS — Z981 Arthrodesis status: Secondary | ICD-10-CM

## 2019-02-02 DIAGNOSIS — M542 Cervicalgia: Secondary | ICD-10-CM

## 2019-02-02 MED ORDER — TRAMADOL HCL 50 MG PO TABS: 50.0000 mg | ORAL_TABLET | Freq: Four times a day (QID) | ORAL | 0 refills | Status: DC | PRN

## 2019-02-02 NOTE — Telephone Encounter (Signed)
Resent

## 2019-02-02 NOTE — Telephone Encounter (Signed)
L.O.V. was on 09/18/2018 and upcoming appointment is 02/04/2019.

## 2019-02-02 NOTE — Telephone Encounter (Signed)
[facility] stated that the pharmacy did not receive the refill request that was send on 01/28/2019,please.

## 2019-02-02 NOTE — Telephone Encounter (Signed)
Pt needing his refill on: traMADol (ULTRAM) 50 MG tablet he has been without is since Sunday. Pharmacy had issues with refill requests.  Please res[facility] the [facility]ill into: [id number] [place], [place] - [address] [phone] (Phone) [phone] (Fax)    Thanks, [employer]

## 2019-02-04 ENCOUNTER — Ambulatory Visit (INDEPENDENT_AMBULATORY_CARE_PROVIDER_SITE_OTHER): Payer: BC Managed Care – PPO | Admitting: Physician Assistant

## 2019-02-04 ENCOUNTER — Encounter: Payer: Self-pay | Admitting: Physician Assistant

## 2019-02-04 ENCOUNTER — Other Ambulatory Visit: Payer: Self-pay

## 2019-02-04 VITALS — BP 118/71 | HR 57 | Temp 96.9°F | Resp 16 | Wt 292.4 lb

## 2019-02-04 DIAGNOSIS — F1190 Opioid use, unspecified, uncomplicated: Secondary | ICD-10-CM | POA: Diagnosis not present

## 2019-02-04 DIAGNOSIS — I25119 Atherosclerotic heart disease of native coronary artery with unspecified angina pectoris: Secondary | ICD-10-CM | POA: Diagnosis not present

## 2019-02-04 DIAGNOSIS — E119 Type 2 diabetes mellitus without complications: Secondary | ICD-10-CM | POA: Diagnosis not present

## 2019-02-04 DIAGNOSIS — M5030 Other cervical disc degeneration, unspecified cervical region: Secondary | ICD-10-CM

## 2019-02-04 LAB — POCT GLYCOSYLATED HEMOGLOBIN (HGB A1C): Hemoglobin A1C: 6.8 % — AB (ref 4.0–5.6)

## 2019-02-04 NOTE — Patient Instructions (Addendum)
Herniated Disk Rehab Ask your health care provider which exercises are safe for you. Do exercises exactly as told by your health care provider and adjust them as directed. It is normal to feel mild stretching, pulling, tightness, or discomfort as you do these exercises. Stop right away if you feel sudden pain or your pain gets worse. Do not begin these exercises until told by your health care provider. Stretching and range-of-motion exercises These exercises warm up your muscles and joints and improve the movement and flexibility of your back. These exercises also help to relieve pain, numbness, and tingling. Prone extension on elbows  1. Lie on your abdomen on a firm surface (prone position). 2. Prop yourself up on your elbows. 3. Use your arms to help lift your chest up until you feel a gentle stretch in your abdomen and your lower back. ? This will place some of your body weight on your elbows. If this is uncomfortable, try stacking pillows under your chest. ? Your hips should stay down, against the surface that you are lying on. Keep your hip and back muscles relaxed. 4. Hold this position for __________ seconds. 5. Slowly relax your upper body and return to the starting position. Repeat __________ times. Complete this exercise __________ times a day. Standing extension 1. Stand with your feet shoulder-width apart. 2. Place your hands on your lower back, with your palms on your back. 3. Gently arch your back (extension) and press forward with your hands. Keep your hips over your toes. Do not let your hips drift forward while arching your back. 4. Hold this position for __________ seconds. 5. Slowly return to the starting position. Repeat __________ times. Complete this exercise __________ times a day. Strengthening exercises These exercises build strength and endurance in your back. Endurance is the ability to use your muscles for a long time, even after they get tired. Pelvic tilt 1. Lie  on your back on a firm surface. Bend your knees and keep your feet flat. 2. Tense your abdominal muscles. Tip your pelvis up toward the ceiling and flatten your lower back into the floor. ? To help with this exercise, you may place a small towel under your lower back and try to push your back into the towel. 3. Hold this position for __________ seconds. 4. Let your muscles relax completely before you repeat this exercise. Repeat __________ times. Complete this exercise __________ times a day. Alternating arm and leg raises  1. Get on your hands and knees on a firm surface. If you are on a hard floor, you may want to use padding, such as an exercise mat, to cushion your knees. 2. Line up your arms and legs. Your hands should be below your shoulders, and your knees should be below your hips. 3. Lift your left leg behind you. At the same time, raise your right arm and straighten it in front of you. ? Do not lift your leg higher than your hip. ? Do not lift your arm higher than your shoulder. ? Keep your abdominal and back muscles tight. ? Keep your hips facing the ground. ? Do not arch your back. ? Keep your balance carefully, and do not hold your breath. 4. Hold this position for __________ seconds. 5. Slowly return to the starting position and repeat with your right leg and your left arm. Repeat __________ times. Complete this exercise __________ times a day. Bridge  1. Lie on your back on a firm surface with your knees bent  and your feet flat on the floor. 2. Tighten your abdominal and buttocks muscles and lift your bottom off the floor until your trunk and hips are level with your thighs. ? You should feel the muscles working in your buttocks and the back of your thighs. ? Do not arch your back. ? If this exercise is too easy, try doing it with your arms crossed over your chest. 3. Hold this position for __________ seconds. 4. Slowly lower your hips to the starting position. 5. Let your  muscles relax completely after each repetition. Repeat __________ times. Complete this exercise __________ times a day. This information is not intended to replace advice given to you by your health care provider. Make sure you discuss any questions you have with your health care provider. Document Released: 03/18/2005 Document Revised: 07/09/2018 Document Reviewed: 01/14/2018 Elsevier Patient Education  2020 [employer].  Diabetes Mellitus and Exercise Exercising regularly is important for your overall health, especially when you have diabetes (diabetes mellitus). Exercising is not only about losing weight. It has many other health benefits, such as increasing muscle strength and bone density and reducing body fat and stress. This leads to improved fitness, flexibility, and endurance, all of which result in better overall health. Exercise has additional benefits for people with diabetes, including:  Reducing appetite.  Helping to lower and control blood glucose.  Lowering blood pressure.  Helping to control amounts of fatty substances (lipids) in the blood, such as cholesterol and triglycerides.  Helping the body to respond better to insulin (improving insulin sensitivity).  Reducing how much insulin the body needs.  Decreasing the risk for heart disease by: ? Lowering cholesterol and triglyceride levels. ? Increasing the levels of good cholesterol. ? Lowering blood glucose levels. What is my activity plan? Your health care provider or certified diabetes educator can help you make a plan for the type and frequency of exercise (activity plan) that works for you. Make sure that you:  Do at least 150 minutes of moderate-intensity or vigorous-intensity exercise each week. This could be brisk walking, biking, or water aerobics. ? Do stretching and strength exercises, such as yoga or weightlifting, at least 2 times a week. ? Spread out your activity over at least 3 days of the  week.  Get some form of physical activity every day. ? Do not go more than 2 days in a row without some kind of physical activity. ? Avoid being inactive for more than 30 minutes at a time. Take frequent breaks to walk or stretch.  Choose a type of exercise or activity that you enjoy, and set realistic goals.  Start slowly, and gradually increase the intensity of your exercise over time. What do I need to know about managing my diabetes?   Check your blood glucose before and after exercising. ? If your blood glucose is 240 mg/dL (13.3 mmol/L) or higher before you exercise, check your urine for ketones. If you have ketones in your urine, do not exercise until your blood glucose returns to normal. ? If your blood glucose is 100 mg/dL (5.6 mmol/L) or lower, eat a snack containing 15-20 grams of carbohydrate. Check your blood glucose 15 minutes after the snack to make sure that your level is above 100 mg/dL (5.6 mmol/L) before you start your exercise.  Know the symptoms of low blood glucose (hypoglycemia) and how to treat it. Your risk for hypoglycemia increases during and after exercise. Common symptoms of hypoglycemia can include: ? Hunger. ? Anxiety. ?  Sweating and feeling clammy. ? Confusion. ? Dizziness or feeling light-headed. ? Increased heart rate or palpitations. ? Blurry vision. ? Tingling or numbness around the mouth, lips, or tongue. ? Tremors or shakes. ? Irritability.  Keep a rapid-acting carbohydrate snack available before, during, and after exercise to help prevent or treat hypoglycemia.  Avoid injecting insulin into areas of the body that are going to be exercised. For example, avoid injecting insulin into: ? The arms, when playing tennis. ? The legs, when jogging.  Keep records of your exercise habits. Doing this can help you and your health care provider adjust your diabetes management plan as needed. Write down: ? Food that you eat before and after you  exercise. ? Blood glucose levels before and after you exercise. ? The type and amount of exercise you have done. ? When your insulin is expected to peak, if you use insulin. Avoid exercising at times when your insulin is peaking.  When you start a new exercise or activity, work with your health care provider to make sure the activity is safe for you, and to adjust your insulin, medicines, or food intake as needed.  Drink plenty of water while you exercise to prevent dehydration or heat stroke. Drink enough fluid to keep your urine clear or pale yellow. Summary  Exercising regularly is important for your overall health, especially when you have diabetes (diabetes mellitus).  Exercising has many health benefits, such as increasing muscle strength and bone density and reducing body fat and stress.  Your health care provider or certified diabetes educator can help you make a plan for the type and frequency of exercise (activity plan) that works for you.  When you start a new exercise or activity, work with your health care provider to make sure the activity is safe for you, and to adjust your insulin, medicines, or food intake as needed. This information is not intended to replace advice given to you by your health care provider. Make sure you discuss any questions you have with your health care provider. Document Released: 06/08/2003 Document Revised: 10/10/2016 Document Reviewed: 08/28/2015 Elsevier Patient Education  2020 [employer].

## 2019-02-04 NOTE — Progress Notes (Signed)
Patient: Nathan Hull Male    DOB: [birth year]   58 y.o.   MRN: [id number] Visit Date: 02/04/2019  Today's Provider: Trinna Post, PA-C   No chief complaint on file.  Subjective:     HPI   Diabetes Mellitus Type II, Follow-up:   Lab Results  Component Value Date   HGBA1C 6.4 (A) 09/18/2018   HGBA1C 6.8 (A) 04/29/2018   HGBA1C 7.4 (A) 01/26/2018   Last seen for diabetes 5 months ago.  Management since then includes starting farxiga. He reports excellent compliance with treatment. He is not having side effects.  Current symptoms include none and have been unchanged. Home blood sugar records: patient reports he has not checked  Episodes of hypoglycemia? no   Current Insulin Regimen: not on insulin Most Recent Eye Exam: 07/01/17 Weight trend: stable Prior visit with dietician: no Current diet: in general, an "unhealthy" diet, high fat/ cholesterol Current exercise: none  ------------------------------------------------------------------------   Hypertension, follow-up:  BP Readings from Last 3 Encounters:  09/18/18 (!) 144/76  05/20/18 (!) 166/97  05/14/18 126/80    He was last seen for hypertension 5 months ago.  BP at that visit was 144/76. Management since that visit includes continue Lisinopril 10 mg daily and Metoprolol Succinate 25 mg daily .He reports good compliance with treatment. He is not having side effects.  He is not exercising. He is adherent to low salt diet.   Outside blood pressures are not being checked. He is experiencing none.  Patient denies chest pain, chest pressure/discomfort, claudication, dyspnea, exertional chest pressure/discomfort, fatigue, irregular heart beat, lower extremity edema, near-syncope, orthopnea and palpitations.   Cardiovascular risk factors include advanced age (older than 60 for men, 2 for women), diabetes mellitus, hypertension, male gender and obesity (BMI >= 30 kg/m2).  Use of agents associated with  hypertension: NSAIDS.   ------------------------------------------------------------------------    Lipid/Cholesterol, Follow-up:   Last seen for this 5 months ago.  Management since that visit includes continue Lipitor 80 MG daily.  Last Lipid Panel:    Component Value Date/Time   CHOL 134 10/29/2017 0826   TRIG 198 (H) 10/29/2017 0826   HDL 29 (L) 10/29/2017 0826   CHOLHDL 4.6 10/29/2017 0826   LDLCALC 65 10/29/2017 0826    He reports good compliance with treatment. He is not having side effects.   Wt Readings from Last 3 Encounters:  09/18/18 294 lb 6.4 oz (133.5 kg)  05/20/18 279 lb 12.8 oz (126.9 kg)  05/12/18 289 lb 2 oz (131.1 kg)   Neck Pain: Reports he was taking tylenol very frequently. He is not able to take ibuprofen due to history of CABG x 3, currently on Plavix. Tylenol at 4000 mg. Two level ACDF 6-7 years ago for ruptured disc by Dr. Trenton Gammon at [facility] which helped with severe pain. 12/10/2017 DG cervical spine showed prior disc fusion with mild upper cervical spondylosis. Was on gabapentin for a couple of months which caused weight gain when he was seeing Dr. Trenton Gammon. Has tried flexeril and robaxin which reports did not work for him. Last year visit with [facility] in 11/2017 and his discs were not bad enough to get surgery. He was previously taking 4000 mg tylenol daily but has stopped this since using tramadol.  2 beers a month. No drugs currently, marijuana when younger. His worst pain is a 3-4/10 constanly and tramadol.  ------------------------------------------------------------------------      Allergies  Allergen Reactions  . Metformin  And Related Other (See Comments)    Abdominal pain and diarrhea     Current Outpatient Medications:  .  aspirin EC 325 MG EC tablet, Take 1 tablet (325 mg total) by mouth daily., Disp: , Rfl:  .  atorvastatin (LIPITOR) 80 MG tablet, TAKE 1 TABLET BY MOUTH EVERY DAY, Disp: 90 tablet, Rfl: 3 .  Blood Glucose  Monitoring Suppl (ONE TOUCH ULTRA MINI) w/Device KIT, USE AS DIRECTED, Disp: 1 each, Rfl: 0 .  clopidogrel (PLAVIX) 75 MG tablet, Take 1 tablet (75 mg total) by mouth daily., Disp: 90 tablet, Rfl: 1 .  FARXIGA 5 MG TABS tablet, TAKE 1 TABLET BY MOUTH EVERY DAY, Disp: 90 tablet, Rfl: 0 .  ibuprofen (ADVIL,MOTRIN) 800 MG tablet, Take 1 tablet (800 mg total) by mouth every 8 (eight) hours as needed for mild pain or moderate pain., Disp: 30 tablet, Rfl: 0 .  lisinopril (PRINIVIL,ZESTRIL) 10 MG tablet, Take 10 mg by mouth daily. , Disp: , Rfl:  .  metoprolol succinate (TOPROL-XL) 100 MG 24 hr tablet, Take 100 mg by mouth daily., Disp: , Rfl: 1 .  ONE TOUCH ULTRA TEST test strip, USE AS DIRECTED, Disp: 100 each, Rfl: 1 .  ONETOUCH DELICA LANCETS FINE MISC, See admin instructions., Disp: , Rfl: 0 .  oxyCODONE-acetaminophen (PERCOCET) 10-325 MG tablet, Take 1 tablet by mouth every 6 (six) hours as needed for pain. Stop tramadol while on this medication, Disp: 20 tablet, Rfl: 0 .  traMADol (ULTRAM) 50 MG tablet, Take 1 tablet (50 mg total) by mouth every 6 (six) hours as needed for moderate pain., Disp: 28 tablet, Rfl: 0  Review of Systems  Social History   Tobacco Use  . Smoking status: Former Smoker    Packs/day: 2.00    Types: Cigarettes    Quit date: 04/23/2005    Years since quitting: 13.7  . Smokeless tobacco: Never Used  Substance Use Topics  . Alcohol use: Yes    Alcohol/week: 6.0 standard drinks    Types: 6 Cans of beer per week    Comment: half a 6 pack a month      Objective:   There were no vitals taken for this visit. There were no vitals filed for this visit.There is no height or weight on file to calculate BMI.   Physical Exam Constitutional:      Appearance: Normal appearance.  Cardiovascular:     Rate and Rhythm: Normal rate and regular rhythm.     Heart sounds: Normal heart sounds.  Pulmonary:     Effort: Pulmonary effort is normal.     Breath sounds: Normal breath  sounds.  Skin:    General: Skin is warm and dry.  Neurological:     Mental Status: He is alert and oriented to person, place, and time. Mental status is at baseline.  Psychiatric:        Mood and Affect: Mood normal.        Behavior: Behavior normal.      No results found for any visits on 02/04/19.     Assessment & Plan    1. Diabetes mellitus without complication ([place])  Controlled. Reminded to get eye exam which is overdue.  - POCT HgB A1C  2. DDD (degenerative disc disease), cervical  Patient is a 58 y/o man with history of two level ACDF and cervical DDD who is not a candidate for another surgery at this time. He has a history of CABG x 3 and is  currently on Plavix and so is not a candidate for NSAIDs. He has tried multiple muscle relaxers without success. He was previously using Tylenol until he started using tramadol. Have counseled patient extensively about risks and benefits of using narcotic pain management. Have counseled that this should be used sparingly and for severe pain. I will write for tramadol 50 mg BID and patient should supplement with 1000 mg tylenol TID. We will see him every 3 months for diabetes and pain management check. Controlled substance contract signed today. I have reviewed cervical spine xray which shows prior C5-C7 ACDF and upper cervical spondylosis. CT c-spine 2014 shows C2-3 minimal disc bulging, C3-4 disc bulging, C4-5 osteophytes and mild bulging.  - Pain Mgt Scrn (14 Drugs), Ur  3. Chronic, continuous use of opioids  - Pain Mgt Scrn (14 Drugs), Ur  I have spent 25 minutes with this patient, >50% of which was spent on counseling and coordination of care.   The entirety of the information documented in the History of Present Illness, Review of Systems and Physical Exam were personally obtained by me. Portions of this information were initially documented by Jennings Books, CMA and reviewed by me for thoroughness and accuracy.   F/u 3 months  DM and pain management        Trinna Post, PA-C  [place]

## 2019-02-06 LAB — PAIN MGT SCRN (14 DRUGS), UR
Amphetamine Scrn, Ur: NEGATIVE ng/mL
BARBITURATE SCREEN URINE: NEGATIVE ng/mL
BENZODIAZEPINE SCREEN, URINE: NEGATIVE ng/mL
Buprenorphine, Urine: NEGATIVE ng/mL
CANNABINOIDS UR QL SCN: NEGATIVE ng/mL
Cocaine (Metab) Scrn, Ur: NEGATIVE ng/mL
Creatinine(Crt), U: 133.2 mg/dL (ref 20.0–300.0)
Fentanyl, Urine: NEGATIVE pg/mL
Meperidine Screen, Urine: NEGATIVE ng/mL
Methadone Screen, Urine: NEGATIVE ng/mL
OXYCODONE+OXYMORPHONE UR QL SCN: NEGATIVE ng/mL
Opiate Scrn, Ur: NEGATIVE ng/mL
Ph of Urine: 6 (ref 4.5–8.9)
Phencyclidine Qn, Ur: NEGATIVE ng/mL
Propoxyphene Scrn, Ur: NEGATIVE ng/mL
Tramadol Screen, Urine: POSITIVE ng/mL — AB

## 2019-02-12 ENCOUNTER — Other Ambulatory Visit: Payer: Self-pay | Admitting: Physician Assistant

## 2019-02-12 DIAGNOSIS — Z981 Arthrodesis status: Secondary | ICD-10-CM

## 2019-02-12 DIAGNOSIS — M542 Cervicalgia: Secondary | ICD-10-CM

## 2019-03-14 DIAGNOSIS — Z20828 Contact with and (suspected) exposure to other viral communicable diseases: Secondary | ICD-10-CM | POA: Diagnosis not present

## 2019-03-15 ENCOUNTER — Other Ambulatory Visit: Payer: Self-pay | Admitting: Physician Assistant

## 2019-03-15 DIAGNOSIS — Z981 Arthrodesis status: Secondary | ICD-10-CM

## 2019-03-15 DIAGNOSIS — M542 Cervicalgia: Secondary | ICD-10-CM

## 2019-03-15 NOTE — Telephone Encounter (Signed)
Last filled 11/16/2

## 2019-03-15 NOTE — Telephone Encounter (Signed)
Requested medication (s) are due for refill today: yes  Requested medication (s) are on the active medication list: yes  Last refill:  02/16/2019  Future visit scheduled: yes  Notes to clinic:  refill cannot be delegated    Requested Prescriptions  Pending Prescriptions Disp Refills   traMADol (ULTRAM) 50 MG tablet [Pharmacy Med Name: TRAMADOL HCL 50 MG TABLET] 60 tablet 0    Sig: Take 1 tablet (50 mg total) by mouth 2 (two) times daily as needed for moderate pain.      Not Delegated - Analgesics:  Opioid Agonists Failed - 03/15/2019 10:19 AM      Failed - This refill cannot be delegated      Failed - Urine Drug Screen completed in last 360 days.      Passed - Valid encounter within last 6 months    Recent Outpatient Visits           1 month ago Diabetes mellitus without complication [facility]   [place], [place], PA-C   5 months ago Diabetes mellitus without complication [facility])   [place], [place], PA-C   9 months ago Diabetes mellitus without complication [facility])   [place], [place], PA-C   10 months ago Diabetes mellitus without complication [facility])   [facility] [place], [place], [place]   1 year ago External hemorrhoids   [facility] [place], [place]bie [place], [place]       Future Appointments             In 2 months Terrilee Croak, Wendee [place], PA-C [employer], [facility]

## 2019-04-15 ENCOUNTER — Other Ambulatory Visit: Payer: Self-pay | Admitting: Physician Assistant

## 2019-04-15 DIAGNOSIS — I25810 Atherosclerosis of coronary artery bypass graft(s) without angina pectoris: Secondary | ICD-10-CM

## 2019-04-15 DIAGNOSIS — M542 Cervicalgia: Secondary | ICD-10-CM

## 2019-04-15 DIAGNOSIS — Z981 Arthrodesis status: Secondary | ICD-10-CM

## 2019-04-15 NOTE — Telephone Encounter (Signed)
Requested medication (s) are on the active medication list: yes  Last refill:  03/16/19  Future visit scheduled: yes  Notes to clinic:  Unable to refill per protocol.     Requested Prescriptions  Pending Prescriptions Disp Refills   traMADol (ULTRAM) 50 MG tablet [Pharmacy Med Name: TRAMADOL HCL 50 MG TABLET] 60 tablet 0    Sig: TAKE 1 TABLET (50 MG TOTAL) BY MOUTH 2 (TWO) TIMES DAILY AS NEEDED FOR MODERATE PAIN.      Not Delegated - Analgesics:  Opioid Agonists Failed - 04/15/2019  5:44 PM      Failed - This refill cannot be delegated      Failed - Urine Drug Screen completed in last 360 days.      Passed - Valid encounter within last 6 months    Recent Outpatient Visits           2 months ago Diabetes mellitus without complication [facility])   [place], [place], PA-C   6 months ago Diabetes mellitus without complication [facility])   [place], [place], PA-C   11 months ago Diabetes mellitus without complication [facility])   [place], [place], PA-C   11 months ago Diabetes mellitus without complication [facility])   [facility] [place], Herbie [place], [place]   1 year ago External hemorrhoids   [facility] [place], Herbie [place], [place]       Future Appointments             In 1 month Pollak, Adriana M, PA-C [facility]             Signed Prescriptions Disp Refills   clopidogrel (PLAVIX) 75 MG tablet 90 tablet 0    Sig: TAKE 1 TABLET BY MOUTH EVERY DAY      Hematology: Antiplatelets - clopidogrel Failed - 04/15/2019  5:44 PM      Failed - Evaluate AST, ALT within 2 months of therapy initiation.      Failed - ALT in normal range and within 360 days    ALT  Date Value Ref Range Status  10/29/2017 22 0 - 44 IU/L Final          Failed - AST in normal range and within 360 days    AST  Date Value Ref Range Status  10/29/2017 13 0 - 40 IU/L Final          Failed -  HCT in normal range and within 180 days    HCT  Date Value Ref Range Status  05/12/2018 41.4 39.0 - 52.0 % Final          Failed - HGB in normal range and within 180 days    Hemoglobin  Date Value Ref Range Status  05/12/2018 13.4 13.0 - 17.0 g/dL Final          Failed - PLT in normal range and within 180 days    Platelets  Date Value Ref Range Status  05/12/2018 262 150 - 400 K/uL Final          Passed - Valid encounter within last 6 months    Recent Outpatient Visits           2 months ago Diabetes mellitus without complication [facility])   [place], [place], PA-C   6 months ago Diabetes mellitus without complication [facility])   [place], [place], [place]   11 months ago Diabetes mellitus  without complication [facility])   [place], [place], [place]   11 months ago Diabetes mellitus without complication [facility])   [place], [place]   1 year ago External hemorrhoids   [facility] [place], Herbie [place], [place]       Future Appointments             In 1 month Terrilee Croak, Wendee Beavers, PA-C [facility]

## 2019-04-27 ENCOUNTER — Other Ambulatory Visit: Payer: Self-pay | Admitting: Physician Assistant

## 2019-04-27 DIAGNOSIS — E119 Type 2 diabetes mellitus without complications: Secondary | ICD-10-CM

## 2019-05-17 ENCOUNTER — Other Ambulatory Visit: Payer: Self-pay | Admitting: Physician Assistant

## 2019-05-17 DIAGNOSIS — Z981 Arthrodesis status: Secondary | ICD-10-CM

## 2019-05-17 DIAGNOSIS — M542 Cervicalgia: Secondary | ICD-10-CM

## 2019-05-20 ENCOUNTER — Other Ambulatory Visit: Payer: Self-pay | Admitting: Physician Assistant

## 2019-05-20 DIAGNOSIS — M542 Cervicalgia: Secondary | ICD-10-CM

## 2019-05-20 DIAGNOSIS — Z981 Arthrodesis status: Secondary | ICD-10-CM

## 2019-05-20 NOTE — Telephone Encounter (Signed)
Patient is checking medication refill status Pt vacll back [place]

## 2019-05-20 NOTE — Telephone Encounter (Signed)
Requested medication (s) are due for refill today: yes  Requested medication (s) are on the active medication list: no  Last refill:  ?   Future visit scheduled:   Notes to clinic:  not delegated    Requested Prescriptions  Pending Prescriptions Disp Refills   traMADol (ULTRAM) 50 MG tablet [Pharmacy Med Name: TRAMADOL HCL 50 MG TABLET] 60 tablet 0    Sig: TAKE 1 TABLET (50 MG TOTAL) BY MOUTH 2 (TWO) TIMES DAILY AS NEEDED FOR MODERATE PAIN.      Not Delegated - Analgesics:  Opioid Agonists Failed - 05/20/2019  9:04 AM      Failed - This refill cannot be delegated      Failed - Urine Drug Screen completed in last 360 days.      Passed - Valid encounter within last 6 months    Recent Outpatient Visits           3 months ago Diabetes mellitus without complication [facility])   [place], [place], PA-C   8 months ago Diabetes mellitus without complication [facility])   [facility] [place], Fabio Bering M, PA-C   1 year ago Diabetes mellitus without complication [facility])   [facility] Carles Collet M, PA-C   1 year ago Diabetes mellitus without complication [facility])   [facility] [place], Herbie [place], [place]   1 year ago External hemorrhoids   [facility] [place], Herbie [place], [place]       Future Appointments             In 2 weeks Terrilee Croak, Wendee Beavers, PA-C [facility]

## 2019-06-04 ENCOUNTER — Encounter: Payer: Self-pay | Admitting: Physician Assistant

## 2019-06-04 ENCOUNTER — Other Ambulatory Visit: Payer: Self-pay

## 2019-06-04 ENCOUNTER — Ambulatory Visit (INDEPENDENT_AMBULATORY_CARE_PROVIDER_SITE_OTHER): Payer: 59 | Admitting: Physician Assistant

## 2019-06-04 VITALS — BP 138/88 | HR 79 | Temp 96.6°F | Wt 291.8 lb

## 2019-06-04 DIAGNOSIS — I25810 Atherosclerosis of coronary artery bypass graft(s) without angina pectoris: Secondary | ICD-10-CM | POA: Diagnosis not present

## 2019-06-04 DIAGNOSIS — Z114 Encounter for screening for human immunodeficiency virus [HIV]: Secondary | ICD-10-CM

## 2019-06-04 DIAGNOSIS — E119 Type 2 diabetes mellitus without complications: Secondary | ICD-10-CM | POA: Diagnosis not present

## 2019-06-04 DIAGNOSIS — I25119 Atherosclerotic heart disease of native coronary artery with unspecified angina pectoris: Secondary | ICD-10-CM

## 2019-06-04 DIAGNOSIS — Z1159 Encounter for screening for other viral diseases: Secondary | ICD-10-CM

## 2019-06-04 DIAGNOSIS — E1122 Type 2 diabetes mellitus with diabetic chronic kidney disease: Secondary | ICD-10-CM

## 2019-06-04 DIAGNOSIS — I129 Hypertensive chronic kidney disease with stage 1 through stage 4 chronic kidney disease, or unspecified chronic kidney disease: Secondary | ICD-10-CM

## 2019-06-04 DIAGNOSIS — E782 Mixed hyperlipidemia: Secondary | ICD-10-CM

## 2019-06-04 DIAGNOSIS — Z981 Arthrodesis status: Secondary | ICD-10-CM

## 2019-06-04 LAB — POCT GLYCOSYLATED HEMOGLOBIN (HGB A1C)
Est. average glucose Bld gHb Est-mCnc: 157
Hemoglobin A1C: 7.1 % — AB (ref 4.0–5.6)

## 2019-06-04 NOTE — Progress Notes (Signed)
Patient: Nathan Hull Male    DOB: 1960/12/05   59 y.o.   MRN: [id number] Visit Date: 06/04/2019  Today's Provider: Trinna Post, PA-C   Chief Complaint  Patient presents with  . Diabetes   Subjective:     HPI  Diabetes Mellitus Type II, Follow-up:   Lab Results  Component Value Date   HGBA1C 6.8 (A) 02/04/2019   HGBA1C 6.4 (A) 09/18/2018   HGBA1C 6.8 (A) 04/29/2018    Last seen for diabetes 4 months ago.  Management since then includes none. He reports good compliance with treatment. He is not having side effects.  Current symptoms include none and have been stable. Home blood sugar records: not checking at home.  Episodes of hypoglycemia? no   Current insulin regiment: Is not on insulin Most Recent Eye Exam: 4/219, he normally gets [facility] and has not yet scheduled this.  Weight trend: stable Prior visit with dietician: No Current exercise: none Current diet habits: in general, an "unhealthy" diet  Pertinent Labs:    Component Value Date/Time   CHOL 134 10/29/2017 0826   TRIG 198 (H) 10/29/2017 0826   HDL 29 (L) 10/29/2017 0826   LDLCALC 65 10/29/2017 0826   CREATININE 0.91 05/12/2018 0805    Wt Readings from Last 3 Encounters:  02/04/19 292 lb 6.4 oz (132.6 kg)  09/18/18 294 lb 6.4 oz (133.5 kg)  05/20/18 279 lb 12.8 oz (126.9 kg)   HLD: Takes lipitor 80 mg QHS without issues  Lipid Panel     Component Value Date/Time   CHOL 134 10/29/2017 0826   TRIG 198 (H) 10/29/2017 0826   HDL 29 (L) 10/29/2017 0826   CHOLHDL 4.6 10/29/2017 0826   LDLCALC 65 10/29/2017 0826   LABVLDL 40 10/29/2017 0826   HTN: Takes Lisinopril 20 mg daily and metorpolol succinate 100 mg QD   BP Readings from Last 3 Encounters:  06/04/19 138/88  02/04/19 118/71  09/18/18 (!) 144/76  Using 50 mg tramadol BID PRN. He is using this for cervical degenerative disease. He gets good relief with this regimen. Prior drug screen acceptable.    ------------------------------------------------------------------------   Allergies  Allergen Reactions  . Metformin And Related Other (See Comments)    Abdominal pain and diarrhea     Current Outpatient Medications:  .  aspirin EC 325 MG EC tablet, Take 1 tablet (325 mg total) by mouth daily., Disp: , Rfl:  .  atorvastatin (LIPITOR) 80 MG tablet, TAKE 1 TABLET BY MOUTH EVERY DAY, Disp: 90 tablet, Rfl: 3 .  Blood Glucose Monitoring Suppl (ONE TOUCH ULTRA MINI) w/Device KIT, USE AS DIRECTED, Disp: 1 each, Rfl: 0 .  clopidogrel (PLAVIX) 75 MG tablet, TAKE 1 TABLET BY MOUTH EVERY DAY, Disp: 90 tablet, Rfl: 0 .  FARXIGA 5 MG TABS tablet, TAKE 1 TABLET BY MOUTH EVERY DAY, Disp: 90 tablet, Rfl: 0 .  ibuprofen (ADVIL,MOTRIN) 800 MG tablet, Take 1 tablet (800 mg total) by mouth every 8 (eight) hours as needed for mild pain or moderate pain., Disp: 30 tablet, Rfl: 0 .  lisinopril (PRINIVIL,ZESTRIL) 10 MG tablet, Take 10 mg by mouth daily. , Disp: , Rfl:  .  lisinopril (ZESTRIL) 20 MG tablet, Take 20 mg by mouth daily., Disp: , Rfl:  .  metoprolol succinate (TOPROL-XL) 100 MG 24 hr tablet, Take 100 mg by mouth daily., Disp: , Rfl: 1 .  ONE TOUCH ULTRA TEST test strip, USE AS DIRECTED,  Disp: 100 each, Rfl: 1 .  ONETOUCH DELICA LANCETS FINE MISC, See admin instructions., Disp: , Rfl: 0 .  oxyCODONE-acetaminophen (PERCOCET) 10-325 MG tablet, Take 1 tablet by mouth every 6 (six) hours as needed for pain. Stop tramadol while on this medication, Disp: 20 tablet, Rfl: 0 .  traMADol (ULTRAM) 50 MG tablet, TAKE 1 TABLET (50 MG TOTAL) BY MOUTH 2 (TWO) TIMES DAILY AS NEEDED FOR MODERATE PAIN., Disp: 60 tablet, Rfl: 0  Review of Systems  Social History   Tobacco Use  . Smoking status: Former Smoker    Packs/day: 2.00    Types: Cigarettes    Quit date: 04/23/2005    Years since quitting: 14.1  . Smokeless tobacco: Never Used  Substance Use Topics  . Alcohol use: Yes    Alcohol/week: 6.0 standard  drinks    Types: 6 Cans of beer per week    Comment: half a 6 pack a month      Objective:   There were no vitals taken for this visit. There were no vitals filed for this visit.There is no height or weight on file to calculate BMI.   Physical Exam Constitutional:      Appearance: Normal appearance.  Cardiovascular:     Rate and Rhythm: Normal rate and regular rhythm.     Heart sounds: Normal heart sounds.  Pulmonary:     Effort: Pulmonary effort is normal.     Breath sounds: Normal breath sounds.  Skin:    General: Skin is warm and dry.  Neurological:     Mental Status: He is alert and oriented to person, place, and time. Mental status is at baseline.  Psychiatric:        Mood and Affect: Mood normal.        Behavior: Behavior normal.      No results found for any visits on 06/04/19.     Assessment & Plan    1. Diabetes mellitus without complication ([place])  Reminded to schedule eye exam with [facility] - patient expresses understanding. Declines tetanus shot today.   - POCT glycosylated hemoglobin (Hb A1C) - Comprehensive Metabolic Panel (CMET) - Lipid Profile - CBC with Differential - TSH  2. 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 Atrial fibrillation, unspecified type ([place])   4. Atherosclerosis of native coronary artery of native heart with angina pectoris ([place])   5. Encounter for screening for HIV  - HIV antibody (with reflex)  6. Encounter for hepatitis C screening test for low risk patient  - Hepatitis c antibody (reflex)  7. S/P cervical spinal fusion  Continue tramadol 50 mg BID PRN.   8. Hypertension  Continue medications  9. Hyperlipidemia  Continue medications  The entirety of the information documented in the History of Present Illness, Review of Systems and Physical Exam were personally obtained by me. Portions of this information were initially documented by [facility] and reviewed by me for thoroughness and accuracy.        Trinna Post, PA-C  [facility]

## 2019-06-04 NOTE — Patient Instructions (Signed)
Diabetes Mellitus and Exercise Exercising regularly is important for your overall health, especially when you have diabetes (diabetes mellitus). Exercising is not only about losing weight. It has many other health benefits, such as increasing muscle strength and bone density and reducing body fat and stress. This leads to improved fitness, flexibility, and endurance, all of which result in better overall health. Exercise has additional benefits for people with diabetes, including:  Reducing appetite.  Helping to lower and control blood glucose.  Lowering blood pressure.  Helping to control amounts of fatty substances (lipids) in the blood, such as cholesterol and triglycerides.  Helping the body to respond better to insulin (improving insulin sensitivity).  Reducing how much insulin the body needs.  Decreasing the risk for heart disease by: ? Lowering cholesterol and triglyceride levels. ? Increasing the levels of good cholesterol. ? Lowering blood glucose levels. What is my activity plan? Your health care provider or certified diabetes educator can help you make a plan for the type and frequency of exercise (activity plan) that works for you. Make sure that you:  Do at least 150 minutes of moderate-intensity or vigorous-intensity exercise each week. This could be brisk walking, biking, or water aerobics. ? Do stretching and strength exercises, such as yoga or weightlifting, at least 2 times a week. ? Spread out your activity over at least 3 days of the week.  Get some form of physical activity every day. ? Do not go more than 2 days in a row without some kind of physical activity. ? Avoid being inactive for more than 30 minutes at a time. Take frequent breaks to walk or stretch.  Choose a type of exercise or activity that you enjoy, and set realistic goals.  Start slowly, and gradually increase the intensity of your exercise over time. What do I need to know about managing my  diabetes?   Check your blood glucose before and after exercising. ? If your blood glucose is 240 mg/dL (13.3 mmol/L) or higher before you exercise, check your urine for ketones. If you have ketones in your urine, do not exercise until your blood glucose returns to normal. ? If your blood glucose is 100 mg/dL (5.6 mmol/L) or lower, eat a snack containing 15-20 grams of carbohydrate. Check your blood glucose 15 minutes after the snack to make sure that your level is above 100 mg/dL (5.6 mmol/L) before you start your exercise.  Know the symptoms of low blood glucose (hypoglycemia) and how to treat it. Your risk for hypoglycemia increases during and after exercise. Common symptoms of hypoglycemia can include: ? Hunger. ? Anxiety. ? Sweating and feeling clammy. ? Confusion. ? Dizziness or feeling light-headed. ? Increased heart rate or palpitations. ? Blurry vision. ? Tingling or numbness around the mouth, lips, or tongue. ? Tremors or shakes. ? Irritability.  Keep a rapid-acting carbohydrate snack available before, during, and after exercise to help prevent or treat hypoglycemia.  Avoid injecting insulin into areas of the body that are going to be exercised. For example, avoid injecting insulin into: ? The arms, when playing tennis. ? The legs, when jogging.  Keep records of your exercise habits. Doing this can help you and your health care provider adjust your diabetes management plan as needed. Write down: ? Food that you eat before and after you exercise. ? Blood glucose levels before and after you exercise. ? The type and amount of exercise you have done. ? When your insulin is expected to peak, if you use   insulin. Avoid exercising at times when your insulin is peaking.  When you start a new exercise or activity, work with your health care provider to make sure the activity is safe for you, and to adjust your insulin, medicines, or food intake as needed.  Drink plenty of water while  you exercise to prevent dehydration or heat stroke. Drink enough fluid to keep your urine clear or pale yellow. Summary  Exercising regularly is important for your overall health, especially when you have diabetes (diabetes mellitus).  Exercising has many health benefits, such as increasing muscle strength and bone density and reducing body fat and stress.  Your health care provider or certified diabetes educator can help you make a plan for the type and frequency of exercise (activity plan) that works for you.  When you start a new exercise or activity, work with your health care provider to make sure the activity is safe for you, and to adjust your insulin, medicines, or food intake as needed. This information is not intended to replace advice given to you by your health care provider. Make sure you discuss any questions you have with your health care provider. Document Revised: 10/10/2016 Document Reviewed: 08/28/2015 Elsevier Patient Education  2020 Elsevier Inc.  

## 2019-06-11 LAB — CBC WITH DIFFERENTIAL/PLATELET
Basophils Absolute: 0.1 10*3/uL (ref 0.0–0.2)
Basos: 1 %
EOS (ABSOLUTE): 0.1 10*3/uL (ref 0.0–0.4)
Eos: 2 %
Hematocrit: 43.2 % (ref 37.5–51.0)
Hemoglobin: 14.2 g/dL (ref 13.0–17.7)
Immature Grans (Abs): 0 10*3/uL (ref 0.0–0.1)
Immature Granulocytes: 0 %
Lymphocytes Absolute: 2 10*3/uL (ref 0.7–3.1)
Lymphs: 33 %
MCH: 29.2 pg (ref 26.6–33.0)
MCHC: 32.9 g/dL (ref 31.5–35.7)
MCV: 89 fL (ref 79–97)
Monocytes Absolute: 0.5 10*3/uL (ref 0.1–0.9)
Monocytes: 8 %
Neutrophils Absolute: 3.4 10*3/uL (ref 1.4–7.0)
Neutrophils: 56 %
Platelets: 234 10*3/uL (ref 150–450)
RBC: 4.87 x10E6/uL (ref 4.14–5.80)
RDW: 12.5 % (ref 11.6–15.4)
WBC: 6.2 10*3/uL (ref 3.4–10.8)

## 2019-06-11 LAB — COMPREHENSIVE METABOLIC PANEL
ALT: 17 IU/L (ref 0–44)
AST: 15 IU/L (ref 0–40)
Albumin/Globulin Ratio: 1.9 (ref 1.2–2.2)
Albumin: 4.3 g/dL (ref 3.8–4.9)
Alkaline Phosphatase: 91 IU/L (ref 39–117)
BUN/Creatinine Ratio: 16 (ref 9–20)
BUN: 15 mg/dL (ref 6–24)
Bilirubin Total: 0.3 mg/dL (ref 0.0–1.2)
CO2: 23 mmol/L (ref 20–29)
Calcium: 9.2 mg/dL (ref 8.7–10.2)
Chloride: 100 mmol/L (ref 96–106)
Creatinine, Ser: 0.94 mg/dL (ref 0.76–1.27)
GFR calc Af Amer: 104 mL/min/{1.73_m2} (ref >59)
GFR calc non Af Amer: 90 mL/min/{1.73_m2} (ref >59)
Globulin, Total: 2.3 g/dL (ref 1.5–4.5)
Glucose: 148 mg/dL — ABNORMAL HIGH (ref 65–99)
Potassium: 4.8 mmol/L (ref 3.5–5.2)
Sodium: 138 mmol/L (ref 134–144)
Total Protein: 6.6 g/dL (ref 6.0–8.5)

## 2019-06-11 LAB — HIV ANTIBODY (ROUTINE TESTING W REFLEX): HIV Screen 4th Generation wRfx: NONREACTIVE

## 2019-06-11 LAB — TSH: TSH: 1.23 u[IU]/mL (ref 0.450–4.500)

## 2019-06-11 LAB — HCV COMMENT:

## 2019-06-11 LAB — LIPID PANEL
Chol/HDL Ratio: 4.8 ratio (ref 0.0–5.0)
Cholesterol, Total: 139 mg/dL (ref 100–199)
HDL: 29 mg/dL — ABNORMAL LOW (ref >39)
LDL Chol Calc (NIH): 79 mg/dL (ref 0–99)
Triglycerides: 178 mg/dL — ABNORMAL HIGH (ref 0–149)
VLDL Cholesterol Cal: 31 mg/dL (ref 5–40)

## 2019-06-11 LAB — HEPATITIS C ANTIBODY (REFLEX): HCV Ab: <0.1 s/co ratio (ref 0.0–0.9)

## 2019-06-14 ENCOUNTER — Other Ambulatory Visit: Payer: Self-pay | Admitting: Physician Assistant

## 2019-06-14 DIAGNOSIS — M542 Cervicalgia: Secondary | ICD-10-CM

## 2019-06-14 DIAGNOSIS — Z981 Arthrodesis status: Secondary | ICD-10-CM

## 2019-07-15 ENCOUNTER — Other Ambulatory Visit: Payer: Self-pay | Admitting: Physician Assistant

## 2019-07-15 DIAGNOSIS — M542 Cervicalgia: Secondary | ICD-10-CM

## 2019-07-15 DIAGNOSIS — Z981 Arthrodesis status: Secondary | ICD-10-CM

## 2019-07-15 NOTE — Telephone Encounter (Signed)
Requested medication (s) are due for refill today: yes  Requested medication (s) are on the active medication list: yes  Last refill:  06/17/19  Future visit scheduled: yes  Notes to clinic:  not delegated    Requested Prescriptions  Pending Prescriptions Disp Refills   traMADol (ULTRAM) 50 MG tablet [Pharmacy Med Name: TRAMADOL HCL 50 MG TABLET] 60 tablet 0    Sig: TAKE 1 TABLET (50 MG TOTAL) BY MOUTH 2 (TWO) TIMES DAILY AS NEEDED FOR MODERATE PAIN.      Not Delegated - Analgesics:  Opioid Agonists Failed - 07/15/2019  9:30 AM      Failed - This refill cannot be delegated      Failed - Urine Drug Screen completed in last 360 days.      Passed - Valid encounter within last 6 months    Recent Outpatient Visits           1 month ago Diabetes mellitus without complication [facility])   [place], [place], PA-C   5 months ago Diabetes mellitus without complication [facility])   [place], [place], PA-C   10 months ago Diabetes mellitus without complication [facility])   [facility] [place], Fabio Bering M, PA-C   1 year ago Diabetes mellitus without complication [facility])   [facility] Carles Collet M, PA-C   1 year ago Diabetes mellitus without complication [facility])   [facility] [place], Herbie [place], [place]       Future Appointments             In 2 months Terrilee Croak, Wendee Beavers, PA-C [facility]

## 2019-08-13 ENCOUNTER — Other Ambulatory Visit: Payer: Self-pay | Admitting: Physician Assistant

## 2019-08-13 DIAGNOSIS — Z981 Arthrodesis status: Secondary | ICD-10-CM

## 2019-08-13 DIAGNOSIS — M542 Cervicalgia: Secondary | ICD-10-CM

## 2019-08-13 NOTE — Telephone Encounter (Signed)
Requested medication (s) are due for refill today:yes  Requested medication (s) are on the active medication list: yes  Last refill:  07/15/2019  Future visit scheduled: yes  Notes to clinic:  this refill cannot be delegated   Requested Prescriptions  Pending Prescriptions Disp Refills   traMADol (ULTRAM) 50 MG tablet [Pharmacy Med Name: TRAMADOL HCL 50 MG TABLET] 60 tablet 0    Sig: TAKE 1 TABLET BY MOUTH TWICE A DAY AS NEEDED FOR MODERATE PAIN      Not Delegated - Analgesics:  Opioid Agonists Failed - 08/13/2019  9:25 AM      Failed - This refill cannot be delegated      Failed - Urine Drug Screen completed in last 360 days.      Passed - Valid encounter within last 6 months    Recent Outpatient Visits           2 months ago Diabetes mellitus without complication [facility])   [place], [place], PA-C   6 months ago Diabetes mellitus without complication [facility])   [place], [place], PA-C   10 months ago Diabetes mellitus without complication [facility])   [facility] [place], Fabio Bering M, PA-C   1 year ago Diabetes mellitus without complication [facility])   [facility] [place], Fabio Bering M, PA-C   1 year ago Diabetes mellitus without complication [facility])   [facility] [place], Herbie [place], [place]       Future Appointments             In 1 month Terrilee Croak, Wendee Beavers, PA-C [facility]

## 2019-09-05 ENCOUNTER — Other Ambulatory Visit: Payer: Self-pay | Admitting: Physician Assistant

## 2019-09-05 DIAGNOSIS — I25810 Atherosclerosis of coronary artery bypass graft(s) without angina pectoris: Secondary | ICD-10-CM

## 2019-09-05 NOTE — Telephone Encounter (Signed)
Requested Prescriptions  Pending Prescriptions Disp Refills  . clopidogrel (PLAVIX) 75 MG tablet [Pharmacy Med Name: CLOPIDOGREL 75 MG TABLET] 90 tablet 0    Sig: TAKE 1 TABLET BY MOUTH EVERY DAY     Hematology: Antiplatelets - clopidogrel Failed - 09/05/2019  9:38 AM      Failed - Evaluate AST, ALT within 2 months of therapy initiation.      Passed - ALT in normal range and within 360 days    ALT  Date Value Ref Range Status  06/10/2019 17 0 - 44 IU/L Final         Passed - AST in normal range and within 360 days    AST  Date Value Ref Range Status  06/10/2019 15 0 - 40 IU/L Final         Passed - HCT in normal range and within 180 days    Hematocrit  Date Value Ref Range Status  06/10/2019 43.2 37.5 - 51.0 % Final         Passed - HGB in normal range and within 180 days    Hemoglobin  Date Value Ref Range Status  06/10/2019 14.2 13.0 - 17.7 g/dL Final         Passed - PLT in normal range and within 180 days    Platelets  Date Value Ref Range Status  06/10/2019 234 150 - 450 x10E3/uL Final         Passed - Valid encounter within last 6 months    Recent Outpatient Visits          3 months ago Diabetes mellitus without complication [facility])   [place], Adriana M, PA-C   7 months ago [facility])   [place], [place], PA-C   11 months ago Diabetes mellitus without complication [facility])   [facility] [place], [place], PA-C   1 year ago Diabetes mellitus without complication [facility])   [facility] [place], [place], PA-C   1 year ago Diabetes mellitus without complication [facility])   [facility] [place], Herbie [place], [place]      Future Appointments            In 1 month Terrilee Croak, Wendee Beavers, PA-C [facility]

## 2019-09-10 ENCOUNTER — Other Ambulatory Visit: Payer: Self-pay | Admitting: Physician Assistant

## 2019-09-10 DIAGNOSIS — M542 Cervicalgia: Secondary | ICD-10-CM

## 2019-09-10 DIAGNOSIS — Z981 Arthrodesis status: Secondary | ICD-10-CM

## 2019-09-10 NOTE — Telephone Encounter (Signed)
Requested medication (s) are due for refill today: yes  Requested medication (s) are on the active medication list: yes  Last refill:  08/13/19  Future visit scheduled: yes  Notes to clinic:  Refill not delegated per protocol    Requested Prescriptions  Pending Prescriptions Disp Refills   traMADol (ULTRAM) 50 MG tablet [Pharmacy Med Name: TRAMADOL HCL 50 MG TABLET] 60 tablet 0    Sig: TAKE 1 TABLET BY MOUTH TWICE A DAY AS NEEDED FOR MODERATE PAIN      Not Delegated - Analgesics:  Opioid Agonists Failed - 09/10/2019 12:46 PM      Failed - This refill cannot be delegated      Failed - Urine Drug Screen completed in last 360 days.      Passed - Valid encounter within last 6 months    Recent Outpatient Visits           3 months ago Diabetes mellitus without complication [facility])   [place], [place], PA-C   7 months ago Diabetes mellitus without complication [facility])   [place], [place], PA-C   11 months ago Diabetes mellitus without complication [facility])   [facility] [place], Fabio Bering M, PA-C   1 year ago Diabetes mellitus without complication [facility])   [facility] Carles Collet M, PA-C   1 year ago Diabetes mellitus without complication [facility])   [facility] [place], Herbie [place], [place]       Future Appointments             In 4 weeks Terrilee Croak, Wendee Beavers, PA-C [employer]

## 2019-09-29 ENCOUNTER — Other Ambulatory Visit: Payer: Self-pay | Admitting: Physician Assistant

## 2019-09-29 DIAGNOSIS — E119 Type 2 diabetes mellitus without complications: Secondary | ICD-10-CM

## 2019-10-07 NOTE — Progress Notes (Signed)
Established patient visit   Patient: Nathan Hull   DOB: [birth year]   59 y.o. Male  MRN: [id number] Visit Date: 10/08/2019  Today's healthcare provider: Trinna Post, PA-C   Chief Complaint  Patient presents with  . Diabetes   Subjective    HPI  Diabetes Mellitus Type II, Follow-up  Lab Results  Component Value Date   HGBA1C 7.2 (A) 10/08/2019   HGBA1C 7.1 (A) 06/04/2019   HGBA1C 6.8 (A) 02/04/2019   Wt Readings from Last 3 Encounters:  10/08/19 287 lb (130.2 kg)  06/04/19 291 lb 12.8 oz (132.4 kg)  02/04/19 292 lb 6.4 oz (132.6 kg)   Last seen for diabetes 4 months ago.  Management since then includes no changes. He reports good compliance with treatment. He is not having side effects.  Symptoms: No fatigue No foot ulcerations  No appetite changes No nausea  No paresthesia of the feet  No polydipsia  No polyuria No visual disturbances   No vomiting     Home blood sugar records: fasting range: 120s  Episodes of hypoglycemia? No    Current insulin regiment: none Most Recent Eye Exam: 07/2019 Current exercise: none Current diet habits: well balanced  Pertinent Labs: Lab Results  Component Value Date   CHOL 139 06/10/2019   HDL 29 (L) 06/10/2019   LDLCALC 79 06/10/2019   TRIG 178 (H) 06/10/2019   CHOLHDL 4.8 06/10/2019   Lab Results  Component Value Date   NA 138 06/10/2019   K 4.8 06/10/2019   CREATININE 0.94 06/10/2019   GFRNONAA 90 06/10/2019   GFRAA 104 06/10/2019   GLUCOSE 148 (H) 06/10/2019     Hypertension, follow-up  BP Readings from Last 3 Encounters:  10/08/19 134/76  06/04/19 138/88  02/04/19 118/71   Wt Readings from Last 3 Encounters:  10/08/19 287 lb (130.2 kg)  06/04/19 291 lb 12.8 oz (132.4 kg)  02/04/19 292 lb 6.4 oz (132.6 kg)     He was last seen for hypertension 3 months ago.  BP at that visit was normal. Management since that visit includes continue Lisinopril and metoprolol .  He reports excellent  compliance with treatment. He is not having side effects.  He is following a Regular diet. He is not exercising. He does not smoke.  Use of agents associated with hypertension: none.   Outside blood pressures are normal. Symptoms: No chest pain No chest pressure  No palpitations No syncope  No dyspnea No orthopnea  No paroxysmal nocturnal dyspnea No lower extremity edema   Pertinent labs: Lab Results  Component Value Date   CHOL 139 06/10/2019   HDL 29 (L) 06/10/2019   LDLCALC 79 06/10/2019   TRIG 178 (H) 06/10/2019   CHOLHDL 4.8 06/10/2019   Lab Results  Component Value Date   NA 138 06/10/2019   K 4.8 06/10/2019   CREATININE 0.94 06/10/2019   GFRNONAA 90 06/10/2019   GFRAA 104 06/10/2019   GLUCOSE 148 (H) 06/10/2019     The 10-year ASCVD risk score Mikey Bussing DC Jr., et al., 2013) is: 16.6%   ---------------------------------------------------------------------------------------------------  Lipid/Cholesterol, Follow-up  Last lipid panel Other pertinent labs  Lab Results  Component Value Date   CHOL 139 06/10/2019   HDL 29 (L) 06/10/2019   LDLCALC 79 06/10/2019   TRIG 178 (H) 06/10/2019   CHOLHDL 4.8 06/10/2019   Lab Results  Component Value Date   ALT 17 06/10/2019   AST 15 06/10/2019   PLT 234 06/10/2019  TSH 1.230 06/10/2019     He was last seen for this 4 months ago.  Management since that visit includes continue statin.  He reports excellent compliance with treatment. He is not having side effects.   Symptoms: No chest pain No chest pressure/discomfort  No dyspnea No lower extremity edema  No numbness or tingling of extremity No orthopnea  No palpitations No paroxysmal nocturnal dyspnea  No speech difficulty No syncope   Current diet: not asked Current exercise: none  The 10-year ASCVD risk score Mikey Bussing DC Jr., et al., 2013) is:  16.6%  ---------------------------------------------------------------------------------------------------   Patient reports that he has been experiencing muscle cramps. He reports that it is worse at night. He takes a teaspoon of mustard to get temporary relief from this. He is requesting something to help with symptoms.   Chronic Neck Pain: Using 50 mg tramadol BID PRN. He is using this for cervical degenerative disease. He gets good relief with this regimen. Prior drug screen acceptable. He is able to perform his work functions using this medication.      Medications: Outpatient Medications Prior to Visit  Medication Sig  . aspirin EC 325 MG EC tablet Take 1 tablet (325 mg total) by mouth daily.  . Blood Glucose Monitoring Suppl (ONE TOUCH ULTRA MINI) w/Device KIT USE AS DIRECTED  . clopidogrel (PLAVIX) 75 MG tablet TAKE 1 TABLET BY MOUTH EVERY DAY  . FARXIGA 5 MG TABS tablet TAKE 1 TABLET BY MOUTH EVERY DAY  . ibuprofen (ADVIL,MOTRIN) 800 MG tablet Take 1 tablet (800 mg total) by mouth every 8 (eight) hours as needed for mild pain or moderate pain.  Marland Kitchen lisinopril (ZESTRIL) 20 MG tablet Take 20 mg by mouth daily.  . metoprolol succinate (TOPROL-XL) 100 MG 24 hr tablet Take 100 mg by mouth daily.  . ONE TOUCH ULTRA TEST test strip USE AS DIRECTED  . ONETOUCH DELICA LANCETS FINE MISC See admin instructions.  Marland Kitchen oxyCODONE-acetaminophen (PERCOCET) 10-325 MG tablet Take 1 tablet by mouth every 6 (six) hours as needed for pain. Stop tramadol while on this medication  . traMADol (ULTRAM) 50 MG tablet TAKE 1 TABLET BY MOUTH TWICE A DAY AS NEEDED FOR MODERATE PAIN  . [DISCONTINUED] atorvastatin (LIPITOR) 80 MG tablet TAKE 1 TABLET BY MOUTH EVERY DAY   No facility-administered medications prior to visit.    Review of Systems  Constitutional: Negative.   Respiratory: Negative.   Cardiovascular: Negative.   Gastrointestinal: Negative.   Endocrine: Negative.   Musculoskeletal: Negative.    Skin: Negative.   Neurological: Negative.       Objective    BP 134/76   Pulse 80   Temp 97.8 F (36.6 C)   Wt 287 lb (130.2 kg)   BMI 38.92 kg/m    Physical Exam Constitutional:      Appearance: He is obese.  Cardiovascular:     Rate and Rhythm: Normal rate and regular rhythm.     Pulses: Normal pulses.     Heart sounds: Normal heart sounds.  Pulmonary:     Effort: Pulmonary effort is normal.     Breath sounds: Normal breath sounds.  Skin:    General: Skin is warm and dry.  Neurological:     Mental Status: He is alert and oriented to person, place, and time. Mental status is at baseline.  Psychiatric:        Mood and Affect: Mood normal.        Behavior: Behavior normal.  Results for orders placed or performed in visit on 10/08/19  POCT glycosylated hemoglobin (Hb A1C)  Result Value Ref Range   Hemoglobin A1C 7.2 (A) 4.0 - 5.6 %   HbA1c POC (<> result, manual entry)     HbA1c, POC (prediabetic range)     HbA1c, POC (controlled diabetic range)      Assessment & Plan    1. Diabetes mellitus without complication ([place])  Slightly above goal at 7.2% May need to increase farxiga at next visit. Continue current medications UTD on vaccines, eye exam, foot exam On ACEi On Statin Discussed diet and exercise F/u in 4 months  - POCT glycosylated hemoglobin (Hb A1C)  2. Mixed hyperlipidemia  Change lipitor to crestor 2/2 muscle cramps.   - rosuvastatin (CRESTOR) 20 MG tablet; Take 1 tablet (20 mg total) by mouth daily.  Dispense: 90 tablet; Refill: 1  3. Muscle cramping   4. S/P cervical spinal fusion  Continue tramadol PRN. - traMADol (ULTRAM) 50 MG tablet; TAKE 1 TABLET BY MOUTH TWICE A DAY AS NEEDED FOR MODERATE PAIN  Dispense: 60 tablet; Refill: 0  5. Neck pain  - traMADol (ULTRAM) 50 MG tablet; TAKE 1 TABLET BY MOUTH TWICE A DAY AS NEEDED FOR MODERATE PAIN  Dispense: 60 tablet; Refill: 0  6. Atrial fibrillation, unspecified type ([place])   7.  Hypertension associated with diabetes ([place])    Return in about 4 months (around 02/08/2020).      ITrinna Post, PA-C, have reviewed all documentation for this visit. The documentation on 10/13/19 for the exam, diagnosis, procedures, and orders are all accurate and complete.    Paulene Floor  [facility] [phone] (phone) [phone] (fax)  [place]

## 2019-10-08 ENCOUNTER — Encounter: Payer: Self-pay | Admitting: Physician Assistant

## 2019-10-08 ENCOUNTER — Ambulatory Visit: Payer: 59 | Admitting: Physician Assistant

## 2019-10-08 ENCOUNTER — Other Ambulatory Visit: Payer: Self-pay

## 2019-10-08 VITALS — BP 134/76 | HR 80 | Temp 97.8°F | Wt 287.0 lb

## 2019-10-08 DIAGNOSIS — Z981 Arthrodesis status: Secondary | ICD-10-CM

## 2019-10-08 DIAGNOSIS — R252 Cramp and spasm: Secondary | ICD-10-CM | POA: Diagnosis not present

## 2019-10-08 DIAGNOSIS — E119 Type 2 diabetes mellitus without complications: Secondary | ICD-10-CM | POA: Diagnosis not present

## 2019-10-08 DIAGNOSIS — M542 Cervicalgia: Secondary | ICD-10-CM

## 2019-10-08 DIAGNOSIS — E782 Mixed hyperlipidemia: Secondary | ICD-10-CM

## 2019-10-08 DIAGNOSIS — I10 Essential (primary) hypertension: Secondary | ICD-10-CM

## 2019-10-08 DIAGNOSIS — E1159 Type 2 diabetes mellitus with other circulatory complications: Secondary | ICD-10-CM

## 2019-10-08 DIAGNOSIS — I152 Hypertension secondary to endocrine disorders: Secondary | ICD-10-CM

## 2019-10-08 DIAGNOSIS — I4891 Unspecified atrial fibrillation: Secondary | ICD-10-CM

## 2019-10-08 LAB — POCT GLYCOSYLATED HEMOGLOBIN (HGB A1C): Hemoglobin A1C: 7.2 % — AB (ref 4.0–5.6)

## 2019-10-08 MED ORDER — TRAMADOL HCL 50 MG PO TABS: ORAL_TABLET | ORAL | 0 refills | Status: DC

## 2019-10-08 MED ORDER — ROSUVASTATIN CALCIUM 20 MG PO TABS: 20.0000 mg | ORAL_TABLET | Freq: Every day | ORAL | 1 refills | Status: DC

## 2019-11-09 ENCOUNTER — Telehealth: Payer: Self-pay | Admitting: Physician Assistant

## 2019-11-09 DIAGNOSIS — M542 Cervicalgia: Secondary | ICD-10-CM

## 2019-11-09 DIAGNOSIS — Z981 Arthrodesis status: Secondary | ICD-10-CM

## 2019-11-09 NOTE — Telephone Encounter (Signed)
Requested medication (s) are due for refill today - yes  Requested medication (s) are on the active medication list -yes  Future visit scheduled -yes  Last refill: 10/10/19  Notes to clinic: Request for non delegated Rx  Requested Prescriptions  Pending Prescriptions Disp Refills   traMADol (ULTRAM) 50 MG tablet [Pharmacy Med Name: TRAMADOL HCL 50 MG TABLET] 60 tablet 0    Sig: TAKE 1 TABLET BY MOUTH TWICE A DAY AS NEEDED FOR MODERATE PAIN      Not Delegated - Analgesics:  Opioid Agonists Failed - 11/09/2019 10:56 AM      Failed - This refill cannot be delegated      Failed - Urine Drug Screen completed in last 360 days.      Passed - Valid encounter within last 6 months    Recent Outpatient Visits           1 month ago Diabetes mellitus without complication [facility])   [place], [place], PA-C   5 months ago Diabetes mellitus without complication [facility])   [place], [place], PA-C   9 months ago Diabetes mellitus without complication [facility])   [facility] [place], Fabio Bering M, PA-C   1 year ago Diabetes mellitus without complication [facility])   [facility] , Fabio Bering M, PA-C   1 year ago Diabetes mellitus without complication [facility])   [facility] Trinna Post, PA-C       Future Appointments             In 3 months Terrilee Croak, Wendee Beavers, PA-C [employer], [facility]                Requested Prescriptions  Pending Prescriptions Disp Refills   traMADol (ULTRAM) 50 MG tablet [Pharmacy Med Name: TRAMADOL HCL 50 MG TABLET] 60 tablet 0    Sig: TAKE 1 TABLET BY MOUTH TWICE A DAY AS NEEDED FOR MODERATE PAIN      Not Delegated - Analgesics:  Opioid Agonists Failed - 11/09/2019 10:56 AM      Failed - This refill cannot be delegated      Failed - Urine Drug Screen completed in last 360 days.      Passed - Valid encounter within last 6 months    Recent Outpatient Visits            1 month ago Diabetes mellitus without complication [facility])   [place], [place], PA-C   5 months ago Diabetes mellitus without complication [facility])   [place], [place], PA-C   9 months ago Diabetes mellitus without complication [facility])   [facility] [place], Fabio Bering M, PA-C   1 year ago Diabetes mellitus without complication [facility])   [facility] [place], Fabio Bering M, PA-C   1 year ago Diabetes mellitus without complication [facility])   [facility] Trinna Post, PA-C       Future Appointments             In 3 months Terrilee Croak, Wendee Beavers, PA-C [employer], [place]

## 2019-11-11 ENCOUNTER — Other Ambulatory Visit: Payer: Self-pay | Admitting: Physician Assistant

## 2019-11-11 DIAGNOSIS — M542 Cervicalgia: Secondary | ICD-10-CM

## 2019-11-11 DIAGNOSIS — Z981 Arthrodesis status: Secondary | ICD-10-CM

## 2019-11-11 NOTE — Telephone Encounter (Signed)
L.O.V. was on 10/08/2019 and upcoming appointment is on 02/11/2020. Medication refill send into pharmacy.

## 2019-11-11 NOTE — Telephone Encounter (Signed)
I tried to send medication into pharmacy but it printed. Didn't realize the medication.

## 2019-11-11 NOTE — Telephone Encounter (Signed)
Requested medication (s) are due for refill today: yes  Requested medication (s) are on the active medication list: yes  Last refill: 10/10/2019  Future visit scheduled: yes  Notes to clinic:  Looks like this script was printed  Please resend     Requested Prescriptions  Pending Prescriptions Disp Refills   traMADol (ULTRAM) 50 MG tablet [Pharmacy Med Name: TRAMADOL HCL 50 MG TABLET] 60 tablet 0    Sig: TAKE 1 TABLET BY MOUTH TWICE A DAY AS NEEDED FOR MODERATE PAIN      Not Delegated - Analgesics:  Opioid Agonists Failed - 11/11/2019 10:34 AM      Failed - This refill cannot be delegated      Failed - Urine Drug Screen completed in last 360 days.      Passed - Valid encounter within last 6 months    Recent Outpatient Visits           1 month ago Diabetes mellitus without complication [facility])   [place], [place], PA-C   5 months ago Diabetes mellitus without complication [facility])   [place], [place], PA-C   9 months ago Diabetes mellitus without complication [facility])   [facility] [place], Fabio Bering M, PA-C   1 year ago Diabetes mellitus without complication [facility])   [facility] [place], Fabio Bering M, PA-C   1 year ago Diabetes mellitus without complication [facility])   [facility] Trinna Post, PA-C       Future Appointments             In 3 months Terrilee Croak, Wendee Beavers, PA-C [employer], [place]

## 2019-12-05 ENCOUNTER — Other Ambulatory Visit: Payer: Self-pay | Admitting: Physician Assistant

## 2019-12-05 DIAGNOSIS — I25810 Atherosclerosis of coronary artery bypass graft(s) without angina pectoris: Secondary | ICD-10-CM

## 2019-12-05 NOTE — Telephone Encounter (Signed)
Requested Prescriptions  Pending Prescriptions Disp Refills  . clopidogrel (PLAVIX) 75 MG tablet [Pharmacy Med Name: CLOPIDOGREL 75 MG TABLET] 90 tablet 0    Sig: TAKE 1 TABLET BY MOUTH EVERY DAY     Hematology: Antiplatelets - clopidogrel Failed - 12/05/2019  9:11 AM      Failed - Evaluate AST, ALT within 2 months of therapy initiation.      Passed - ALT in normal range and within 360 days    ALT  Date Value Ref Range Status  06/10/2019 17 0 - 44 IU/L Final         Passed - AST in normal range and within 360 days    AST  Date Value Ref Range Status  06/10/2019 15 0 - 40 IU/L Final         Passed - HCT in normal range and within 180 days    Hematocrit  Date Value Ref Range Status  06/10/2019 43.2 37.5 - 51.0 % Final         Passed - HGB in normal range and within 180 days    Hemoglobin  Date Value Ref Range Status  06/10/2019 14.2 13.0 - 17.7 g/dL Final         Passed - PLT in normal range and within 180 days    Platelets  Date Value Ref Range Status  06/10/2019 234 150 - 450 x10E3/uL Final         Passed - Valid encounter within last 6 months    Recent Outpatient Visits          1 month ago Diabetes mellitus without complication [facility])   [place], [place], PA-C   6 months ago Diabetes mellitus without complication [facility])   [place], [place], PA-C   10 months ago Diabetes mellitus without complication [facility])   [facility] [place], [place], PA-C   1 year ago Diabetes mellitus without complication [facility])   [facility] [place], [place], PA-C   1 year ago Diabetes mellitus without complication [facility])   [facility] Trinna Post, [place]      Future Appointments            In 2 months Trinna Post, PA-C [employer], [facility]

## 2019-12-08 ENCOUNTER — Other Ambulatory Visit: Payer: Self-pay | Admitting: Physician Assistant

## 2019-12-08 DIAGNOSIS — Z981 Arthrodesis status: Secondary | ICD-10-CM

## 2019-12-08 DIAGNOSIS — M542 Cervicalgia: Secondary | ICD-10-CM

## 2019-12-08 NOTE — Telephone Encounter (Signed)
Requested medication (s) are due for refill today: yes  Requested medication (s) are on the active medication list: yes  Last refill:  11/11/19 #60  Future visit scheduled: yes  Notes to clinic:  Please review for refill. Refill not delegated per protocol    Requested Prescriptions  Pending Prescriptions Disp Refills   traMADol (ULTRAM) 50 MG tablet [Pharmacy Med Name: TRAMADOL HCL 50 MG TABLET] 60 tablet 0    Sig: TAKE 1 TABLET BY MOUTH TWICE A DAY AS NEEDED FOR MODERATE PAIN      Not Delegated - Analgesics:  Opioid Agonists Failed - 12/08/2019  3:55 PM      Failed - This refill cannot be delegated      Failed - Urine Drug Screen completed in last 360 days.      Passed - Valid encounter within last 6 months    Recent Outpatient Visits           2 months ago Diabetes mellitus without complication [facility])   [place], [place], PA-C   6 months ago Diabetes mellitus without complication [facility])   [place], [place], PA-C   10 months ago Diabetes mellitus without complication [facility])   [facility] [place], Fabio Bering M, PA-C   1 year ago Diabetes mellitus without complication [facility])   [facility] [place], Fabio Bering M, PA-C   1 year ago Diabetes mellitus without complication [facility])   [facility] Trinna Post, PA-C       Future Appointments             In 2 months Trinna Post, PA-C [employer], [facility]

## 2019-12-30 ENCOUNTER — Other Ambulatory Visit: Payer: Self-pay | Admitting: Physician Assistant

## 2019-12-30 DIAGNOSIS — E119 Type 2 diabetes mellitus without complications: Secondary | ICD-10-CM

## 2019-12-30 NOTE — Telephone Encounter (Signed)
Requested Prescriptions  Pending Prescriptions Disp Refills  . FARXIGA 5 MG TABS tablet [Pharmacy Med Name: FARXIGA 5 MG TABLET] 90 tablet 0    Sig: TAKE 1 TABLET BY MOUTH EVERY DAY     Endocrinology:  Diabetes - SGLT2 Inhibitors Failed - 12/30/2019  1:24 AM      Failed - LDL in normal range and within 360 days    LDL Chol Calc (NIH)  Date Value Ref Range Status  06/10/2019 79 0 - 99 mg/dL Final         Passed - Cr in normal range and within 360 days    Creatinine, Ser  Date Value Ref Range Status  06/10/2019 0.94 0.76 - 1.27 mg/dL Final         Passed - HBA1C is between 0 and 7.9 and within 180 days    Hemoglobin A1C  Date Value Ref Range Status  10/08/2019 7.2 (A) 4.0 - 5.6 % Final   Hgb A1c MFr Bld  Date Value Ref Range Status  07/06/2015 7.0 (H) 4.8 - 5.6 % Final    Comment:             Pre-diabetes: 5.7 - 6.4          Diabetes: >6.4          Glycemic control for adults with diabetes: <7.0          Passed - eGFR in normal range and within 360 days    GFR calc Af Amer  Date Value Ref Range Status  06/10/2019 104 >59 mL/min/1.73 Final   GFR calc non Af Amer  Date Value Ref Range Status  06/10/2019 90 >59 mL/min/1.73 Final         Passed - Valid encounter within last 6 months    Recent Outpatient Visits          2 months ago Diabetes mellitus without complication [facility])   [place], Adriana M, PA-C   6 months ago Diabetes mellitus without complication [facility])   [place], [place], PA-C   10 months ago Diabetes mellitus without complication [facility])   [facility] [place], [place], PA-C   1 year ago Diabetes mellitus without complication [facility] [place])   [place], [place] [facility], PA-C   1 year ago Diabetes mellitus without complication [facility])   [facility] Trinna Post, [place]      Future Appointments            In 1 month Terrilee Croak, Wendee Beavers, PA-C [employer], [facility]

## 2020-01-06 ENCOUNTER — Other Ambulatory Visit: Payer: Self-pay | Admitting: Physician Assistant

## 2020-01-06 DIAGNOSIS — Z981 Arthrodesis status: Secondary | ICD-10-CM

## 2020-01-06 DIAGNOSIS — M542 Cervicalgia: Secondary | ICD-10-CM

## 2020-01-06 NOTE — Telephone Encounter (Signed)
Requested medication (s) are due for refill today - yes  Requested medication (s) are on the active medication list -yes  Future visit scheduled -yes  Last refill: 12/10/19  Notes to clinic: Request non delegated Rx  Requested Prescriptions  Pending Prescriptions Disp Refills   traMADol (ULTRAM) 50 MG tablet [Pharmacy Med Name: TRAMADOL HCL 50 MG TABLET] 60 tablet 0    Sig: TAKE 1 TABLET BY MOUTH TWICE A DAY AS NEEDED FOR MODERATE PAIN      Not Delegated - Analgesics:  Opioid Agonists Failed - 01/06/2020 11:22 AM      Failed - This refill cannot be delegated      Failed - Urine Drug Screen completed in last 360 days.      Passed - Valid encounter within last 6 months    Recent Outpatient Visits           3 months ago Diabetes mellitus without complication [facility])   [place], [place], PA-C   7 months ago Diabetes mellitus without complication [facility])   [place], [place], PA-C   11 months ago Diabetes mellitus without complication [facility])   [facility] [place], Fabio Bering M, PA-C   1 year ago Diabetes mellitus without complication [facility])   [facility] [place], Fabio Bering M, PA-C   1 year ago Diabetes mellitus without complication [facility])   [facility] Trinna Post, PA-C       Future Appointments             In 1 month Terrilee Croak, Wendee Beavers, PA-C [employer], [facility]                Requested Prescriptions  Pending Prescriptions Disp Refills   traMADol (ULTRAM) 50 MG tablet [Pharmacy Med Name: TRAMADOL HCL 50 MG TABLET] 60 tablet 0    Sig: TAKE 1 TABLET BY MOUTH TWICE A DAY AS NEEDED FOR MODERATE PAIN      Not Delegated - Analgesics:  Opioid Agonists Failed - 01/06/2020 11:22 AM      Failed - This refill cannot be delegated      Failed - Urine Drug Screen completed in last 360 days.      Passed - Valid encounter within last 6 months    Recent Outpatient Visits            3 months ago Diabetes mellitus without complication [facility])   [place], [place], PA-C   7 months ago Diabetes mellitus without complication [facility])   [place], [place], PA-C   11 months ago Diabetes mellitus without complication [facility])   [facility] [place], Fabio Bering M, PA-C   1 year ago Diabetes mellitus without complication [facility])   [facility] [place], Fabio Bering M, PA-C   1 year ago Diabetes mellitus without complication [facility])   [facility] Trinna Post, PA-C       Future Appointments             In 1 month Terrilee Croak, Wendee Beavers, PA-C [employer], [facility]

## 2020-02-03 NOTE — Progress Notes (Signed)
Complete physical exam   Patient: Nathan Hull   DOB: 1960/05/26   59 y.o. Male  MRN: [id number] Visit Date: 02/04/2020  Today's healthcare provider: Trinna Post, PA-C   Chief Complaint  Patient presents with  . Annual Exam  . Diabetes  . Hypertension   Subjective    Nathan Hull is a 59 y.o. male who presents today for a complete physical exam.  He reports consuming a general diet. The patient does not participate in regular exercise at present. He generally feels fairly well. He reports sleeping fairly well. He does not have additional problems to discuss today.    HPI  Diabetes Mellitus Type II, Follow-up  Lab Results  Component Value Date   HGBA1C 7.2 (A) 10/08/2019   HGBA1C 7.1 (A) 06/04/2019   HGBA1C 6.8 (A) 02/04/2019   Wt Readings from Last 3 Encounters:  02/04/20 288 lb (130.6 kg)  10/08/19 287 lb (130.2 kg)  06/04/19 291 lb 12.8 oz (132.4 kg)   Last seen for diabetes 9 months ago.  Management since then includes no changes. He reports good compliance with treatment. He is not having side effects.  Symptoms: No fatigue No foot ulcerations  No appetite changes No nausea  No paresthesia of the feet  No polydipsia  No polyuria No visual disturbances   No vomiting     Home blood sugar records: not being checked  Episodes of hypoglycemia? No    Current insulin regiment: none Most Recent Eye Exam: up to date 07/2019 with Dr. Ellin Mayhew Current exercise: no regular exercise Current diet habits: well balanced  Pertinent Labs: Lab Results  Component Value Date   CHOL 139 06/10/2019   HDL 29 (L) 06/10/2019   LDLCALC 79 06/10/2019   TRIG 178 (H) 06/10/2019   CHOLHDL 4.8 06/10/2019   Lab Results  Component Value Date   NA 138 06/10/2019   K 4.8 06/10/2019   CREATININE 0.94 06/10/2019   GFRNONAA 90 06/10/2019   GFRAA 104 06/10/2019   GLUCOSE 148 (H) 06/10/2019     Hypertension, follow-up  BP Readings from Last 3 Encounters:  02/04/20  (!) 156/81  10/08/19 134/76  06/04/19 138/88   Wt Readings from Last 3 Encounters:  02/04/20 288 lb (130.6 kg)  10/08/19 287 lb (130.2 kg)  06/04/19 291 lb 12.8 oz (132.4 kg)     He was last seen for hypertension 4 months ago.  BP at that visit was 134/76. Management since that visit includes no changes.  He reports good compliance with treatment. He is not having side effects.  He is following a Regular diet. He is not exercising. He does not smoke.  Use of agents associated with hypertension: none.   Outside blood pressures are not being checked.  Lipid/Cholesterol, Follow-up  Last lipid panel Other pertinent labs  Lab Results  Component Value Date   CHOL 139 06/10/2019   HDL 29 (L) 06/10/2019   LDLCALC 79 06/10/2019   TRIG 178 (H) 06/10/2019   CHOLHDL 4.8 06/10/2019   Lab Results  Component Value Date   ALT 17 06/10/2019   AST 15 06/10/2019   PLT 234 06/10/2019   TSH 1.230 06/10/2019     He was last seen for this 4 months ago.  Management since that visit includes changing lipitor 80 mg to crestor 20 mg QHS.  He reports excellent compliance with treatment. He is not having side effects.   Symptoms: No chest pain No chest pressure/discomfort  No dyspnea No lower extremity edema  No numbness or tingling of extremity No orthopnea  No palpitations No paroxysmal nocturnal dyspnea  No speech difficulty No syncope   Current diet: not asked Current exercise: none  The 10-year ASCVD risk score Mikey Bussing DC Jr., et al., 2013) is: 21.2%   Chronic Neck Pain: Using 50 mg tramadol BID PRN. He is using this for cervical degenerative disease. He gets good relief with this regimen. Prior drug screen acceptable.He is able to perform his work functions using this medication.  ---------------------------------------------------------------------------------------------------   Past Medical History:  Diagnosis Date  . Arthritis   . CAD (coronary artery disease)   .  Diabetes mellitus without complication ([place])   . Difficult intubation 09/24/2013   reports told difficult after CABG at [facility].  Glidescope used  . Dyspnea    walking a mile  . Family history of anesthesia complication    mother had N/V after valve replacement  . History of kidney stones   . Hyperlipidemia   . Hypertension   . Myocardial infarct ([place]) 2007  . Obesity   . Wears contact lenses    Past Surgical History:  Procedure Laterality Date  . APPLICATION OF WOUND VAC Right 10/22/2013   Procedure: APPLICATION OF WOUND VAC;  Surgeon: [name], MD;  Location: [place];  Service: Vascular;  Laterality: Right;  . C-Spine disc replacement x 2     06/16/2012  . CARDIAC CATHETERIZATION     last week [facility]  . cardiac stents     has been cathed x 4   Had  MI  in 2007  . CARPAL TUNNEL RELEASE     on right  . COLONOSCOPY WITH PROPOFOL N/A 04/26/2016   Procedure: COLONOSCOPY WITH PROPOFOL;  Surgeon: [name], MD;  Location: [place];  Service: Endoscopy;  Laterality: N/A;  . CORONARY ARTERY BYPASS GRAFT N/A 09/24/2013   Procedure: CORONARY ARTERY BYPASS GRAFTING (CABG) x three, using LIMA to LAD, and right leg greater saphenous vein harvested endoscopically - SVG to Ramus, SVG to PL;  Surgeon: [name], MD;  Location: [place];  Service: Open Heart Surgery;  Laterality: N/A;  . HEMORRHOID SURGERY N/A 05/14/2018   Procedure: HEMORRHOIDECTOMY;  Surgeon: Benjamine Sprague, DO;  Location: [facility];  Service: General;  Laterality: N/A;  . I & D EXTREMITY Right 10/22/2013   Procedure: IRRIGATION AND DEBRIDEMENT EXTREMITY-RIGHT LEG;  Surgeon: [name], MD;  Location: [place];  Service: Vascular;  Laterality: Right;  . INTRAOPERATIVE TRANSESOPHAGEAL ECHOCARDIOGRAM N/A 09/24/2013   Procedure: INTRAOPERATIVE TRANSESOPHAGEAL ECHOCARDIOGRAM;  Surgeon: [name], MD;  Location: [place];  Service: Open Heart Surgery;  Laterality: N/A;  . POLYPECTOMY  04/26/2016   Procedure:  POLYPECTOMY;  Surgeon: [name], MD;  Location: [place];  Service: Endoscopy;;  . PTCA at [facility]     04/28/2005  . SPINAL FUSION     vertebra, [facility]gentital : Dr. Annette Stable   . TONSILLECTOMY     Social History   Socioeconomic History  . Marital status: Divorced    Spouse name: Not on file  . Number of children: Not on file  . Years of education: Not on file  . Highest education level: Not on file  Occupational History  . Not on file  Tobacco Use  . Smoking status: Former Smoker    Packs/day: 2.00    Types: Cigarettes    Quit date: 04/23/2005    Years since quitting: 14.7  .  Smokeless tobacco: Never Used  Vaping Use  . Vaping Use: Never used  Substance and Sexual Activity  . Alcohol use: Yes    Alcohol/week: 6.0 standard drinks    Types: 6 Cans of beer per week    Comment: half a 6 pack a month  . Drug use: No  . Sexual activity: Not on file  Other Topics Concern  . Not on file  Social History Narrative  . Not on file   Social Determinants of Health   Financial Resource Strain:   . Difficulty of Paying Living Expenses: Not on file  Food Insecurity:   . Worried About Charity fundraiser in the Last Year: Not on file  . Ran Out of Food in the Last Year: Not on file  Transportation Needs:   . Lack of Transportation (Medical): Not on file  . Lack of Transportation (Non-Medical): Not on file  Physical Activity:   . Days of Exercise per Week: Not on file  . Minutes of Exercise per Session: Not on file  Stress:   . Feeling of Stress : Not on file  Social Connections:   . Frequency of Communication with Friends and Family: Not on file  . Frequency of Social Gatherings with Friends and Family: Not on file  . Attends Religious Services: Not on file  . Active Member of Clubs or Organizations: Not on file  . Attends Archivist Meetings: Not on file  . Marital Status: Not on file  Intimate Partner Violence:   . Fear of Current or Ex-Partner: Not on file    . Emotionally Abused: Not on file  . Physically Abused: Not on file  . Sexually Abused: Not on file   Family Status  Relation Name Status  . Mother  (Not Specified)  . Father  (Not Specified)   Family History  Problem Relation Age of Onset  . Hypertension Mother   . Rheumatic fever Mother        S/P MVR  . Hypertension Father    Allergies  Allergen Reactions  . Metformin And Related Other (See Comments)    Abdominal pain and diarrhea    Patient Care Team: Paulene Floor as PCP - General (Physician Assistant) Dionisio David, MD (Cardiology) Prescott Gum, Collier Salina, MD as Consulting Physician (Cardiothoracic Surgery)   Medications: Outpatient Medications Prior to Visit  Medication Sig  . traMADol (ULTRAM) 50 MG tablet TAKE 1 TABLET BY MOUTH TWICE A DAY AS NEEDED FOR MODERATE PAIN  . aspirin EC 325 MG EC tablet Take 1 tablet (325 mg total) by mouth daily.  . Blood Glucose Monitoring Suppl (ONE TOUCH ULTRA MINI) w/Device KIT USE AS DIRECTED  . clopidogrel (PLAVIX) 75 MG tablet TAKE 1 TABLET BY MOUTH EVERY DAY  . FARXIGA 5 MG TABS tablet TAKE 1 TABLET BY MOUTH EVERY DAY  . ibuprofen (ADVIL,MOTRIN) 800 MG tablet Take 1 tablet (800 mg total) by mouth every 8 (eight) hours as needed for mild pain or moderate pain.  Marland Kitchen lisinopril (ZESTRIL) 20 MG tablet Take 20 mg by mouth daily.  . metoprolol succinate (TOPROL-XL) 100 MG 24 hr tablet Take 100 mg by mouth daily.  . ONE TOUCH ULTRA TEST test strip USE AS DIRECTED  . ONETOUCH DELICA LANCETS FINE MISC See admin instructions.  . rosuvastatin (CRESTOR) 20 MG tablet Take 1 tablet (20 mg total) by mouth daily.   No facility-administered medications prior to visit.    Review of Systems  Constitutional:  Negative.   HENT: Negative.   Eyes: Negative.   Respiratory: Negative.   Cardiovascular: Negative.   Gastrointestinal: Negative.   Endocrine: Negative.   Genitourinary: Negative.   Musculoskeletal: Positive for arthralgias.   Skin: Negative.   Allergic/Immunologic: Negative.   Neurological: Negative.   Hematological: Negative.   Psychiatric/Behavioral: Negative.       Objective    BP (!) 156/81 (BP Location: Right Arm)   Pulse (!) 53   Temp 98.1 F (36.7 C)   Resp 16   Ht 6' (1.829 m)   Wt 288 lb (130.6 kg)   BMI 39.06 kg/m    Physical Exam Constitutional:      Appearance: Normal appearance.  Cardiovascular:     Rate and Rhythm: Normal rate and regular rhythm.     Heart sounds: Normal heart sounds.  Pulmonary:     Effort: Pulmonary effort is normal.     Breath sounds: Normal breath sounds.  Abdominal:     General: Bowel sounds are normal.     Palpations: Abdomen is soft.  Skin:    General: Skin is warm and dry.  Neurological:     Mental Status: He is alert and oriented to person, place, and time. Mental status is at baseline.  Psychiatric:        Mood and Affect: Mood normal.        Behavior: Behavior normal.       Last depression screening scores PHQ 2/9 Scores 04/28/2018 01/26/2018  PHQ - 2 Score 6 0   Last fall risk screening Fall Risk  04/28/2018  Falls in the past year? 0   Last Audit-C alcohol use screening No flowsheet data found. A score of 3 or more in women, and 4 or more in men indicates increased risk for alcohol abuse, EXCEPT if all of the points are from question 1   No results found for any visits on 02/04/20.  Assessment & Plan    Routine Health Maintenance and Physical Exam  Exercise Activities and Dietary recommendations Goals   None     Immunization History  Administered Date(s) Administered  . Pneumococcal Polysaccharide-23 09/18/2018    Health Maintenance  Topic Date Due  . COVID-19 Vaccine (1) Never done  . OPHTHALMOLOGY EXAM  07/02/2018  . TETANUS/TDAP  10/07/2020 (Originally 07/01/1981)  . INFLUENZA VACCINE  01/26/2021 (Originally 10/31/2019)  . HEMOGLOBIN A1C  04/09/2020  . FOOT EXAM  10/07/2020  . COLONOSCOPY  04/26/2026  . PNEUMOCOCCAL  POLYSACCHARIDE VACCINE AGE 4-64 HIGH RISK  Completed  . Hepatitis C Screening  Completed  . HIV Screening  Completed    Discussed health benefits of physical activity, and encouraged him to engage in regular exercise appropriate for his age and condition.   1. Annual physical exam  - TSH - Lipid panel - Comprehensive metabolic panel - CBC with Differential/Platelet - PSA  2. Benign essential HTN  Continue current medications, he forgot to take his medications today.   - TSH - Lipid panel - Comprehensive metabolic panel - CBC with Differential/Platelet - PSA  3. Mixed hyperlipidemia  Continue crestor.  - TSH - Lipid panel - Comprehensive metabolic panel - CBC with Differential/Platelet - PSA  4. Diabetes mellitus without complication ([place])  Increase farxiga for better glycemic control.   - dapagliflozin propanediol (FARXIGA) 10 MG TABS tablet; Take 1 tablet (10 mg total) by mouth daily before breakfast.  Dispense: 90 tablet; Refill: 0 - POCT glycosylated hemoglobin (Hb A1C)  5. S/P cervical spinal  fusion  Continue tramado.  6. Chronic, continuous use of opioids  - Pain Mgt Scrn (14 Drugs), Ur  7. Need for tetanus booster  - Tdap vaccine greater than or equal to 7yo IM  No follow-ups on file.     ITrinna Post, PA-C, have reviewed all documentation for this visit. The documentation on 02/04/20 for the exam, diagnosis, procedures, and orders are all accurate and complete.  The entirety of the information documented in the History of Present Illness, Review of Systems and Physical Exam were personally obtained by me. Portions of this information were initially documented by [facility] and reviewed by me for thoroughness and accuracy.     Paulene Floor  [facility] [phone] (phone) [phone] (fax)  [place]

## 2020-02-04 ENCOUNTER — Ambulatory Visit (INDEPENDENT_AMBULATORY_CARE_PROVIDER_SITE_OTHER): Payer: 59 | Admitting: Physician Assistant

## 2020-02-04 ENCOUNTER — Other Ambulatory Visit: Payer: Self-pay

## 2020-02-04 ENCOUNTER — Encounter: Payer: Self-pay | Admitting: Physician Assistant

## 2020-02-04 VITALS — BP 156/81 | HR 53 | Temp 98.1°F | Resp 16 | Ht 72.0 in | Wt 288.0 lb

## 2020-02-04 DIAGNOSIS — I10 Essential (primary) hypertension: Secondary | ICD-10-CM

## 2020-02-04 DIAGNOSIS — Z0000 Encounter for general adult medical examination without abnormal findings: Secondary | ICD-10-CM | POA: Diagnosis not present

## 2020-02-04 DIAGNOSIS — E782 Mixed hyperlipidemia: Secondary | ICD-10-CM

## 2020-02-04 DIAGNOSIS — Z981 Arthrodesis status: Secondary | ICD-10-CM | POA: Diagnosis not present

## 2020-02-04 DIAGNOSIS — Z23 Encounter for immunization: Secondary | ICD-10-CM

## 2020-02-04 DIAGNOSIS — E119 Type 2 diabetes mellitus without complications: Secondary | ICD-10-CM

## 2020-02-04 DIAGNOSIS — F1190 Opioid use, unspecified, uncomplicated: Secondary | ICD-10-CM

## 2020-02-04 LAB — POCT GLYCOSYLATED HEMOGLOBIN (HGB A1C)
Est. average glucose Bld gHb Est-mCnc: 171
Hemoglobin A1C: 7.6 % — AB (ref 4.0–5.6)

## 2020-02-04 MED ORDER — DAPAGLIFLOZIN PROPANEDIOL 10 MG PO TABS: 10.0000 mg | ORAL_TABLET | Freq: Every day | ORAL | 0 refills | Status: DC

## 2020-02-04 NOTE — Patient Instructions (Signed)
Ask insurance company about Shingrix for shingles     Health Maintenance, Male Adopting a healthy lifestyle and getting preventive care are important in promoting health and wellness. Ask your health care provider about:  The right schedule for you to have regular tests and exams.  Things you can do on your own to prevent diseases and keep yourself healthy. What should I know about diet, weight, and exercise? Eat a healthy diet   Eat a diet that includes plenty of vegetables, fruits, low-fat dairy products, and lean protein.  Do not eat a lot of foods that are high in solid fats, added sugars, or sodium. Maintain a healthy weight Body mass index (BMI) is a measurement that can be used to identify possible weight problems. It estimates body fat based on height and weight. Your health care provider can help determine your BMI and help you achieve or maintain a healthy weight. Get regular exercise Get regular exercise. This is one of the most important things you can do for your health. Most adults should:  Exercise for at least 150 minutes each week. The exercise should increase your heart rate and make you sweat (moderate-intensity exercise).  Do strengthening exercises at least twice a week. This is in addition to the moderate-intensity exercise.  Spend less time sitting. Even light physical activity can be beneficial. Watch cholesterol and blood lipids Have your blood tested for lipids and cholesterol at 57 years of age, then have this test every 5 years. You may need to have your cholesterol levels checked more often if:  Your lipid or cholesterol levels are high.  You are older than 57 years of age.  You are at high risk for heart disease. What should I know about cancer screening? Many types of cancers can be detected early and may often be prevented. Depending on your health history and family history, you may need to have cancer screening at various ages. This may include  screening for:  Colorectal cancer.  Prostate cancer.  Skin cancer.  Lung cancer. What should I know about heart disease, diabetes, and high blood pressure? Blood pressure and heart disease  High blood pressure causes heart disease and increases the risk of stroke. This is more likely to develop in people who have high blood pressure readings, are of African descent, or are overweight.  Talk with your health care provider about your target blood pressure readings.  Have your blood pressure checked: ? Every 3-5 years if you are 47-22 years of age. ? Every year if you are 67 years old or older.  If you are between the ages of 36 and 75 and are a current or former smoker, ask your health care provider if you should have a one-time screening for abdominal aortic aneurysm (AAA). Diabetes Have regular diabetes screenings. This checks your fasting blood sugar level. Have the screening done:  Once every three years after age 34 if you are at a normal weight and have a low risk for diabetes.  More often and at a younger age if you are overweight or have a high risk for diabetes. What should I know about preventing infection? Hepatitis B If you have a higher risk for hepatitis B, you should be screened for this virus. Talk with your health care provider to find out if you are at risk for hepatitis B infection. Hepatitis C Blood testing is recommended for:  Everyone born from [birth year] through 1963.  Anyone with known risk factors for hepatitis C.  Sexually transmitted infections (STIs)  You should be screened each year for STIs, including gonorrhea and chlamydia, if: ? You are sexually active and are younger than 57 years of age. ? You are older than 57 years of age and your health care provider tells you that you are at risk for this type of infection. ? Your sexual activity has changed since you were last screened, and you are at increased risk for chlamydia or gonorrhea. Ask your health  care provider if you are at risk.  Ask your health care provider about whether you are at high risk for HIV. Your health care provider may recommend a prescription medicine to help prevent HIV infection. If you choose to take medicine to prevent HIV, you should first get tested for HIV. You should then be tested every 3 months for as long as you are taking the medicine. Follow these instructions at home: Lifestyle  Do not use any products that contain nicotine or tobacco, such as cigarettes, e-cigarettes, and chewing tobacco. If you need help quitting, ask your health care provider.  Do not use street drugs.  Do not share needles.  Ask your health care provider for help if you need support or information about quitting drugs. Alcohol use  Do not drink alcohol if your health care provider tells you not to drink.  If you drink alcohol: ? Limit how much you have to 0-2 drinks a day. ? Be aware of how much alcohol is in your drink. In the [place] one drink equals one 12 oz bottle of beer (355 mL), one 5 oz glass of wine (148 mL), or one 1 oz glass of hard liquor (44 mL). General instructions  Schedule regular health, dental, and eye exams.  Stay current with your vaccines.  Tell your health care provider if: ? You often feel depressed. ? You have ever been abused or do not feel safe at home. Summary  Adopting a healthy lifestyle and getting preventive care are important in promoting health and wellness.  Follow your health care provider's instructions about healthy diet, exercising, and getting tested or screened for diseases.  Follow your health care provider's instructions on monitoring your cholesterol and blood pressure. This information is not intended to replace advice given to you by your health care provider. Make sure you discuss any questions you have with your health care provider. Document Revised: 03/11/2018 Document Reviewed: 03/11/2018 Elsevier Patient Education  2020  [employer].

## 2020-02-05 LAB — CBC WITH DIFFERENTIAL/PLATELET
Basophils Absolute: 0 10*3/uL (ref 0.0–0.2)
Basos: 1 %
EOS (ABSOLUTE): 0.1 10*3/uL (ref 0.0–0.4)
Eos: 2 %
Hematocrit: 43.5 % (ref 37.5–51.0)
Hemoglobin: 14.6 g/dL (ref 13.0–17.7)
Immature Grans (Abs): 0 10*3/uL (ref 0.0–0.1)
Immature Granulocytes: 0 %
Lymphocytes Absolute: 2.1 10*3/uL (ref 0.7–3.1)
Lymphs: 31 %
MCH: 29.4 pg (ref 26.6–33.0)
MCHC: 33.6 g/dL (ref 31.5–35.7)
MCV: 88 fL (ref 79–97)
Monocytes Absolute: 0.5 10*3/uL (ref 0.1–0.9)
Monocytes: 7 %
Neutrophils Absolute: 4.1 10*3/uL (ref 1.4–7.0)
Neutrophils: 59 %
Platelets: 234 10*3/uL (ref 150–450)
RBC: 4.96 x10E6/uL (ref 4.14–5.80)
RDW: 12.3 % (ref 11.6–15.4)
WBC: 6.9 10*3/uL (ref 3.4–10.8)

## 2020-02-05 LAB — COMPREHENSIVE METABOLIC PANEL
ALT: 20 IU/L (ref 0–44)
AST: 14 IU/L (ref 0–40)
Albumin/Globulin Ratio: 1.7 (ref 1.2–2.2)
Albumin: 4.4 g/dL (ref 3.8–4.9)
Alkaline Phosphatase: 88 IU/L (ref 44–121)
BUN/Creatinine Ratio: 12 (ref 9–20)
BUN: 11 mg/dL (ref 6–24)
Bilirubin Total: 0.3 mg/dL (ref 0.0–1.2)
CO2: 23 mmol/L (ref 20–29)
Calcium: 9.6 mg/dL (ref 8.7–10.2)
Chloride: 100 mmol/L (ref 96–106)
Creatinine, Ser: 0.89 mg/dL (ref 0.76–1.27)
GFR calc Af Amer: 110 mL/min/{1.73_m2} (ref >59)
GFR calc non Af Amer: 95 mL/min/{1.73_m2} (ref >59)
Globulin, Total: 2.6 g/dL (ref 1.5–4.5)
Glucose: 150 mg/dL — ABNORMAL HIGH (ref 65–99)
Potassium: 4.8 mmol/L (ref 3.5–5.2)
Sodium: 138 mmol/L (ref 134–144)
Total Protein: 7 g/dL (ref 6.0–8.5)

## 2020-02-05 LAB — PSA: Prostate Specific Ag, Serum: 0.7 ng/mL (ref 0.0–4.0)

## 2020-02-05 LAB — LIPID PANEL
Chol/HDL Ratio: 4.3 ratio (ref 0.0–5.0)
Cholesterol, Total: 130 mg/dL (ref 100–199)
HDL: 30 mg/dL — ABNORMAL LOW (ref >39)
LDL Chol Calc (NIH): 65 mg/dL (ref 0–99)
Triglycerides: 210 mg/dL — ABNORMAL HIGH (ref 0–149)
VLDL Cholesterol Cal: 35 mg/dL (ref 5–40)

## 2020-02-05 LAB — TSH: TSH: 1.19 u[IU]/mL (ref 0.450–4.500)

## 2020-02-07 ENCOUNTER — Other Ambulatory Visit: Payer: Self-pay | Admitting: Physician Assistant

## 2020-02-07 DIAGNOSIS — Z981 Arthrodesis status: Secondary | ICD-10-CM

## 2020-02-07 DIAGNOSIS — M542 Cervicalgia: Secondary | ICD-10-CM

## 2020-02-07 NOTE — Telephone Encounter (Signed)
Requested medication (s) are due for refill today - yes  Requested medication (s) are on the active medication list -yes  Future visit scheduled -yes  Last refill: 01/07/20  Notes to clinic: Request non delegated Rx  Requested Prescriptions  Pending Prescriptions Disp Refills   traMADol (ULTRAM) 50 MG tablet [Pharmacy Med Name: TRAMADOL HCL 50 MG TABLET] 60 tablet 0    Sig: TAKE 1 TABLET BY MOUTH TWICE A DAY AS NEEDED FOR MODERATE PAIN      Not Delegated - Analgesics:  Opioid Agonists Failed - 02/07/2020 12:20 PM      Failed - This refill cannot be delegated      Failed - Urine Drug Screen completed in last 360 days      Passed - Valid encounter within last 6 months    Recent Outpatient Visits           3 days ago Annual physical exam   [employer], Adriana M, PA-C   4 months ago Diabetes mellitus without complication [facility])   [place], [place], PA-C   8 months ago Diabetes mellitus without complication [facility])   [facility] [place], Fabio Bering M, PA-C   1 year ago Diabetes mellitus without complication [facility])   [facility] [place], Fabio Bering M, PA-C   1 year ago Diabetes mellitus without complication [facility])   [facility] Trinna Post, [place]       Future Appointments             In 4 months Terrilee Croak, Wendee Beavers, PA-C [employer], [place]                Requested Prescriptions  Pending Prescriptions Disp Refills   traMADol (ULTRAM) 50 MG tablet [Pharmacy Med Name: TRAMADOL HCL 50 MG TABLET] 60 tablet 0    Sig: TAKE 1 TABLET BY MOUTH TWICE A DAY AS NEEDED FOR MODERATE PAIN      Not Delegated - Analgesics:  Opioid Agonists Failed - 02/07/2020 12:20 PM      Failed - This refill cannot be delegated      Failed - Urine Drug Screen completed in last 360 days      Passed - Valid encounter within last 6 months    Recent Outpatient Visits           3 days ago Annual  physical exam   [facility] Carles Collet M, PA-C   4 months ago Diabetes mellitus without complication [facility])   [place], [place], PA-C   8 months ago Diabetes mellitus without complication [facility])   [facility] [place], [place], PA-C   1 year ago Diabetes mellitus without complication [facility])   [facility] [place], [place], PA-C   1 year ago Diabetes mellitus without complication [facility])   [facility] Trinna Post, [place]       Future Appointments             In 4 months Terrilee Croak, Wendee Beavers, PA-C [employer], [facility]

## 2020-02-08 LAB — PAIN MGT SCRN (14 DRUGS), UR
Amphetamine Scrn, Ur: NEGATIVE ng/mL
BARBITURATE SCREEN URINE: NEGATIVE ng/mL
BENZODIAZEPINE SCREEN, URINE: NEGATIVE ng/mL
Buprenorphine, Urine: NEGATIVE ng/mL
CANNABINOIDS UR QL SCN: NEGATIVE ng/mL
Cocaine (Metab) Scrn, Ur: NEGATIVE ng/mL
Creatinine(Crt), U: 177.9 mg/dL (ref 20.0–300.0)
Fentanyl, Urine: NEGATIVE pg/mL
Meperidine Screen, Urine: NEGATIVE ng/mL
Methadone Screen, Urine: NEGATIVE ng/mL
OXYCODONE+OXYMORPHONE UR QL SCN: NEGATIVE ng/mL
Opiate Scrn, Ur: NEGATIVE ng/mL
Ph of Urine: 7.8 (ref 4.5–8.9)
Phencyclidine Qn, Ur: NEGATIVE ng/mL
Propoxyphene Scrn, Ur: NEGATIVE ng/mL
Tramadol Screen, Urine: POSITIVE ng/mL — AB

## 2020-02-11 ENCOUNTER — Encounter: Payer: Self-pay | Admitting: Physician Assistant

## 2020-03-03 ENCOUNTER — Other Ambulatory Visit: Payer: Self-pay | Admitting: Physician Assistant

## 2020-03-03 DIAGNOSIS — I25810 Atherosclerosis of coronary artery bypass graft(s) without angina pectoris: Secondary | ICD-10-CM

## 2020-03-06 ENCOUNTER — Other Ambulatory Visit: Payer: Self-pay | Admitting: Physician Assistant

## 2020-03-06 DIAGNOSIS — M542 Cervicalgia: Secondary | ICD-10-CM

## 2020-03-06 DIAGNOSIS — Z981 Arthrodesis status: Secondary | ICD-10-CM

## 2020-03-06 NOTE — Telephone Encounter (Signed)
Requested medication (s) are due for refill today: yes  Requested medication (s) are on the active medication list: yes  Last refill: 02/07/2020  Future visit scheduled: yes  Notes to clinic:  this refill cannot be delegated    Requested Prescriptions  Pending Prescriptions Disp Refills   traMADol (ULTRAM) 50 MG tablet [Pharmacy Med Name: TRAMADOL HCL 50 MG TABLET] 60 tablet 0    Sig: TAKE 1 TABLET BY MOUTH TWICE A DAY AS NEEDED FOR MODERATE PAIN      Not Delegated - Analgesics:  Opioid Agonists Failed - 03/06/2020  9:58 AM      Failed - This refill cannot be delegated      Failed - Urine Drug Screen completed in last 360 days      Passed - Valid encounter within last 6 months    Recent Outpatient Visits           1 month ago Annual physical exam   [place], [place], PA-C   5 months ago Diabetes mellitus without complication [facility])   [place], [place], PA-C   9 months ago Diabetes mellitus without complication [facility])   [facility] Rives, [place], PA-C   1 year ago Diabetes mellitus without complication [facility])   [facility] [place], [place], PA-C   1 year ago Diabetes mellitus without complication [facility])   [facility] Trinna Post, [place]       Future Appointments             In 3 months Terrilee Croak, Wendee Beavers, PA-C [employer], [facility]

## 2020-03-07 NOTE — Telephone Encounter (Signed)
L.O.V. was on 02/04/2020 and upcoming visit is on 06/06/2020.

## 2020-03-08 ENCOUNTER — Other Ambulatory Visit: Payer: Self-pay | Admitting: Physician Assistant

## 2020-03-08 DIAGNOSIS — E782 Mixed hyperlipidemia: Secondary | ICD-10-CM

## 2020-03-09 NOTE — Telephone Encounter (Signed)
Pt calling regarding this medication request. Please advise.

## 2020-03-09 NOTE — Telephone Encounter (Signed)
Patient was advised medication was at pharmacy and ready to be picked up.

## 2020-04-06 ENCOUNTER — Other Ambulatory Visit: Payer: Self-pay | Admitting: Physician Assistant

## 2020-04-06 DIAGNOSIS — M542 Cervicalgia: Secondary | ICD-10-CM

## 2020-04-06 DIAGNOSIS — Z981 Arthrodesis status: Secondary | ICD-10-CM

## 2020-04-06 NOTE — Telephone Encounter (Signed)
Requested medication (s) are due for refill today:   Provider to determine  Requested medication (s) are on the active medication list:   Yes  Future visit scheduled:   Yes   Last ordered: 03/09/2020 #60, 0 refills  Non delegated refill   Requested Prescriptions  Pending Prescriptions Disp Refills   traMADol (ULTRAM) 50 MG tablet [Pharmacy Med Name: TRAMADOL HCL 50 MG TABLET] 60 tablet 0    Sig: TAKE 1 TABLET BY MOUTH TWICE A DAY AS NEEDED FOR MODERATE PAIN      Not Delegated - Analgesics:  Opioid Agonists Failed - 04/06/2020 12:13 PM      Failed - This refill cannot be delegated      Failed - Urine Drug Screen completed in last 360 days      Passed - Valid encounter within last 6 months    Recent Outpatient Visits           2 months ago Annual physical exam   [facility] Osvaldo Angst M, PA-C   6 months ago Diabetes mellitus without complication [facility])   [facility] Clark, Ricki Rodriguez M, PA-C   10 months ago Diabetes mellitus without complication [facility])   [facility] Brimley, [place], PA-C   1 year ago Diabetes mellitus without complication [facility])   [facility] [place], [place], PA-C   1 year ago Diabetes mellitus without complication [facility])   [facility] Trey Sailors, [place]       Future Appointments             In 2 months Trey Sailors, PA-C [facility] [employer], [facility]

## 2020-04-09 ENCOUNTER — Other Ambulatory Visit: Payer: Self-pay | Admitting: Physician Assistant

## 2020-04-09 DIAGNOSIS — E119 Type 2 diabetes mellitus without complications: Secondary | ICD-10-CM

## 2020-04-11 NOTE — Telephone Encounter (Signed)
Patient checking on the status of traMADol (ULTRAM) 50 MG tablet request sent on 04/06/2020, please advise the patient directly

## 2020-04-12 ENCOUNTER — Telehealth: Payer: Self-pay

## 2020-04-12 NOTE — Telephone Encounter (Signed)
Called patient to advised medication was send into pharmacy and no answer or VM set up to leave a message.

## 2020-04-12 NOTE — Telephone Encounter (Signed)
Copied from [place] [phone]. Topic: General - Other >> Apr 12, 2020  1:59 PM Yvette Rack wrote: Reason for CRM: Pt called for an update on the Rx refill request for Tramadol that was originally requested on 04/06/20. Pt requests call back. Cb# [phone]

## 2020-04-21 NOTE — Telephone Encounter (Signed)
Patient has picked up tramadol on 04/13/20.

## 2020-04-25 ENCOUNTER — Telehealth: Payer: Self-pay

## 2020-04-25 DIAGNOSIS — I10 Essential (primary) hypertension: Secondary | ICD-10-CM

## 2020-04-25 MED ORDER — LISINOPRIL 20 MG PO TABS: 20.0000 mg | ORAL_TABLET | Freq: Every day | ORAL | 1 refills | Status: DC

## 2020-04-25 MED ORDER — METOPROLOL SUCCINATE ER 100 MG PO TB24: 100.0000 mg | ORAL_TABLET | Freq: Every day | ORAL | 1 refills | Status: DC

## 2020-04-25 NOTE — Telephone Encounter (Signed)
L.O.V. was on 02/04/2020 and next visit is on 06/06/2020. Medication send into pharmacy.

## 2020-04-25 NOTE — Telephone Encounter (Signed)
[facility] faxed refill request for the following medications:  lisinopril (ZESTRIL) 20 MG tablet metoprolol succinate (TOPROL-XL) 100 MG 24 hr tablet  Please advise. Thanks, [employer]

## 2020-05-11 ENCOUNTER — Telehealth: Payer: Self-pay

## 2020-05-11 DIAGNOSIS — Z981 Arthrodesis status: Secondary | ICD-10-CM

## 2020-05-11 DIAGNOSIS — M542 Cervicalgia: Secondary | ICD-10-CM

## 2020-05-11 MED ORDER — TRAMADOL HCL 50 MG PO TABS: ORAL_TABLET | ORAL | 0 refills | Status: DC

## 2020-05-11 NOTE — Telephone Encounter (Signed)
L.O.V. was on 02/04/2020 and next appointment is 06/06/2020.

## 2020-05-11 NOTE — Telephone Encounter (Signed)
[facility] faxed refill request for the following medications:  traMADol (ULTRAM) 50 MG tablet   Please advise. Thanks, [employer]

## 2020-05-11 NOTE — Telephone Encounter (Signed)
Refill sent.

## 2020-06-05 NOTE — Progress Notes (Signed)
Established patient visit   Patient: Nathan Hull   DOB: 01-Dec-1960   60 y.o. Male  MRN: [id number] Visit Date: 06/06/2020  Today's healthcare provider: Trinna Post, PA-C   Chief Complaint  Patient presents with  . Diabetes  I,Nathan Hull M Braulio Kiedrowski,acting as a scribe for [employer], PA-C.,have documented all relevant documentation on the behalf of Trinna Post, PA-C,as directed by  Trinna Post, PA-C while in the presence of Trinna Post, PA-C.  Subjective    HPI  Diabetes Mellitus Type II, Follow-up  Lab Results  Component Value Date   HGBA1C 7.6 (A) 06/06/2020   HGBA1C 7.6 (A) 02/04/2020   HGBA1C 7.2 (A) 10/08/2019   Wt Readings from Last 3 Encounters:  06/06/20 287 lb (130.2 kg)  02/04/20 288 lb (130.6 kg)  10/08/19 287 lb (130.2 kg)   Last seen for diabetes 4 months ago.  Management since then includes increased farxiga 10 mg for better glycemic control. Marland Kitchen He reports good compliance with treatment. He is not having side effects.  Symptoms: No fatigue No foot ulcerations  No appetite changes No nausea  No paresthesia of the feet  No polydipsia  No polyuria No visual disturbances   No vomiting     Home blood sugar records: not being checked  Episodes of hypoglycemia? No    Current insulin regiment: none Most Recent Eye Exam: Dr Ellin Mayhew, within the past year.  Current exercise: no regular exercise and walking Current diet habits: well balanced  Pertinent Labs: Lab Results  Component Value Date   CHOL 130 02/04/2020   HDL 30 (L) 02/04/2020   LDLCALC 65 02/04/2020   TRIG 210 (H) 02/04/2020   CHOLHDL 4.3 02/04/2020   Lab Results  Component Value Date   NA 138 02/04/2020   K 4.8 02/04/2020   CREATININE 0.89 02/04/2020   GFRNONAA 95 02/04/2020   GFRAA 110 02/04/2020   GLUCOSE 150 (H) 02/04/2020     ---------------------------------------------------------------------------------------------------  Lipid/Cholesterol,  Follow-up  Last lipid panel Other pertinent labs  Lab Results  Component Value Date   CHOL 130 02/04/2020   HDL 30 (L) 02/04/2020   LDLCALC 65 02/04/2020   TRIG 210 (H) 02/04/2020   CHOLHDL 4.3 02/04/2020   Lab Results  Component Value Date   ALT 20 02/04/2020   AST 14 02/04/2020   PLT 234 02/04/2020   TSH 1.190 02/04/2020     He was last seen for this 4 months ago.  Management since that visit includes continue statin.  He reports excellent compliance with treatment. He is not having side effects.   Symptoms: No chest pain No chest pressure/discomfort  No dyspnea No lower extremity edema  No numbness or tingling of extremity No orthopnea  No palpitations No paroxysmal nocturnal dyspnea  No speech difficulty No syncope   Current diet: well balanced Current exercise: aerobics  The 10-year ASCVD risk score Nathan Bussing DC Jr., et al., 2013) is: 18%  --------------------------------------------------------------------------------------------------- Hypertension, follow-up  BP Readings from Last 3 Encounters:  06/06/20 (!) 149/92  02/04/20 (!) 156/81  10/08/19 134/76   Wt Readings from Last 3 Encounters:  06/06/20 287 lb (130.2 kg)  02/04/20 288 lb (130.6 kg)  10/08/19 287 lb (130.2 kg)     He was last seen for hypertension 4 months ago.  BP at that visit was elevated. Management since that visit includes continue Lisinopril 20 mg daily  He reports excellent compliance with treatment. He is not having  side effects.  He is following a Regular diet. He is not exercising. He does not smoke.  Use of agents associated with hypertension: none.   Outside blood pressures are not checked. Symptoms: No chest pain No chest pressure  No palpitations No syncope  No dyspnea No orthopnea  No paroxysmal nocturnal dyspnea No lower extremity edema   Pertinent labs: Lab Results  Component Value Date   CHOL 130 02/04/2020   HDL 30 (L) 02/04/2020   LDLCALC 65 02/04/2020    TRIG 210 (H) 02/04/2020   CHOLHDL 4.3 02/04/2020   Lab Results  Component Value Date   NA 138 02/04/2020   K 4.8 02/04/2020   CREATININE 0.89 02/04/2020   GFRNONAA 95 02/04/2020   GFRAA 110 02/04/2020   GLUCOSE 150 (H) 02/04/2020     The 10-year ASCVD risk score Nathan Bussing DC Jr., et al., 2013) is: 18%   --------------------------------------------------------------------------------------------------- Chronic Neck Pain: Patient continues to use tramadol for chronic neck pain s/p ACDF.     Medications: Outpatient Medications Prior to Visit  Medication Sig  . aspirin EC 325 MG EC tablet Take 1 tablet (325 mg total) by mouth daily.  . Blood Glucose Monitoring Suppl (ONE TOUCH ULTRA MINI) w/Device KIT USE AS DIRECTED  . clopidogrel (PLAVIX) 75 MG tablet TAKE 1 TABLET BY MOUTH EVERY DAY  . FARXIGA 10 MG TABS tablet TAKE 1 TABLET BY MOUTH DAILY BEFORE BREAKFAST.  Marland Kitchen ibuprofen (ADVIL,MOTRIN) 800 MG tablet Take 1 tablet (800 mg total) by mouth every 8 (eight) hours as needed for mild pain or moderate pain.  . metoprolol succinate (TOPROL-XL) 100 MG 24 hr tablet Take 1 tablet (100 mg total) by mouth daily.  . ONE TOUCH ULTRA TEST test strip USE AS DIRECTED  . ONETOUCH DELICA LANCETS FINE MISC See admin instructions.  . rosuvastatin (CRESTOR) 20 MG tablet TAKE 1 TABLET BY MOUTH EVERY DAY  . [DISCONTINUED] lisinopril (ZESTRIL) 20 MG tablet Take 1 tablet (20 mg total) by mouth daily.  . [DISCONTINUED] traMADol (ULTRAM) 50 MG tablet Take one tablet twice daily as needed.   No facility-administered medications prior to visit.    Review of Systems     Objective    BP (!) 149/92 (BP Location: Left Arm, Patient Position: Sitting, Cuff Size: Normal)   Pulse (!) 55   Temp 98.1 F (36.7 C) (Oral)   Wt 287 lb (130.2 kg)   SpO2 99%   BMI 38.92 kg/m     Physical Exam Constitutional:      Appearance: Normal appearance.  Cardiovascular:     Rate and Rhythm: Normal rate and regular rhythm.      Heart sounds: Normal heart sounds.  Pulmonary:     Effort: Pulmonary effort is normal.     Breath sounds: Normal breath sounds.  Skin:    General: Skin is warm and dry.  Neurological:     Mental Status: He is alert and oriented to person, place, and time. Mental status is at baseline.  Psychiatric:        Mood and Affect: Mood normal.        Behavior: Behavior normal.       Results for orders placed or performed in visit on 06/06/20  POCT glycosylated hemoglobin (Hb A1C)  Result Value Ref Range   Hemoglobin A1C 7.6 (A) 4.0 - 5.6 %   HbA1c POC (<> result, manual entry)     HbA1c, POC (prediabetic range)     HbA1c, POC (controlled diabetic range)  Est. average glucose Bld gHb Est-mCnc 171     Assessment & Plan    1. Diabetes mellitus without complication (HCC)  Increased farxiga to 10 mg daily, however A1c the same. Patient has increased sugary intake over the holidays. Will work on lifestyle changes.  - POCT glycosylated hemoglobin (Hb A1C)  2. S/P cervical spinal fusion   - traMADol (ULTRAM) 50 MG tablet; Take one tablet twice daily as needed.  Dispense: 60 tablet; Refill: 0  3. Neck pain  - traMADol (ULTRAM) 50 MG tablet; Take one tablet twice daily as needed.  Dispense: 60 tablet; Refill: 0  4. Mixed hyperlipidemia  Continue statin.   5. Benign essential HTN  Add HCTZ to lisinopril for uncontrolled BP, follow up in three months.   - lisinopril-hydrochlorothiazide (ZESTORETIC) 20-25 MG tablet; Take 1 tablet by mouth daily.  Dispense: 90 tablet; Refill: 1   No follow-ups on file.      ITrinna Post, PA-C, have reviewed all documentation for this visit. The documentation on 06/07/20 for the exam, diagnosis, procedures, and orders are all accurate and complete.  The entirety of the information documented in the History of Present Illness, Review of Systems and Physical Exam were personally obtained by me. Portions of this information were initially  documented by [facility] and reviewed by me for thoroughness and accuracy.     Paulene Floor  [facility] [phone] (phone) [phone] (fax)  [place]

## 2020-06-06 ENCOUNTER — Encounter: Payer: Self-pay | Admitting: Physician Assistant

## 2020-06-06 ENCOUNTER — Ambulatory Visit: Payer: 59 | Admitting: Physician Assistant

## 2020-06-06 ENCOUNTER — Other Ambulatory Visit: Payer: Self-pay

## 2020-06-06 VITALS — BP 149/92 | HR 55 | Temp 98.1°F | Wt 287.0 lb

## 2020-06-06 DIAGNOSIS — E782 Mixed hyperlipidemia: Secondary | ICD-10-CM | POA: Diagnosis not present

## 2020-06-06 DIAGNOSIS — Z981 Arthrodesis status: Secondary | ICD-10-CM | POA: Diagnosis not present

## 2020-06-06 DIAGNOSIS — E119 Type 2 diabetes mellitus without complications: Secondary | ICD-10-CM | POA: Diagnosis not present

## 2020-06-06 DIAGNOSIS — M542 Cervicalgia: Secondary | ICD-10-CM

## 2020-06-06 DIAGNOSIS — I10 Essential (primary) hypertension: Secondary | ICD-10-CM

## 2020-06-06 LAB — POCT GLYCOSYLATED HEMOGLOBIN (HGB A1C)
Est. average glucose Bld gHb Est-mCnc: 171
Hemoglobin A1C: 7.6 % — AB (ref 4.0–5.6)

## 2020-06-06 MED ORDER — TRAMADOL HCL 50 MG PO TABS: ORAL_TABLET | ORAL | 0 refills | Status: DC

## 2020-06-06 MED ORDER — LISINOPRIL-HYDROCHLOROTHIAZIDE 20-25 MG PO TABS: 1.0000 | ORAL_TABLET | Freq: Every day | ORAL | 1 refills | Status: DC

## 2020-06-06 NOTE — Patient Instructions (Signed)
Diabetes Mellitus and Exercise Exercising regularly is important for overall health, especially for people who have diabetes mellitus. Exercising is not only about losing weight. It has many other health benefits, such as increasing muscle strength and bone density and reducing body fat and stress. This leads to improved fitness, flexibility, and endurance, all of which result in better overall health. What are the benefits of exercise if I have diabetes? Exercise has many benefits for people with diabetes. They include:  Helping to lower and control blood sugar (glucose).  Helping the body to respond better to the hormone insulin by improving insulin sensitivity.  Reducing how much insulin the body needs.  Lowering the risk for heart disease by: ? Lowering "bad" cholesterol and triglyceride levels. ? Increasing "good" cholesterol levels. ? Lowering blood pressure. ? Lowering blood glucose levels. What is my activity plan? Your health care provider or certified diabetes educator can help you make a plan for the type and frequency of exercise that works for you. This is called your activity plan. Be sure to:  Get at least 150 minutes of medium-intensity or high-intensity exercise each week. Exercises may include brisk walking, biking, or water aerobics.  Do stretching and strengthening exercises, such as yoga or weight lifting, at least 2 times a week.  Spread out your activity over at least 3 days of the week.  Get some form of physical activity each day. ? Do not go more than 2 days in a row without some kind of physical activity. ? Avoid being inactive for more than 90 minutes at a time. Take frequent breaks to walk or stretch.  Choose exercises or activities that you enjoy. Set realistic goals.  Start slowly and gradually increase your exercise intensity over time.   How do I manage my diabetes during exercise? Monitor your blood glucose  Check your blood glucose before and  after exercising. If your blood glucose is: ? 240 mg/dL (13.3 mmol/L) or higher before you exercise, check your urine for ketones. These are chemicals created by the liver. If you have ketones in your urine, do not exercise until your blood glucose returns to normal. ? 100 mg/dL (5.6 mmol/L) or lower, eat a snack containing 15-20 grams of carbohydrate. Check your blood glucose 15 minutes after the snack to make sure that your glucose level is above 100 mg/dL (5.6 mmol/L) before you start your exercise.  Know the symptoms of low blood glucose (hypoglycemia) and how to treat it. Your risk for hypoglycemia increases during and after exercise. Follow these tips and your health care provider's instructions  Keep a carbohydrate snack that is fast-acting for use before, during, and after exercise to help prevent or treat hypoglycemia.  Avoid injecting insulin into areas of the body that are going to be exercised. For example, avoid injecting insulin into: ? Your arms, when you are about to play tennis. ? Your legs, when you are about to go jogging.  Keep records of your exercise habits. Doing this can help you and your health care provider adjust your diabetes management plan as needed. Write down: ? Food that you eat before and after you exercise. ? Blood glucose levels before and after you exercise. ? The type and amount of exercise you have done.  Work with your health care provider when you start a new exercise or activity. He or she may need to: ? Make sure that the activity is safe for you. ? Adjust your insulin, other medicines, and food that   you eat.  Drink plenty of water while you exercise. This prevents loss of water (dehydration) and problems caused by a lot of heat in the body (heat stroke).   Where to find more information  American Diabetes Association: www.diabetes.org Summary  Exercising regularly is important for overall health, especially for people who have diabetes  mellitus.  Exercising has many health benefits. It increases muscle strength and bone density and reduces body fat and stress. It also lowers and controls blood glucose.  Your health care provider or certified diabetes educator can help you make an activity plan for the type and frequency of exercise that works for you.  Work with your health care provider to make sure any new activity is safe for you. Also work with your health care provider to adjust your insulin, other medicines, and the food you eat. This information is not intended to replace advice given to you by your health care provider. Make sure you discuss any questions you have with your health care provider. Document Revised: 12/14/2018 Document Reviewed: 12/14/2018 Elsevier Patient Education  2021 Elsevier Inc.  

## 2020-06-29 ENCOUNTER — Telehealth: Payer: Self-pay

## 2020-06-29 NOTE — Telephone Encounter (Signed)
Copied from [place] [id number]. Topic: Appointment Scheduling - Scheduling Inquiry for Clinic >> Jun 29, 2020 11:17 AM Tessa Lerner A wrote: Reason for CRM: Patient would like to be seen in office for a cough  Per DT, agent was unable to scheduled an appt with patient at the time of call  Please contact to further advise scheduling

## 2020-07-03 NOTE — Telephone Encounter (Signed)
NA, voicemail not set up. [facility] please ask patient if and when he calls back, as below.

## 2020-07-03 NOTE — Telephone Encounter (Signed)
Need details on cough to determine if appropriate for in office care vs virtual visit.  Duration? COVID test? Other symptoms?

## 2020-07-04 ENCOUNTER — Other Ambulatory Visit: Payer: Self-pay | Admitting: Physician Assistant

## 2020-07-04 DIAGNOSIS — Z981 Arthrodesis status: Secondary | ICD-10-CM

## 2020-07-04 DIAGNOSIS — M542 Cervicalgia: Secondary | ICD-10-CM

## 2020-07-05 ENCOUNTER — Encounter: Payer: Self-pay | Admitting: Family Medicine

## 2020-07-05 ENCOUNTER — Ambulatory Visit (INDEPENDENT_AMBULATORY_CARE_PROVIDER_SITE_OTHER): Payer: 59 | Admitting: Family Medicine

## 2020-07-05 DIAGNOSIS — I10 Essential (primary) hypertension: Secondary | ICD-10-CM

## 2020-07-05 DIAGNOSIS — R252 Cramp and spasm: Secondary | ICD-10-CM | POA: Diagnosis not present

## 2020-07-05 MED ORDER — LISINOPRIL 20 MG PO TABS: 30.0000 mg | ORAL_TABLET | Freq: Every day | ORAL | 3 refills | Status: DC

## 2020-07-05 NOTE — Progress Notes (Signed)
Virtual telephone visit    Virtual Visit via Telephone Note   This visit type was conducted due to national recommendations for restrictions regarding the COVID-19 Pandemic (e.g. social distancing) in an effort to limit this patient's exposure and mitigate transmission in our community. Due to his co-morbid illnesses, this patient is at least at moderate risk for complications without adequate follow up. This format is felt to be most appropriate for this patient at this time. The patient did not have access to video technology or had technical difficulties with video requiring transitioning to audio format only (telephone). Physical exam was limited to content and character of the telephone converstion.    Patient location: Home with no family members present Provider location: Home office  I discussed the limitations of evaluation and management by telemedicine and the availability of in person appointments. The patient expressed understanding and agreed to proceed.   Visit Date: 07/05/2020  Today's healthcare provider: Laurita Quint Senovia Gauer, FNP   Chief Complaint  Patient presents with  . Leg Pain    Patient presents in office today with concerns of cramping in his lower legs, patient states this has been an ongoing issue for over a year. Patient also has concerns of urinary frequency at night, he states that he recently saw PA Carles Collet who he states changed his blood pressure medication, he states that he was told that it would make him get up more in the evening to use restroom.     Subjective    HPI HPI    Leg Pain     Additional comments: Patient presents in office today with concerns of cramping in his lower legs, patient states this has been an ongoing issue for over a year. Patient also has concerns of urinary frequency at night, he states that he recently saw PA Carles Collet who he states changed his blood pressure medication, he states that he was told that it would make  him get up more in the evening to use restroom.         Last edited by Minette Headland, CMA on 07/05/2020  8:34 AM. (History)      Last seen 06/06/20: increased farxiga due to elevated A1c Added HCTZ to BP regimen Has leg cramping in the summer frequently Now has it nightly and every morning Makes it hard to get out of bed Has not tried anything for this Gets up to go to the bathroom frequently at night Takes HCTZ in the morning Night time urination is not new for him but is getting increasingly worse Drinks lots of fluid throughout the day Denies lower extremity swelling Has a history of a CABG Has been on crestor since that time Doesn't take BP at home The leg cramps are what bothers him the most He would like to know if he needs to make any changes to his electrolytes BP Readings from Last 3 Encounters:  06/06/20 (!) 149/92  02/04/20 (!) 156/81  10/08/19 134/76   Lab Results  Component Value Date   HGBA1C 7.6 (A) 06/06/2020       Medications: Outpatient Medications Prior to Visit  Medication Sig  . aspirin EC 325 MG EC tablet Take 1 tablet (325 mg total) by mouth daily.  . Blood Glucose Monitoring Suppl (ONE TOUCH ULTRA MINI) w/Device KIT USE AS DIRECTED  . clopidogrel (PLAVIX) 75 MG tablet TAKE 1 TABLET BY MOUTH EVERY DAY  . FARXIGA 10 MG TABS tablet TAKE 1 TABLET BY MOUTH  DAILY BEFORE BREAKFAST.  Marland Kitchen ibuprofen (ADVIL,MOTRIN) 800 MG tablet Take 1 tablet (800 mg total) by mouth every 8 (eight) hours as needed for mild pain or moderate pain.  . metoprolol succinate (TOPROL-XL) 100 MG 24 hr tablet Take 1 tablet (100 mg total) by mouth daily.  . ONE TOUCH ULTRA TEST test strip USE AS DIRECTED  . ONETOUCH DELICA LANCETS FINE MISC See admin instructions.  . rosuvastatin (CRESTOR) 20 MG tablet TAKE 1 TABLET BY MOUTH EVERY DAY  . traMADol (ULTRAM) 50 MG tablet Take one tablet twice daily as needed.  . [DISCONTINUED] lisinopril-hydrochlorothiazide (ZESTORETIC) 20-25 MG tablet  Take 1 tablet by mouth daily.   No facility-administered medications prior to visit.    Review of Systems  Constitutional: Negative.   HENT: Negative.   Respiratory: Negative.   Gastrointestinal: Negative.   Endocrine: Positive for polyuria.  Genitourinary: Positive for frequency.  Musculoskeletal: Positive for myalgias. Negative for arthralgias.   CMP Latest Ref Rng & Units 02/04/2020  Glucose 65 - 99 mg/dL 150 (H)  BUN 6 - 24 mg/dL 11  Creatinine 0.76 - 1.27 mg/dL 0.89  Sodium 134 - 144 mmol/L 138  Potassium 3.5 - 5.2 mmol/L 4.8  Chloride 96 - 106 mmol/L 100  CO2 20 - 29 mmol/L 23  Calcium 8.7 - 10.2 mg/dL 9.6  Total Protein 6.0 - 8.5 g/dL 7.0  Total Bilirubin 0.0 - 1.2 mg/dL 0.3  Alkaline Phos 44 - 121 IU/L 88  AST 0 - 40 IU/L 14  ALT 0 - 44 IU/L 20     No vital signs and limited physical exam due to virtual nature of this visit. Constitutional:      General: Not in acute distress. Pulmonary:     Effort: Pulmonary effort is normal. No respiratory distress.  Neurological:     Mental Status: He is alert and oriented to person, place, and time.  Psychiatric:        Mood and Affect: Mood normal.        Behavior: Behavior normal.     Assessment & Plan     Problem List Items Addressed This Visit      Cardiovascular and Mediastinum   Benign essential HTN - Primary   Relevant Medications   lisinopril (ZESTRIL) 20 MG tablet    Other Visit Diagnoses    Leg cramps       Relevant Orders   Comprehensive metabolic panel   Magnesium   Vitamin D, 25-hydroxy   CK     Plan Will follow up with lab results Stop Lisinopril/HCTZ combo medication and start lisinopril 30 mg daily Take BP at home daily, for goal< 130/80 Increase fluid intake as able  Return in about 2 months (around 09/04/2020).    I discussed the assessment and treatment plan with the patient. The patient was provided an opportunity to ask questions and all were answered. The patient agreed with the  plan and demonstrated an understanding of the instructions.   The patient was advised to call back or seek an in-person evaluation if the symptoms worsen or if the condition fails to improve as anticipated.  I provided 20 minutes of non-face-to-face time during this encounter.  [place], [place] [phone] (phone) [phone] (fax)  [place]

## 2020-07-05 NOTE — Patient Instructions (Signed)

## 2020-07-05 NOTE — Addendum Note (Signed)
Addended by: Ashley Royalty E on: 07/05/2020 02:01 PM   Modules accepted: Orders

## 2020-07-05 NOTE — Telephone Encounter (Signed)
Requested medications are due for refill today yes  Requested medications are on the active medication list yes  Last refill 06/07/20  Last visit 06/06/20  Future visit scheduled 7/12//22  Notes to clinic Not Delegated.

## 2020-07-06 ENCOUNTER — Other Ambulatory Visit: Payer: Self-pay | Admitting: Family Medicine

## 2020-07-06 DIAGNOSIS — E559 Vitamin D deficiency, unspecified: Secondary | ICD-10-CM

## 2020-07-06 LAB — COMPREHENSIVE METABOLIC PANEL
ALT: 19 IU/L (ref 0–44)
AST: 19 IU/L (ref 0–40)
Albumin/Globulin Ratio: 1.6 (ref 1.2–2.2)
Albumin: 4.3 g/dL (ref 3.8–4.9)
Alkaline Phosphatase: 99 IU/L (ref 44–121)
BUN/Creatinine Ratio: 21 — ABNORMAL HIGH (ref 9–20)
BUN: 20 mg/dL (ref 6–24)
Bilirubin Total: 0.2 mg/dL (ref 0.0–1.2)
CO2: 21 mmol/L (ref 20–29)
Calcium: 9.4 mg/dL (ref 8.7–10.2)
Chloride: 98 mmol/L (ref 96–106)
Creatinine, Ser: 0.96 mg/dL (ref 0.76–1.27)
Globulin, Total: 2.7 g/dL (ref 1.5–4.5)
Glucose: 196 mg/dL — ABNORMAL HIGH (ref 65–99)
Potassium: 4.9 mmol/L (ref 3.5–5.2)
Sodium: 138 mmol/L (ref 134–144)
Total Protein: 7 g/dL (ref 6.0–8.5)
eGFR: 92 mL/min/{1.73_m2} (ref >59)

## 2020-07-06 LAB — MAGNESIUM: Magnesium: 2 mg/dL (ref 1.6–2.3)

## 2020-07-06 LAB — VITAMIN D 25 HYDROXY (VIT D DEFICIENCY, FRACTURES): Vit D, 25-Hydroxy: 17.4 ng/mL — ABNORMAL LOW (ref 30.0–100.0)

## 2020-07-06 LAB — CK: Total CK: 198 U/L (ref 41–331)

## 2020-07-06 MED ORDER — VITAMIN D (ERGOCALCIFEROL) 1.25 MG (50000 UNIT) PO CAPS
50000.0000 [IU] | ORAL_CAPSULE | ORAL | 1 refills | Status: DC
Start: 2020-07-06 — End: 2021-01-10

## 2020-08-01 ENCOUNTER — Other Ambulatory Visit: Payer: Self-pay | Admitting: Family Medicine

## 2020-08-01 DIAGNOSIS — Z981 Arthrodesis status: Secondary | ICD-10-CM

## 2020-08-01 DIAGNOSIS — M542 Cervicalgia: Secondary | ICD-10-CM

## 2020-08-01 NOTE — Telephone Encounter (Signed)
Requested medication (s) are due for refill today: yes  Requested medication (s) are on the active medication list: yes  Last refill:  07/05/20  Future visit scheduled: yes  Notes to clinic:  Med not delegated to NT to RF   Requested Prescriptions  Pending Prescriptions Disp Refills   traMADol (ULTRAM) 50 MG tablet [Pharmacy Med Name: TRAMADOL HCL 50 MG TABLET] 60 tablet 0    Sig: TAKE 1 TABLET BY MOUTH TWICE A DAY AS NEEDED      Not Delegated - Analgesics:  Opioid Agonists Failed - 08/01/2020  3:32 PM      Failed - This refill cannot be delegated      Failed - Urine Drug Screen completed in last 360 days      Passed - Valid encounter within last 6 months    Recent Outpatient Visits           3 weeks ago Benign essential HTN   [place] Just, Laurita Quint, FNP   1 month ago Diabetes mellitus without complication [facility])   [facility] [place], [place], PA-C   5 months ago Annual physical exam   [facility] Carles Collet M, PA-C   9 months ago Diabetes mellitus without complication [facility])   [facility] [place], [place], PA-C   1 year ago Diabetes mellitus without complication [facility])   [facility] Trinna Post, [place]       Future Appointments             In 2 months Jerrol Banana., MD [facility], [place]

## 2020-08-30 ENCOUNTER — Other Ambulatory Visit: Payer: Self-pay | Admitting: Family Medicine

## 2020-08-30 DIAGNOSIS — M542 Cervicalgia: Secondary | ICD-10-CM

## 2020-08-30 DIAGNOSIS — Z981 Arthrodesis status: Secondary | ICD-10-CM

## 2020-08-30 NOTE — Telephone Encounter (Signed)
Requested medication (s) are due for refill today: yes  Requested medication (s) are on the active medication list: yes   Last refill: 08/02/2020  Future visit scheduled:yes  Notes to clinic:  this refill cannot be delegated   Requested Prescriptions  Pending Prescriptions Disp Refills   traMADol (ULTRAM) 50 MG tablet [Pharmacy Med Name: TRAMADOL HCL 50 MG TABLET] 60 tablet 0    Sig: TAKE 1 TABLET BY MOUTH TWICE A DAY AS NEEDED      Not Delegated - Analgesics:  Opioid Agonists Failed - 08/30/2020 10:24 AM      Failed - This refill cannot be delegated      Failed - Urine Drug Screen completed in last 360 days      Passed - Valid encounter within last 6 months    Recent Outpatient Visits           1 month ago Benign essential HTN   [place] Just, Laurita Quint, FNP   2 months ago Diabetes mellitus without complication [facility])   [facility] [place], Fabio Bering M, PA-C   6 months ago Annual physical exam   [facility] Carles Collet M, PA-C   10 months ago Diabetes mellitus without complication [facility])   [facility] [place], [place], PA-C   1 year ago Diabetes mellitus without complication [facility])   [facility] Trinna Post, [place]       Future Appointments             In 1 month Jerrol Banana., MD [facility], [place]

## 2020-09-01 ENCOUNTER — Telehealth: Payer: Self-pay

## 2020-09-01 DIAGNOSIS — I25810 Atherosclerosis of coronary artery bypass graft(s) without angina pectoris: Secondary | ICD-10-CM

## 2020-09-01 MED ORDER — CLOPIDOGREL BISULFATE 75 MG PO TABS: 1.0000 | ORAL_TABLET | Freq: Every day | ORAL | 1 refills | Status: DC

## 2020-09-01 NOTE — Telephone Encounter (Signed)
[facility] faxed refill request for the following medications: ° °clopidogrel (PLAVIX) 75 MG tablet ° ° °Please advise. °

## 2020-09-04 ENCOUNTER — Telehealth: Payer: Self-pay | Admitting: Physician Assistant

## 2020-09-04 DIAGNOSIS — E119 Type 2 diabetes mellitus without complications: Secondary | ICD-10-CM

## 2020-09-04 MED ORDER — DAPAGLIFLOZIN PROPANEDIOL 10 MG PO TABS: 10.0000 mg | ORAL_TABLET | Freq: Every day | ORAL | 0 refills | Status: DC

## 2020-09-04 NOTE — Telephone Encounter (Signed)
[facility] for the following medications:  FARXIGA 10 MG TABS tablet  Please advise. Thanks TNP

## 2020-09-04 NOTE — Telephone Encounter (Signed)
Requested medication (s) are due for refill today: yes  Requested medication (s) are on the active medication list:yes  Last refill:  08/02/20  Future visit scheduled: yes  Notes to clinic: not delegated   Requested Prescriptions  Pending Prescriptions Disp Refills   traMADol (ULTRAM) 50 MG tablet [Pharmacy Med Name: TRAMADOL HCL 50 MG TABLET] 60 tablet 0    Sig: TAKE 1 TABLET BY MOUTH TWICE A DAY AS NEEDED      Not Delegated - Analgesics:  Opioid Agonists Failed - 09/04/2020  2:53 PM      Failed - This refill cannot be delegated      Failed - Urine Drug Screen completed in last 360 days      Passed - Valid encounter within last 6 months    Recent Outpatient Visits           2 months ago Benign essential HTN   [place] Just, Laurita Quint, FNP   3 months ago Diabetes mellitus without complication [facility])   [facility] [place], Wendee Beavers, PA-C   7 months ago Annual physical exam   [facility] Carles Collet M, [place]   11 months ago Diabetes mellitus without complication [facility])   [facility] [place], [place], PA-C   1 year ago Diabetes mellitus without complication [facility])   [facility] Trinna Post, [place]       Future Appointments             In 1 month Jerrol Banana., MD [facility], [place]

## 2020-09-04 NOTE — Telephone Encounter (Signed)
Copied from [place] [id number]. Topic: Quick Communication - Rx Refill/Question >> Sep 04, 2020  2:51 PM Leward Quan A wrote: Medication: traMADol (ULTRAM) 50 MG tablet   Has the patient contacted their pharmacy? Yes.   (Agent: If no, request that the patient contact the pharmacy for the refill.) (Agent: If yes, when and what did the pharmacy advise?)  Preferred Pharmacy (with phone number or street name): [id number] [place], [address]  Phone:  [phone] Fax:  [phone]     Agent: Please be advised that RX refills may take up to 3 business days. We ask that you follow-up with your pharmacy.

## 2020-09-04 NOTE — Telephone Encounter (Signed)
Copied from [place] [id number]. Topic: Quick Communication - Rx Refill/Question >> Sep 04, 2020  2:48 PM Leward Quan A wrote: VOID   Has the patient contacted their pharmacy?   (Agent: If no, request that the patient contact the pharmacy for the refill.) (Agent: If yes, when and what did the pharmacy advise?)  Preferred Pharmacy (with phone number or street name):      Agent: Please be advised that RX refills may take up to 3 business days. We ask that you follow-up with your pharmacy.

## 2020-10-03 ENCOUNTER — Other Ambulatory Visit: Payer: Self-pay | Admitting: Family Medicine

## 2020-10-03 DIAGNOSIS — M542 Cervicalgia: Secondary | ICD-10-CM

## 2020-10-03 DIAGNOSIS — Z981 Arthrodesis status: Secondary | ICD-10-CM

## 2020-10-03 NOTE — Telephone Encounter (Signed)
Requested medication (s) are due for refill today: yes  Requested medication (s) are on the active medication list: yes  Last refill:  09/05/2020  Future visit scheduled: yes   Notes to clinic:  This refill cannot be delegated Requested Prescriptions  Pending Prescriptions Disp Refills   traMADol (ULTRAM) 50 MG tablet [Pharmacy Med Name: TRAMADOL HCL 50 MG TABLET] 60 tablet 0    Sig: TAKE 1 TABLET BY MOUTH TWICE A DAY AS NEEDED      Not Delegated - Analgesics:  Opioid Agonists Failed - 10/03/2020 10:01 AM      Failed - This refill cannot be delegated      Failed - Urine Drug Screen completed in last 360 days      Passed - Valid encounter within last 6 months    Recent Outpatient Visits           3 months ago Benign essential HTN   [place] Just, Laurita Quint, FNP   3 months ago Diabetes mellitus without complication [facility])   [facility] [place], [place], PA-C   8 months ago Annual physical exam   [facility] Carles Collet M, [place]   12 months ago Diabetes mellitus without complication [facility])   [facility] [place], [place], [place]   1 year ago Diabetes mellitus without complication [facility])   [facility] Trinna Post, [place]       Future Appointments             In 1 week Jerrol Banana., MD [facility], [facility]

## 2020-10-09 NOTE — Progress Notes (Signed)
Established patient visit   Patient: Nathan Hull   DOB: 06/19/1960   60 y.o. Male  MRN: [id number] Visit Date: 10/10/2020  Today's healthcare provider: Wilhemena Durie, MD   Chief Complaint  Patient presents with   Diabetes   Hypertension   Hyperlipidemia   Subjective    HPI  She comes in today for follow-up of diabetes and heart disease.  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 Diabetes Mellitus Type II, follow-up  Lab Results  Component Value Date   HGBA1C 7.6 (A) 10/10/2020   HGBA1C 7.6 (A) 06/06/2020   HGBA1C 7.6 (A) 02/04/2020   Last seen for diabetes 4 months ago.  Management since then includes increasing Farxiga to 49m daily. He reports good compliance with treatment. He is not having side effects.   Home blood sugar records: trend: fluctuating a bit  Episodes of hypoglycemia? No    Current insulin regiment: none Most Recent Eye Exam: due  --------------------------------------------------------------------------------------------------- Hypertension, follow-up  BP Readings from Last 3 Encounters:  10/10/20 (!) 149/81  06/06/20 (!) 149/92  02/04/20 (!) 156/81   Wt Readings from Last 3 Encounters:  10/10/20 286 lb (129.7 kg)  06/06/20 287 lb (130.2 kg)  02/04/20 288 lb (130.6 kg)     He was last seen for hypertension 4 months ago.  BP at that visit was 149/92. Management since that visit includes stopping Lisinopril/HCTZ and starting Lisinopril 328mdaily . He reports good compliance with treatment. He is not having side effects.  He is not exercising. He is adherent to low salt diet.   Outside blood pressures are not being checked.  He does not smoke.  Use of agents associated with hypertension: none.   Lipid/Cholesterol, follow-up  Last Lipid Panel: Lab Results  Component Value Date   CHOL 130 02/04/2020   LDLCALC 65 02/04/2020   HDL 30 (L) 02/04/2020   TRIG 210 (H) 02/04/2020    He was last  seen for this 4 months ago.  Management since that visit includes no changes.  He reports good compliance with treatment. He is not having side effects.   Symptoms: No appetite changes No foot ulcerations  No chest pain No chest pressure/discomfort  No dyspnea No orthopnea  No fatigue No lower extremity edema  No palpitations No paroxysmal nocturnal dyspnea  No nausea No numbness or tingling of extremity  No polydipsia No polyuria  No speech difficulty No syncope   He is following a Regular diet. Current exercise: no regular exercise  Last metabolic panel Lab Results  Component Value Date   GLUCOSE 196 (H) 07/05/2020   NA 138 07/05/2020   K 4.9 07/05/2020   BUN 20 07/05/2020   CREATININE 0.96 07/05/2020   GFRNONAA 95 02/04/2020   GFRAA 110 02/04/2020   CALCIUM 9.4 07/05/2020   AST 19 07/05/2020   ALT 19 07/05/2020   The 10-year ASCVD risk score (GMikey BussingC Jr., et al., 2013) is: 19.6%      Medications: Outpatient Medications Prior to Visit  Medication Sig   aspirin EC 325 MG EC tablet Take 1 tablet (325 mg total) by mouth daily.   Blood Glucose Monitoring Suppl (ONE TOUCH ULTRA MINI) w/Device KIT USE AS DIRECTED   clopidogrel (PLAVIX) 75 MG tablet Take 1 tablet (75 mg total) by mouth daily.   dapagliflozin propanediol (FARXIGA) 10 MG TABS tablet Take 1 tablet (10 mg total) by mouth daily before breakfast.  ibuprofen (ADVIL,MOTRIN) 800 MG tablet Take 1 tablet (800 mg total) by mouth every 8 (eight) hours as needed for mild pain or moderate pain.   lisinopril (ZESTRIL) 20 MG tablet Take 1.5 tablets (30 mg total) by mouth daily.   metoprolol succinate (TOPROL-XL) 100 MG 24 hr tablet Take 1 tablet (100 mg total) by mouth daily.   ONE TOUCH ULTRA TEST test strip USE AS DIRECTED   ONETOUCH DELICA LANCETS FINE MISC See admin instructions.   rosuvastatin (CRESTOR) 20 MG tablet TAKE 1 TABLET BY MOUTH EVERY DAY   traMADol (ULTRAM) 50 MG tablet TAKE 1 TABLET BY MOUTH TWICE A DAY  AS NEEDED   Vitamin D, Ergocalciferol, (DRISDOL) 1.25 MG (50000 UNIT) CAPS capsule Take 1 capsule (50,000 Units total) by mouth every 7 (seven) days.   No facility-administered medications prior to visit.    Review of Systems  Constitutional:  Negative for activity change and fatigue.  Respiratory:  Negative for cough and shortness of breath.   Cardiovascular:  Negative for chest pain, palpitations and leg swelling.  Endocrine: Negative for cold intolerance, heat intolerance, polydipsia, polyphagia and polyuria.  Neurological:  Negative for dizziness, light-headedness and headaches.  Psychiatric/Behavioral:  Negative for agitation, self-injury, sleep disturbance and suicidal ideas. The patient is not nervous/anxious and is not hyperactive.        Objective    BP (!) 149/81   Pulse (!) 57   Temp 98.1 F (36.7 C)   Resp 16   Ht 6' (1.829 m)   Wt 286 lb (129.7 kg)   BMI 38.79 kg/m  BP Readings from Last 3 Encounters:  10/10/20 (!) 149/81  06/06/20 (!) 149/92  02/04/20 (!) 156/81   Wt Readings from Last 3 Encounters:  10/10/20 286 lb (129.7 kg)  06/06/20 287 lb (130.2 kg)  02/04/20 288 lb (130.6 kg)       Physical Exam Constitutional:      Appearance: Normal appearance.  HENT:     Right Ear: External ear normal.     Left Ear: External ear normal.  Eyes:     General: No scleral icterus.    Conjunctiva/sclera: Conjunctivae normal.  Cardiovascular:     Rate and Rhythm: Normal rate and regular rhythm.     Heart sounds: Normal heart sounds.  Pulmonary:     Effort: Pulmonary effort is normal.     Breath sounds: Normal breath sounds.  Skin:    General: Skin is warm and dry.  Neurological:     Mental Status: He is alert and oriented to person, place, and time. Mental status is at baseline.  Psychiatric:        Mood and Affect: Mood normal.        Behavior: Behavior normal.      Results for orders placed or performed in visit on 10/10/20  POCT glycosylated  hemoglobin (Hb A1C)  Result Value Ref Range   Hemoglobin A1C 7.6 (A) 4.0 - 5.6 %   HbA1c POC (<> result, manual entry)     HbA1c, POC (prediabetic range)     HbA1c, POC (controlled diabetic range)      Assessment & Plan     1. Diabetes mellitus without complication ([place]) Stable control with A1c of 7.6.  Continue Farxiga  - POCT glycosylated hemoglobin (Hb A1C)  2. Coronary artery disease of bypass graft of native heart with stable angina pectoris ([place]) All risk factors treated  3. Benign essential HTN Increase lisinopril from 20 to 40 mg daily. -  lisinopril (ZESTRIL) 40 MG tablet; Take 1 tablet (40 mg total) by mouth daily.  Dispense: 30 tablet; Refill: 5  4. Mixed hyperlipidemia On rosuvastatin 20   No follow-ups on file.      I, Wilhemena Durie, MD, have reviewed all documentation for this visit. The documentation on 10/14/20 for the exam, diagnosis, procedures, and orders are all accurate and complete.    Kymari Nuon Cranford Mon, MD  [facility] [phone] (phone) [phone] (fax)  [place]

## 2020-10-10 ENCOUNTER — Encounter: Payer: Self-pay | Admitting: Family Medicine

## 2020-10-10 ENCOUNTER — Ambulatory Visit: Payer: 59 | Admitting: Family Medicine

## 2020-10-10 ENCOUNTER — Other Ambulatory Visit: Payer: Self-pay

## 2020-10-10 VITALS — BP 149/81 | HR 57 | Temp 98.1°F | Resp 16 | Ht 72.0 in | Wt 286.0 lb

## 2020-10-10 DIAGNOSIS — E782 Mixed hyperlipidemia: Secondary | ICD-10-CM | POA: Diagnosis not present

## 2020-10-10 DIAGNOSIS — I25708 Atherosclerosis of coronary artery bypass graft(s), unspecified, with other forms of angina pectoris: Secondary | ICD-10-CM

## 2020-10-10 DIAGNOSIS — E119 Type 2 diabetes mellitus without complications: Secondary | ICD-10-CM | POA: Diagnosis not present

## 2020-10-10 DIAGNOSIS — I10 Essential (primary) hypertension: Secondary | ICD-10-CM

## 2020-10-10 LAB — POCT GLYCOSYLATED HEMOGLOBIN (HGB A1C): Hemoglobin A1C: 7.6 % — AB (ref 4.0–5.6)

## 2020-10-10 MED ORDER — LISINOPRIL 40 MG PO TABS: 40.0000 mg | ORAL_TABLET | Freq: Every day | ORAL | 5 refills | Status: DC

## 2020-10-26 ENCOUNTER — Telehealth: Payer: Self-pay

## 2020-10-26 DIAGNOSIS — I10 Essential (primary) hypertension: Secondary | ICD-10-CM

## 2020-10-26 MED ORDER — METOPROLOL SUCCINATE ER 100 MG PO TB24: 100.0000 mg | ORAL_TABLET | Freq: Every day | ORAL | 1 refills | Status: DC

## 2020-10-26 NOTE — Telephone Encounter (Signed)
[facility] for the following medications:  metoprolol succinate (TOPROL-XL) 100 MG 24 hr tablet   Please advise.

## 2020-11-01 ENCOUNTER — Other Ambulatory Visit: Payer: Self-pay | Admitting: Family Medicine

## 2020-11-01 DIAGNOSIS — Z981 Arthrodesis status: Secondary | ICD-10-CM

## 2020-11-01 DIAGNOSIS — M542 Cervicalgia: Secondary | ICD-10-CM

## 2020-11-01 NOTE — Telephone Encounter (Signed)
Requested medication (s) are due for refill today: Due 11/05/20  Requested medication (s) are on the active medication list: yes  Last refill: 10/05/20  # 60  0 refills  Future visit scheduled yes 04/12/20  Notes to clinic   not delegated  Requested Prescriptions  Pending Prescriptions Disp Refills   traMADol (ULTRAM) 50 MG tablet [Pharmacy Med Name: TRAMADOL HCL 50 MG TABLET] 60 tablet 0    Sig: TAKE 1 TABLET BY MOUTH TWICE A DAY AS NEEDED      Not Delegated - Analgesics:  Opioid Agonists Failed - 11/01/2020  9:45 AM      Failed - This refill cannot be delegated      Failed - Urine Drug Screen completed in last 360 days      Passed - Valid encounter within last 6 months    Recent Outpatient Visits           3 weeks ago Diabetes mellitus without complication [facility])   [facility] Jerrol Banana., MD   3 months ago Benign essential HTN   [place] Just, Laurita Quint, FNP   4 months ago Diabetes mellitus without complication [facility])   [facility] [place], Fabio Bering M, PA-C   9 months ago Annual physical exam   [facility] Carles Collet M, PA-C   1 year ago Diabetes mellitus without complication [facility])   [place], Wendee Beavers, [place]       Future Appointments             In 5 months Gwyneth Sprout, [place], [place]

## 2020-11-06 NOTE — Telephone Encounter (Signed)
Patient has made an additional call regarding their prescription coordination  Please contact further when possible

## 2020-11-29 ENCOUNTER — Other Ambulatory Visit: Payer: Self-pay | Admitting: Family Medicine

## 2020-11-29 DIAGNOSIS — E119 Type 2 diabetes mellitus without complications: Secondary | ICD-10-CM

## 2020-12-05 ENCOUNTER — Other Ambulatory Visit: Payer: Self-pay | Admitting: Family Medicine

## 2020-12-05 DIAGNOSIS — M542 Cervicalgia: Secondary | ICD-10-CM

## 2020-12-05 DIAGNOSIS — Z981 Arthrodesis status: Secondary | ICD-10-CM

## 2020-12-05 NOTE — Telephone Encounter (Signed)
Requested medication (s) are due for refill today: yes  Requested medication (s) are on the active medication list: yes  Last refill:  11/06/20 #60  Future visit scheduled: yes  Notes to clinic:  Please review for refill. Refill not delegated per protocol    Requested Prescriptions  Pending Prescriptions Disp Refills   traMADol (ULTRAM) 50 MG tablet [Pharmacy Med Name: TRAMADOL HCL 50 MG TABLET] 60 tablet 0    Sig: TAKE 1 TABLET BY MOUTH TWICE A DAY AS NEEDED     Not Delegated - Analgesics:  Opioid Agonists Failed - 12/05/2020  3:08 PM      Failed - This refill cannot be delegated      Failed - Urine Drug Screen completed in last 360 days      Passed - Valid encounter within last 6 months    Recent Outpatient Visits           1 month ago Diabetes mellitus without complication [facility])   [facility] Jerrol Banana., MD   5 months ago Benign essential HTN   [place] Just, Laurita Quint, FNP   6 months ago Diabetes mellitus without complication [facility])   [facility] Carles Collet M, PA-C   10 months ago Annual physical exam   [facility] Carles Collet M, PA-C   1 year ago Diabetes mellitus without complication [facility])   [place], Wendee Beavers, [place]       Future Appointments             In 4 months Gwyneth Sprout, [place], [place]

## 2020-12-06 NOTE — Telephone Encounter (Signed)
Please advise refill?  LOV: 10/10/2020 NOV: 04/12/2021 Last refill: 11/06/2020 #60 w/0 refills

## 2020-12-11 ENCOUNTER — Telehealth: Payer: Self-pay

## 2020-12-11 NOTE — Telephone Encounter (Signed)
Copied from [place] [id number]. Topic: General - Inquiry >> Dec 11, 2020 12:23 PM [place] wrote: Reason for CRM: [facility] in [place] calling stating wanted to get pt to be seen asap as thinks pt has shingles, wants a fu call to pt  to advise

## 2020-12-11 NOTE — Telephone Encounter (Signed)
Looks like appt has been scheduled for patient to see Dr. Rosanna Randy on tomorrow 12/12/2020 at 1pm.

## 2020-12-12 ENCOUNTER — Ambulatory Visit: Payer: 59 | Admitting: Family Medicine

## 2020-12-12 ENCOUNTER — Other Ambulatory Visit: Payer: Self-pay

## 2020-12-12 ENCOUNTER — Encounter: Payer: Self-pay | Admitting: Family Medicine

## 2020-12-12 VITALS — BP 166/93 | HR 69 | Resp 16 | Wt 291.0 lb

## 2020-12-12 DIAGNOSIS — B0230 Zoster ocular disease, unspecified: Secondary | ICD-10-CM | POA: Diagnosis not present

## 2020-12-12 MED ORDER — HYDROCODONE-ACETAMINOPHEN 5-325 MG PO TABS: 1.0000 | ORAL_TABLET | ORAL | 0 refills | Status: DC | PRN

## 2020-12-12 MED ORDER — GABAPENTIN 100 MG PO CAPS: 100.0000 mg | ORAL_CAPSULE | Freq: Three times a day (TID) | ORAL | 3 refills | Status: DC

## 2020-12-12 NOTE — Progress Notes (Signed)
   I,April Miller,acting as a scribe for   Jr, MD.,have documented all relevant documentation on the behalf of   Jr, MD,as directed by    Jr, MD while in the presence of   Jr, MD.   Established patient visit   Patient: Nathan Hull   DOB: 05/14/1960   60 y.o. Male  MRN: [id number] Visit Date: 12/12/2020  Today's healthcare provider:   Jr, MD   Chief Complaint  Patient presents with   Herpes Zoster   Subjective    HPI  Patient has had shingles in and round his right eye for one week. Patient has been seeing Dr. Woodard. Started a week ago.  Dr. Woodard is taking care of the eye. It is an itching burning pain but not severe.    Medications: Outpatient Medications Prior to Visit  Medication Sig   aspirin EC 325 MG EC tablet Take 1 tablet (325 mg total) by mouth daily.   Blood Glucose Monitoring Suppl (ONE TOUCH ULTRA MINI) w/Device KIT USE AS DIRECTED   cephALEXin (KEFLEX) 500 MG capsule Take by mouth.   clopidogrel (PLAVIX) 75 MG tablet Take 1 tablet (75 mg total) by mouth daily.   FARXIGA 10 MG TABS tablet TAKE 1 TABLET BY MOUTH DAILY BEFORE BREAKFAST.   ibuprofen (ADVIL,MOTRIN) 800 MG tablet Take 1 tablet (800 mg total) by mouth every 8 (eight) hours as needed for mild pain or moderate pain.   lisinopril (ZESTRIL) 40 MG tablet Take 1 tablet (40 mg total) by mouth daily.   metoprolol succinate (TOPROL-XL) 100 MG 24 hr tablet Take 1 tablet (100 mg total) by mouth daily.   ONE TOUCH ULTRA TEST test strip USE AS DIRECTED   ONETOUCH DELICA LANCETS FINE MISC See admin instructions.   rosuvastatin (CRESTOR) 20 MG tablet TAKE 1 TABLET BY MOUTH EVERY DAY   traMADol (ULTRAM) 50 MG tablet TAKE 1 TABLET BY MOUTH TWICE A DAY AS NEEDED   valACYclovir (VALTREX) 1000 MG tablet Take 1,000 mg by mouth 3 (three) times daily.   Vitamin D, Ergocalciferol, (DRISDOL) 1.25 MG (50000 UNIT) CAPS capsule Take 1 capsule (50,000  Units total) by mouth every 7 (seven) days.   No facility-administered medications prior to visit.    Review of Systems      Objective    BP (!) 166/93 (BP Location: Left Arm, Patient Position: Sitting, Cuff Size: Large)   Pulse 69   Resp 16   Wt 291 lb (132 kg)   SpO2 96%   BMI 39.47 kg/m  BP Readings from Last 3 Encounters:  12/12/20 (!) 166/93  10/10/20 (!) 149/81  06/06/20 (!) 149/92   Wt Readings from Last 3 Encounters:  12/12/20 291 lb (132 kg)  10/10/20 286 lb (129.7 kg)  06/06/20 287 lb (130.2 kg)      Physical Exam Vitals reviewed.  Constitutional:      General: He is not in acute distress.    Appearance: He is well-developed.  HENT:     Head: Normocephalic and atraumatic.     Right Ear: Hearing normal.     Left Ear: Hearing normal.     Nose: Nose normal.  Eyes:     General: Lids are normal. No scleral icterus.       Right eye: No discharge.        Left eye: No discharge.     Conjunctiva/sclera: Conjunctivae normal.  Cardiovascular:     Rate and Rhythm: Normal rate and regular rhythm.       Heart sounds: Normal heart sounds.  Pulmonary:     Effort: Pulmonary effort is normal. No respiratory distress.  Skin:    Findings: No lesion or rash.     Comments: Rash of right forehead and to the region of the right eye consistent with shingles  Neurological:     General: No focal deficit present.     Mental Status: He is alert and oriented to person, place, and time.  Psychiatric:        Mood and Affect: Mood normal.        Speech: Speech normal.        Behavior: Behavior normal.        Thought Content: Thought content normal.        Judgment: Judgment normal.      No results found for any visits on 12/12/20.  Assessment & Plan     1. Herpes zoster with ophthalmic complication, unspecified herpes zoster eye disease Treat with gabapentin 100 mg nightly titrating up to 300 mg at night.  Treat with Valtrex for a week.  Norco 5/325 every 4 hours as needed  #35 with no refills.  Follow-up in a week.   No follow-ups on file.      I, Wilhemena Durie, MD, have reviewed all documentation for this visit. The documentation on 12/17/20 for the exam, diagnosis, procedures, and orders are all accurate and complete.    Abel Hageman Cranford Mon, MD  [facility] [phone] (phone) [phone] (fax)  [place]

## 2020-12-25 ENCOUNTER — Ambulatory Visit: Payer: Self-pay | Admitting: *Deleted

## 2020-12-25 ENCOUNTER — Encounter: Payer: Self-pay | Admitting: Emergency Medicine

## 2020-12-25 ENCOUNTER — Emergency Department: Payer: 59

## 2020-12-25 ENCOUNTER — Inpatient Hospital Stay
Admission: EM | Admit: 2020-12-25 | Discharge: 2020-12-27 | DRG: 076 | Disposition: A | Discharge status: Home Health | Payer: 59 | Attending: Internal Medicine | Admitting: Internal Medicine

## 2020-12-25 ENCOUNTER — Ambulatory Visit: Payer: Self-pay

## 2020-12-25 ENCOUNTER — Telehealth: Payer: Self-pay

## 2020-12-25 ENCOUNTER — Other Ambulatory Visit: Payer: Self-pay

## 2020-12-25 ENCOUNTER — Observation Stay: Payer: 59

## 2020-12-25 DIAGNOSIS — G459 Transient cerebral ischemic attack, unspecified: Secondary | ICD-10-CM

## 2020-12-25 DIAGNOSIS — B003 Herpesviral meningitis: Secondary | ICD-10-CM | POA: Diagnosis not present

## 2020-12-25 DIAGNOSIS — Z794 Long term (current) use of insulin: Secondary | ICD-10-CM

## 2020-12-25 DIAGNOSIS — B028 Zoster with other complications: Secondary | ICD-10-CM

## 2020-12-25 DIAGNOSIS — Z20822 Contact with and (suspected) exposure to covid-19: Secondary | ICD-10-CM | POA: Diagnosis present

## 2020-12-25 DIAGNOSIS — Z833 Family history of diabetes mellitus: Secondary | ICD-10-CM

## 2020-12-25 DIAGNOSIS — E785 Hyperlipidemia, unspecified: Secondary | ICD-10-CM | POA: Diagnosis present

## 2020-12-25 DIAGNOSIS — Q670 Congenital facial asymmetry: Secondary | ICD-10-CM | POA: Diagnosis not present

## 2020-12-25 DIAGNOSIS — Z79899 Other long term (current) drug therapy: Secondary | ICD-10-CM

## 2020-12-25 DIAGNOSIS — Z8249 Family history of ischemic heart disease and other diseases of the circulatory system: Secondary | ICD-10-CM

## 2020-12-25 DIAGNOSIS — B019 Varicella without complication: Secondary | ICD-10-CM | POA: Diagnosis not present

## 2020-12-25 DIAGNOSIS — R29810 Facial weakness: Secondary | ICD-10-CM | POA: Diagnosis not present

## 2020-12-25 DIAGNOSIS — E559 Vitamin D deficiency, unspecified: Secondary | ICD-10-CM | POA: Diagnosis present

## 2020-12-25 DIAGNOSIS — Z7982 Long term (current) use of aspirin: Secondary | ICD-10-CM

## 2020-12-25 DIAGNOSIS — Z888 Allergy status to other drugs, medicaments and biological substances status: Secondary | ICD-10-CM

## 2020-12-25 DIAGNOSIS — I4510 Unspecified right bundle-branch block: Secondary | ICD-10-CM | POA: Diagnosis present

## 2020-12-25 DIAGNOSIS — Z7952 Long term (current) use of systemic steroids: Secondary | ICD-10-CM

## 2020-12-25 DIAGNOSIS — I6789 Other cerebrovascular disease: Secondary | ICD-10-CM | POA: Diagnosis present

## 2020-12-25 DIAGNOSIS — Z951 Presence of aortocoronary bypass graft: Secondary | ICD-10-CM

## 2020-12-25 DIAGNOSIS — E669 Obesity, unspecified: Secondary | ICD-10-CM | POA: Diagnosis present

## 2020-12-25 DIAGNOSIS — I2510 Atherosclerotic heart disease of native coronary artery without angina pectoris: Secondary | ICD-10-CM | POA: Diagnosis present

## 2020-12-25 DIAGNOSIS — B0222 Postherpetic trigeminal neuralgia: Secondary | ICD-10-CM | POA: Diagnosis not present

## 2020-12-25 DIAGNOSIS — B029 Zoster without complications: Secondary | ICD-10-CM | POA: Diagnosis present

## 2020-12-25 DIAGNOSIS — I252 Old myocardial infarction: Secondary | ICD-10-CM

## 2020-12-25 DIAGNOSIS — G510 Bell's palsy: Secondary | ICD-10-CM

## 2020-12-25 DIAGNOSIS — M1990 Unspecified osteoarthritis, unspecified site: Secondary | ICD-10-CM | POA: Diagnosis present

## 2020-12-25 DIAGNOSIS — I10 Essential (primary) hypertension: Secondary | ICD-10-CM | POA: Diagnosis not present

## 2020-12-25 DIAGNOSIS — Z87442 Personal history of urinary calculi: Secondary | ICD-10-CM

## 2020-12-25 DIAGNOSIS — Z6830 Body mass index (BMI) 30.0-30.9, adult: Secondary | ICD-10-CM

## 2020-12-25 DIAGNOSIS — Z8673 Personal history of transient ischemic attack (TIA), and cerebral infarction without residual deficits: Secondary | ICD-10-CM

## 2020-12-25 DIAGNOSIS — E119 Type 2 diabetes mellitus without complications: Secondary | ICD-10-CM

## 2020-12-25 DIAGNOSIS — G0490 Encephalitis and encephalomyelitis, unspecified: Secondary | ICD-10-CM

## 2020-12-25 DIAGNOSIS — Z87891 Personal history of nicotine dependence: Secondary | ICD-10-CM

## 2020-12-25 DIAGNOSIS — I69992 Facial weakness following unspecified cerebrovascular disease: Secondary | ICD-10-CM | POA: Insufficient documentation

## 2020-12-25 HISTORY — DX: Fusion of spine, cervical region: M43.22

## 2020-12-25 HISTORY — DX: Carpal tunnel syndrome, unspecified upper limb: G56.00

## 2020-12-25 LAB — COMPREHENSIVE METABOLIC PANEL
ALT: 24 U/L (ref 0–44)
AST: 26 U/L (ref 15–41)
Albumin: 4.2 g/dL (ref 3.5–5.0)
Alkaline Phosphatase: 65 U/L (ref 38–126)
Anion gap: 8 (ref 5–15)
BUN: 20 mg/dL (ref 6–20)
CO2: 26 mmol/L (ref 22–32)
Calcium: 9.1 mg/dL (ref 8.9–10.3)
Chloride: 102 mmol/L (ref 98–111)
Creatinine, Ser: 1.01 mg/dL (ref 0.61–1.24)
GFR, Estimated: >60 mL/min (ref >60)
Glucose, Bld: 147 mg/dL — ABNORMAL HIGH (ref 70–99)
Potassium: 4.3 mmol/L (ref 3.5–5.1)
Sodium: 136 mmol/L (ref 135–145)
Total Bilirubin: 0.9 mg/dL (ref 0.3–1.2)
Total Protein: 7 g/dL (ref 6.5–8.1)

## 2020-12-25 LAB — CBC
HCT: 41.1 % (ref 39.0–52.0)
Hemoglobin: 14.3 g/dL (ref 13.0–17.0)
MCH: 30.7 pg (ref 26.0–34.0)
MCHC: 34.8 g/dL (ref 30.0–36.0)
MCV: 88.2 fL (ref 80.0–100.0)
Platelets: 264 10*3/uL (ref 150–400)
RBC: 4.66 MIL/uL (ref 4.22–5.81)
RDW: 12.7 % (ref 11.5–15.5)
WBC: 8.3 10*3/uL (ref 4.0–10.5)
nRBC: 0 % (ref 0.0–0.2)

## 2020-12-25 LAB — DIFFERENTIAL
Abs Immature Granulocytes: 0.04 10*3/uL (ref 0.00–0.07)
Basophils Absolute: 0.1 10*3/uL (ref 0.0–0.1)
Basophils Relative: 1 %
Eosinophils Absolute: 0.1 10*3/uL (ref 0.0–0.5)
Eosinophils Relative: 1 %
Immature Granulocytes: 1 %
Lymphocytes Relative: 28 %
Lymphs Abs: 2.3 10*3/uL (ref 0.7–4.0)
Monocytes Absolute: 0.6 10*3/uL (ref 0.1–1.0)
Monocytes Relative: 7 %
Neutro Abs: 5.2 10*3/uL (ref 1.7–7.7)
Neutrophils Relative %: 62 %

## 2020-12-25 LAB — PROTIME-INR
INR: 1 (ref 0.8–1.2)
Prothrombin Time: 13.3 seconds (ref 11.4–15.2)

## 2020-12-25 LAB — RESP PANEL BY RT-PCR (FLU A&B, COVID) ARPGX2
Influenza A by PCR: NEGATIVE
Influenza B by PCR: NEGATIVE
SARS Coronavirus 2 by RT PCR: NEGATIVE

## 2020-12-25 LAB — CBG MONITORING, ED: Glucose-Capillary: 94 mg/dL (ref 70–99)

## 2020-12-25 LAB — APTT: aPTT: 28 seconds (ref 24–36)

## 2020-12-25 MED ORDER — STROKE: EARLY STAGES OF RECOVERY BOOK: Freq: Once | Status: DC

## 2020-12-25 MED ORDER — VALACYCLOVIR HCL 500 MG PO TABS: 1000.0000 mg | ORAL_TABLET | Freq: Three times a day (TID) | ORAL | Status: DC

## 2020-12-25 MED ORDER — SODIUM CHLORIDE 0.9 % IV SOLN: INTRAVENOUS | Status: DC

## 2020-12-25 MED ORDER — MAGNESIUM HYDROXIDE 400 MG/5ML PO SUSP
30.0000 mL | Freq: Every day | ORAL | Status: DC | PRN
  Filled 2020-12-25: qty 30

## 2020-12-25 MED ORDER — DEXTROSE 5 % IV SOLN
1000.0000 mg | Freq: Three times a day (TID) | INTRAVENOUS | Status: DC
  Administered 2020-12-25 – 2020-12-27 (×6): 1000 mg via INTRAVENOUS
  Filled 2020-12-25 (×9): qty 20

## 2020-12-25 MED ORDER — GABAPENTIN 100 MG PO CAPS
100.0000 mg | ORAL_CAPSULE | Freq: Three times a day (TID) | ORAL | Status: DC
  Administered 2020-12-25 – 2020-12-27 (×5): 100 mg via ORAL
  Filled 2020-12-25 (×5): qty 1

## 2020-12-25 MED ORDER — ONDANSETRON HCL 4 MG/2ML IJ SOLN: 4.0000 mg | Freq: Four times a day (QID) | INTRAMUSCULAR | Status: DC | PRN

## 2020-12-25 MED ORDER — TRAZODONE HCL 50 MG PO TABS
25.0000 mg | ORAL_TABLET | Freq: Every evening | ORAL | Status: DC | PRN
  Administered 2020-12-26: 21:00:00 25 mg via ORAL
  Filled 2020-12-25: qty 1

## 2020-12-25 MED ORDER — TRAMADOL HCL 50 MG PO TABS
50.0000 mg | ORAL_TABLET | Freq: Two times a day (BID) | ORAL | Status: DC | PRN
  Administered 2020-12-26 – 2020-12-27 (×4): 50 mg via ORAL
  Filled 2020-12-25 (×4): qty 1

## 2020-12-25 MED ORDER — LISINOPRIL 20 MG PO TABS
40.0000 mg | ORAL_TABLET | Freq: Every day | ORAL | Status: DC
  Administered 2020-12-26 – 2020-12-27 (×2): 40 mg via ORAL
  Filled 2020-12-25: qty 2
  Filled 2020-12-25: qty 4

## 2020-12-25 MED ORDER — DAPAGLIFLOZIN PROPANEDIOL 5 MG PO TABS
10.0000 mg | ORAL_TABLET | Freq: Every day | ORAL | Status: DC
  Administered 2020-12-27: 13:00:00 10 mg via ORAL
  Filled 2020-12-25 (×4): qty 2

## 2020-12-25 MED ORDER — ONDANSETRON HCL 4 MG PO TABS: 4.0000 mg | ORAL_TABLET | Freq: Four times a day (QID) | ORAL | Status: DC | PRN

## 2020-12-25 MED ORDER — CLOPIDOGREL BISULFATE 75 MG PO TABS
75.0000 mg | ORAL_TABLET | Freq: Every day | ORAL | Status: DC
  Administered 2020-12-26 – 2020-12-27 (×2): 75 mg via ORAL
  Filled 2020-12-25 (×2): qty 1

## 2020-12-25 MED ORDER — VITAMIN D (ERGOCALCIFEROL) 1.25 MG (50000 UNIT) PO CAPS
50000.0000 [IU] | ORAL_CAPSULE | ORAL | Status: DC
  Administered 2020-12-26: 50000 [IU] via ORAL
  Filled 2020-12-25: qty 1

## 2020-12-25 MED ORDER — ASPIRIN EC 325 MG PO TBEC
325.0000 mg | DELAYED_RELEASE_TABLET | Freq: Every day | ORAL | Status: DC
  Administered 2020-12-25 – 2020-12-27 (×3): 325 mg via ORAL
  Filled 2020-12-25 (×3): qty 1

## 2020-12-25 MED ORDER — ACETAMINOPHEN 650 MG RE SUPP: 650.0000 mg | Freq: Four times a day (QID) | RECTAL | Status: DC | PRN

## 2020-12-25 MED ORDER — HYDROCODONE-ACETAMINOPHEN 5-325 MG PO TABS: 1.0000 | ORAL_TABLET | ORAL | Status: DC | PRN

## 2020-12-25 MED ORDER — LIDOCAINE HCL (PF) 1 % IJ SOLN
INTRAMUSCULAR | Status: AC
  Filled 2020-12-25: qty 5

## 2020-12-25 MED ORDER — METOPROLOL SUCCINATE ER 50 MG PO TB24
100.0000 mg | ORAL_TABLET | Freq: Every day | ORAL | Status: DC
  Administered 2020-12-26 – 2020-12-27 (×2): 100 mg via ORAL
  Filled 2020-12-25 (×2): qty 2

## 2020-12-25 MED ORDER — LACTATED RINGERS IV SOLN: INTRAVENOUS | Status: DC

## 2020-12-25 MED ORDER — ROSUVASTATIN CALCIUM 20 MG PO TABS
20.0000 mg | ORAL_TABLET | Freq: Every day | ORAL | Status: DC
  Administered 2020-12-26: 21:00:00 20 mg via ORAL
  Filled 2020-12-25 (×2): qty 1

## 2020-12-25 MED ORDER — ACETAMINOPHEN 325 MG PO TABS
650.0000 mg | ORAL_TABLET | Freq: Four times a day (QID) | ORAL | Status: DC | PRN
  Filled 2020-12-25: qty 2

## 2020-12-25 MED ORDER — IOHEXOL 350 MG/ML SOLN
75.0000 mL | Freq: Once | INTRAVENOUS | Status: AC | PRN
  Administered 2020-12-25: 75 mL via INTRAVENOUS
  Filled 2020-12-25: qty 75

## 2020-12-25 MED ORDER — GADOBUTROL 1 MMOL/ML IV SOLN
10.0000 mL | Freq: Once | INTRAVENOUS | Status: AC | PRN
  Administered 2020-12-25: 10 mL via INTRAVENOUS
  Filled 2020-12-25: qty 10

## 2020-12-25 MED ORDER — DIAZEPAM 5 MG PO TABS
5.0000 mg | ORAL_TABLET | Freq: Once | ORAL | Status: AC
  Administered 2020-12-25: 5 mg via ORAL
  Filled 2020-12-25: qty 1

## 2020-12-25 MED ORDER — ENOXAPARIN SODIUM 40 MG/0.4ML IJ SOSY
40.0000 mg | PREFILLED_SYRINGE | INTRAMUSCULAR | Status: DC
  Administered 2020-12-26: 21:00:00 40 mg via SUBCUTANEOUS
  Filled 2020-12-25: qty 0.4

## 2020-12-25 NOTE — Telephone Encounter (Signed)
Pt called in c/o things not tasting right starting yesterday morning while brushing his teeth.   He noticed the top of his tongue being numb.  Today the numbness is spreading throughout his mouth and his mouth is "contorted".   He was hard for him to swallow his beverage at lunch just now and the numbness is worse today in his mouth.  I immediately instructed him to call 911 that I was concerned his symptoms could suggest a possible stroke.   He was agreeable except instead of calling 911 he has a Art therapist that he said is going to drive him to the ED now.   I emphasized to get there as soon as possible and safely.   He thanked me.

## 2020-12-25 NOTE — ED Provider Notes (Signed)
[facility] Emergency Department Provider Note  ____________________________________________   Event Date/Time   First MD Initiated Contact with Patient 12/25/20 1756     (approximate)  I have reviewed the triage vital signs and the nursing notes.   HISTORY  Chief Complaint Facial Droop   HPI Nathan Hull is a 60 y.o. male with a past medical history of arthritis, CAD, DM, HTN, HDL and obesity as well as recent episode of shingles on the right side of his face having completed a course of valacyclovir and being evaluated by an ophthalmologist without any residual eye issues and improving who presents for assessment of decreased taste that he noticed yesterday and right-sided facial weakness he noticed around 1 PM today.  Patient states that at the peak of his shingles his right eye had been little swollen but this got better last couple days.  He did not have any numbness during the course of the shingles and does not currently have any headache, eye pain ear pain or facial pain.  Denies any fevers, chest pain, cough, shortness of breath, Donnell pain, nausea, vomiting, diarrhea, dysuria, rash otherwise elsewhere or any other acute sick symptoms.  No history of CVA.         Past Medical History:  Diagnosis Date   Arthritis    CAD (coronary artery disease)    Diabetes mellitus without complication ([place])    Difficult intubation 09/24/2013   reports told difficult after CABG at [facility].  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   Atherosclerosis of native coronary artery of native heart with angina pectoris ([place]) 09/20/2013   HLD (hyperlipidemia) 06/19/2006   Benign essential HTN 06/19/2006   Idiopathic peripheral neuropathy 12/30/2005    Past Surgical History:  Procedure Laterality Date   APPLICATION OF WOUND VAC Right 10/22/2013   Procedure: APPLICATION OF WOUND VAC;  Surgeon: [name], MD;  Location: [facility] OR;  Service: Vascular;  Laterality: Right;   C-Spine disc replacement x 2     06/16/2012   CARDIAC CATHETERIZATION     last week @ [facility]   cardiac stents     has been cathed x 4   Had  MI  in 2007   [place]     on right   COLONOSCOPY WITH PROPOFOL N/A 04/26/2016   Procedure: COLONOSCOPY WITH PROPOFOL;  Surgeon: [name], MD;  Location: [place];  Service: Endoscopy;  Laterality: N/A;   CORONARY ARTERY BYPASS GRAFT N/A 09/24/2013   Procedure: CORONARY ARTERY BYPASS GRAFTING (CABG) x three, using LIMA to LAD, and right leg greater saphenous vein harvested endoscopically - SVG to Ramus, SVG to PL;  Surgeon: [name], MD;  Location: [place];  Service: Open Heart Surgery;  Laterality: N/A;   HEMORRHOID SURGERY N/A 05/14/2018   Procedure: HEMORRHOIDECTOMY;  Surgeon: Benjamine Sprague,  DO;  Location: [facility] ORS;  Service: General;  Laterality: N/A;   I & D EXTREMITY Right 10/22/2013   Procedure: IRRIGATION AND DEBRIDEMENT EXTREMITY-RIGHT LEG;  Surgeon: [name], MD;  Location: [place];  Service: Vascular;  Laterality: Right;   INTRAOPERATIVE TRANSESOPHAGEAL ECHOCARDIOGRAM N/A 09/24/2013   Procedure: INTRAOPERATIVE TRANSESOPHAGEAL ECHOCARDIOGRAM;  Surgeon: [name], MD;  Location: [place];  Service: Open Heart Surgery;  Laterality: N/A;   POLYPECTOMY  04/26/2016   Procedure: POLYPECTOMY;  Surgeon: [name], MD;  Location: [place];  Service: Endoscopy;;   PTCA at [facility]      04/28/2005   SPINAL FUSION     vertebra, congentital : Dr. Annette Stable    TONSILLECTOMY      Prior to Admission medications   Medication Sig Start Date End Date Taking? Authorizing Provider  aspirin EC 325 MG EC tablet Take 1 tablet (325 mg total) by mouth daily. 10/26/13   Gold, Patrick Jupiter E, PA-C  Blood Glucose Monitoring Suppl (ONE TOUCH ULTRA MINI) w/Device KIT USE AS DIRECTED 09/16/16   Carmon Ginsberg, PA  cephALEXin (KEFLEX) 500 MG capsule Take by mouth. 12/08/20   [provider]  clopidogrel (PLAVIX) 75 MG tablet Take 1 tablet (75 mg total) by mouth daily. 09/01/20   Birdie Sons, MD  FARXIGA 10 MG TABS tablet TAKE 1 TABLET BY MOUTH DAILY BEFORE BREAKFAST. 11/29/20   Birdie Sons, MD  gabapentin (NEURONTIN) 100 MG capsule Take 1 capsule (100 mg total) by mouth 3 (three) times daily. 12/12/20   Jerrol Banana., MD  HYDROcodone-acetaminophen [facility]) 5-325 MG tablet Take 1 tablet by mouth every 4 (four) hours as needed for moderate pain. 12/12/20   Jerrol Banana., MD  ibuprofen (ADVIL,MOTRIN) 800 MG tablet Take 1 tablet (800 mg total) by mouth every 8 (eight) hours as needed for mild pain or moderate pain. 05/14/18   Lysle Pearl, Isami, DO  lisinopril (ZESTRIL) 40 MG tablet Take 1 tablet (40 mg total) by mouth daily. 10/10/20   Jerrol Banana., MD  metoprolol succinate (TOPROL-XL) 100 MG 24 hr tablet Take 1 tablet (100 mg total) by mouth daily. 10/26/20   Jerrol Banana., MD  ONE [facility] ULTRA TEST test strip USE AS DIRECTED 10/28/16   Carmon Ginsberg, PA  [employer] DELICA LANCETS FINE MISC See admin instructions. 08/02/16   [provider]  rosuvastatin (CRESTOR) 20 MG tablet TAKE 1 TABLET BY MOUTH EVERY DAY 03/08/20   Trinna Post, PA-C  traMADol (ULTRAM) 50 MG tablet TAKE 1 TABLET BY MOUTH TWICE A DAY AS NEEDED 12/08/20   Jerrol Banana., MD  valACYclovir (VALTREX) 1000 MG tablet Take 1,000 mg by mouth 3 (three) times daily. 12/11/20   [provider]   Vitamin D, Ergocalciferol, (DRISDOL) 1.25 MG (50000 UNIT) CAPS capsule Take 1 capsule (50,000 Units total) by mouth every 7 (seven) days. 07/06/20   Just, Laurita Quint, FNP    Allergies Metformin and related  Family History  Problem Relation Age of Onset   Hypertension Mother    Rheumatic fever Mother        S/P MVR   Hypertension Father     Social History Social History   Tobacco Use   Smoking status: Former    Packs/day: 2.00    Types: Cigarettes    Quit date: 04/23/2005    Years since quitting: 15.6   Smokeless tobacco: Never  Vaping Use   Vaping Use: Never used  Substance Use Topics   Alcohol use: Yes    Alcohol/week: 6.0 standard drinks    Types: 6 Cans of beer per week    Comment: half a 6 pack a month   Drug use: No    Review of Systems  Review of Systems  Constitutional:  Negative for chills and fever.  HENT:  Negative for sore throat.   Eyes:  Negative for pain.  Respiratory:  Negative for cough and stridor.   Cardiovascular:  Negative for chest pain.  Gastrointestinal:  Negative for vomiting.  Genitourinary:  Negative for dysuria.  Musculoskeletal:  Negative for myalgias.  Skin:  Negative for rash.  Neurological:  Positive for focal weakness (R face). Negative for seizures, loss of consciousness and headaches.  Psychiatric/Behavioral:  Negative for suicidal ideas.   All other systems reviewed and are negative.    ____________________________________________   PHYSICAL EXAM:  VITAL SIGNS: ED Triage Vitals  Enc Vitals Group     BP 12/25/20 1438 (!) 157/93     Pulse Rate 12/25/20 1438 65     Resp 12/25/20 1438 18     Temp 12/25/20 1438 98.3 F (36.8 C)     Temp Source 12/25/20 1438 Oral     SpO2 12/25/20 1438 96 %     Weight 12/25/20 1438 290 lb (131.5 kg)     Height 12/25/20 1438 6' (1.829 m)     Head Circumference --      Peak Flow --      Pain Score 12/25/20 1514 0     Pain Loc --      Pain Edu? --      Excl. in [place]? --    Vitals:    12/25/20 1830 12/25/20 1930  BP: (!) 157/85 (!) 176/97  Pulse: 64 63  Resp: 17 15  Temp:    SpO2: 100% 99%   Physical Exam Vitals and nursing note reviewed.  Constitutional:      Appearance: He is well-developed.  HENT:     Head: Normocephalic and atraumatic.     Right Ear: External ear normal.     Left Ear: External ear normal.     Nose: Nose normal.     Mouth/Throat:     Mouth: Mucous membranes are moist.  Eyes:     Conjunctiva/sclera: Conjunctivae normal.  Cardiovascular:     Rate and Rhythm: Normal rate and regular rhythm.     Heart sounds: No murmur heard. Pulmonary:     Effort: Pulmonary effort is normal. No respiratory distress.     Breath sounds: Normal breath sounds.  Abdominal:     Palpations: Abdomen is soft.     Tenderness: There is no abdominal tenderness.  Musculoskeletal:     Cervical back: Neck supple.  Skin:    General: Skin is warm and dry.  Neurological:     Mental Status: He is alert and oriented to person, place, and time.  Psychiatric:        Mood and Affect: Mood normal.    There is no pronator drift.  No finger dysmetria.  Patient has symmetric strength in his bilateral upper and lower extremities.  Sensation is intact to light touch.  Patient appears to have some weakness in the distribution of cranial nerve V in V2 and V3 with sparing of his forehead.  Cranial nerves II through XII are otherwise grossly intact.  There are no vesicles visible on the forehead face or in the external ear canal.  PERRLA.  EOMI.  Oropharynx is unremarkable.  No significant periorbital edema. ____________________________________________   LABS (all labs ordered are listed, but only abnormal results are displayed)  Labs Reviewed  COMPREHENSIVE METABOLIC PANEL - Abnormal; Notable for the following components:      Result Value   Glucose, Bld 147 (*)    All other components within normal limits  RESP PANEL BY RT-PCR (FLU A&B, COVID) ARPGX2  CSF CULTURE W GRAM STAIN   HSV 1/2 AB IGG/IGM CSF  VZV PCR, CSF  PROTIME-INR  APTT  CBC  DIFFERENTIAL  URINE DRUG SCREEN, QUALITATIVE ([facility] ONLY)  URINALYSIS, ROUTINE W REFLEX MICROSCOPIC  CSF CELL COUNT WITH DIFFERENTIAL  CSF CELL COUNT WITH DIFFERENTIAL  PROTEIN AND GLUCOSE, CSF  VARICELLA ZOSTER ANTIBODY, IGG  MISC [facility] TEST (SEND OUT)  BASIC METABOLIC PANEL  CBG MONITORING, ED  I-STAT CREATININE, ED   ____________________________________________  EKG  Sinus rhythm with a ventricle rate of 64, normal axis, right bundle branch block, otherwise unremarkable intervals without clear evidence of acute ischemia there is an isolated nonspecific change in lead III.  This changes present although not quite as pronounced on ECG obtained on 06/05/22. ____________________________________________  RADIOLOGY  ED MD interpretation: CT without contrast shows no evidence of intracranial hemorrhage, mass-effect or other acute intracranial process.  Official radiology report(s): CT HEAD WO CONTRAST (5MM)  Result Date: 12/25/2020 CLINICAL DATA:  Pt reports that yesterday he got up and brushed his teeth states that it felt funny did not think anything of it. He then was eating lunch and his drink dripped down his face. He has a history of Bells Palsy EXAM: CT HEAD WITHOUT CONTRAST TECHNIQUE: Contiguous axial images were obtained from the base of the skull through the vertex without intravenous contrast. COMPARISON:  CT head 02/27/2004 FINDINGS: Brain: No evidence of large-territorial acute infarction. No parenchymal hemorrhage. No mass lesion. No extra-axial collection. No mass effect or midline shift. No hydrocephalus. Basilar cisterns are patent. Vascular: No hyperdense vessel. Skull: No acute fracture or focal lesion. Sinuses/Orbits: Paranasal sinuses and mastoid air cells are clear. The orbits are unremarkable. Other: None. IMPRESSION: No acute intracranial abnormality. Electronically Signed   By: Iven Finn M.D.   On:  12/25/2020 18:00    ____________________________________________   PROCEDURES  Procedure(s) performed (including Critical Care):  .Lumbar Puncture  Date/Time: 12/25/2020 7:47 PM Performed by: Lucrezia Starch, MD Authorized by: Lucrezia Starch, MD   Consent:    Consent obtained:  Verbal   Consent given by:  Patient   Risks discussed:  Bleeding, headache, infection and nerve damage   Alternatives discussed:  No treatment Universal protocol:    Procedure explained and questions answered to patient or proxy's satisfaction: yes     Patient identity confirmed:  Verbally with patient Pre-procedure details:    Procedure purpose:  Diagnostic   Preparation: Patient was prepped and draped in usual sterile fashion   Anesthesia:    Anesthesia method:  Local infiltration   Local anesthetic:  Lidocaine 1% w/o epi Procedure details:    Lumbar space:  L3-L4 interspace   Patient position:  Sitting   Needle gauge:  18   Ultrasound guidance: no     Number of attempts:  3 Post-procedure details:    Puncture site:  Direct pressure applied   Procedure completion:  Tolerated   ____________________________________________   INITIAL IMPRESSION / ASSESSMENT AND PLAN / ED COURSE        Patient presents with above-stated history exam for assessment  of cute onset of right-sided facial weakness described above in the setting of losing taste yesterday and recently getting over shingles infection on the right side of the face.  On arrival he is hypertensive with otherwise stable vital signs on room air.  He does have right-sided facial weakness and a little bit of left-sided tongue deviation.  He appears to have sparing of his forehead.  Differential includes CVA, Ramsay Hunt without cutaneous manifestations i.e. possibly involving only cranial nerves or some other process affecting his nerves at this time.  No active lesions to suggest cutaneous HSV infection.  He has no eye pain and states he is  already checked by ophthalmologist and I have a low suspicion for ocular HSV at this time.  Given sudden onset of right-sided facial weakness with sparing of the forehead seen on exam within 6 hours patient was made a code stroke.  CT head without contrast was unremarkable.  CMP shows no significant electrode or metabolic derangements.  CBC shows no leukocytosis or acute anemia.  INR is WNL.  Discussed case with on-call telemetry neurologist who did see patient over video Dr Curly Shores there was some concern for HSV as etiology and recommended in addition to obtaining vessel imaging and MRI and LP.  LP was attempted per procedure note above but was able to obtain CSF.  This was deferred to IR for repeat attempt in the meantime patient will be started on IV acyclovir.  He will be admitted to hospital service for further evaluation and management.     ____________________________________________   FINAL CLINICAL IMPRESSION(S) / ED DIAGNOSES  Final diagnoses:  Facial weakness    Medications  acyclovir (ZOVIRAX) 1,000 mg in dextrose 5 % 150 mL IVPB (has no administration in time range)  lactated ringers infusion (has no administration in time range)  diazepam (VALIUM) tablet 5 mg (5 mg Oral Given 12/25/20 1922)  lidocaine (PF) (XYLOCAINE) 1 % injection (  Given 12/25/20 1932)     ED Discharge Orders     None        Note:  This document was prepared using Dragon voice recognition software and may include unintentional dictation errors.    Lucrezia Starch, MD 12/25/20 [id number]

## 2020-12-25 NOTE — Telephone Encounter (Addendum)
Patient called to triage as noted below, no answer, no mailbox set up. Triaged in another encounter on today, see nurse triage encounter.  Minette Headland, CMA 1 hour ago (3:21 PM)   Attempted to reach patient but voicemailbox is not set up yet, if patient returns call please have him triaged by [facility] nurse for possible reaction to vaccine. KW     Copied from [place] [phone]. Topic: General - Other >> Dec 25, 2020  1:29 PM Pawlus, Brayton Layman A wrote: Reason for CRM: Pt had some follow questions after his office visit for Shingles, pt stated his tongue is now feeling strange, please advise if the pt needs to schedule another appt.

## 2020-12-25 NOTE — Telephone Encounter (Signed)
Copied from [place] [phone]. Topic: General - Other >> Dec 25, 2020  1:29 PM Pawlus, Brayton Layman A wrote: Reason for CRM: Pt had some follow questions after his office visit for Shingles, pt stated his tongue is now feeling strange, please advise if the pt needs to schedule another appt.

## 2020-12-25 NOTE — Consult Note (Signed)
[facility] Telemedicine Consult  Requesting Provider: Hulan Saas Consult Participants: Myself, Dr. Tamala Julian, bedside nursing, patient, [facility] nurse Location of the provider: [place], [place] Location of the patient: [facility]   This consult was provided via telemedicine with 2-way video and audio communication. The patient/family was informed that care would be provided in this way and agreed to receive care in this manner.    Chief Complaint:   HPI: 60 y.o w/ multiple stroke risk fx (CAD, DM, HTN, HLD, obesity), as well as remote Bell's (right face, ~25 yrs ago) and family hx of Bell's palsy in his sister. Recently dx w/ shingles of the right face and eyelid, seen 9/12 and given gabapentin 100 mg TID (still taking) and keflex 500 mg tabs x 10 day course as well as valcyclovir 1000 mg TID x 10 days course (antibiotic and antiviral completed). He subsequently noticed change in toothpaste taste 9/25 at 8 AM, progressive, then with right face weakness noted today at 1 PM including difficulty swallowing. Denies HA, vision changes, hearing changes, speech changes or other symptoms at this time.    Past Medical History:  Diagnosis Date   Arthritis    CAD (coronary artery disease)    Diabetes mellitus without complication ([place])    Difficult intubation 09/24/2013   reports told difficult after CABG at [facility].  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 No, exam not c/w LVO and NIHSS to low per protocol  Modified Rankin Scale: 0-Completely asymptomatic and back to baseline post- stroke Time of teleneurologist evaluation: 6:18 PM  Exam: Vitals:   12/25/20 1438 12/25/20 1830  BP: (!) 157/93 (!)  157/85  Pulse: 65 64  Resp: 18 17  Temp: 98.3 F (36.8 C)   SpO2: 96% 100%    General: Comfortable, no acute distress, healed scab on right eyelid Pulmonary: breathing comfortably Cardiac: regular rate and rhythm on monitor   NIH Stroke scale 1A: Level of Consciousness - 0 1B: Ask Month and Age - 0 1C: 'Blink Eyes' & 'Squeeze Hands' - 0 2: Test Horizontal Extraocular Movements - 0 3: Test Visual Fields - 0 4: Test Facial Palsy - 2 right face, with weak eye closure (incomplete lash burying) as well and possibly slightly reduced eyebrow movement 5A: Test Left Arm Motor Drift - 0 5B: Test Right Arm Motor Drift - 0 6A: Test Left Leg Motor Drift - 0 6B: Test Right Leg Motor Drift - 0 7: Test Limb Ataxia - 0 8: Test Sensation - 0 9: Test Language/Aphasia- 0 10: Test Dysarthria - 0 11: Test Extinction/Inattention - 0 NIHSS score: 0   Imaging Reviewed:   Head CT personally reviewed, negative for acute process   Labs reviewed in epic and pertinent values follow:  Basic Metabolic Panel: Recent Labs  Lab 12/25/20 1519  NA 136  K 4.3  CL 102  CO2 26  GLUCOSE 147*  BUN 20  CREATININE 1.01  CALCIUM 9.1    CBC: Recent Labs  Lab 12/25/20 1519  WBC 8.3  NEUTROABS 5.2  HGB 14.3  HCT 41.1  MCV 88.2  PLT 264  Coagulation Studies: Recent Labs    12/25/20 1519  LABPROT 13.3  INR 1.0      Assessment: 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 Given stroke risk factors will additionally obtain MRI brain w/ and w/o to look for stroke vs. CN VII enhancement. Standard stroke workup only if MRI brain positive for stroke (but would still need LP in this case so please hold off on any plavix)   Recommendations:  -CTA head and neck to evaluate for potential VZV vasculopathy  -MRI brain w/ and w/o to evaluate for  stroke vs. Bell's  -Lumbar puncture for basic studies (cell counts 1 & 4, protein, glucose, bacterial culture, as well as HSV1/2, VZV PCR, VZV IgG [misc lab send out to [facility]])  -Empiric herpes meningitis/encephalitis IV acyclovir TID ordered, per pharmacy consult to monitor renal function closely and dose IVF appropriately, confirmed with Dr. Tamala Julian that patient is a good candidate for full dose IVF on his bedside exam -Discussed plan with Dr. Tamala Julian via telephone -I will follow up with the patient tomorrow in person   This patient is receiving care for possible acute neurological changes. There was 82 minutes of care by this provider at the time of service, including time for direct evaluation via telemedicine, review of medical records, imaging studies and discussion of findings with providers, the patient and/or family.  Lesleigh Noe MD-PhD [facility] [phone]   If 8pm-8am, please page neurology on call as listed in [place].

## 2020-12-25 NOTE — ED Notes (Signed)
Primary RN and ED provider Tamala Julian, MD at bedside. Time Out performed. LP procedure started. See pre vitals.

## 2020-12-25 NOTE — H&P (Signed)
[place]   PATIENT NAME: Nathan Hull    MR#:  [id number]  DATE OF BIRTH:  Jun 15, 1960  DATE OF ADMISSION:  12/25/2020  PRIMARY CARE PHYSICIAN: Gwyneth Sprout, FNP   Patient is coming from: Home  REQUESTING/REFERRING PHYSICIAN: Hulan Saas, MD  CHIEF COMPLAINT:   Chief Complaint  Patient presents with   Facial Droop    HISTORY OF PRESENT ILLNESS:  Nathan Hull is a 60 y.o. Caucasian male with medical history significant for multiple medical problems including coronary artery disease, hypertension, dyslipidemia, type 2 diabetes mellitus, who presented to the emergency room with acute onset of right-sided facial weakness.  The patient had herpes zoster on 9/15 that started with a stye on his upper eyelid.  He then was given antibiotic on the same day.  Later on he started having herpetic eruption on his forehead and saw his primary care physician.  He was then placed on Valtrex for 10 days as well as Neurontin.  On Sunday morning he was brushing his teeth and had a funny taste in his mouth and later on at lunch she felt numbness on the top of his tongue.  He felt he was not able to swallow.  He continues to have right burning sensation around his eye globe.  He was seen by an ophthalmologist and stated that his vision was perfectly fine.  He denies any other paresthesias or focal muscle weakness.  He was noted to have more right lower facial weakness.  He was seen in a code stroke by Dr. Curly Shores who recommended stroke work-up including head and neck CTA as well as lumbar puncture.  This could not be performed by the ER physician and therefore IR consult was placed. The patient denied any chest pain or dyspnea or cough or wheezing.  No fever or chills.  No dysuria, oliguria or hematuria or flank pain.  No tinnitus or vertigo.   ED Course: When he came to the ER blood pressure was 157/85 with otherwise normal vital signs.  Labs revealed blood glucose of 147 with otherwise  unremarkable CMP.  CBC was within normal.  Influenza antigens and COVID-19 PCR came back negative.  EKG as reviewed by me : EKG showed normal sinus rhythm with a rate of 64 with T wave inversion in V1 and right bundle branch block. Imaging: Noncontrasted head CT revealed no acute intracranial normalities. CT of the head and neck revealed no large vessel occlusion or high-grade stenosis of the head or neck.  There was mild bilateral carotid bifurcation atherosclerosis without hemodynamically significant stenosis.  It showed aortic atherosclerosis.  The patient was given 1 g of IV acyclovir, 5 mg p.o. Valium and hydration with IV lactated Ringer at 125 mill per hour.  He will be admitted to an observation medical monitored bed for further evaluation and management. PAST MEDICAL HISTORY:   Past Medical History:  Diagnosis Date   Arthritis    CAD (coronary artery disease)    Diabetes mellitus without complication ([place])    Difficult intubation 09/24/2013   reports told difficult after CABG at [facility].  Glidescope used   Dyspnea    walking a mile   Family history of anesthesia complication    mother had N/V after valve replacement   History of kidney stones    Hyperlipidemia    Hypertension    Myocardial infarct [facility]) 2007   Obesity    Wears contact lenses     PAST SURGICAL HISTORY:  Past Surgical History:  Procedure Laterality Date   APPLICATION OF WOUND VAC Right 10/22/2013   Procedure: APPLICATION OF WOUND VAC;  Surgeon: [name], MD;  [facility] OR;  Service: Vascular;  Laterality: Right;   C-Spine disc replacement x 2     06/16/2012   CARDIAC CATHETERIZATION     last week @ [facility]   cardiac stents     has been cathed x 4   Had  MI  in 2007   [place]     on right   COLONOSCOPY WITH PROPOFOL N/A 04/26/2016   Procedure: COLONOSCOPY WITH PROPOFOL;  Surgeon: [name], MD;  Location: [place];  Service: Endoscopy;  Laterality: N/A;   CORONARY ARTERY  BYPASS GRAFT N/A 09/24/2013   Procedure: CORONARY ARTERY BYPASS GRAFTING (CABG) x three, using LIMA to LAD, and right leg greater saphenous vein harvested endoscopically - SVG to Ramus, SVG to PL;  Surgeon: [name], MD;  Location: [place];  Service: Open Heart Surgery;  Laterality: N/A;   HEMORRHOID SURGERY N/A 05/14/2018   Procedure: HEMORRHOIDECTOMY;  Surgeon: Benjamine Sprague, DO;  Location: [facility] ORS;  Service: General;  Laterality: N/A;   I & D EXTREMITY Right 10/22/2013   Procedure: IRRIGATION AND DEBRIDEMENT EXTREMITY-RIGHT LEG;  Surgeon: [name], MD;  Location: [place];  Service: Vascular;  Laterality: Right;   INTRAOPERATIVE TRANSESOPHAGEAL ECHOCARDIOGRAM N/A 09/24/2013   Procedure: INTRAOPERATIVE TRANSESOPHAGEAL ECHOCARDIOGRAM;  Surgeon: [name], MD;  Location: [place];  Service: Open Heart Surgery;  Laterality: N/A;   POLYPECTOMY  04/26/2016   Procedure: POLYPECTOMY;  Surgeon: [name], MD;  Location: [place];  Service: Endoscopy;;   PTCA at [facility]     04/28/2005   SPINAL FUSION     vertebra, congentital : Dr. Annette Stable    TONSILLECTOMY      SOCIAL HISTORY:   Social History   Tobacco Use   Smoking status: Former    Packs/day: 2.00    Types: Cigarettes    Quit date: 04/23/2005    Years since quitting: 15.6   Smokeless tobacco: Never  Substance Use Topics   Alcohol use: Yes    Alcohol/week: 6.0 standard drinks    Types: 6 Cans of beer per week    Comment: half a 6 pack a month    FAMILY HISTORY:   Family History  Problem Relation Age of Onset   Hypertension Mother    Rheumatic fever Mother        S/P MVR   Hypertension Father     DRUG ALLERGIES:   Allergies  Allergen Reactions   Metformin And Related Other (See Comments)    Abdominal pain and diarrhea    REVIEW OF SYSTEMS:   ROS As per history of present illness. All pertinent systems were reviewed above. Constitutional, HEENT, cardiovascular, respiratory, GI, GU, musculoskeletal,  neuro, psychiatric, endocrine, integumentary and hematologic systems were reviewed and are otherwise negative/unremarkable except for positive findings mentioned above in the HPI.   MEDICATIONS AT HOME:   Prior to Admission medications   Medication Sig Start Date End Date Taking? Authorizing Provider  aspirin EC 325 MG EC tablet Take 1 tablet (325 mg total) by mouth daily. 10/26/13   Gold, Patrick Jupiter E, PA-C  Blood Glucose Monitoring Suppl (ONE TOUCH ULTRA MINI) w/Device KIT USE AS DIRECTED 09/16/16   Carmon Ginsberg, PA  cephALEXin (KEFLEX) 500 MG capsule Take by mouth. 12/08/20   [provider]  clopidogrel (PLAVIX) 75  MG tablet Take 1 tablet (75 mg total) by mouth daily. 09/01/20   Birdie Sons, MD  FARXIGA 10 MG TABS tablet TAKE 1 TABLET BY MOUTH DAILY BEFORE BREAKFAST. 11/29/20   Birdie Sons, MD  gabapentin (NEURONTIN) 100 MG capsule Take 1 capsule (100 mg total) by mouth 3 (three) times daily. 12/12/20   Jerrol Banana., MD  HYDROcodone-acetaminophen [facility]) 5-325 MG tablet Take 1 tablet by mouth every 4 (four) hours as needed for moderate pain. 12/12/20   Jerrol Banana., MD  ibuprofen (ADVIL,MOTRIN) 800 MG tablet Take 1 tablet (800 mg total) by mouth every 8 (eight) hours as needed for mild pain or moderate pain. 05/14/18   Lysle Pearl, Isami, DO  lisinopril (ZESTRIL) 40 MG tablet Take 1 tablet (40 mg total) by mouth daily. 10/10/20   Jerrol Banana., MD  metoprolol succinate (TOPROL-XL) 100 MG 24 hr tablet Take 1 tablet (100 mg total) by mouth daily. 10/26/20   Jerrol Banana., MD  ONE [facility] ULTRA TEST test strip USE AS DIRECTED 10/28/16   Carmon Ginsberg, PA  [facility] DELICA LANCETS FINE MISC See admin instructions. 08/02/16   [provider]  rosuvastatin (CRESTOR) 20 MG tablet TAKE 1 TABLET BY MOUTH EVERY DAY 03/08/20   Trinna Post, PA-C  traMADol (ULTRAM) 50 MG tablet TAKE 1 TABLET BY MOUTH TWICE A DAY AS NEEDED 12/08/20   Jerrol Banana., MD   valACYclovir (VALTREX) 1000 MG tablet Take 1,000 mg by mouth 3 (three) times daily. 12/11/20   [provider]  Vitamin D, Ergocalciferol, (DRISDOL) 1.25 MG (50000 UNIT) CAPS capsule Take 1 capsule (50,000 Units total) by mouth every 7 (seven) days. 07/06/20   Just, Laurita Quint, FNP      VITAL SIGNS:  Blood pressure (!) 144/90, pulse 67, temperature 98.3 F (36.8 C), temperature source Oral, resp. rate 16, height 6' (1.829 m), weight 131.5 kg, SpO2 100 %.  PHYSICAL EXAMINATION:  Physical Exam  GENERAL:  59 y.o.-year-old Caucasian male patient lying in the bed with no acute distress.  EYES: Pupils equal, round, reactive to light and accommodation. No scleral icterus. Extraocular muscles intact.  HEENT: Head atraumatic, normocephalic.  Scabby papular eruption on his right eyelashes oropharynx and nasopharynx clear.  NECK:  Supple, no jugular venous distention. No thyroid enlargement, no tenderness.  LUNGS: Normal breath sounds bilaterally, no wheezing, rales,rhonchi or crepitation. No use of accessory muscles of respiration.  CARDIOVASCULAR: Regular rate and rhythm, S1, S2 normal. No murmurs, rubs, or gallops.  ABDOMEN: Soft, nondistended, nontender. Bowel sounds present. No organomegaly or mass.  EXTREMITIES: No pedal edema, cyanosis, or clubbing.  NEUROLOGIC: Cranial nerves II through XII are intact except for right facial weakness more on the lower side with right facial droop.  Closing eyes against resistance was fairly equal on both sides.. Muscle strength 5/5 in all extremities. Sensation intact. Gait not checked.  PSYCHIATRIC: The patient is alert and oriented x 3.  Normal affect and good eye contact. SKIN: No obvious rash, lesion, or ulcer.   LABORATORY PANEL:   CBC Recent Labs  Lab 12/25/20 1519  WBC 8.3  HGB 14.3  HCT 41.1  PLT 264   ------------------------------------------------------------------------------------------------------------------  Chemistries   Recent Labs  Lab 12/25/20 1519  NA 136  K 4.3  CL 102  CO2 26  GLUCOSE 147*  BUN 20  CREATININE 1.01  CALCIUM 9.1  AST 26  ALT 24  ALKPHOS 65  BILITOT 0.9   ------------------------------------------------------------------------------------------------------------------  Cardiac Enzymes No results for input(s): TROPONINI in the last 168 hours. ------------------------------------------------------------------------------------------------------------------  RADIOLOGY:  CT ANGIO HEAD NECK W WO CM  Result Date: 12/25/2020 CLINICAL DATA:  Facial droop.  History of Bell's palsy. EXAM: CT ANGIOGRAPHY HEAD AND NECK TECHNIQUE: Multidetector CT imaging of the head and neck was performed using the standard protocol during bolus administration of intravenous contrast. Multiplanar CT image reconstructions and MIPs were obtained to evaluate the vascular anatomy. Carotid stenosis measurements (when applicable) are obtained utilizing NASCET criteria, using the distal internal carotid diameter as the denominator. CONTRAST:  82m OMNIPAQUE IOHEXOL 350 MG/ML SOLN COMPARISON:  Head CT 12/25/2020 FINDINGS: CTA NECK FINDINGS SKELETON: There is no bony spinal canal stenosis. No lytic or blastic lesion. C5-7 ACDF. OTHER NECK: Normal pharynx, larynx and major salivary glands. No cervical lymphadenopathy. Unremarkable thyroid gland. UPPER CHEST: No pneumothorax or pleural effusion. No nodules or masses. AORTIC ARCH: There is calcific atherosclerosis of the aortic arch. There is no aneurysm, dissection or hemodynamically significant stenosis of the visualized portion of the aorta. Conventional 3 vessel aortic branching pattern. The visualized proximal subclavian arteries are widely patent. RIGHT CAROTID SYSTEM: No dissection, occlusion or aneurysm. Mild atherosclerotic calcification at the carotid bifurcation without hemodynamically significant stenosis. LEFT CAROTID SYSTEM: No dissection, occlusion or aneurysm.  Mild atherosclerotic calcification at the carotid bifurcation without hemodynamically significant stenosis. VERTEBRAL ARTERIES: Left dominant configuration. Both origins are clearly patent. There is no dissection, occlusion or flow-limiting stenosis to the skull base (V1-V3 segments). CTA HEAD FINDINGS POSTERIOR CIRCULATION: --Vertebral arteries: Normal left V4 segment. Right V4 segment appears to terminate in PICA. --Inferior cerebellar arteries: Normal. --Basilar artery: Normal. --Superior cerebellar arteries: Normal. --Posterior cerebral arteries (PCA): Normal. ANTERIOR CIRCULATION: --Intracranial internal carotid arteries: Normal. --Anterior cerebral arteries (ACA): Normal. Both A1 segments are present. Patent anterior communicating artery (a-comm). --Middle cerebral arteries (MCA): Normal. VENOUS SINUSES: As permitted by contrast timing, patent. ANATOMIC VARIANTS: None Review of the MIP images confirms the above findings. IMPRESSION: 1. No emergent large vessel occlusion or high-grade stenosis of the head or neck. 2. Mild bilateral carotid bifurcation atherosclerosis without hemodynamically significant stenosis by NASCET criteria. Aortic Atherosclerosis (ICD10-I70.0). Electronically Signed   By: KUlyses JarredM.D.   On: 12/25/2020 21:50   CT HEAD WO CONTRAST (5MM)  Result Date: 12/25/2020 CLINICAL DATA:  Pt reports that yesterday he got up and brushed his teeth states that it felt funny did not think anything of it. He then was eating lunch and his drink dripped down his face. He has a history of Bells Palsy EXAM: CT HEAD WITHOUT CONTRAST TECHNIQUE: Contiguous axial images were obtained from the base of the skull through the vertex without intravenous contrast. COMPARISON:  CT head 02/27/2004 FINDINGS: Brain: No evidence of large-territorial acute infarction. No parenchymal hemorrhage. No mass lesion. No extra-axial collection. No mass effect or midline shift. No hydrocephalus. Basilar cisterns are patent.  Vascular: No hyperdense vessel. Skull: No acute fracture or focal lesion. Sinuses/Orbits: Paranasal sinuses and mastoid air cells are clear. The orbits are unremarkable. Other: None. IMPRESSION: No acute intracranial abnormality. Electronically Signed   By: MIven FinnM.D.   On: 12/25/2020 18:00   MR BRAIN W WO CONTRAST  Result Date: 12/25/2020 CLINICAL DATA:  Neuro deficit, acute, stroke suspected Bell's palsy vs. Stroke, thin cuts through brainstem to eval for CN VII enhancement and potential XI/X involvement EXAM: MRI HEAD WITHOUT AND WITH CONTRAST TECHNIQUE: Multiplanar, multiecho pulse sequences of the brain and surrounding structures were obtained without  and with intravenous contrast. CONTRAST:  58m GADAVIST GADOBUTROL 1 MMOL/ML IV SOLN COMPARISON:  Brain MRI 02/28/2004 FINDINGS: Brain: No acute infarct, mass effect or extra-axial collection. No acute or chronic hemorrhage. There is multifocal hyperintense T2-weighted signal within the white matter. Parenchymal volume and CSF spaces are normal. The midline structures are normal. INTERNAL AUDITORY CANALS: There is no cerebellopontine angle mass. The cochleae and semicircular canals are normal. No focal abnormality along the course of the 7th and 8th cranial nerves. There is normal, symmetric contrast enhancement of the intratemporal segments of the facial nerves. Normal porus acusticus and vestibular aqueduct bilaterally. Vascular: Major flow voids are preserved. Skull and upper cervical spine: Normal calvarium and skull base. Visualized upper cervical spine and soft tissues are normal. Sinuses/Orbits:No paranasal sinus fluid levels or advanced mucosal thickening. No mastoid or middle ear effusion. Normal orbits. IMPRESSION: 1. Normal MRI of the internal auditory canals and inner ear structures. 2. No acute intracranial abnormality. 3. Findings of mild chronic microvascular ischemia. Electronically Signed   By: KUlyses JarredM.D.   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  Right-sided facial weakness more pronounced on the right lower face.  Differential diagnosis would include Bell's palsy associated with facial/trigeminal acute herpes zoster. - The patient will be admitted to an observation medical monitored bed. - We will follow neurochecks every 4 hours for 24 hours. - We will continue him on aspirin and Plavix. - Continue statin therapy and obtain fasting lipids. - He had an unremarkable head and neck CTA. - We will obtain a 2D echo with bubble study. - Neurology follow-up consultation will be obtained. - I notified Dr. SQuinn Axeabout the patient. - We will check brain MRI results. - We will continue him on IV acyclovir.  2.  Type II diabetes mellitus. - We will continue his FWilder Gladeand placed on supplemental coverage with NovoLog.  3.  Dyslipidemia. - We will continue statin therapy.  4.  Essential hypertension. - We will continue his Zestril with permissive parameters.  5.  Vitamin D deficiency. - We will continue his vitamin D.   DVT prophylaxis: Lovenox. Code Status: full code. Family Communication:  The plan of care was discussed in details with the patient (and family). I answered all questions. The patient agreed to proceed with the above mentioned plan. Further management will depend upon hospital course. Disposition Plan: Back to previous home environment Consults called: Neurology. All the records are reviewed and case discussed with ED provider.  Status is: Observation  The patient remains OBS appropriate and will d/c before 2 midnights.  Dispo: The patient is from: Home              Anticipated d/c is to: Home              Patient currently is not medically stable to d/c.   Difficult to place patient No      TOTAL TIME TAKING CARE OF THIS PATIENT: 55 minutes.    JChristel MormonM.D on 12/25/2020 at 10:48  PM  [facility]   From 7 PM-7 AM, contact night-coverage [email or web address]  CC: Primary care physician; PGwyneth Sprout FNP

## 2020-12-25 NOTE — ED Notes (Signed)
Tele neuro RN assessing pt.

## 2020-12-25 NOTE — ED Notes (Signed)
CODE  STROKE  CALLED  TO  [facility]  PER  DR  Hulan Saas  MD

## 2020-12-25 NOTE — Telephone Encounter (Signed)
Attempted to reach patient but voicemailbox is not set up yet, if patient returns call please have him triaged by [facility] nurse for possible reaction to vaccine. KW

## 2020-12-25 NOTE — Consult Note (Signed)
Pharmacy Antibiotic Note  Nathan Hull is a 60 y.o. male w/ h/o CAD, DM, HLD, HTN, MI, BMI 39 with recent episode of shingles involving rt-side of face that was treated outpatient with valacyclovir.  After initial improvement pt noticed dec'd sense of taste, tongue-numbness, & right-sided facial weakness started @1300  9/26.   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  Pharmacy has been consulted for empiric acyclovir dosing.  Plan: Acyclovir dosing 10mg /kg q8h  Dosing per AdjBW d/t BMI >30 CrCl >>54ml/min Concentration <7mg /ml Started LR @125ml /hr for MIVF / renal protection  Height: 6' (182.9 cm) Weight: 131.5 kg (290 lb) IBW/kg (Calculated) : 77.6  Temp (24hrs), Avg:98.3 F (36.8 C), Min:98.3 F (36.8 C), Max:98.3 F (36.8 C)  Recent Labs  Lab 12/25/20 1519  WBC 8.3  CREATININE 1.01    Estimated Creatinine Clearance: 111.9 mL/min (by C-G formula based on SCr of 1.01 mg/dL).    Allergies  Allergen Reactions   Metformin And Related Other (See Comments)    Abdominal pain and diarrhea    Antimicrobials this admission: acyclovir (9/26 >>   Dose adjustments this admission: Will CTM & adjust PRN  Microbiology results: 9/26 CSF: sent/pending 9/26 HSV 1/2 Ab IgG/IgM:  sent/pending  9/26 Covid/Flu: pending  9/26 VZV PCR:  sent/pending  Thank you for allowing pharmacy to be a part of this patient's care.  Nathan Hull 12/25/2020 7:10 PM

## 2020-12-25 NOTE — ED Triage Notes (Signed)
Pt reports that yesterday he got up and brushed his teeth states that it felt funny did not think anything of it. He then was eating lunch and his drink dripped down his face. He has a history of Bells Palsy.

## 2020-12-25 NOTE — Telephone Encounter (Signed)
Reason for Disposition  Sounds like a life-threatening emergency to the triager    Mouth is numb and contorted.  Answer Assessment - Initial Assessment Questions 1. SYMPTOM: "Are you having difficulty swallowing liquids, solids, or both?"     Pt calling in.  Started yesterday morning.   THings not taste right.   My tongue is numb on top until today and more of my mouth is getting numb.   I was drinking a drink and it has no taste and difficult to swallow.   My mouth is contorted and numb and getting worse. 2. ONSET: "When did the swallowing problems begin?"      Yesterday morning while brushing his teeth he noticed things did not taste right.  The top of his tongue is numb and today the numbness is spreading through his mouth and his mouth is contorted looking.   I immediately instructed him to call 911.  I let him know he was possibly having symptoms of a stroke.   He was agreeable except I'm going to have someone from work here drive me there now.   I said ok but to go now.  Call ended here. 3. CAUSE: "What do you think is causing the problem?"      *No Answer* 4. CHRONIC/RECURRENT: "Is this a new problem for you?"  If no, ask: "How long have you had this problem?" (e.g., days, weeks, months)      *No Answer* 5. OTHER SYMPTOMS: "Do you have any other symptoms?" (e.g., difficulty breathing, sore throat, swollen tongue, chest pain)     *No Answer* 6. PREGNANCY: "Is there any chance you are pregnant?" "When was your last menstrual period?"     *No Answer*  Protocols used: Swallowing Difficulty-A-AH

## 2020-12-25 NOTE — Telephone Encounter (Signed)
Will attempt to call patient to triage, see nurse triage encounter.

## 2020-12-26 ENCOUNTER — Encounter: Payer: Self-pay | Admitting: Family Medicine

## 2020-12-26 ENCOUNTER — Observation Stay (HOSPITAL_COMMUNITY)
Admit: 2020-12-26 | Discharge: 2020-12-26 | Disposition: A | Discharge status: Home / Self Care | Payer: 59 | Attending: Family Medicine | Admitting: Family Medicine

## 2020-12-26 ENCOUNTER — Observation Stay: Payer: 59

## 2020-12-26 DIAGNOSIS — I252 Old myocardial infarction: Secondary | ICD-10-CM | POA: Diagnosis not present

## 2020-12-26 DIAGNOSIS — Z87891 Personal history of nicotine dependence: Secondary | ICD-10-CM | POA: Diagnosis not present

## 2020-12-26 DIAGNOSIS — E119 Type 2 diabetes mellitus without complications: Secondary | ICD-10-CM | POA: Diagnosis present

## 2020-12-26 DIAGNOSIS — E785 Hyperlipidemia, unspecified: Secondary | ICD-10-CM | POA: Diagnosis present

## 2020-12-26 DIAGNOSIS — I10 Essential (primary) hypertension: Secondary | ICD-10-CM

## 2020-12-26 DIAGNOSIS — G510 Bell's palsy: Secondary | ICD-10-CM

## 2020-12-26 DIAGNOSIS — B003 Herpesviral meningitis: Secondary | ICD-10-CM | POA: Diagnosis present

## 2020-12-26 DIAGNOSIS — E669 Obesity, unspecified: Secondary | ICD-10-CM | POA: Diagnosis present

## 2020-12-26 DIAGNOSIS — Z8673 Personal history of transient ischemic attack (TIA), and cerebral infarction without residual deficits: Secondary | ICD-10-CM | POA: Diagnosis not present

## 2020-12-26 DIAGNOSIS — Z7982 Long term (current) use of aspirin: Secondary | ICD-10-CM | POA: Diagnosis not present

## 2020-12-26 DIAGNOSIS — B028 Zoster with other complications: Secondary | ICD-10-CM | POA: Diagnosis not present

## 2020-12-26 DIAGNOSIS — I4510 Unspecified right bundle-branch block: Secondary | ICD-10-CM | POA: Diagnosis present

## 2020-12-26 DIAGNOSIS — G527 Disorders of multiple cranial nerves: Secondary | ICD-10-CM

## 2020-12-26 DIAGNOSIS — G0490 Encephalitis and encephalomyelitis, unspecified: Secondary | ICD-10-CM | POA: Diagnosis not present

## 2020-12-26 DIAGNOSIS — Z951 Presence of aortocoronary bypass graft: Secondary | ICD-10-CM | POA: Diagnosis not present

## 2020-12-26 DIAGNOSIS — Z833 Family history of diabetes mellitus: Secondary | ICD-10-CM | POA: Diagnosis not present

## 2020-12-26 DIAGNOSIS — I6789 Other cerebrovascular disease: Secondary | ICD-10-CM | POA: Diagnosis present

## 2020-12-26 DIAGNOSIS — E559 Vitamin D deficiency, unspecified: Secondary | ICD-10-CM | POA: Diagnosis present

## 2020-12-26 DIAGNOSIS — R29810 Facial weakness: Secondary | ICD-10-CM | POA: Diagnosis present

## 2020-12-26 DIAGNOSIS — I2510 Atherosclerotic heart disease of native coronary artery without angina pectoris: Secondary | ICD-10-CM | POA: Diagnosis present

## 2020-12-26 DIAGNOSIS — Z79899 Other long term (current) drug therapy: Secondary | ICD-10-CM | POA: Diagnosis not present

## 2020-12-26 DIAGNOSIS — Z20822 Contact with and (suspected) exposure to covid-19: Secondary | ICD-10-CM | POA: Diagnosis present

## 2020-12-26 DIAGNOSIS — Z8249 Family history of ischemic heart disease and other diseases of the circulatory system: Secondary | ICD-10-CM | POA: Diagnosis not present

## 2020-12-26 DIAGNOSIS — Z794 Long term (current) use of insulin: Secondary | ICD-10-CM | POA: Diagnosis not present

## 2020-12-26 DIAGNOSIS — Z87442 Personal history of urinary calculi: Secondary | ICD-10-CM | POA: Diagnosis not present

## 2020-12-26 DIAGNOSIS — Z6830 Body mass index (BMI) 30.0-30.9, adult: Secondary | ICD-10-CM | POA: Diagnosis not present

## 2020-12-26 DIAGNOSIS — M1990 Unspecified osteoarthritis, unspecified site: Secondary | ICD-10-CM | POA: Diagnosis present

## 2020-12-26 DIAGNOSIS — E782 Mixed hyperlipidemia: Secondary | ICD-10-CM

## 2020-12-26 DIAGNOSIS — B029 Zoster without complications: Secondary | ICD-10-CM | POA: Diagnosis present

## 2020-12-26 DIAGNOSIS — Z7952 Long term (current) use of systemic steroids: Secondary | ICD-10-CM | POA: Diagnosis not present

## 2020-12-26 LAB — CBC
HCT: 40.1 % (ref 39.0–52.0)
Hemoglobin: 13.9 g/dL (ref 13.0–17.0)
MCH: 30.3 pg (ref 26.0–34.0)
MCHC: 34.7 g/dL (ref 30.0–36.0)
MCV: 87.6 fL (ref 80.0–100.0)
Platelets: 232 10*3/uL (ref 150–400)
RBC: 4.58 MIL/uL (ref 4.22–5.81)
RDW: 12.9 % (ref 11.5–15.5)
WBC: 7 10*3/uL (ref 4.0–10.5)
nRBC: 0 % (ref 0.0–0.2)

## 2020-12-26 LAB — CSF CELL COUNT WITH DIFFERENTIAL
Eosinophils, CSF: 0 %
Eosinophils, CSF: 0 %
Lymphs, CSF: 62 %
Lymphs, CSF: 92 %
Monocyte-Macrophage-Spinal Fluid: 31 %
Monocyte-Macrophage-Spinal Fluid: 8 %
RBC Count, CSF: 14 /mm3 — ABNORMAL HIGH (ref 0–3)
RBC Count, CSF: 81 /mm3 — ABNORMAL HIGH (ref 0–3)
Segmented Neutrophils-CSF: 0 %
Segmented Neutrophils-CSF: 7 %
Tube #: 1
Tube #: 4
WBC, CSF: 5 /mm3 (ref 0–5)
WBC, CSF: 5 /mm3 (ref 0–5)

## 2020-12-26 LAB — HEMOGLOBIN A1C
Hgb A1c MFr Bld: 7.9 % — ABNORMAL HIGH (ref 4.8–5.6)
Mean Plasma Glucose: 180 mg/dL

## 2020-12-26 LAB — URINALYSIS, ROUTINE W REFLEX MICROSCOPIC
Bacteria, UA: NONE SEEN
Bilirubin Urine: NEGATIVE
Glucose, UA: >=500 mg/dL — AB
Hgb urine dipstick: NEGATIVE
Ketones, ur: NEGATIVE mg/dL
Leukocytes,Ua: NEGATIVE
Nitrite: NEGATIVE
Protein, ur: NEGATIVE mg/dL
Specific Gravity, Urine: 1.013 (ref 1.005–1.030)
Squamous Epithelial / HPF: NONE SEEN (ref 0–5)
pH: 6 (ref 5.0–8.0)

## 2020-12-26 LAB — LIPID PANEL
Cholesterol: 136 mg/dL (ref 0–200)
HDL: 27 mg/dL — ABNORMAL LOW (ref >40)
LDL Cholesterol: 51 mg/dL (ref 0–99)
Total CHOL/HDL Ratio: 5 RATIO
Triglycerides: 291 mg/dL — ABNORMAL HIGH (ref <150)
VLDL: 58 mg/dL — ABNORMAL HIGH (ref 0–40)

## 2020-12-26 LAB — CBG MONITORING, ED: Glucose-Capillary: 127 mg/dL — ABNORMAL HIGH (ref 70–99)

## 2020-12-26 LAB — BASIC METABOLIC PANEL
Anion gap: 10 (ref 5–15)
BUN: 16 mg/dL (ref 6–20)
CO2: 27 mmol/L (ref 22–32)
Calcium: 9 mg/dL (ref 8.9–10.3)
Chloride: 101 mmol/L (ref 98–111)
Creatinine, Ser: 0.85 mg/dL (ref 0.61–1.24)
GFR, Estimated: >60 mL/min (ref >60)
Glucose, Bld: 126 mg/dL — ABNORMAL HIGH (ref 70–99)
Potassium: 4.1 mmol/L (ref 3.5–5.1)
Sodium: 138 mmol/L (ref 135–145)

## 2020-12-26 LAB — ECHOCARDIOGRAM COMPLETE
Height: 72 in
S' Lateral: 2.7 cm
Weight: 4640 oz

## 2020-12-26 LAB — GLUCOSE, CAPILLARY
Glucose-Capillary: 200 mg/dL — ABNORMAL HIGH (ref 70–99)
Glucose-Capillary: 184 mg/dL — ABNORMAL HIGH (ref 70–99)
Glucose-Capillary: 176 mg/dL — ABNORMAL HIGH (ref 70–99)

## 2020-12-26 LAB — URINE DRUG SCREEN, QUALITATIVE (ARMC ONLY)
Amphetamines, Ur Screen: NOT DETECTED
Barbiturates, Ur Screen: NOT DETECTED
Benzodiazepine, Ur Scrn: NOT DETECTED
Cannabinoid 50 Ng, Ur ~~LOC~~: NOT DETECTED
Cocaine Metabolite,Ur ~~LOC~~: NOT DETECTED
MDMA (Ecstasy)Ur Screen: NOT DETECTED
Methadone Scn, Ur: NOT DETECTED
Opiate, Ur Screen: NOT DETECTED
Phencyclidine (PCP) Ur S: NOT DETECTED
Tricyclic, Ur Screen: NOT DETECTED

## 2020-12-26 LAB — HIV ANTIBODY (ROUTINE TESTING W REFLEX): HIV Screen 4th Generation wRfx: NONREACTIVE

## 2020-12-26 LAB — GLUCOSE, CSF: Glucose, CSF: 64 mg/dL (ref 40–70)

## 2020-12-26 LAB — PROTEIN, CSF: Total  Protein, CSF: 72 mg/dL — ABNORMAL HIGH (ref 15–45)

## 2020-12-26 MED ORDER — PREDNISONE 10 MG PO TABS: 5.0000 mg | ORAL_TABLET | Freq: Every day | ORAL | Status: DC

## 2020-12-26 MED ORDER — PREDNISONE 20 MG PO TABS: 60.0000 mg | ORAL_TABLET | Freq: Every day | ORAL | Status: DC

## 2020-12-26 MED ORDER — PREDNISONE 10 MG PO TABS: 10.0000 mg | ORAL_TABLET | Freq: Every day | ORAL | Status: DC

## 2020-12-26 MED ORDER — PREDNISONE 20 MG PO TABS: 30.0000 mg | ORAL_TABLET | Freq: Every day | ORAL | Status: DC

## 2020-12-26 MED ORDER — PREDNISONE 50 MG PO TABS: 50.0000 mg | ORAL_TABLET | Freq: Every day | ORAL | Status: DC

## 2020-12-26 MED ORDER — PREDNISONE 50 MG PO TABS: 60.0000 mg | ORAL_TABLET | Freq: Every day | ORAL | Status: AC

## 2020-12-26 MED ORDER — LIDOCAINE HCL (PF) 1 % IJ SOLN
10.0000 mL | Freq: Once | INTRAMUSCULAR | Status: AC
  Administered 2020-12-26: 5 mL

## 2020-12-26 MED ORDER — PREDNISONE 20 MG PO TABS: 20.0000 mg | ORAL_TABLET | Freq: Every day | ORAL | Status: DC

## 2020-12-26 MED ORDER — PREDNISONE 50 MG PO TABS
60.0000 mg | ORAL_TABLET | Freq: Every day | ORAL | Status: DC
  Administered 2020-12-27: 60 mg via ORAL
  Filled 2020-12-26: qty 1

## 2020-12-26 MED ORDER — INSULIN ASPART 100 UNIT/ML IJ SOLN
0.0000 [IU] | Freq: Three times a day (TID) | INTRAMUSCULAR | Status: DC
  Administered 2020-12-26 (×3): 2 [IU] via SUBCUTANEOUS
  Administered 2020-12-27: 13:00:00 1 [IU] via SUBCUTANEOUS
  Filled 2020-12-26 (×4): qty 1

## 2020-12-26 MED ORDER — PREDNISONE 20 MG PO TABS: 40.0000 mg | ORAL_TABLET | Freq: Every day | ORAL | Status: DC

## 2020-12-26 NOTE — Procedures (Signed)
PROCEDURE SUMMARY:  Successful fluoroscopic guided lumbar puncture.  Yielded 8 mL of clear CSF fluid.  No immediate complications.  Pt tolerated well.   Specimen was sent for labs.  EBL < 37mL  Rockney Ghee 12/26/2020 9:29 AM

## 2020-12-26 NOTE — Progress Notes (Signed)
SLP Follow Up Note  Patient Details Name: Nathan Hull MRN: [id number] DOB: 1960-10-02  Pt continues under observation for right facial weakness that is not attributed to neurological insult. Pt is awaiting differential diagnosis and is aware that pending diagnosis, pt can seek follow up ST services if appropriate at discharge. Pt is in agreement with this plan vs. Acute ST intervention during this hospitalization.   Please reconsult ST should medical intervention be unable to treat current presentation.   Fynlee Rowlands B. Rutherford Nail M.S., CCC-SLP, [place] [facility] [phone]  Nancylee Gaines Rutherford Nail 12/26/2020, 8:30 AM

## 2020-12-26 NOTE — Progress Notes (Signed)
PROGRESS NOTE  Nathan Hull MOQ:[id number] DOB: 12-Feb-1961 DOA: 12/25/2020 PCP: Gwyneth Sprout, FNP   LOS: 0 days   Brief narrative:  Nathan Hull is a 60 y.o. Caucasian male with past medical history significant for multiple medical problems including coronary artery disease, hypertension, dyslipidemia, type 2 diabetes mellitus presented to the hospital with right-sided facial weakness.  Patient did have a history of  herpes zoster on 9/15 that started with a stye on his upper eyelid.  He then was given antibiotic on the same day.  Later on he started having herpetic eruption on his forehead and saw his primary care physician.  He was then placed on Valtrex for 10 days as well as Neurontin.  Patient subsequently continued to feel numbness on his tongue and had difficulty swallowing with right-sided burning sensation over the eye globe.  He was then seen by ophthalmologist and his vision was fine.  He continued to have right lower facial weakness and presented to the ED where a code stroke was called in.  He did have CTA of the head and neck and lumbar puncture was recommended by neurology.  Patient was then admitted to the hospital.  Initial vitals were normal.   Assessment/Plan:  Principal Problem:   Facial paralysis Active Problems:   S/P CABG x 3   Diabetes mellitus without complication (HCC)   HLD (hyperlipidemia)   Benign essential HTN   Right-sided facial weakness, tongue deviation More on the lower face.  Likely secondary to herpes zoster with cranial nerve involvement, patient was recently treated with acyclovir as outpatient for herpes zoster.  On IV acyclovir in the hospital.  Neurology has seen the patient.  Recommended prednisone taper and IV acyclovir for 14-day.  Follow lumbar puncture findings.  MRI of the brain was negative for acute findings.  CTA of the head and neck was negative for vascular pathology.  CT head scan was negative as well.  Currently on aspirin Plavix and  statins.  Type II diabetes mellitus. Continue Wilder Glade, continue sliding scale insulin Accu-Cheks.  Dyslipidemia. Continue statins.  Essential hypertension. Continue Zestril  Vitamin D deficiency. Continue vitamin D   DVT prophylaxis: enoxaparin (LOVENOX) injection 40 mg Start: 12/25/20 2200   Code Status: Full code  Family Communication: None  Status is: Observation  The patient will require care spanning > 2 midnights and should be moved to inpatient because: Ongoing diagnostic testing needed not appropriate for outpatient work up, IV treatments appropriate due to intensity of illness or inability to take PO, and Inpatient level of care appropriate due to severity of illness  Dispo: The patient is from: Home              Anticipated d/c is to: Home              Patient currently is not medically stable to d/c.   Difficult to place patient No   Consultants: Neurology  Procedures: None so far  Anti-infectives:  Acyclovir  Anti-infectives (From admission, onward)    Start     Dose/Rate Route Frequency Ordered Stop   12/26/20 0700  valACYclovir (VALTREX) tablet 1,000 mg  Status:  Discontinued        1,000 mg Oral 3 times daily 12/25/20 2017 12/25/20 2037   12/25/20 2000  acyclovir (ZOVIRAX) 1,000 mg in dextrose 5 % 150 mL IVPB        1,000 mg 170 mL/hr over 60 Minutes Intravenous Every 8 hours 12/25/20 1908  Subjective: Today, patient was seen and examined at bedside.  Patient still complains of mild tingling over the right facial area with numbness.  Objective: Vitals:   12/26/20 0400 12/26/20 0804  BP: (!) 155/83 (!) 144/81  Pulse: 60 (!) 49  Resp: 16 18  Temp:    SpO2: 98% 96%   No intake or output data in the 24 hours ending 12/26/20 1107 Filed Weights   12/25/20 1438  Weight: 131.5 kg   Body mass index is 39.33 kg/m.   Physical Exam: GENERAL: Patient is alert awake and oriented. Not in obvious distress.  Obese HENT: No scleral pallor  or icterus. Pupils equally reactive to light. Oral mucosa is moist.  Blackish discoloration over the right upper eyelid.  Tongue slightly deviated to the left. NECK: is supple, no gross swelling noted. CHEST: Clear to auscultation. No crackles or wheezes.  Diminished breath sounds bilaterally. CVS: S1 and S2 heard, no murmur. Regular rate and rhythm.  ABDOMEN: Soft, non-tender, bowel sounds are present. EXTREMITIES: No edema. CNS: Mild right-sided facial weakness.  No focal motor deficits. SKIN: warm and dry,  Data Review: I have personally reviewed the following laboratory data and studies,  CBC: Recent Labs  Lab 12/25/20 1519 12/26/20 0734  WBC 8.3 7.0  NEUTROABS 5.2  --   HGB 14.3 13.9  HCT 41.1 40.1  MCV 88.2 87.6  PLT 264 902   Basic Metabolic Panel: Recent Labs  Lab 12/25/20 1519 12/26/20 0734  NA 136 138  K 4.3 4.1  CL 102 101  CO2 26 27  GLUCOSE 147* 126*  BUN 20 16  CREATININE 1.01 0.85  CALCIUM 9.1 9.0   Liver Function Tests: Recent Labs  Lab 12/25/20 1519  AST 26  ALT 24  ALKPHOS 65  BILITOT 0.9  PROT 7.0  ALBUMIN 4.2   No results for input(s): LIPASE, AMYLASE in the last 168 hours. No results for input(s): AMMONIA in the last 168 hours. Cardiac Enzymes: No results for input(s): CKTOTAL, CKMB, CKMBINDEX, TROPONINI in the last 168 hours. BNP (last 3 results) No results for input(s): BNP in the last 8760 hours.  ProBNP (last 3 results) No results for input(s): PROBNP in the last 8760 hours.  CBG: Recent Labs  Lab 12/25/20 1827 12/26/20 0944  GLUCAP 94 127*   Recent Results (from the past 240 hour(s))  Resp Panel by RT-PCR (Flu A&B, Covid) Nasopharyngeal Swab     Status: None   Collection Time: 12/25/20  7:21 PM   Specimen: Nasopharyngeal Swab; Nasopharyngeal(NP) swabs in vial transport medium  Result Value Ref Range Status   SARS Coronavirus 2 by RT PCR NEGATIVE NEGATIVE Final    Comment: (NOTE) SARS-CoV-2 target nucleic acids are NOT  DETECTED.  The SARS-CoV-2 RNA is generally detectable in upper respiratory specimens during the acute phase of infection. The lowest concentration of SARS-CoV-2 viral copies this assay can detect is 138 copies/mL. A negative result does not preclude SARS-Cov-2 infection and should not be used as the sole basis for treatment or other patient management decisions. A negative result may occur with  improper specimen collection/handling, submission of specimen other than nasopharyngeal swab, presence of viral mutation(s) within the areas targeted by this assay, and inadequate number of viral copies(<138 copies/mL). A negative result must be combined with clinical observations, patient history, and epidemiological information. The expected result is Negative.  Fact Sheet for Patients:  EntrepreneurPulse.com.au  Fact Sheet for Healthcare Providers:  IncredibleEmployment.be  This test is no t yet  approved or cleared by the [place] and  has been authorized for detection and/or diagnosis of SARS-CoV-2 by FDA under an Emergency Use Authorization (EUA). This EUA will remain  in effect (meaning this test can be used) for the duration of the COVID-19 declaration under Section 564(b)(1) of the Act, 21 U.S.C.section 360bbb-3(b)(1), unless the authorization is terminated  or revoked sooner.       Influenza A by PCR NEGATIVE NEGATIVE Final   Influenza B by PCR NEGATIVE NEGATIVE Final    Comment: (NOTE) The Xpert Xpress SARS-CoV-2/FLU/RSV plus assay is intended as an aid in the diagnosis of influenza from Nasopharyngeal swab specimens and should not be used as a sole basis for treatment. Nasal washings and aspirates are unacceptable for Xpert Xpress SARS-CoV-2/FLU/RSV testing.  Fact Sheet for Patients: EntrepreneurPulse.com.au  Fact Sheet for Healthcare Providers: IncredibleEmployment.be  This test is not yet  approved or cleared by the [place] FDA and has been authorized for detection and/or diagnosis of SARS-CoV-2 by FDA under an Emergency Use Authorization (EUA). This EUA will remain in effect (meaning this test can be used) for the duration of the COVID-19 declaration under Section 564(b)(1) of the Act, 21 U.S.C. section 360bbb-3(b)(1), unless the authorization is terminated or revoked.  Performed at [facility], [address], [place], [place] [address]      Studies: CT ANGIO HEAD NECK W WO CM  Result Date: 12/25/2020 CLINICAL DATA:  Facial droop.  History of Bell's palsy. EXAM: CT ANGIOGRAPHY HEAD AND NECK TECHNIQUE: Multidetector CT imaging of the head and neck was performed using the standard protocol during bolus administration of intravenous contrast. Multiplanar CT image reconstructions and MIPs were obtained to evaluate the vascular anatomy. Carotid stenosis measurements (when applicable) are obtained utilizing NASCET criteria, using the distal internal carotid diameter as the denominator. CONTRAST:  69mL OMNIPAQUE IOHEXOL 350 MG/ML SOLN COMPARISON:  Head CT 12/25/2020 FINDINGS: CTA NECK FINDINGS SKELETON: There is no bony spinal canal stenosis. No lytic or blastic lesion. C5-7 ACDF. OTHER NECK: Normal pharynx, larynx and major salivary glands. No cervical lymphadenopathy. Unremarkable thyroid gland. UPPER CHEST: No pneumothorax or pleural effusion. No nodules or masses. AORTIC ARCH: There is calcific atherosclerosis of the aortic arch. There is no aneurysm, dissection or hemodynamically significant stenosis of the visualized portion of the aorta. Conventional 3 vessel aortic branching pattern. The visualized proximal subclavian arteries are widely patent. RIGHT CAROTID SYSTEM: No dissection, occlusion or aneurysm. Mild atherosclerotic calcification at the carotid bifurcation without hemodynamically significant stenosis. LEFT CAROTID SYSTEM: No dissection, occlusion or  aneurysm. Mild atherosclerotic calcification at the carotid bifurcation without hemodynamically significant stenosis. VERTEBRAL ARTERIES: Left dominant configuration. Both origins are clearly patent. There is no dissection, occlusion or flow-limiting stenosis to the skull base (V1-V3 segments). CTA HEAD FINDINGS POSTERIOR CIRCULATION: --Vertebral arteries: Normal left V4 segment. Right V4 segment appears to terminate in PICA. --Inferior cerebellar arteries: Normal. --Basilar artery: Normal. --Superior cerebellar arteries: Normal. --Posterior cerebral arteries (PCA): Normal. ANTERIOR CIRCULATION: --Intracranial internal carotid arteries: Normal. --Anterior cerebral arteries (ACA): Normal. Both A1 segments are present. Patent anterior communicating artery (a-comm). --Middle cerebral arteries (MCA): Normal. VENOUS SINUSES: As permitted by contrast timing, patent. ANATOMIC VARIANTS: None Review of the MIP images confirms the above findings. IMPRESSION: 1. No emergent large vessel occlusion or high-grade stenosis of the head or neck. 2. Mild bilateral carotid bifurcation atherosclerosis without hemodynamically significant stenosis by NASCET criteria. Aortic Atherosclerosis (ICD10-I70.0). Electronically Signed   By: Cletus Gash.D.  On: 12/25/2020 21:50   CT HEAD WO CONTRAST (5MM)  Result Date: 12/25/2020 CLINICAL DATA:  Pt reports that yesterday he got up and brushed his teeth states that it felt funny did not think anything of it. He then was eating lunch and his drink dripped down his face. He has a history of Bells Palsy EXAM: CT HEAD WITHOUT CONTRAST TECHNIQUE: Contiguous axial images were obtained from the base of the skull through the vertex without intravenous contrast. COMPARISON:  CT head 02/27/2004 FINDINGS: Brain: No evidence of large-territorial acute infarction. No parenchymal hemorrhage. No mass lesion. No extra-axial collection. No mass effect or midline shift. No hydrocephalus. Basilar cisterns  are patent. Vascular: No hyperdense vessel. Skull: No acute fracture or focal lesion. Sinuses/Orbits: Paranasal sinuses and mastoid air cells are clear. The orbits are unremarkable. Other: None. IMPRESSION: No acute intracranial abnormality. Electronically Signed   By: Iven Finn M.D.   On: 12/25/2020 18:00   MR BRAIN W WO CONTRAST  Result Date: 12/25/2020 CLINICAL DATA:  Neuro deficit, acute, stroke suspected Bell's palsy vs. Stroke, thin cuts through brainstem to eval for CN VII enhancement and potential XI/X involvement EXAM: MRI HEAD WITHOUT AND WITH CONTRAST TECHNIQUE: Multiplanar, multiecho pulse sequences of the brain and surrounding structures were obtained without and with intravenous contrast. CONTRAST:  47mL GADAVIST GADOBUTROL 1 MMOL/ML IV SOLN COMPARISON:  Brain MRI 02/28/2004 FINDINGS: Brain: No acute infarct, mass effect or extra-axial collection. No acute or chronic hemorrhage. There is multifocal hyperintense T2-weighted signal within the white matter. Parenchymal volume and CSF spaces are normal. The midline structures are normal. INTERNAL AUDITORY CANALS: There is no cerebellopontine angle mass. The cochleae and semicircular canals are normal. No focal abnormality along the course of the 7th and 8th cranial nerves. There is normal, symmetric contrast enhancement of the intratemporal segments of the facial nerves. Normal porus acusticus and vestibular aqueduct bilaterally. Vascular: Major flow voids are preserved. Skull and upper cervical spine: Normal calvarium and skull base. Visualized upper cervical spine and soft tissues are normal. Sinuses/Orbits:No paranasal sinus fluid levels or advanced mucosal thickening. No mastoid or middle ear effusion. Normal orbits. IMPRESSION: 1. Normal MRI of the internal auditory canals and inner ear structures. 2. No acute intracranial abnormality. 3. Findings of mild chronic microvascular ischemia. Electronically Signed   By: Ulyses Jarred M.D.   On:  12/25/2020 21:29   DG FL GUIDED LUMBAR PUNCTURE  Result Date: 12/26/2020 CLINICAL DATA:  Encephalitis, request received for diagnostic lumbar puncture. EXAM: DIAGNOSTIC LUMBAR PUNCTURE UNDER FLUOROSCOPIC GUIDANCE COMPARISON:  MRI brain imaging performed 12/25/2020 and CT head imaging performed 12/25/2020 was reviewed prior to the procedure. FLUOROSCOPY TIME:  Fluoroscopy Time:  0.2 minute Radiation Exposure Index (if provided by the fluoroscopic device): 8.4 mGy Number of Acquired Spot Images: 2 PROCEDURE: Informed consent was obtained from the patient prior to the procedure, including potential complications of headache, infection, bleeding, CSF leak requiring additional procedures, nerve damage, allergy, and pain. With the patient prone, the lower back was prepped with Betadine. 1% Lidocaine was used for local anesthesia. Lumbar puncture was performed at the L3-L4 level using a 20 gauge needle with return of clear colorless CSF with an opening pressure of 22 cm water and closing pressure of 17 cm of water. 8 ml of CSF were obtained for laboratory studies. The patient tolerated the procedure well and there were no apparent complications. A sterile bandage was applied. IMPRESSION: Technically successful fluoroscopic guided lumbar puncture. Read By: Tsosie Billing PA-C Electronically Signed  By: Kathreen Devoid M.D.   On: 12/26/2020 09:55      Flora Lipps, MD  [facility] 12/26/2020  If 7PM-7AM, please contact night-coverage

## 2020-12-26 NOTE — Evaluation (Signed)
Occupational Therapy Evaluation Patient Details Name: Nathan Hull MRN: [id number] DOB: [birth year] Today's Date: 12/26/2020   History of Present Illness 60 y.o. Caucasian male with past medical history significant for multiple medical problems including coronary artery disease, hypertension, dyslipidemia, type 2 diabetes mellitus presented to the hospital with right-sided facial weakness.  Patient did have a history of  herpes zoster on 9/15 that started with a stye on his upper eyelid.   Clinical Impression   Pt supine in bed upon entering the room. Pt is very pleasant and agreeable to OT intervention this session. Pt performing functional mobility and self care tasks without use of AD and without assist. Discussed home set up and pt has not concerns in regards to returning home. 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 OT directed questions to RN in the room. Pt also reports some pocketing of food on R side and difficulty chewing certain types of food. Pt is very self aware but OT talked to RN about SLP to assess further for safety. Pt is agreeable. Pt with no further OT concerns at this time. OT to SIGN OFF. Thank you for this referral.      Recommendations for follow up therapy are one component of a multi-disciplinary discharge planning process, led by the attending physician.  Recommendations may be updated based on patient status, additional functional criteria and insurance authorization.   Follow Up Recommendations  No OT follow up    Equipment Recommendations  None recommended by OT    Recommendations for Other Services Speech consult     Precautions / Restrictions Precautions Precautions: None Restrictions Weight Bearing Restrictions: No      Mobility Bed Mobility Overal bed mobility: Independent                  Transfers Overall transfer level: Independent                    Balance Overall balance  assessment: Independent                                         ADL either performed or assessed with clinical judgement   ADL Overall ADL's : Independent                                             Vision Patient Visual Report: No change from baseline              Pertinent Vitals/Pain Pain Assessment: Faces Faces Pain Scale: Hurts little more Pain Location: pain from lumbar puncture Pain Intervention(s): Limited activity within patient's tolerance     Hand Dominance     Extremity/Trunk Assessment Upper Extremity Assessment Upper Extremity Assessment: Overall WFL for tasks assessed   Lower Extremity Assessment Lower Extremity Assessment: Overall WFL for tasks assessed       Communication Communication Communication: No difficulties   Cognition Arousal/Alertness: Awake/alert Behavior During Therapy: WFL for tasks assessed/performed Overall Cognitive Status: Within Functional Limits for tasks assessed  Home Living Family/patient expects to be discharged to:: Private residence Living Arrangements: Alone Available Help at Discharge: Family;Available PRN/intermittently Type of Home: House                       Home Equipment: None          Prior Functioning/Environment Level of Independence: Independent                          OT Goals(Current goals can be found in the care plan section) Acute Rehab OT Goals Patient Stated Goal: go home OT Goal Formulation: With patient Time For Goal Achievement: 12/26/20 Potential to Achieve Goals: Good   AM-PAC OT "6 Clicks" Daily Activity     Outcome Measure Help from another person eating meals?: None Help from another person taking care of personal grooming?: None Help from another person toileting, which includes using toliet, bedpan, or urinal?: None Help from another person bathing (including  washing, rinsing, drying)?: None Help from another person to put on and taking off regular upper body clothing?: None Help from another person to put on and taking off regular lower body clothing?: None 6 Click Score: 24   End of Session Nurse Communication: Mobility status;Other (comment) (swallow evaluation?)  Activity Tolerance: Patient tolerated treatment well Patient left: in bed;with call bell/phone within reach;with family/visitor present                   Time: 1594-5859 OT Time Calculation (min): 21 min Charges:  OT General Charges $OT Visit: 1 Visit OT Evaluation $OT Eval Low Complexity: 1 Low OT Treatments $Therapeutic Activity: 8-22 mins  Darleen Crocker, MS, OTR/L , CBIS ascom [phone]  12/26/20, 4:05 PM

## 2020-12-26 NOTE — ED Notes (Signed)
Informed RN bed assigned 

## 2020-12-26 NOTE — ED Notes (Signed)
Patient transported to CT 

## 2020-12-26 NOTE — Progress Notes (Signed)
OT Cancellation Note  Patient Details Name: Nathan Hull MRN: [id number] DOB: [birth year]   Cancelled Treatment:    Reason Eval/Treat Not Completed: Medical issues which prohibited therapy. OT order received and chart reviewed. Per RN report, pt with lumbar puncture this morning and on bedrest until 1130. OT will re-attempt eval at that time.  Darleen Crocker, MS, OTR/L , CBIS ascom [phone]  12/26/20, 10:49 AM

## 2020-12-26 NOTE — Progress Notes (Addendum)
Neurology Progress Note  Patient ID: Nathan Hull is a 60 y.o. who has  has a past medical history of Arthritis, CAD (coronary artery disease), Diabetes mellitus without complication ([place]), Difficult intubation (09/24/2013), Dyspnea, Family history of anesthesia complication, History of kidney stones, Hyperlipidemia, Hypertension, Myocardial infarct ([place]) (2007), Obesity, and Wears contact lenses. As well as Bell's palsy of the right face (late 1990s, no residual), presenting with right facial droop in the context of recent shingles rash s/p full oral course of valacylovir   Major interval events:  - Failed bedside LP, s/p IR guided LP   Subjective: -Reports he feels like his facial droop is slightly worse but denies any other new complaints including headache or new neurological symptoms  Exam: Vitals:   12/26/20 0400 12/26/20 0804  BP: (!) 155/83 (!) 144/81  Pulse: 60 (!) 49  Resp: 16 18  Temp:    SpO2: 98% 96%   Gen: In bed, comfortable  Resp: non-labored breathing, no grossly audible wheezing Cardiac: Perfusing extremities well  Abd: soft, nt  Neuro: MS: Awake, alert, fluent speech, follows commands, oriented to person/place/situation CN: Visual fields full, pupils equal round reactive to light, right facial droop now clearly involving the upper as well as lower face, hearing intact to finger rub and tuning fork bilaterally, facial sensation symmetric to temperature V1, 2 and 3; taste but not general sensation is impaired in a patchy distribution on the tongue; tongue with slight deviation to the left and does not elevate as well posteriorly on the right, although uvula is midline Motor: Full confrontational testing deferred given recent lumbar puncture to avoid post LP headache.  No pronator drift bilaterally.  5/5 foot dorsiflexion and plantarflexion bilaterally Sensory: Intact to light touch throughout  Pertinent Labs:  Basic Metabolic Panel: Recent Labs  Lab  12/25/20 1519 12/26/20 0734  NA 136 138  K 4.3 4.1  CL 102 101  CO2 26 27  GLUCOSE 147* 126*  BUN 20 16  CREATININE 1.01 0.85  CALCIUM 9.1 9.0    CBC: Recent Labs  Lab 12/25/20 1519 12/26/20 0734  WBC 8.3 7.0  NEUTROABS 5.2  --   HGB 14.3 13.9  HCT 41.1 40.1  MCV 88.2 87.6  PLT 264 232    Coagulation Studies: Recent Labs    12/25/20 1519  LABPROT 13.3  INR 1.0    Addendum: CSF results that were not available at time of my initial note: WBC 5/5, RBC 14/81, Protein 72, Glu 64 (serum 127) Gram stain negative,  Viral studies pending  Impression: This is a 60 year old man with recent herpetic rash now presenting with suggestion of multiple cranial neuropathies (right cranial nerve VII most prominently, but also suggestion of involvement of IX/X given tongue hemidepression, deviation contralateral to the side of the droop, as well as possible involvement of the left cranial nerve VII given patchy taste sensation bilaterally.  CSF studies will take some time to result but will start steroids and plan to continue for full course of VZV/HSV treatment.  If he remains clinically stable, IV treatment may be completed on an outpatient basis  Recommendations: -Prednisone 60 mg x 7 days followed by taper (10 mg reduction daily with 5 mg dose on the last day, as ordered) -Continue IV acyclovir for 14-day course -Addendum: CSF studies potentially c/w partially treated CNS herpetic process. No additional recommendations, remainder of the studies should be followed up by primary team or neurologist on discharge -Neurology will be available as needed, please  page or reconsult if patient worsens or new questions arise  Lesleigh Noe MD-PhD [facility] [phone]   Greater than 35 minutes were spent in care of this patient today, greater than 50% at bedside

## 2020-12-26 NOTE — Evaluation (Signed)
Physical Therapy Evaluation Patient Details Name: Nathan Hull MRN: [id number] DOB: 29-Sep-1960 Today's Date: 12/26/2020  History of Present Illness  61 y.o. Caucasian male with past medical history significant for multiple medical problems including coronary artery disease, hypertension, dyslipidemia, type 2 diabetes mellitus presented to the hospital with right-sided facial weakness.  Patient did have a history of  herpes zoster on 9/15 that started with a stye on his upper eyelid.  Clinical Impression  Pt did well with PT exam and from a mobility, independent, neuro screen, etc point of view he is at his independent baseline with good confidence and no complaints.  He has family that lives next door and will be able to assist in the unlikely instance he were to need assist, continues to have facial droop but he does not have issues thatr require further PT intervention.     Recommendations for follow up therapy are one component of a multi-disciplinary discharge planning process, led by the attending physician.  Recommendations may be updated based on patient status, additional functional criteria and insurance authorization.  Follow Up Recommendations No PT follow up    Equipment Recommendations  None recommended by PT    Recommendations for Other Services       Precautions / Restrictions Precautions Precautions: None Restrictions Weight Bearing Restrictions: No      Mobility  Bed Mobility Overal bed mobility: Independent                  Transfers Overall transfer level: Independent Equipment used: None             General transfer comment: Pt easily able to rise to standing w/o assist or hesitantion  Ambulation/Gait Ambulation/Gait assistance: Modified independent (Device/Increase time) Gait Distance (Feet): 150 Feet Assistive device: None       General Gait Details: Pt easily and confidently ambulate with consistent cadnece, speed and no balance or  safety issues.  Stairs            Wheelchair Mobility    Modified Rankin (Stroke Patients Only)       Balance Overall balance assessment: Independent                                           Pertinent Vitals/Pain Pain Assessment: Faces Faces Pain Scale: Hurts little more Pain Location: pain from lumbar puncture    Home Living Family/patient expects to be discharged to:: Private residence Living Arrangements: Alone Available Help at Discharge: Family;Available PRN/intermittently           Home Equipment: None      Prior Function Level of Independence: Independent         Comments: works in maintainance, on feet a lot     Journalist, newspaper        Extremity/Trunk Assessment   Upper Extremity Assessment Upper Extremity Assessment: Overall WFL for tasks assessed    Lower Extremity Assessment Lower Extremity Assessment: Overall WFL for tasks assessed       Communication   Communication: No difficulties  Cognition Arousal/Alertness: Awake/alert Behavior During Therapy: WFL for tasks assessed/performed Overall Cognitive Status: Within Functional Limits for tasks assessed                                        General  Comments General comments (skin integrity, edema, etc.): Pt reports that apart from continued droop he otherwise feels normal with no sensation, coordination, vision, etc changes    Exercises     Assessment/Plan    PT Assessment Patent does not need any further PT services  PT Problem List         PT Treatment Interventions      PT Goals (Current goals can be found in the Care Plan section)  Acute Rehab PT Goals Patient Stated Goal: go home PT Goal Formulation: All assessment and education complete, DC therapy    Frequency     Barriers to discharge        Co-evaluation               AM-PAC PT "6 Clicks" Mobility  Outcome Measure Help needed turning from your back to your side  while in a flat bed without using bedrails?: None Help needed moving from lying on your back to sitting on the side of a flat bed without using bedrails?: None Help needed moving to and from a bed to a chair (including a wheelchair)?: None Help needed standing up from a chair using your arms (e.g., wheelchair or bedside chair)?: None Help needed to walk in hospital room?: None Help needed climbing 3-5 steps with a railing? : None 6 Click Score: 24    End of Session Equipment Utilized During Treatment: Gait belt Activity Tolerance: Patient tolerated treatment well Patient left: in bed;with family/visitor present;with call bell/phone within reach Nurse Communication: Mobility status PT Visit Diagnosis: Unsteadiness on feet (R26.81);Other symptoms and signs involving the nervous system (R29.898)    Time: 1132-1150 PT Time Calculation (min) (ACUTE ONLY): 18 min   Charges:   PT Evaluation $PT Eval Low Complexity: 1 Low          Kreg Shropshire, DPT 12/26/2020, 12:11 PM

## 2020-12-26 NOTE — Progress Notes (Signed)
*  PRELIMINARY RESULTS* Echocardiogram 2D Echocardiogram has been performed.  Sherrie Sport 12/26/2020, 10:22 AM

## 2020-12-27 ENCOUNTER — Inpatient Hospital Stay: Payer: Self-pay

## 2020-12-27 DIAGNOSIS — I10 Essential (primary) hypertension: Secondary | ICD-10-CM | POA: Diagnosis not present

## 2020-12-27 DIAGNOSIS — E119 Type 2 diabetes mellitus without complications: Secondary | ICD-10-CM | POA: Diagnosis not present

## 2020-12-27 DIAGNOSIS — B028 Zoster with other complications: Secondary | ICD-10-CM | POA: Diagnosis not present

## 2020-12-27 DIAGNOSIS — G510 Bell's palsy: Secondary | ICD-10-CM | POA: Diagnosis not present

## 2020-12-27 LAB — IGG CSF INDEX
Albumin CSF-mCnc: 51 mg/dL (ref 15–55)
Albumin: 4.1 g/dL (ref 3.8–4.9)
CSF IgG Index: 0.6 (ref 0.0–0.7)
IgG (Immunoglobin G), Serum: 887 mg/dL (ref 603–1613)
IgG, CSF: 6.7 mg/dL (ref 0.0–10.3)
IgG/Alb Ratio, CSF: 0.13 (ref 0.00–0.25)

## 2020-12-27 LAB — GLUCOSE, CAPILLARY
Glucose-Capillary: 159 mg/dL — ABNORMAL HIGH (ref 70–99)
Glucose-Capillary: 143 mg/dL — ABNORMAL HIGH (ref 70–99)
Glucose-Capillary: 173 mg/dL — ABNORMAL HIGH (ref 70–99)

## 2020-12-27 LAB — CBC
HCT: 44.4 % (ref 39.0–52.0)
Hemoglobin: 14.6 g/dL (ref 13.0–17.0)
MCH: 29 pg (ref 26.0–34.0)
MCHC: 32.9 g/dL (ref 30.0–36.0)
MCV: 88.3 fL (ref 80.0–100.0)
Platelets: 273 10*3/uL (ref 150–400)
RBC: 5.03 MIL/uL (ref 4.22–5.81)
RDW: 12.9 % (ref 11.5–15.5)
WBC: 7.3 10*3/uL (ref 4.0–10.5)
nRBC: 0 % (ref 0.0–0.2)

## 2020-12-27 LAB — MAGNESIUM: Magnesium: 2.1 mg/dL (ref 1.7–2.4)

## 2020-12-27 LAB — BASIC METABOLIC PANEL
Anion gap: 7 (ref 5–15)
BUN: 14 mg/dL (ref 6–20)
CO2: 29 mmol/L (ref 22–32)
Calcium: 9.1 mg/dL (ref 8.9–10.3)
Chloride: 102 mmol/L (ref 98–111)
Creatinine, Ser: 0.9 mg/dL (ref 0.61–1.24)
GFR, Estimated: >60 mL/min (ref >60)
Glucose, Bld: 136 mg/dL — ABNORMAL HIGH (ref 70–99)
Potassium: 4.2 mmol/L (ref 3.5–5.1)
Sodium: 138 mmol/L (ref 135–145)

## 2020-12-27 LAB — VARICELLA ZOSTER ANTIBODY, IGG: Varicella IgG: >4000 index (ref >165)

## 2020-12-27 MED ORDER — DEXTROSE 5 % IV SOLN: 1000.0000 mg | Freq: Three times a day (TID) | INTRAVENOUS | 0 refills | Status: AC

## 2020-12-27 MED ORDER — PREDNISONE 10 MG PO TABS: ORAL_TABLET | ORAL | 0 refills | Status: DC

## 2020-12-27 MED ORDER — DEXTROSE 5 % IV SOLN: 1000.0000 mg | Freq: Three times a day (TID) | INTRAVENOUS | 0 refills | Status: DC

## 2020-12-27 MED ORDER — SODIUM CHLORIDE 0.9% FLUSH
10.0000 mL | Freq: Two times a day (BID) | INTRAVENOUS | Status: DC
Start: 2020-12-27 — End: 2020-12-27
  Administered 2020-12-27: 10 mL

## 2020-12-27 MED ORDER — CHLORHEXIDINE GLUCONATE CLOTH 2 % EX PADS: 6.0000 | MEDICATED_PAD | Freq: Every day | CUTANEOUS | Status: DC

## 2020-12-27 MED ORDER — SODIUM CHLORIDE 0.9% FLUSH: 10.0000 mL | INTRAVENOUS | Status: DC | PRN

## 2020-12-27 NOTE — Discharge Summary (Signed)
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 Caucasian male with past medical history significant for multiple medical problems including coronary artery disease, hypertension, dyslipidemia, type 2 diabetes mellitus presented to the hospital with right-sided facial weakness.  Patient did have a history of  herpes zoster on 9/15 that started with a stye on his upper eyelid.  He then was given antibiotic on the same Hull.  Later on he started having herpetic eruption on his forehead and saw his primary care physician.  He was then placed on Valtrex for 10 days as well as Neurontin.  Patient subsequently continued to feel numbness on his tongue and had difficulty swallowing with right-sided burning sensation over the eye globe.  He was then seen by ophthalmologist and his vision was fine.  He continued to have right lower facial weakness and presented to the ED where a code stroke was called in.  He did have CTA of the head and neck and lumbar puncture was recommended by neurology.  Patient was then admitted to the hospital.  Initial vitals were normal.   Hospital Course:  Right-sided facial droop -Also noted to have tongue deviating to the left and loss of taste - Recently completed treatment  for herpes zoster with started on his right upper eyelid - He has been evaluated by neurology who recommends a 14-Hull course of IV acyclovir in addition to a prolonged prednisone taper to treat (probable) involvement of cranial nerves VII, IX and X related to herpes zoster -Treatment was initiated yesterday and patient states today that his taste buds appear to be recovering -The patient is receiving a PICC line today and will be discharged home with home health to continue acyclovir until course is completed - Primary care provider will need to order removal of PICC line  Diabetes mellitus type II -Hemoglobin A1C    Component Value Date/Time   HGBA1C 7.9 (H) 12/26/2020 0734  -Continue Farxiga and carb modified diet  Essential hypertension - Continue Zestril  Dyslipidemia - Continue statin   Discharge Exam: Vitals:   12/27/20 0333 12/27/20 0735  BP: 137/75 (!) 167/93  Pulse: (!) 58 (!) 57  Resp: 18 16  Temp: 98 F (36.7 C) 98 F (36.7 C)  SpO2: 96% 100%   Vitals:   12/26/20 2038 12/27/20 0051 12/27/20 0333 12/27/20 0735  BP: 137/74 129/72 137/75 (!) 167/93  Pulse: 65 60 (!) 58 (!) 57  Resp: 18 18 18 16   Temp: 98.2 F (36.8 C) 97.6 F (36.4 C) 98 F (36.7 C) 98 F (36.7 C)  TempSrc: Oral  Oral Oral  SpO2: 98% 98% 96% 100%  Weight:      Height:        General: Pt is alert, awake, not in acute distress Cardiovascular: RRR, S1/S2 +, no rubs, no gallops Respiratory: CTA  bilaterally, no wheezing, no rhonchi Abdominal: Soft, NT, ND, bowel sounds + Extremities: no edema, no cyanosis Neurologic: Right facial droop and mild swelling of right eyelid noted, deviation of tongue to the left noted   Discharge Instructions  Discharge Instructions     [facility] pharmacist to adjust dose for Vancomycin, Aminoglycosides and other anti-infective therapies as requested by physician.   Complete by: As directed    [facility] to provide Cath Flo 40m    Complete by: As directed    Administer for PICC line occlusion and as ordered by physician for other access device issues.   Anaphylaxis Kit: Provided to treat any anaphylactic reaction to the medication being provided to the patient if First Dose or when requested by physician   Complete by: As directed    Epinephrine 148mml vial / amp: Administer 0.66m24m0.66ml37mubcutaneously once for moderate to severe anaphylaxis, nurse to call physician and pharmacy when reaction occurs and call 911 if needed for immediate care   Diphenhydramine 50mg78mIV vial: Administer 25-50mg 366mM PRN for first dose reaction, rash, itching, mild reaction, nurse to call physician and pharmacy when reaction occurs   Sodium Chloride 0.9% NS 500ml I65mdminister if needed for hypovolemic blood pressure drop or as ordered by physician after call to physician with anaphylactic reaction   Change dressing on IV access line weekly and PRN   Complete by: As directed    Flush IV access with Sodium Chloride 0.9% and Heparin 10 units/ml or 100 units/ml   Complete by: As directed    Home infusion instructions - [facility]   Complete by: As directed    Instructions: Flush IV access with Sodium Chloride 0.9% and Heparin 10units/ml or 100units/ml   Change dressing on IV access line: Weekly and PRN   Instructions Cath Flo 2mg: Ad35mister for PICC Line occlusion and as ordered by physician for other access device   [facility] pharmacist to adjust dose for: Vancomycin, Aminoglycosides and other anti-infective therapies as requested by physician   Method of administration may be changed at the discretion of home infusion pharmacist based upon assessment of the patient and/or caregiver's ability to self-administer the medication ordered   Complete by: As directed       Allergies as of 12/27/2020       Reactions   Metformin And Related Other (See Comments)   Abdominal pain and diarrhea        Medication List      STOP taking these medications    aspirin 325 MG EC tablet   cephALEXin 500 MG capsule Commonly known as: KEFLEX   ibuprofen 800 MG tablet Commonly known as: ADVIL   valACYclovir 1000 MG tablet Commonly known as: VALTREX       TAKE these medications    acyclovir 1,000 mg in dextrose 5 % 150 mL Inject 1,000 mg into the vein every 8 (eight) hours for 13 days. 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   cholecalciferol 25 MCG (1000 UNIT) tablet Commonly known as: VITAMIN D Take 1,000 Units by mouth daily.   clopidogrel 75 MG tablet Commonly known as:  PLAVIX Take 1 tablet (75 mg total) by mouth daily.   Farxiga 10 MG Tabs tablet Generic drug: dapagliflozin propanediol TAKE 1 TABLET BY MOUTH DAILY BEFORE BREAKFAST.   gabapentin 100 MG capsule Commonly known as: NEURONTIN Take 1 capsule (100 mg total) by mouth 3 (three) times daily.   HYDROcodone-acetaminophen 5-325 MG tablet Commonly known as: Norco Take 1 tablet by mouth every 4 (four) hours as needed for moderate pain.   lisinopril 40 MG tablet Commonly known as: ZESTRIL Take 1 tablet (40 mg total) by mouth daily.   metoprolol succinate 100 MG 24 hr tablet Commonly known as: TOPROL-XL Take 1 tablet (100 mg total) by mouth daily.   ONE TOUCH ULTRA MINI w/Device Kit USE AS DIRECTED   ONE TOUCH ULTRA TEST test strip Generic drug: glucose blood USE AS DIRECTED   OneTouch Delica Lancets Fine Misc See admin instructions.   predniSONE 10 MG tablet Commonly known as: DELTASONE Take 60 mg daily until 10/4, then 50 mg x 1 Hull, 40 mg  x 1 Hull, 30 mg x 1 Hull, 20 mg x 1 Hull, 10 mg x 1 Hull, 5 mg x 2 Hull   rosuvastatin 20 MG tablet Commonly known as: CRESTOR TAKE 1 TABLET BY MOUTH EVERY Hull   traMADol 50 MG tablet Commonly known as: ULTRAM TAKE 1 TABLET BY MOUTH TWICE A Hull AS NEEDED   Vitamin D (Ergocalciferol) 1.25 MG (50000 UNIT) Caps capsule Commonly known as: DRISDOL Take 1 capsule (50,000 Units total) by mouth every 7 (seven) days.               Discharge Care Instructions  (From admission, onward)           Start     Ordered   12/27/20 0000  Change dressing on IV access line weekly and PRN  (Home infusion instructions - [facility] )        12/27/20 0740            Allergies  Allergen Reactions   Metformin And Related Other (See Comments)    Abdominal pain and diarrhea      CT ANGIO HEAD NECK W WO CM  Result Date: 12/25/2020 CLINICAL DATA:  Facial droop.  History of Bell's palsy. EXAM: CT ANGIOGRAPHY HEAD AND NECK TECHNIQUE: Multidetector CT imaging of the head and neck was performed using the standard protocol during bolus administration of intravenous contrast. Multiplanar CT image reconstructions and MIPs were obtained to evaluate the vascular anatomy. Carotid stenosis measurements (when applicable) are obtained utilizing NASCET criteria, using the distal internal carotid diameter as the denominator. CONTRAST:  38m OMNIPAQUE IOHEXOL 350 MG/ML SOLN COMPARISON:  Head CT 12/25/2020 FINDINGS: CTA NECK FINDINGS SKELETON: There is no bony spinal canal stenosis. No lytic or blastic lesion. C5-7 ACDF. OTHER NECK: Normal pharynx, larynx and major salivary glands. No cervical lymphadenopathy. Unremarkable thyroid gland. UPPER CHEST: No pneumothorax or pleural effusion. No nodules or masses. AORTIC ARCH: There is calcific atherosclerosis of the aortic arch. There is no aneurysm, dissection or hemodynamically significant stenosis of the visualized portion of the aorta. Conventional 3 vessel  aortic branching pattern. The visualized proximal subclavian arteries are widely patent. RIGHT CAROTID SYSTEM: No dissection, occlusion or aneurysm. Mild atherosclerotic calcification at the carotid bifurcation without hemodynamically significant stenosis. LEFT CAROTID SYSTEM: No dissection, occlusion or aneurysm. Mild atherosclerotic calcification at the carotid bifurcation without hemodynamically significant stenosis. VERTEBRAL ARTERIES: Left dominant configuration. Both origins are clearly patent. There is no dissection, occlusion  or flow-limiting stenosis to the skull base (V1-V3 segments). CTA HEAD FINDINGS POSTERIOR CIRCULATION: --Vertebral arteries: Normal left V4 segment. Right V4 segment appears to terminate in PICA. --Inferior cerebellar arteries: Normal. --Basilar artery: Normal. --Superior cerebellar arteries: Normal. --Posterior cerebral arteries (PCA): Normal. ANTERIOR CIRCULATION: --Intracranial internal carotid arteries: Normal. --Anterior cerebral arteries (ACA): Normal. Both A1 segments are present. Patent anterior communicating artery (a-comm). --Middle cerebral arteries (MCA): Normal. VENOUS SINUSES: As permitted by contrast timing, patent. ANATOMIC VARIANTS: None Review of the MIP images confirms the above findings. IMPRESSION: 1. No emergent large vessel occlusion or high-grade stenosis of the head or neck. 2. Mild bilateral carotid bifurcation atherosclerosis without hemodynamically significant stenosis by NASCET criteria. Aortic Atherosclerosis (ICD10-I70.0). Electronically Signed   By: Ulyses Jarred M.D.   On: 12/25/2020 21:50   CT HEAD WO CONTRAST (5MM)  Result Date: 12/25/2020 CLINICAL DATA:  Pt reports that yesterday he got up and brushed his teeth states that it felt funny did not think anything of it. He then was eating lunch and his drink dripped down his face. He has a history of Bells Palsy EXAM: CT HEAD WITHOUT CONTRAST TECHNIQUE: Contiguous axial images were obtained from the  base of the skull through the vertex without intravenous contrast. COMPARISON:  CT head 02/27/2004 FINDINGS: Brain: No evidence of large-territorial acute infarction. No parenchymal hemorrhage. No mass lesion. No extra-axial collection. No mass effect or midline shift. No hydrocephalus. Basilar cisterns are patent. Vascular: No hyperdense vessel. Skull: No acute fracture or focal lesion. Sinuses/Orbits: Paranasal sinuses and mastoid air cells are clear. The orbits are unremarkable. Other: None. IMPRESSION: No acute intracranial abnormality. Electronically Signed   By: Iven Finn M.D.   On: 12/25/2020 18:00   MR BRAIN W WO CONTRAST  Result Date: 12/25/2020 CLINICAL DATA:  Neuro deficit, acute, stroke suspected Bell's palsy vs. Stroke, thin cuts through brainstem to eval for CN VII enhancement and potential XI/X involvement EXAM: MRI HEAD WITHOUT AND WITH CONTRAST TECHNIQUE: Multiplanar, multiecho pulse sequences of the brain and surrounding structures were obtained without and with intravenous contrast. CONTRAST:  64m GADAVIST GADOBUTROL 1 MMOL/ML IV SOLN COMPARISON:  Brain MRI 02/28/2004 FINDINGS: Brain: No acute infarct, mass effect or extra-axial collection. No acute or chronic hemorrhage. There is multifocal hyperintense T2-weighted signal within the white matter. Parenchymal volume and CSF spaces are normal. The midline structures are normal. INTERNAL AUDITORY CANALS: There is no cerebellopontine angle mass. The cochleae and semicircular canals are normal. No focal abnormality along the course of the 7th and 8th cranial nerves. There is normal, symmetric contrast enhancement of the intratemporal segments of the facial nerves. Normal porus acusticus and vestibular aqueduct bilaterally. Vascular: Major flow voids are preserved. Skull and upper cervical spine: Normal calvarium and skull base. Visualized upper cervical spine and soft tissues are normal. Sinuses/Orbits:No paranasal sinus fluid levels or  advanced mucosal thickening. No mastoid or middle ear effusion. Normal orbits. IMPRESSION: 1. Normal MRI of the internal auditory canals and inner ear structures. 2. No acute intracranial abnormality. 3. Findings of mild chronic microvascular ischemia. Electronically Signed   By: KUlyses JarredM.D.   On: 12/25/2020 21:29   ECHOCARDIOGRAM COMPLETE  Result Date: 12/26/2020    ECHOCARDIOGRAM REPORT   Patient Name:   RCANDY ZIEGLERDate of Exam: 12/26/2020 Medical Rec #:  [id number]      Height:       72.0 in Accession #:    [id number]     Weight:  290.0 lb Date of Birth:  1961/02/13        BSA:          2.495 m Patient Age:    83 years        BP:           144/81 mmHg Patient Gender: M               HR:           49 bpm. Exam Location:  [facility] Procedure: 2D Echo, Cardiac Doppler and Color Doppler Indications:     TIA G45.9  History:         Patient has no prior history of Echocardiogram examinations.                  CAD and Previous Myocardial Infarction; Risk                  Factors:Hypertension.  Sonographer:     Sherrie Sport Referring Phys:  [id number] JAN A MANSY Diagnosing Phys: Harrell Gave End MD  Sonographer Comments: Technically challenging study due to limited acoustic windows, no apical window and no subcostal window. IMPRESSIONS  1. Left ventricular ejection fraction, by estimation, is >55%. The left ventricle has normal function. Left ventricular endocardial border not optimally defined to evaluate regional wall motion. There is moderate left ventricular hypertrophy. Left ventricular diastolic function could not be evaluated.  2. Right ventricular systolic function was not well visualized. The right ventricular size is not well visualized.  3. The mitral valve was not well visualized. No evidence of mitral valve regurgitation.  4. The aortic valve has an indeterminant number of cusps. There is mild calcification of the aortic valve. There is moderate thickening of the aortic valve. Aortic valve  regurgitation not well assessed. FINDINGS  Left Ventricle: Left ventricular ejection fraction, by estimation, is >55%. The left ventricle has normal function. Left ventricular endocardial border not optimally defined to evaluate regional wall motion. The left ventricular internal cavity size was  normal in size. There is moderate left ventricular hypertrophy. Left ventricular diastolic function could not be evaluated. Right Ventricle: The right ventricular size is not well visualized. No increase in right ventricular wall thickness. Right ventricular systolic function was not well visualized. Left Atrium: Left atrial size was not well visualized. Right Atrium: Right atrial size was not well visualized. Pericardium: The pericardium was not well visualized. Mitral Valve: The mitral valve was not well visualized. No evidence of mitral valve regurgitation. Tricuspid Valve: The tricuspid valve is not well visualized. Tricuspid valve regurgitation is trivial. Aortic Valve: The aortic valve has an indeterminant number of cusps. There is mild calcification of the aortic valve. There is moderate thickening of the aortic valve. Aortic valve regurgitation not well assessed. Pulmonic Valve: The pulmonic valve was not well visualized. Pulmonic valve regurgitation is not visualized. No evidence of pulmonic stenosis. Aorta: The aortic root is normal in size and structure. Pulmonary Artery: The pulmonary artery is not well seen. IAS/Shunts: The interatrial septum was not well visualized.  LEFT VENTRICLE PLAX 2D LVIDd:         4.10 cm LVIDs:         2.70 cm LV PW:         1.40 cm LV IVS:        1.10 cm LVOT diam:     2.20 cm LVOT Area:     3.80 cm  LEFT ATRIUM  Index LA diam:    5.30 cm 2.12 cm/m                        PULMONIC VALVE AORTA                 PV Vmax:        0.90 m/s Ao Root diam: 3.30 cm PV Peak grad:   3.2 mmHg                       RVOT Peak grad: 4 mmHg   SHUNTS Systemic Diam: 2.20 cm Nelva Bush MD  Electronically signed by Nelva Bush MD Signature Date/Time: 12/26/2020/4:09:32 PM    Final    [place] EKG SITE RITE  Result Date: 12/27/2020 If Site Rite image not attached, placement could not be confirmed due to current cardiac rhythm.  DG FL GUIDED LUMBAR PUNCTURE  Result Date: 12/26/2020 CLINICAL DATA:  Encephalitis, request received for diagnostic lumbar puncture. EXAM: DIAGNOSTIC LUMBAR PUNCTURE UNDER FLUOROSCOPIC GUIDANCE COMPARISON:  MRI brain imaging performed 12/25/2020 and CT head imaging performed 12/25/2020 was reviewed prior to the procedure. FLUOROSCOPY TIME:  Fluoroscopy Time:  0.2 minute Radiation Exposure Index (if provided by the fluoroscopic device): 8.4 mGy Number of Acquired Spot Images: 2 PROCEDURE: Informed consent was obtained from the patient prior to the procedure, including potential complications of headache, infection, bleeding, CSF leak requiring additional procedures, nerve damage, allergy, and pain. With the patient prone, the lower back was prepped with Betadine. 1% Lidocaine was used for local anesthesia. Lumbar puncture was performed at the L3-L4 level using a 20 gauge needle with return of clear colorless CSF with an opening pressure of 22 cm water and closing pressure of 17 cm of water. 8 ml of CSF were obtained for laboratory studies. The patient tolerated the procedure well and there were no apparent complications. A sterile bandage was applied. IMPRESSION: Technically successful fluoroscopic guided lumbar puncture. Read By: Tsosie Billing PA-C Electronically Signed   By: Kathreen Devoid M.D.   On: 12/26/2020 09:55     The results of significant diagnostics from this hospitalization (including imaging, microbiology, ancillary and laboratory) are listed below for reference.     Microbiology: Recent Results (from the past 240 hour(s))  Resp Panel by RT-PCR (Flu A&B, Covid) Nasopharyngeal Swab     Status: None   Collection Time: 12/25/20  7:21 PM   Specimen:  Nasopharyngeal Swab; Nasopharyngeal(NP) swabs in vial transport medium  Result Value Ref Range Status   SARS Coronavirus 2 by RT PCR NEGATIVE NEGATIVE Final    Comment: (NOTE) SARS-CoV-2 target nucleic acids are NOT DETECTED.  The SARS-CoV-2 RNA is generally detectable in upper respiratory specimens during the acute phase of infection. The lowest concentration of SARS-CoV-2 viral copies this assay can detect is 138 copies/mL. A negative result does not preclude SARS-Cov-2 infection and should not be used as the sole basis for treatment or other patient management decisions. A negative result may occur with  improper specimen collection/handling, submission of specimen other than nasopharyngeal swab, presence of viral mutation(s) within the areas targeted by this assay, and inadequate number of viral copies(<138 copies/mL). A negative result must be combined with clinical observations, patient history, and epidemiological information. The expected result is Negative.  Fact Sheet for Patients:  EntrepreneurPulse.com.au  Fact Sheet for Healthcare Providers:  IncredibleEmployment.be  This test is no t yet approved or cleared by the [place] and  has been authorized for detection and/or diagnosis of SARS-CoV-2 by FDA under an Emergency Use Authorization (EUA). This EUA will remain  in effect (meaning this test can be used) for the duration of the COVID-19 declaration under Section 564(b)(1) of the Act, 21 U.S.C.section 360bbb-3(b)(1), unless the authorization is terminated  or revoked sooner.       Influenza A by PCR NEGATIVE NEGATIVE Final   Influenza B by PCR NEGATIVE NEGATIVE Final    Comment: (NOTE) The Xpert Xpress SARS-CoV-2/FLU/RSV plus assay is intended as an aid in the diagnosis of influenza from Nasopharyngeal swab specimens and should not be used as a sole basis for treatment. Nasal washings and aspirates are unacceptable for  Xpert Xpress SARS-CoV-2/FLU/RSV testing.  Fact Sheet for Patients: EntrepreneurPulse.com.au  Fact Sheet for Healthcare Providers: IncredibleEmployment.be  This test is not yet approved or cleared by the [place] FDA and has been authorized for detection and/or diagnosis of SARS-CoV-2 by FDA under an Emergency Use Authorization (EUA). This EUA will remain in effect (meaning this test can be used) for the duration of the COVID-19 declaration under Section 564(b)(1) of the Act, 21 U.S.C. section 360bbb-3(b)(1), unless the authorization is terminated or revoked.  Performed at [facility], [address] [place], [place] [address]   CSF culture w Gram Stain     Status: None (Preliminary result)   Collection Time: 12/26/20 10:00 AM   Specimen: PATH Cytology CSF; Cerebrospinal Fluid  Result Value Ref Range Status   Specimen Description CSF  Final   Special Requests NONE  Final   Gram Stain   Final    CYTOSPIN SLIDE NO WBC SEEN NO RBC SEEN NO ORGANISMS SEEN Performed at [facility], [address] [place],  [address]    Culture PENDING  Incomplete   Report Status PENDING  Incomplete     Labs: BNP (last 3 results) No results for input(s): BNP in the last 8760 hours. Basic Metabolic Panel: Recent Labs  Lab 12/25/20 1519 12/26/20 0734 12/27/20 0353  NA 136 138 138  K 4.3 4.1 4.2  CL 102 101 102  CO2 26 27 29   GLUCOSE 147* 126* 136*  BUN 20 16 14   CREATININE 1.01 0.85 0.90  CALCIUM 9.1 9.0 9.1  MG  --   --  2.1   Liver Function Tests: Recent Labs  Lab 12/25/20 1519  AST 26  ALT 24  ALKPHOS 65  BILITOT 0.9  PROT 7.0  ALBUMIN 4.2   No results for input(s): LIPASE, AMYLASE in the last 168 hours. No results for input(s): AMMONIA in the last 168 hours. CBC: Recent Labs  Lab 12/25/20 1519 12/26/20 0734 12/27/20 0353  WBC 8.3 7.0 7.3  NEUTROABS 5.2  --   --   HGB 14.3 13.9 14.6  HCT 41.1  40.1 44.4  MCV 88.2 87.6 88.3  PLT 264 232 273   Cardiac Enzymes: No results for input(s): CKTOTAL, CKMB, CKMBINDEX, TROPONINI in the last 168 hours. BNP: Invalid input(s): POCBNP CBG: Recent Labs  Lab 12/26/20 0944 12/26/20 1311 12/26/20 1534 12/26/20 2035 12/27/20 0734  GLUCAP 127* 200* 184* 176* 159*   D-Dimer No results for input(s): DDIMER in the last 72 hours. Hgb A1c Recent Labs    12/26/20 0734  HGBA1C 7.9*   Lipid Profile Recent Labs    12/26/20 0734  CHOL 136  HDL 27*  LDLCALC 51  TRIG 291*  CHOLHDL 5.0   Thyroid function studies No results for input(s): TSH, T4TOTAL,  T3FREE, THYROIDAB in the last 72 hours.  Invalid input(s): FREET3 Anemia work up No results for input(s): VITAMINB12, FOLATE, FERRITIN, TIBC, IRON, RETICCTPCT in the last 72 hours. Urinalysis    Component Value Date/Time   COLORURINE STRAW (A) 12/26/2020 2129   APPEARANCEUR CLEAR (A) 12/26/2020 2129   LABSPEC 1.013 12/26/2020 2129   PHURINE 6.0 12/26/2020 2129   GLUCOSEU >=500 (A) 12/26/2020 2129   HGBUR NEGATIVE 12/26/2020 2129   BILIRUBINUR NEGATIVE 12/26/2020 2129   BILIRUBINUR Negative 02/02/2018 [place] 12/26/2020 2129   PROTEINUR NEGATIVE 12/26/2020 2129   UROBILINOGEN 0.2 02/02/2018 1447   UROBILINOGEN 0.2 09/22/2013 0938   NITRITE NEGATIVE 12/26/2020 2129   LEUKOCYTESUR NEGATIVE 12/26/2020 2129   Sepsis Labs Invalid input(s): PROCALCITONIN,  WBC,  LACTICIDVEN Microbiology Recent Results (from the past 240 hour(s))  Resp Panel by RT-PCR (Flu A&B, Covid) Nasopharyngeal Swab     Status: None   Collection Time: 12/25/20  7:21 PM   Specimen: Nasopharyngeal Swab; Nasopharyngeal(NP) swabs in vial transport medium  Result Value Ref Range Status   SARS Coronavirus 2 by RT PCR NEGATIVE NEGATIVE Final    Comment: (NOTE) SARS-CoV-2 target nucleic acids are NOT DETECTED.  The SARS-CoV-2 RNA is generally detectable in upper respiratory specimens during the  acute phase of infection. The lowest concentration of SARS-CoV-2 viral copies this assay can detect is 138 copies/mL. A negative result does not preclude SARS-Cov-2 infection and should not be used as the sole basis for treatment or other patient management decisions. A negative result may occur with  improper specimen collection/handling, submission of specimen other than nasopharyngeal swab, presence of viral mutation(s) within the areas targeted by this assay, and inadequate number of viral copies(<138 copies/mL). A negative result must be combined with clinical observations, patient history, and epidemiological information. The expected result is Negative.  Fact Sheet for Patients:  EntrepreneurPulse.com.au  Fact Sheet for Healthcare Providers:  IncredibleEmployment.be  This test is no t yet approved or cleared by the [place] FDA and  has been authorized for detection and/or diagnosis of SARS-CoV-2 by FDA under an Emergency Use Authorization (EUA). This EUA will remain  in effect (meaning this test can be used) for the duration of the COVID-19 declaration under Section 564(b)(1) of the Act, 21 U.S.C.section 360bbb-3(b)(1), unless the authorization is terminated  or revoked sooner.       Influenza A by PCR NEGATIVE NEGATIVE Final   Influenza B by PCR NEGATIVE NEGATIVE Final    Comment: (NOTE) The Xpert Xpress SARS-CoV-2/FLU/RSV plus assay is intended as an aid in the diagnosis of influenza from Nasopharyngeal swab specimens and should not be used as a sole basis for treatment. Nasal washings and aspirates are unacceptable for Xpert Xpress SARS-CoV-2/FLU/RSV testing.  Fact Sheet for Patients: EntrepreneurPulse.com.au  Fact Sheet for Healthcare Providers: IncredibleEmployment.be  This test is not yet approved or cleared by the [place] FDA and has been authorized for detection and/or  diagnosis of SARS-CoV-2 by FDA under an Emergency Use Authorization (EUA). This EUA will remain in effect (meaning this test can be used) for the duration of the COVID-19 declaration under Section 564(b)(1) of the Act, 21 U.S.C. section 360bbb-3(b)(1), unless the authorization is terminated or revoked.  Performed at [facility], [address] [place], [place] [address]   CSF culture w Gram Stain     Status: None (Preliminary result)   Collection Time: 12/26/20 10:00 AM   Specimen: PATH Cytology CSF; Cerebrospinal Fluid  Result Value  Ref Range Status   Specimen Description CSF  Final   Special Requests NONE  Final   Gram Stain   Final    CYTOSPIN SLIDE NO WBC SEEN NO RBC SEEN NO ORGANISMS SEEN Performed at [facility], [address] [place], [place] [address]    Culture PENDING  Incomplete   Report Status PENDING  Incomplete     Time coordinating discharge in minutes: 65  SIGNED:   Debbe Odea, MD  [facility] 12/27/2020, 9:40 AM

## 2020-12-27 NOTE — Progress Notes (Signed)
Peripherally Inserted Central Catheter Placement  The IV Nurse has discussed with the patient and/or persons authorized to consent for the patient, the purpose of this procedure and the potential benefits and risks involved with this procedure.  The benefits include less needle sticks, lab draws from the catheter, and the patient may be discharged home with the catheter. Risks include, but not limited to, infection, bleeding, blood clot (thrombus formation), and puncture of an artery; nerve damage and irregular heartbeat and possibility to perform a PICC exchange if needed/ordered by physician.  Alternatives to this procedure were also discussed.  Bard Power PICC patient education guide, fact sheet on infection prevention and patient information card has been provided to patient /or left at bedside.    PICC Placement Documentation  PICC Single Lumen 60/47/99 Right Basilic 40 cm 0 cm (Active)  Indication for Insertion or Continuance of Line Prolonged intravenous therapies 12/27/20 0956  Exposed Catheter (cm) 0 cm 12/27/20 0956  Site Assessment Clean;Dry;Intact 12/27/20 0956  Line Status Flushed;Saline locked;Blood return noted 12/27/20 0956  Dressing Type Transparent 12/27/20 0956  Dressing Status Clean;Dry;Intact 12/27/20 0956  Antimicrobial disc in place? Yes 12/27/20 0956  Line Adjustment (NICU/IV Team Only) No 12/27/20 0956  Dressing Intervention New dressing;Other (Comment) 12/27/20 0956  Dressing Change Due 01/03/21 12/27/20 0956       Nathan Hull 12/27/2020, 9:57 AM

## 2020-12-27 NOTE — TOC Transition Note (Signed)
Transition of Care [facility]) - CM/SW Discharge Note   Patient Details  Name: Nathan Hull MRN: [id number] Date of Birth: 11/25/1960  [facility]) CM/SW Contact:  Candie Chroman, LCSW Phone Number: 12/27/2020, 1:50 PM   Clinical Narrative:   Patient has orders to discharge home today. Carolynn Sayers, RN with [facility] will be here between 2:30-3:00 for education and said son will be here as well. No further concerns. CSW signing off.  Final next level of care: [place] Barriers to Discharge: No Barriers Identified   Patient Goals and CMS Choice     Choice offered to / list presented to : Patient  Discharge Placement                    Patient and family notified of of transfer: 12/27/20  Discharge Plan and Services     Post Acute Care Choice: [place] Arranged: RN, IV Antibiotics [place] Agency: Other - See comment ([facility] and [facility]) Date HH Agency Contacted: 12/27/20   Representative spoke with at [place]: [employer] Interventions     Readmission Risk Interventions No flowsheet data found.

## 2020-12-27 NOTE — Progress Notes (Signed)
Inpatient Diabetes Program Recommendations  AACE/ADA: New Consensus Statement on Inpatient Glycemic Control   Target Ranges:  Prepandial:   less than 140 mg/dL      Peak postprandial:   less than 180 mg/dL (1-2 hours)      Critically ill patients:  140 - 180 mg/dL  Results for Nathan Hull, Nathan Hull (MRN [id number]) as of 12/27/2020 09:23  Ref. Range 12/27/2020 03:53  Glucose Latest Ref Range: 70 - 99 mg/dL 136 (H)   Results for Nathan Hull, Nathan Hull (MRN [id number]) as of 12/27/2020 09:23  Ref. Range 12/26/2020 09:44 12/26/2020 13:11 12/26/2020 15:34 12/26/2020 20:35  Glucose-Capillary Latest Ref Range: 70 - 99 mg/dL 127 (H) 200 (H) 184 (H) 176 (H)    Review of Glycemic Control  Diabetes history: DM2 Outpatient Diabetes medications: Farxiga 10 mg daily Current orders for Inpatient glycemic control: Farxiga 10 mg daily, Novolog 0-9 units AC&HS  Inpatient Diabetes Program Recommendations:    Diet: Please consider discontinuing Regular diet and ordering Carb Modified diet.  Thanks, Barnie Alderman, RN, MSN, CDE Diabetes Coordinator Inpatient Diabetes Program [phone] (Team Pager from 8am to 5pm)

## 2020-12-27 NOTE — TOC Initial Note (Addendum)
Transition of Care [facility]) - Initial/Assessment Note    Patient Details  Name: Nathan Hull MRN: [id number] Date of Birth: 1960-12-27  Transition of Care [facility]) CM/SW Contact:    Nathan Chroman, LCSW Phone Number: 12/27/2020, 10:12 AM  Clinical Narrative:   CSW spoke with patient, introduced role, and explained that discharge planning would be discussed. Patient will discharge on IV Acyclovir until 10/11. Referral made to [facility] and they arranged for a nurse through [facility]. Patient is aware and agreeable. He is a patient at [facility]. His initial provider retired and then the next one quit. He is currently assigned Nathan Joe, FNP but has not seen her yet. [facility] representative is aware. Patient asked about paperwork for short-term disability. Advised that he should reach out to his PCP office for this. [facility] representative is not sure they have Acyclovir in stock. They may have to order to get it here tomorrow. She will notify CSW once she has an update. No further concerns. CSW encouraged patient to contact CSW as needed. CSW will continue to follow patient for support and facilitate return home when everything is arranged.        10:54 am: Per [facility] representative, they are all set for discharge today and have drug in stock for the entire therapy. Sent secure chat to MD to notify.         Expected Discharge Plan: [place] Barriers to Discharge: No Barriers Identified   Patient Goals and CMS Choice     Choice offered to / list presented to : Patient  Expected Discharge Plan and Services Expected Discharge Plan: [place] Acute Care Choice: [place] arrangements for the past 2 months: Single Family Home                           HH Arranged: RN, IV Antibiotics HH Agency: Other - See comment ([facility] and [facility]) Date HH Agency Contacted:  12/27/20   Representative spoke with at [place]: Carolynn Sayers  Prior Living Arrangements/Services Living arrangements for the past 2 months: [place] Lives with:: Self Patient language and need for interpreter reviewed:: Yes Do you feel safe going back to the place where you live?: Yes      Need for Family Participation in Patient Care: Yes (Comment)     Criminal Activity/Legal Involvement Pertinent to Current Situation/Hospitalization: No - Comment as needed  Activities of Daily Living Home Assistive Devices/Equipment: None ADL Screening (condition at time of admission) Patient's cognitive ability adequate to safely complete daily activities?: Yes Is the patient deaf or have difficulty hearing?: No Does the patient have difficulty seeing, even when wearing glasses/contacts?: No Does the patient have difficulty concentrating, remembering, or making decisions?: No Patient able to express need for assistance with ADLs?: Yes Does the patient have difficulty dressing or bathing?: No Independently performs ADLs?: Yes (appropriate for developmental age) Does the patient have difficulty walking or climbing stairs?: No Weakness of Legs: None Weakness of Arms/Hands: None  Permission Sought/Granted                  Emotional Assessment Appearance:: Appears stated age Attitude/Demeanor/Rapport: Engaged, Gracious Affect (typically observed): Accepting, Appropriate, Calm, Pleasant Orientation: : Oriented to Self, Oriented to Place, Oriented to  Time, Oriented to Situation Alcohol / Substance Use: Not Applicable Psych Involvement: No (  comment)  Admission diagnosis:  Facial weakness [R29.810] TIA (transient ischemic attack) [G45.9] Encephalitis [G04.90] 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 [I69.992] Herpes zoster [B02.9] Patient Active Problem List   Diagnosis Date Noted   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   Herpes zoster 12/26/2020   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 [place] [address] Phone: [phone] Fax: [phone]     Social Determinants of Health (SDOH) Interventions    Readmission Risk Interventions No flowsheet data found.

## 2020-12-28 LAB — HSV 1/2 PCR, CSF
HSV-1 DNA: NEGATIVE
HSV-2 DNA: NEGATIVE

## 2020-12-28 LAB — VZV PCR, CSF: VZV PCR, CSF: NEGATIVE

## 2020-12-29 LAB — CSF CULTURE W GRAM STAIN: Culture: NO GROWTH

## 2020-12-29 NOTE — Telephone Encounter (Signed)
Contacted patient back he states that he never contacted our office about shingles. KW

## 2021-01-05 ENCOUNTER — Other Ambulatory Visit: Payer: Self-pay | Admitting: Family Medicine

## 2021-01-05 DIAGNOSIS — Z981 Arthrodesis status: Secondary | ICD-10-CM

## 2021-01-05 DIAGNOSIS — M542 Cervicalgia: Secondary | ICD-10-CM

## 2021-01-05 NOTE — Telephone Encounter (Signed)
Requested medications are due for refill today yes  Requested medications are on the active medication list yes  Last refill 12/08/20  Last visit 01/2020 for neck pain  Future visit scheduled 01/10/21  Notes to clinic Not Delegated.

## 2021-01-09 LAB — MISC LABCORP TEST (SEND OUT): Labcorp test code: 9985

## 2021-01-09 NOTE — Telephone Encounter (Signed)
Pt called stating that he is completely out and is requesting to have this sent in today is possible. Please advise.

## 2021-01-10 ENCOUNTER — Encounter: Payer: Self-pay | Admitting: Family Medicine

## 2021-01-10 ENCOUNTER — Telehealth: Payer: Self-pay

## 2021-01-10 ENCOUNTER — Other Ambulatory Visit: Payer: Self-pay

## 2021-01-10 ENCOUNTER — Ambulatory Visit: Payer: 59 | Admitting: Family Medicine

## 2021-01-10 VITALS — BP 154/78 | HR 67 | Temp 98.2°F | Ht 72.0 in | Wt 284.6 lb

## 2021-01-10 DIAGNOSIS — Z0289 Encounter for other administrative examinations: Secondary | ICD-10-CM | POA: Insufficient documentation

## 2021-01-10 DIAGNOSIS — M542 Cervicalgia: Secondary | ICD-10-CM | POA: Insufficient documentation

## 2021-01-10 DIAGNOSIS — Z09 Encounter for follow-up examination after completed treatment for conditions other than malignant neoplasm: Secondary | ICD-10-CM | POA: Diagnosis not present

## 2021-01-10 DIAGNOSIS — Z125 Encounter for screening for malignant neoplasm of prostate: Secondary | ICD-10-CM | POA: Insufficient documentation

## 2021-01-10 DIAGNOSIS — Z981 Arthrodesis status: Secondary | ICD-10-CM | POA: Diagnosis not present

## 2021-01-10 DIAGNOSIS — I10 Essential (primary) hypertension: Secondary | ICD-10-CM

## 2021-01-10 MED ORDER — TRAMADOL HCL 50 MG PO TABS: 50.0000 mg | ORAL_TABLET | Freq: Two times a day (BID) | ORAL | 0 refills | Status: DC

## 2021-01-10 NOTE — Assessment & Plan Note (Signed)
Pulled for medication refill 

## 2021-01-10 NOTE — Assessment & Plan Note (Signed)
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

## 2021-01-10 NOTE — Telephone Encounter (Signed)
Copied from [place] [id number]. Topic: General - Other >> Jan 09, 2021  1:50 PM [place] wrote: Reason for DHW:[id number] called in wanted to know if his IV will be rmoved tomorrow 10/12 at his appt. Please call back

## 2021-01-10 NOTE — Assessment & Plan Note (Signed)
Chronic pain; Pulled for medication refill

## 2021-01-10 NOTE — Assessment & Plan Note (Signed)
FMLA/Return to work forms

## 2021-01-10 NOTE — Assessment & Plan Note (Signed)
Slight improvement since change in ACEi; has next f/u in 3 months Denies CP, SOB, DOE No LE edema

## 2021-01-10 NOTE — Progress Notes (Signed)
Established patient visit   Patient: Nathan Hull   DOB: 1960-04-14   60 y.o. Male  MRN: [id number] Visit Date: 01/10/2021  Today's healthcare provider: Gwyneth Sprout, FNP   Chief Complaint  Patient presents with   Follow-up   Subjective    HPI  Follow up Hospitalization  Patient was admitted to [facility] on 12/25/2020 and discharged on 12/27/2020. He was treated for right sided facial droop. Treatment for this included completed treatment for herpes zoster, 14-day course of IV acyclovir in addition to a prolonged prednisone taper to treat (probable) involvement of cranial nerves VII, IX and X related to herpes zoster, received a PICC line and was discharged home with home health  Telephone follow up was done on n/a He reports excellent compliance with treatment. He reports this condition is resolved.  -----------------------------------------------------------------------------------------   Medications: Outpatient Medications Prior to Visit  Medication Sig   Blood Glucose Monitoring Suppl (ONE TOUCH ULTRA MINI) w/Device KIT USE AS DIRECTED   cholecalciferol (VITAMIN D) 25 MCG (1000 UNIT) tablet Take 1,000 Units by mouth daily.   clopidogrel (PLAVIX) 75 MG tablet Take 1 tablet (75 mg total) by mouth daily.   FARXIGA 10 MG TABS tablet TAKE 1 TABLET BY MOUTH DAILY BEFORE BREAKFAST.   lisinopril (ZESTRIL) 40 MG tablet Take 1 tablet (40 mg total) by mouth daily.   metoprolol succinate (TOPROL-XL) 100 MG 24 hr tablet Take 1 tablet (100 mg total) by mouth daily.   ONE TOUCH ULTRA TEST test strip USE AS DIRECTED   ONETOUCH DELICA LANCETS FINE MISC See admin instructions.   rosuvastatin (CRESTOR) 20 MG tablet TAKE 1 TABLET BY MOUTH EVERY DAY   [DISCONTINUED] traMADol (ULTRAM) 50 MG tablet TAKE 1 TABLET BY MOUTH TWICE A DAY AS NEEDED   [DISCONTINUED] gabapentin (NEURONTIN) 100 MG capsule Take 1 capsule (100 mg total) by mouth 3 (three) times daily. (Patient not taking: Reported on  01/10/2021)   [DISCONTINUED] HYDROcodone-acetaminophen (NORCO) 5-325 MG tablet Take 1 tablet by mouth every 4 (four) hours as needed for moderate pain. (Patient not taking: Reported on 01/10/2021)   [DISCONTINUED] predniSONE (DELTASONE) 10 MG tablet Take 60 mg daily until 10/4, then 50 mg x 1 day, 40 mg x 1 day, 30 mg x 1 day, 20 mg x 1 day, 10 mg x 1 day, 5 mg x 2 day (Patient not taking: Reported on 01/10/2021)   [DISCONTINUED] Vitamin D, Ergocalciferol, (DRISDOL) 1.25 MG (50000 UNIT) CAPS capsule Take 1 capsule (50,000 Units total) by mouth every 7 (seven) days. (Patient not taking: No sig reported)   No facility-administered medications prior to visit.    Review of Systems  Last CBC Lab Results  Component Value Date   WBC 7.3 12/27/2020   HGB 14.6 12/27/2020   HCT 44.4 12/27/2020   MCV 88.3 12/27/2020   MCH 29.0 12/27/2020   RDW 12.9 12/27/2020   PLT 273 82/42/3536   Last metabolic panel Lab Results  Component Value Date   GLUCOSE 136 (H) 12/27/2020   NA 138 12/27/2020   K 4.2 12/27/2020   CL 102 12/27/2020   CO2 29 12/27/2020   BUN 14 12/27/2020   CREATININE 0.90 12/27/2020   EGFR 92 07/05/2020   GFRNONAA >60 12/27/2020   CALCIUM 9.1 12/27/2020   PROT 7.0 12/25/2020   ALBUMIN 4.1 12/26/2020   LABGLOB 2.7 07/05/2020   AGRATIO 1.6 07/05/2020   BILITOT 0.9 12/25/2020   ALKPHOS 65 12/25/2020   AST 26 12/25/2020  ALT 24 12/25/2020   ANIONGAP 7 12/27/2020   Last lipids Lab Results  Component Value Date   CHOL 136 12/26/2020   HDL 27 (L) 12/26/2020   LDLCALC 51 12/26/2020   TRIG 291 (H) 12/26/2020   CHOLHDL 5.0 12/26/2020   Last hemoglobin A1c Lab Results  Component Value Date   HGBA1C 7.9 (H) 12/26/2020   Last thyroid functions Lab Results  Component Value Date   TSH 1.190 02/04/2020   Last vitamin D Lab Results  Component Value Date   VD25OH 17.4 (L) 07/05/2020   Last vitamin B12 and Folate No results found for: VITAMINB12, FOLATE     Objective     BP (!) 154/78 (BP Location: Left Arm, Patient Position: Sitting, Cuff Size: Large)   Pulse 67   Temp 98.2 F (36.8 C) (Oral)   Ht 6' (1.829 m)   Wt 284 lb 9.6 oz (129.1 kg)   BMI 38.60 kg/m  BP Readings from Last 3 Encounters:  01/10/21 (!) 154/78  12/27/20 (!) 141/82  12/12/20 (!) 166/93   Wt Readings from Last 3 Encounters:  01/10/21 284 lb 9.6 oz (129.1 kg)  12/26/20 287 lb 7.7 oz (130.4 kg)  12/12/20 291 lb (132 kg)      Physical Exam Vitals and nursing note reviewed.  Constitutional:      Appearance: Normal appearance. He is obese.  HENT:     Head: Normocephalic and atraumatic.  Eyes:     Pupils: Pupils are equal, round, and reactive to light.  Neck:     Comments: Report of chronic neck pain r/t fusion Cardiovascular:     Rate and Rhythm: Normal rate and regular rhythm.     Pulses: Normal pulses.     Heart sounds: Normal heart sounds.  Pulmonary:     Effort: Pulmonary effort is normal.     Breath sounds: Normal breath sounds.  Abdominal:     Comments: Adiposity   Musculoskeletal:        General: Normal range of motion.     Cervical back: Normal range of motion. Tenderness present.  Skin:    General: Skin is warm and dry.     Capillary Refill: Capillary refill takes less than 2 seconds.  Neurological:     General: No focal deficit present.     Mental Status: He is alert and oriented to person, place, and time. Mental status is at baseline.  Psychiatric:        Mood and Affect: Mood normal.        Behavior: Behavior normal.        Thought Content: Thought content normal.        Judgment: Judgment normal.     No results found for any visits on 01/10/21.  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      Vonna Kotyk, FNP, have reviewed all documentation for this visit. The documentation on 01/10/21 for the exam, diagnosis, procedures, and orders are all accurate and complete.    Gwyneth Sprout, [place] [phone] (phone) [phone] (fax)  [place]

## 2021-01-12 ENCOUNTER — Telehealth: Payer: Self-pay | Admitting: Family Medicine

## 2021-01-12 DIAGNOSIS — E782 Mixed hyperlipidemia: Secondary | ICD-10-CM

## 2021-01-12 MED ORDER — ROSUVASTATIN CALCIUM 20 MG PO TABS: 20.0000 mg | ORAL_TABLET | Freq: Every day | ORAL | 1 refills | Status: DC

## 2021-01-12 NOTE — Telephone Encounter (Signed)
[facility] for the following medications:  rosuvastatin (CRESTOR) 20 MG tablet  Last Rx: 03/08/20 LOV: 01/10/21 Please advise. Thanks TNP

## 2021-02-07 ENCOUNTER — Other Ambulatory Visit: Payer: Self-pay | Admitting: Family Medicine

## 2021-02-07 DIAGNOSIS — M542 Cervicalgia: Secondary | ICD-10-CM

## 2021-02-07 DIAGNOSIS — Z981 Arthrodesis status: Secondary | ICD-10-CM

## 2021-02-27 ENCOUNTER — Other Ambulatory Visit: Payer: Self-pay | Admitting: Family Medicine

## 2021-02-27 DIAGNOSIS — E119 Type 2 diabetes mellitus without complications: Secondary | ICD-10-CM

## 2021-03-07 ENCOUNTER — Other Ambulatory Visit: Payer: Self-pay | Admitting: Family Medicine

## 2021-03-07 DIAGNOSIS — M542 Cervicalgia: Secondary | ICD-10-CM

## 2021-03-07 DIAGNOSIS — Z981 Arthrodesis status: Secondary | ICD-10-CM

## 2021-03-07 NOTE — Telephone Encounter (Signed)
Please review. Last office visit 01/10/2021.  KP

## 2021-04-11 ENCOUNTER — Other Ambulatory Visit: Payer: Self-pay | Admitting: Family Medicine

## 2021-04-11 DIAGNOSIS — Z981 Arthrodesis status: Secondary | ICD-10-CM

## 2021-04-11 DIAGNOSIS — M542 Cervicalgia: Secondary | ICD-10-CM

## 2021-04-12 ENCOUNTER — Other Ambulatory Visit: Payer: Self-pay

## 2021-04-12 ENCOUNTER — Ambulatory Visit: Payer: 59 | Admitting: Family Medicine

## 2021-04-12 ENCOUNTER — Encounter: Payer: Self-pay | Admitting: Family Medicine

## 2021-04-12 VITALS — BP 122/77 | HR 54 | Resp 16 | Wt 290.9 lb

## 2021-04-12 DIAGNOSIS — Z981 Arthrodesis status: Secondary | ICD-10-CM

## 2021-04-12 DIAGNOSIS — E1159 Type 2 diabetes mellitus with other circulatory complications: Secondary | ICD-10-CM

## 2021-04-12 DIAGNOSIS — E559 Vitamin D deficiency, unspecified: Secondary | ICD-10-CM | POA: Insufficient documentation

## 2021-04-12 DIAGNOSIS — B351 Tinea unguium: Secondary | ICD-10-CM | POA: Insufficient documentation

## 2021-04-12 DIAGNOSIS — I152 Hypertension secondary to endocrine disorders: Secondary | ICD-10-CM

## 2021-04-12 DIAGNOSIS — M542 Cervicalgia: Secondary | ICD-10-CM | POA: Diagnosis not present

## 2021-04-12 DIAGNOSIS — L602 Onychogryphosis: Secondary | ICD-10-CM | POA: Insufficient documentation

## 2021-04-12 DIAGNOSIS — E1149 Type 2 diabetes mellitus with other diabetic neurological complication: Secondary | ICD-10-CM

## 2021-04-12 LAB — POCT GLYCOSYLATED HEMOGLOBIN (HGB A1C): Hemoglobin A1C: 8.5 % — AB (ref 4.0–5.6)

## 2021-04-12 MED ORDER — TRAMADOL HCL 50 MG PO TABS: 50.0000 mg | ORAL_TABLET | Freq: Two times a day (BID) | ORAL | 5 refills | Status: DC

## 2021-04-12 MED ORDER — VITAMIN D (ERGOCALCIFEROL) 1.25 MG (50000 UNIT) PO CAPS: 50000.0000 [IU] | ORAL_CAPSULE | ORAL | 3 refills | Status: DC

## 2021-04-12 MED ORDER — METFORMIN HCL ER 750 MG PO TB24: 750.0000 mg | ORAL_TABLET | Freq: Every day | ORAL | 1 refills | Status: DC

## 2021-04-12 NOTE — Assessment & Plan Note (Signed)
Seen on annual foot exam; ref to pod

## 2021-04-12 NOTE — Assessment & Plan Note (Signed)
Chronic stable Requests refill of chronic pain medication PDMP reviewed; no concern for abuse

## 2021-04-12 NOTE — Assessment & Plan Note (Signed)
BP stable Continue recommendation for goal <130/<80

## 2021-04-12 NOTE — Assessment & Plan Note (Signed)
Chronic, stable Refill of pain medication

## 2021-04-12 NOTE — Progress Notes (Signed)
Established patient visit   Patient: Nathan Hull   DOB: [birth year]   61 y.o. Male  MRN: [id number] Visit Date: 04/12/2021  Today's healthcare provider: Gwyneth Sprout, FNP   Chief Complaint  Patient presents with   Diabetes   Hypertension   Hyperlipidemia   Subjective    HPI  Diabetes Mellitus Type II, follow-up  Lab Results  Component Value Date   HGBA1C 7.9 (H) 12/26/2020   HGBA1C 7.6 (A) 10/10/2020   HGBA1C 7.6 (A) 06/06/2020   Last seen for diabetes 8 months ago.  Management since then includes continuing the same treatment. He reports excellent compliance with treatment. He is not having side effects.   Home blood sugar records:  not checked  Episodes of hypoglycemia? No    Current insulin regiment: none Most Recent Eye Exam: >12 months  --------------------------------------------------------------------------------------------------- Hypertension, follow-up  BP Readings from Last 3 Encounters:  04/12/21 122/77  01/10/21 (!) 154/78  12/27/20 (!) 141/82   Wt Readings from Last 3 Encounters:  04/12/21 290 lb 14.4 oz (132 kg)  01/10/21 284 lb 9.6 oz (129.1 kg)  12/26/20 287 lb 7.7 oz (130.4 kg)     He was last seen for hypertension 3 months ago.  BP at that visit was 154/78. Management since that visit includes none. He reports excellent compliance with treatment. He is not having side effects.  He is not exercising. He  is not  adherent to low salt diet.   Outside blood pressures are not checked.  He does not smoke.  Use of agents associated with hypertension: none.   --------------------------------------------------------------------------------------------------- Lipid/Cholesterol, follow-up  Last Lipid Panel: Lab Results  Component Value Date   CHOL 136 12/26/2020   LDLCALC 51 12/26/2020   HDL 27 (L) 12/26/2020   TRIG 291 (H) 12/26/2020    He was last seen for this 8 months ago.  Management since that visit includes  none.  He reports excellent compliance with treatment. He is not having side effects.   Symptoms: No appetite changes No foot ulcerations  No chest pain No chest pressure/discomfort  No dyspnea No orthopnea  No fatigue No lower extremity edema  No palpitations No paroxysmal nocturnal dyspnea  No nausea No numbness or tingling of extremity  Yes polydipsia Yes polyuria  No speech difficulty No syncope   He is following a Regular diet. Current exercise: no regular exercise  Last metabolic panel Lab Results  Component Value Date   GLUCOSE 136 (H) 12/27/2020   NA 138 12/27/2020   K 4.2 12/27/2020   BUN 14 12/27/2020   CREATININE 0.90 12/27/2020   EGFR 92 07/05/2020   GFRNONAA >60 12/27/2020   CALCIUM 9.1 12/27/2020   AST 26 12/25/2020   ALT 24 12/25/2020   The 10-year ASCVD risk score (Arnett DK, et al., 2019) is: 15.9%  ---------------------------------------------------------------------------------------------------   Medications: Outpatient Medications Prior to Visit  Medication Sig   Blood Glucose Monitoring Suppl (ONE TOUCH ULTRA MINI) w/Device KIT USE AS DIRECTED   clopidogrel (PLAVIX) 75 MG tablet Take 1 tablet (75 mg total) by mouth daily.   dapagliflozin propanediol (FARXIGA) 10 MG TABS tablet Take 1 tablet (10 mg total) by mouth daily.   lisinopril (ZESTRIL) 40 MG tablet Take 1 tablet (40 mg total) by mouth daily.   metoprolol succinate (TOPROL-XL) 100 MG 24 hr tablet Take 1 tablet (100 mg total) by mouth daily.   ONE TOUCH ULTRA TEST test strip USE AS DIRECTED   ONETOUCH  DELICA LANCETS FINE MISC See admin instructions.   rosuvastatin (CRESTOR) 20 MG tablet Take 1 tablet (20 mg total) by mouth daily.   [DISCONTINUED] traMADol (ULTRAM) 50 MG tablet TAKE 1 TABLET BY MOUTH TWICE A DAY   [DISCONTINUED] cholecalciferol (VITAMIN D) 25 MCG (1000 UNIT) tablet Take 1,000 Units by mouth daily. (Patient not taking: Reported on 04/12/2021)   No facility-administered  medications prior to visit.    Review of Systems      Objective    BP 122/77    Pulse (!) 54    Resp 16    Wt 290 lb 14.4 oz (132 kg)    SpO2 97%    BMI 39.45 kg/m     Physical Exam Vitals and nursing note reviewed.  Constitutional:      Appearance: Normal appearance. He is obese.  HENT:     Head: Normocephalic and atraumatic.  Eyes:     Pupils: Pupils are equal, round, and reactive to light.  Neck:     Comments: Chronic pain limited ROM Cardiovascular:     Rate and Rhythm: Normal rate and regular rhythm.     Pulses: Normal pulses.     Heart sounds: Normal heart sounds.  Pulmonary:     Effort: Pulmonary effort is normal.     Breath sounds: Normal breath sounds.  Musculoskeletal:        General: Normal range of motion.  Skin:    General: Skin is warm and dry.     Capillary Refill: Capillary refill takes less than 2 seconds.  Neurological:     General: No focal deficit present.     Mental Status: He is alert and oriented to person, place, and time. Mental status is at baseline.  Psychiatric:        Mood and Affect: Mood normal.        Behavior: Behavior normal.        Thought Content: Thought content normal.        Judgment: Judgment normal.     No results found for any visits on 04/12/21.  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 Negative foot exam for neuropathy Increase seen in A1c Refused referral to diabetic resource center Printed information provided      Relevant Medications   metFORMIN (GLUCOPHAGE XR) 750 MG 24 hr tablet   Other Relevant Orders   POCT glycosylated  hemoglobin (Hb A1C)     Musculoskeletal and Integument   Nail fungus    Seen on annual foot exam; ref to pod      Relevant Orders   Ambulatory referral to Podiatry     Other   S/P cervical spinal fusion    Chronic, stable Refill of pain medication      Relevant Medications   traMADol (ULTRAM) 50 MG tablet   Neck pain    Chronic stable Requests refill of chronic pain medication PDMP reviewed; no concern for abuse       Relevant Medications   traMADol (ULTRAM) 50 MG tablet   Thickened nails  Seen on bilateral feet on annual foot exam Referral to podiatry No pain       Relevant Orders   Ambulatory referral to Podiatry   Vitamin D deficiency    Chronic concern; unstable- out of Rx medication  Request for medication refills for weekly medication      Relevant Medications   Vitamin D, Ergocalciferol, (DRISDOL) 1.25 MG (50000 UNIT) CAPS capsule     Return in about 3 months (around 07/11/2021).      Vonna Kotyk, FNP, have reviewed all documentation for this visit. The documentation on 04/12/21 for the exam, diagnosis, procedures, and orders are all accurate and complete.  Patient seen and examined by Tally Joe,  FNP note scribed by [employer], [place], [place] [phone] (phone) [phone] (fax)  [place]

## 2021-04-12 NOTE — Assessment & Plan Note (Signed)
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

## 2021-04-12 NOTE — Assessment & Plan Note (Signed)
Chronic concern; unstable- out of Rx medication  Request for medication refills for weekly medication

## 2021-04-12 NOTE — Assessment & Plan Note (Signed)
Seen on bilateral feet on annual foot exam Referral to podiatry No pain

## 2021-05-03 ENCOUNTER — Other Ambulatory Visit: Payer: Self-pay | Admitting: Family Medicine

## 2021-05-03 DIAGNOSIS — I10 Essential (primary) hypertension: Secondary | ICD-10-CM

## 2021-05-10 ENCOUNTER — Other Ambulatory Visit: Payer: Self-pay | Admitting: Family Medicine

## 2021-05-10 DIAGNOSIS — M542 Cervicalgia: Secondary | ICD-10-CM

## 2021-05-10 DIAGNOSIS — Z981 Arthrodesis status: Secondary | ICD-10-CM

## 2021-05-10 NOTE — Telephone Encounter (Signed)
Requested medication (s) are due for refill today - no  Requested medication (s) are on the active medication list -yes  Future visit scheduled -yes  Last refill: 04/12/21 #60 5RF  Notes to clinic: Request RF: non delegated Rx  Requested Prescriptions  Pending Prescriptions Disp Refills   traMADol (ULTRAM) 50 MG tablet [Pharmacy Med Name: TRAMADOL HCL 50 MG TABLET] 60 tablet 0    Sig: TAKE 1 TABLET BY MOUTH TWICE A DAY     Not Delegated - Analgesics:  Opioid Agonists Failed - 05/10/2021 11:57 AM      Failed - This refill cannot be delegated      Passed - Urine Drug Screen completed in last 360 days      Passed - Valid encounter within last 3 months    Recent Outpatient Visits           4 weeks ago Hypertension associated with diabetes [facility])   [facility] Gwyneth Sprout, FNP   4 months ago Encounter for completion of form with patient   [facility] Tally Joe T, FNP   4 months ago Herpes zoster with ophthalmic complication, unspecified herpes zoster eye disease   [place] Jerrol Banana., MD   7 months ago Diabetes mellitus without complication [facility])   [facility] Jerrol Banana., MD   10 months ago Benign essential HTN   [place] Just, Laurita Quint, FNP       Future Appointments             In 2 months Gwyneth Sprout, FNP [facility] [place]               Requested Prescriptions  Pending Prescriptions Disp Refills   traMADol (ULTRAM) 50 MG tablet [Pharmacy Med Name: TRAMADOL HCL 50 MG TABLET] 60 tablet 0    Sig: TAKE 1 TABLET BY MOUTH TWICE A DAY     Not Delegated - Analgesics:  Opioid Agonists Failed - 05/10/2021 11:57 AM      Failed - This refill cannot be delegated      Passed - Urine Drug Screen completed in last 360 days      Passed - Valid encounter within last 3 months    Recent Outpatient Visits           4 weeks ago Hypertension associated with  diabetes [facility])   [facility] Gwyneth Sprout, FNP   4 months ago Encounter for completion of form with patient   [facility] Tally Joe T, FNP   4 months ago Herpes zoster with ophthalmic complication, unspecified herpes zoster eye disease   [facility] Jerrol Banana., MD   7 months ago Diabetes mellitus without complication [facility])   [facility] Jerrol Banana., MD   10 months ago Benign essential HTN   [place] Just, Laurita Quint, FNP       Future Appointments             In 2 months Gwyneth Sprout, [place], [place]

## 2021-05-27 ENCOUNTER — Other Ambulatory Visit: Payer: Self-pay | Admitting: Family Medicine

## 2021-05-27 DIAGNOSIS — E119 Type 2 diabetes mellitus without complications: Secondary | ICD-10-CM

## 2021-05-28 ENCOUNTER — Other Ambulatory Visit: Payer: Self-pay | Admitting: Family Medicine

## 2021-05-28 DIAGNOSIS — I10 Essential (primary) hypertension: Secondary | ICD-10-CM

## 2021-05-29 NOTE — Telephone Encounter (Signed)
Requested Prescriptions  Pending Prescriptions Disp Refills   lisinopril (ZESTRIL) 40 MG tablet [Pharmacy Med Name: LISINOPRIL 40 MG TABLET] 90 tablet 1    Sig: TAKE 1 TABLET BY MOUTH EVERY DAY     Cardiovascular:  ACE Inhibitors Passed - 05/28/2021  1:41 AM      Passed - Cr in normal range and within 180 days    Creatinine, Ser  Date Value Ref Range Status  12/27/2020 0.90 0.61 - 1.24 mg/dL Final         Passed - K in normal range and within 180 days    Potassium  Date Value Ref Range Status  12/27/2020 4.2 3.5 - 5.1 mmol/L Final         Passed - Patient is not pregnant      Passed - Last BP in normal range    BP Readings from Last 1 Encounters:  04/12/21 122/77         Passed - Valid encounter within last 6 months    Recent Outpatient Visits          1 month ago Hypertension associated with diabetes [facility])   [facility] Gwyneth Sprout, FNP   4 months ago Encounter for completion of form with patient   [facility] Tally Joe T, FNP   5 months ago Herpes zoster with ophthalmic complication, unspecified herpes zoster eye disease   [facility] Jerrol Banana., MD   7 months ago Diabetes mellitus without complication [facility])   [facility] Jerrol Banana., MD   10 months ago Benign essential HTN   [place] Just, Laurita Quint, FNP      Future Appointments            In 1 month Gwyneth Sprout, [place], [place]

## 2021-06-07 ENCOUNTER — Other Ambulatory Visit: Payer: Self-pay | Admitting: Family Medicine

## 2021-06-07 DIAGNOSIS — E119 Type 2 diabetes mellitus without complications: Secondary | ICD-10-CM

## 2021-06-07 DIAGNOSIS — E782 Mixed hyperlipidemia: Secondary | ICD-10-CM

## 2021-06-07 DIAGNOSIS — I25810 Atherosclerosis of coronary artery bypass graft(s) without angina pectoris: Secondary | ICD-10-CM

## 2021-06-07 NOTE — Telephone Encounter (Signed)
Requested Prescriptions  ?Pending Prescriptions Disp Refills  ?? clopidogrel (PLAVIX) 75 MG tablet [Pharmacy Med Name: CLOPIDOGREL 75 MG TABLET] 90 tablet 1  ?  Sig: TAKE 1 TABLET BY MOUTH EVERY DAY  ?  ? Hematology: Antiplatelets - clopidogrel Passed - 06/07/2021  3:32 PM  ?  ?  Passed - HCT in normal range and within 180 days  ?  HCT  ?Date Value Ref Range Status  ?12/27/2020 44.4 39.0 - 52.0 % Final  ? ?Hematocrit  ?Date Value Ref Range Status  ?02/04/2020 43.5 37.5 - 51.0 % Final  ?   ?  ?  Passed - HGB in normal range and within 180 days  ?  Hemoglobin  ?Date Value Ref Range Status  ?12/27/2020 14.6 13.0 - 17.0 g/dL Final  ?02/04/2020 14.6 13.0 - 17.7 g/dL Final  ?   ?  ?  Passed - PLT in normal range and within 180 days  ?  Platelets  ?Date Value Ref Range Status  ?12/27/2020 273 150 - 400 K/uL Final  ?02/04/2020 234 150 - 450 x10E3/uL Final  ?   ?  ?  Passed - Cr in normal range and within 360 days  ?  Creatinine, Ser  ?Date Value Ref Range Status  ?12/27/2020 0.90 0.61 - 1.24 mg/dL Final  ?   ?  ?  Passed - Valid encounter within last 6 months  ?  Recent Outpatient Visits   ?      ? 1 month ago Hypertension associated with diabetes ([place])  ? [facility] Tally Joe T, FNP  ? 4 months ago Encounter for completion of form with patient  ? [facility] Gwyneth Sprout, FNP  ? 5 months ago Herpes zoster with ophthalmic complication, unspecified herpes zoster eye disease  ? [facility] Jerrol Banana., MD  ? 8 months ago Diabetes mellitus without complication [facility])  ? [facility] Jerrol Banana., MD  ? 11 months ago Benign essential HTN  ? [employer] Just, Laurita Quint, FNP  ?  ?  ?Future Appointments   ?        ? In 1 month Gwyneth Sprout, [place], [facility]  ?  ? ?  ?  ?  ? ? ?

## 2021-06-07 NOTE — Telephone Encounter (Signed)
Requested Prescriptions  ?Pending Prescriptions Disp Refills  ?? FARXIGA 10 MG TABS tablet [Pharmacy Med Name: FARXIGA 10 MG TABLET] 90 tablet 0  ?  Sig: TAKE 1 TABLET BY MOUTH EVERY DAY  ?  ? Endocrinology:  Diabetes - SGLT2 Inhibitors Failed - 06/07/2021  3:32 PM  ?  ?  Failed - HBA1C is between 0 and 7.9 and within 180 days  ?  Hemoglobin A1C  ?Date Value Ref Range Status  ?04/12/2021 8.5 (A) 4.0 - 5.6 % Final  ? ?Hgb A1c MFr Bld  ?Date Value Ref Range Status  ?12/26/2020 7.9 (H) 4.8 - 5.6 % Final  ?  Comment:  ?  (NOTE) ?        Prediabetes: 5.7 - 6.4 ?        Diabetes: >6.4 ?        Glycemic control for adults with diabetes: <7.0 ?  ?   ?  ?  Passed - Cr in normal range and within 360 days  ?  Creatinine, Ser  ?Date Value Ref Range Status  ?12/27/2020 0.90 0.61 - 1.24 mg/dL Final  ?   ?  ?  Passed - eGFR in normal range and within 360 days  ?  GFR calc Af Amer  ?Date Value Ref Range Status  ?02/04/2020 110 >59 mL/min/1.73 Final  ?  Comment:  ?  **In accordance with recommendations from the NKF-ASN Task force,** ?  [facility] is in the process of updating its eGFR calculation to the ?  2021 CKD-EPI creatinine equation that estimates kidney function ?  without a race variable. ?  ? ?GFR, Estimated  ?Date Value Ref Range Status  ?12/27/2020 >60 >60 mL/min Final  ?  Comment:  ?  (NOTE) ?Calculated using the CKD-EPI Creatinine Equation (2021) ?  ? ?eGFR  ?Date Value Ref Range Status  ?07/05/2020 92 >59 mL/min/1.73 Final  ?   ?  ?  Passed - Valid encounter within last 6 months  ?  Recent Outpatient Visits   ?      ? 1 month ago Hypertension associated with diabetes ([place])  ? [facility] Tally Joe T, FNP  ? 4 months ago Encounter for completion of form with patient  ? [facility] Gwyneth Sprout, FNP  ? 5 months ago Herpes zoster with ophthalmic complication, unspecified herpes zoster eye disease  ? [facility] Jerrol Banana., MD  ? 8 months ago Diabetes mellitus  without complication [facility])  ? [facility] Jerrol Banana., MD  ? 11 months ago Benign essential HTN  ? [employer] Just, Laurita Quint, FNP  ?  ?  ?Future Appointments   ?        ? In 1 month Gwyneth Sprout, [place], [facility]  ?  ? ?  ?  ?  ?? rosuvastatin (CRESTOR) 20 MG tablet [Pharmacy Med Name: ROSUVASTATIN CALCIUM 20 MG TAB] 90 tablet 1  ?  Sig: TAKE 1 TABLET BY MOUTH EVERY DAY  ?  ? Cardiovascular:  Antilipid - Statins 2 Failed - 06/07/2021  3:32 PM  ?  ?  Failed - Lipid Panel in normal range within the last 12 months  ?  Cholesterol, Total  ?Date Value Ref Range Status  ?02/04/2020 130 100 - 199 mg/dL Final  ? ?Cholesterol  ?Date Value Ref Range Status  ?12/26/2020 136 0 - 200 mg/dL Final  ? ?LDL Chol Calc (NIH)  ?Date Value  Ref Range Status  ?02/04/2020 65 0 - 99 mg/dL Final  ? ?LDL Cholesterol  ?Date Value Ref Range Status  ?12/26/2020 51 0 - 99 mg/dL Final  ?  Comment:  ?         ?Total Cholesterol/HDL:CHD Risk ?Coronary Heart Disease Risk Table ?                    Men   Women ? 1/2 Average Risk   3.4   3.3 ? Average Risk       5.0   4.4 ? 2 X Average Risk   9.6   7.1 ? 3 X Average Risk  23.4   11.0 ?       ?Use the calculated Patient Ratio ?above and the CHD Risk Table ?to determine the patient's CHD Risk. ?       ?ATP III CLASSIFICATION (LDL): ? <100     mg/dL   Optimal ? 100-129  mg/dL   Near or Above ?                   Optimal ? 130-159  mg/dL   Borderline ? 160-189  mg/dL   High ? >190     mg/dL   Very High ?Performed at [facility], [address] [place], [place] [address] ?  ? ?HDL  ?Date Value Ref Range Status  ?12/26/2020 27 (L) >40 mg/dL Final  ?02/04/2020 30 (L) >39 mg/dL Final  ? ?Triglycerides  ?Date Value Ref Range Status  ?12/26/2020 291 (H) <150 mg/dL Final  ? ?  ?  ?  Passed - Cr in normal range and within 360 days  ?  Creatinine, Ser  ?Date Value Ref Range Status  ?12/27/2020 0.90 0.61 - 1.24 mg/dL Final  ?   ?  ?  Passed -  Patient is not pregnant  ?  ?  Passed - Valid encounter within last 12 months  ?  Recent Outpatient Visits   ?      ? 1 month ago Hypertension associated with diabetes ([place])  ? [facility] Tally Joe T, FNP  ? 4 months ago Encounter for completion of form with patient  ? [facility] Gwyneth Sprout, FNP  ? 5 months ago Herpes zoster with ophthalmic complication, unspecified herpes zoster eye disease  ? [facility] Jerrol Banana., MD  ? 8 months ago Diabetes mellitus without complication [facility])  ? [facility] Jerrol Banana., MD  ? 11 months ago Benign essential HTN  ? [employer] Just, Laurita Quint, FNP  ?  ?  ?Future Appointments   ?        ? In 1 month Gwyneth Sprout, [place], [facility]  ?  ? ?  ?  ?  ? ? ?

## 2021-07-11 ENCOUNTER — Ambulatory Visit: Payer: 59 | Admitting: Family Medicine

## 2021-07-13 ENCOUNTER — Encounter: Payer: Self-pay | Admitting: Physician Assistant

## 2021-07-13 ENCOUNTER — Ambulatory Visit: Payer: 59 | Admitting: Physician Assistant

## 2021-07-13 ENCOUNTER — Telehealth: Payer: Self-pay | Admitting: Physician Assistant

## 2021-07-13 VITALS — BP 128/69 | HR 52 | Temp 97.9°F | Resp 16 | Wt 285.7 lb

## 2021-07-13 DIAGNOSIS — E782 Mixed hyperlipidemia: Secondary | ICD-10-CM

## 2021-07-13 DIAGNOSIS — E1149 Type 2 diabetes mellitus with other diabetic neurological complication: Secondary | ICD-10-CM

## 2021-07-13 DIAGNOSIS — I10 Essential (primary) hypertension: Secondary | ICD-10-CM

## 2021-07-13 DIAGNOSIS — E669 Obesity, unspecified: Secondary | ICD-10-CM

## 2021-07-13 NOTE — Progress Notes (Signed)
?  ? ? ?I,Anona Giovannini Robinson,acting as a Education administrator for [employer], PA-C.,have documented all relevant documentation on the behalf of Mardene Speak, PA-C,as directed by  [employer], PA-C while in the presence of [employer], PA-C. ? ?Established patient visit ? ? ?Patient: Nathan Hull   DOB: 05/01/1960   61 y.o. Male  MRN: [id number] ?Visit Date: 07/13/2021 ? ?Today's healthcare provider: Mardene Speak, PA-C  ? ?Chief Complaint  ?Patient presents with  ? Hypertension  ? Diabetes  ? Hyperlipidemia  ? ?Subjective  ?  ?HPI  ?Patient presents for 3 month follow up. ?Hypertension, follow-up ? ?BP Readings from Last 3 Encounters:  ?07/13/21 128/69  ?04/12/21 122/77  ?01/10/21 (!) 154/78  ? Wt Readings from Last 3 Encounters:  ?07/13/21 285 lb 11.2 oz (129.6 kg)  ?04/12/21 290 lb 14.4 oz (132 kg)  ?01/10/21 284 lb 9.6 oz (129.1 kg)  ?  ? ?He was last seen for hypertension 3 months ago.  ?BP at that visit was 122/77 . Management since that visit includes no changes. ? ?He reports excellent compliance with treatment. ?He is not having side effects. ?He is following a  low sugar  diet. ?He is exercising. ?He does not smoke. ? ?Use of agents associated with hypertension: none.  ? ?Outside blood pressures are / does not take  ?Symptoms: ?No chest pain No chest pressure  ?No palpitations No syncope  ?No dyspnea No orthopnea  ?No paroxysmal nocturnal dyspnea No lower extremity edema  ? ?Pertinent labs ?Lab Results  ?Component Value Date  ? CHOL 136 12/26/2020  ? HDL 27 (L) 12/26/2020  ? [place] 51 12/26/2020  ? TRIG 291 (H) 12/26/2020  ? CHOLHDL 5.0 12/26/2020  ? Lab Results  ?Component Value Date  ? NA 138 12/27/2020  ? K 4.2 12/27/2020  ? CREATININE 0.90 12/27/2020  ? GFRNONAA >60 12/27/2020  ? GLUCOSE 136 (H) 12/27/2020  ? TSH 1.190 02/04/2020  ?  ? ?The 10-year ASCVD risk score (Arnett DK, et al., 2019) is: 18.6% ? ?---------------------------------------------------------------------------------------------------  ?Diabetes  [place] Type II, Follow-up ? ?Lab Results  ?Component Value Date  ? HGBA1C 8.5 (A) 04/12/2021  ? HGBA1C 7.9 (H) 12/26/2020  ? HGBA1C 7.6 (A) 10/10/2020  ? ?Wt Readings from Last 3 Encounters:  ?07/13/21 285 lb 11.2 oz (129.6 kg)  ?04/12/21 290 lb 14.4 oz (132 kg)  ?01/10/21 284 lb 9.6 oz (129.1 kg)  ? ?Last seen for diabetes 3 months ago.  ?Management since then includes metformin 750 mg daily. ? ?He reports excellent compliance with treatment. ?He is not having side effects.  ?Symptoms: ?No fatigue No foot ulcerations  ?No appetite changes No nausea  ?No paresthesia of the feet  No polydipsia  ?No polyuria No visual disturbances   ?No vomiting   ? ? ?Home blood sugar records: does not check  ? ?Episodes of hypoglycemia? No  ?  ?Current insulin regiment: no  ?Most Recent Eye Exam: due  ?Current exercise: yard work ?Current diet habits: in general, a "healthy" diet   ? ?Pertinent Labs: ?Lab Results  ?Component Value Date  ? CHOL 136 12/26/2020  ? HDL 27 (L) 12/26/2020  ? [place] 51 12/26/2020  ? TRIG 291 (H) 12/26/2020  ? CHOLHDL 5.0 12/26/2020  ? Lab Results  ?Component Value Date  ? NA 138 12/27/2020  ? K 4.2 12/27/2020  ? CREATININE 0.90 12/27/2020  ? GFRNONAA >60 12/27/2020  ?  ? ?---------------------------------------------------------------------------------------------------  ?Lipid/Cholesterol, Follow-up ? ?Last lipid panel Other pertinent labs  ?  Lab Results  ?Component Value Date  ? CHOL 136 12/26/2020  ? HDL 27 (L) 12/26/2020  ? [place] 51 12/26/2020  ? TRIG 291 (H) 12/26/2020  ? CHOLHDL 5.0 12/26/2020  ? Lab Results  ?Component Value Date  ? ALT 24 12/25/2020  ? AST 26 12/25/2020  ? PLT 273 12/27/2020  ? TSH 1.190 02/04/2020  ?  ? ?He was last seen for this 3 months ago.  ?Management since that visit includes no changes. ? ?He reports excellent compliance with treatment. ?He is not having side effects. ? ?Symptoms: ?No chest pain No chest pressure/discomfort  ?No dyspnea No lower extremity edema  ?No  numbness or tingling of extremity No orthopnea  ?No palpitations No paroxysmal nocturnal dyspnea  ?No speech difficulty No syncope  ? ? ?The 10-year ASCVD risk score (Arnett DK, et al., 2019) is: 18.6% ? ?---------------------------------------------------------------------------------------------------  ?Medications: ?Outpatient Medications Prior to Visit  ?Medication Sig  ? Blood Glucose Monitoring Suppl (ONE TOUCH ULTRA MINI) w/Device KIT USE AS DIRECTED  ? clopidogrel (PLAVIX) 75 MG tablet TAKE 1 TABLET BY MOUTH EVERY DAY  ? FARXIGA 10 MG TABS tablet TAKE 1 TABLET BY MOUTH EVERY DAY  ? lisinopril (ZESTRIL) 40 MG tablet TAKE 1 TABLET BY MOUTH EVERY DAY  ? metFORMIN (GLUCOPHAGE XR) 750 MG 24 hr tablet Take 1 tablet (750 mg total) by mouth daily with breakfast.  ? metoprolol succinate (TOPROL-XL) 100 MG 24 hr tablet TAKE 1 TABLET BY MOUTH EVERY DAY  ? ONE TOUCH ULTRA TEST test strip USE AS DIRECTED  ? ONETOUCH DELICA LANCETS FINE MISC See admin instructions.  ? rosuvastatin (CRESTOR) 20 MG tablet TAKE 1 TABLET BY MOUTH EVERY DAY  ? traMADol (ULTRAM) 50 MG tablet TAKE 1 TABLET BY MOUTH TWICE A DAY  ? Vitamin D, Ergocalciferol, (DRISDOL) 1.25 MG (50000 UNIT) CAPS capsule Take 1 capsule (50,000 Units total) by mouth every 7 (seven) days.  ? ?No facility-administered medications prior to visit.  ? ? ?Review of Systems  ?Respiratory: Negative.    ?Cardiovascular: Negative.   ?Gastrointestinal: Negative.   ?All other systems reviewed and are negative. ? ?  Objective  ?  ?BP 128/69 (BP Location: Right Arm, Patient Position: Sitting, Cuff Size: Large)   Pulse (!) 52   Temp 97.9 ?F (36.6 ?C) (Oral)   Resp 16   Wt 285 lb 11.2 oz (129.6 kg)   SpO2 97%   BMI 38.75 kg/m?  ? ? ?Physical Exam ?Vitals and nursing note reviewed.  ?Constitutional:   ?   Appearance: Normal appearance. He is obese.  ?HENT:  ?   Head: Atraumatic.  ?Cardiovascular:  ?   Rate and Rhythm: Normal rate and regular rhythm.  ?   Pulses: Normal pulses.   ?   Heart sounds: Normal heart sounds.  ?Pulmonary:  ?   Effort: Pulmonary effort is normal.  ?   Breath sounds: Normal breath sounds.  ?Abdominal:  ?   General: Bowel sounds are normal.  ?   Palpations: Abdomen is soft.  ?Neurological:  ?   General: No focal deficit present.  ?   Mental Status: He is alert and oriented to person, place, and time.  ?Psychiatric:     ?   Mood and Affect: Mood normal.     ?   Behavior: Behavior normal.     ?   Thought Content: Thought content normal.     ?   Judgment: Judgment normal.  ?  ? ? Assessment & Plan  ?  ? ?  1. Type 2 diabetes mellitus with neurological complications ([place]) ?Stable. Chronic ?Continue current medication-lisinopril and metformin ?- HgB A1c ? ?2. Mixed hyperlipidemia ?Stable. Chronic. ?Continue current medication-crestor ? ?3. Benign essential HTN ?Stable and chronic ?BP 128/69, at goal ?Continue current medication-lisinopril and metoprolol ? ?4. Obesity (BMI 30-39.9) ?BMI 38.75 ?Encouraged weight loss via diet and exercise routine. ? ?FU in 3 mo with Nathan Hull ?   ?The patient was advised to call back or seek an in-person evaluation if the symptoms worsen or if the condition fails to improve as anticipated. ? ?I discussed the assessment and treatment plan with the patient. The patient was provided an opportunity to ask questions and all were answered. The patient agreed with the plan and demonstrated an understanding of the instructions. ? ?The entirety of the information documented in the History of Present Illness, Review of Systems and Physical Exam were personally obtained by me. Portions of this information were initially documented by the CMA and reviewed by me for thoroughness and accuracy.   ? ? ?Mardene Speak, PA-C  ?[facility] ?[phone] (phone) ?[phone] (fax) ? ?[facility] ?

## 2021-07-14 LAB — HEMOGLOBIN A1C
Est. average glucose Bld gHb Est-mCnc: 166 mg/dL
Hgb A1c MFr Bld: 7.4 % — ABNORMAL HIGH (ref 4.8–5.6)

## 2021-08-02 ENCOUNTER — Other Ambulatory Visit: Payer: Self-pay | Admitting: Family Medicine

## 2021-08-02 DIAGNOSIS — Z981 Arthrodesis status: Secondary | ICD-10-CM

## 2021-08-02 DIAGNOSIS — M542 Cervicalgia: Secondary | ICD-10-CM

## 2021-08-02 NOTE — Telephone Encounter (Signed)
Requested medication (s) are due for refill today: Yes ? ?Requested medication (s) are on the active medication list: Yes ? ?Last refill:  05/12/21 ? ?Future visit scheduled: Yes ? ?Notes to clinic:  Not delegated.  ? ? ? ?Requested Prescriptions  ?Pending Prescriptions Disp Refills  ? traMADol (ULTRAM) 50 MG tablet [Pharmacy Med Name: TRAMADOL HCL 50 MG TABLET] 60 tablet 2  ?  Sig: TAKE 1 TABLET BY MOUTH TWICE A DAY  ?  ? Not Delegated - Analgesics:  Opioid Agonists Failed - 08/02/2021  7:28 AM  ?  ?  Failed - This refill cannot be delegated  ?  ?  Passed - Urine Drug Screen completed in last 360 days  ?  ?  Passed - Valid encounter within last 3 months  ?  Recent Outpatient Visits   ? ?      ? 2 weeks ago Type 2 diabetes mellitus with neurological complications ([place])  ? [facility] [place], [place], PA-C  ? 3 months ago Hypertension associated with diabetes [facility])  ? [facility] Tally Joe T, FNP  ? 6 months ago Encounter for completion of form with patient  ? [facility] Gwyneth Sprout, FNP  ? 7 months ago Herpes zoster with ophthalmic complication, unspecified herpes zoster eye disease  ? [facility] Jerrol Banana., MD  ? 9 months ago Diabetes mellitus without complication [facility])  ? [facility] Jerrol Banana., MD  ? ?  ?  ?Future Appointments   ? ?        ? In 2 months Gwyneth Sprout, [place], PEC  ? ?  ? ? ?  ?  ?  ? ?

## 2021-08-13 ENCOUNTER — Ambulatory Visit: Payer: Self-pay | Admitting: *Deleted

## 2021-08-13 NOTE — Telephone Encounter (Signed)
Summary: brown urine  ? Pt called in to be advised. Pt says that he has urinated twice this morning and it looks like coffee. Pt says that he hasn't done anything different. He is taking his medications as prescribed. Pt is unsure of if he need an appt or not?  ? ?Please assist pt further.   ?  ?Attempted to call patient x 2- no answer and VM not set up. Unable to leave call back message. ?

## 2021-08-13 NOTE — Progress Notes (Signed)
?  ? ? ?Established patient visit ? ? ?Patient: Nathan Hull   DOB: [birth year]   61 y.o. Male  MRN: [id number] ?Visit Date: 08/14/2021 ? ?Today's healthcare provider: Dani Gobble Skarlet Lyons, PA-C  ?Introduced myself to the patient as a Journalist, newspaper and provided education on APPs in clinical practice.  ? ? ?I,Tiffany J Bragg,acting as a Education administrator for [employer], PA-C.,have documented all relevant documentation on the behalf of Indie Nickerson E Natori Gudino, PA-C,as directed by  [employer], PA-C while in the presence of Paola Flynt E Gauge Winski, PA-C.  ? ?Chief Complaint  ?Patient presents with  ? Hematuria  ?  Patient states he has had coffee-colored urine starting yesterday morning. States he has been drinking plenty of water but the color has not changed. Does not complain of pain, discomfort, urgency or frequency.   ? ?Subjective  ?  ?Hematuria ?Pertinent negatives include no chills, dysuria, fever, flank pain, nausea or vomiting.  ?HPI   ? ? Hematuria   ? Additional comments: Patient states he has had coffee-colored urine starting yesterday morning. States he has been drinking plenty of water but the color has not changed. Does not complain of pain, discomfort, urgency or frequency.  ? ?  ?  ?Last edited by Smitty Knudsen, CMA on 08/14/2021  8:40 AM.  ?  ?  ? ?Yesterday he noticed his urine was very dark  ?States he tried drinking several bottles of water and then when he went back to the bathroom noted that his urine was almost the same color as cola ?States he kept drinking water and that the third time he went to the restroom his urine was lighter ? ?Reports this started around 8-9am and he drank 4 20 oz bottles of water and 2 gatorades to assist with hydration  ?Denies dysuria, new generalized body aches ? ? ? ? ?Medications: ?Outpatient Medications Prior to Visit  ?Medication Sig  ? Blood Glucose Monitoring Suppl (ONE TOUCH ULTRA MINI) w/Device KIT USE AS DIRECTED  ? clopidogrel (PLAVIX) 75 MG tablet TAKE 1 TABLET BY MOUTH EVERY DAY  ? FARXIGA 10 MG  TABS tablet TAKE 1 TABLET BY MOUTH EVERY DAY  ? lisinopril (ZESTRIL) 40 MG tablet TAKE 1 TABLET BY MOUTH EVERY DAY  ? metFORMIN (GLUCOPHAGE XR) 750 MG 24 hr tablet Take 1 tablet (750 mg total) by mouth daily with breakfast.  ? metoprolol succinate (TOPROL-XL) 100 MG 24 hr tablet TAKE 1 TABLET BY MOUTH EVERY DAY  ? ONE TOUCH ULTRA TEST test strip USE AS DIRECTED  ? ONETOUCH DELICA LANCETS FINE MISC See admin instructions.  ? rosuvastatin (CRESTOR) 20 MG tablet TAKE 1 TABLET BY MOUTH EVERY DAY  ? traMADol (ULTRAM) 50 MG tablet TAKE 1 TABLET BY MOUTH TWICE A DAY  ? Vitamin D, Ergocalciferol, (DRISDOL) 1.25 MG (50000 UNIT) CAPS capsule Take 1 capsule (50,000 Units total) by mouth every 7 (seven) days.  ? ?No facility-administered medications prior to visit.  ? ? ?Review of Systems  ?Constitutional:  Negative for chills, diaphoresis, fatigue and fever.  ?Eyes:  Negative for visual disturbance.  ?Respiratory:  Negative for shortness of breath and wheezing.   ?Cardiovascular:  Negative for chest pain.  ?Gastrointestinal:  Negative for diarrhea, nausea and vomiting.  ?Genitourinary:  Positive for hematuria. Negative for dysuria and flank pain.  ?Musculoskeletal:  Positive for back pain (Chronic) and neck pain (chronic).  ?Neurological:  Negative for dizziness, tremors, weakness, numbness and headaches.  ? ? ?  Objective  ?  ?  BP 129/66 (BP Location: Right Arm, Patient Position: Sitting, Cuff Size: Large)   Pulse 63   Temp 98.1 ?F (36.7 ?C) (Oral)   Resp 16   Ht 6' (1.829 m)   Wt 281 lb (127.5 kg)   SpO2 97%   BMI 38.11 kg/m?  ? ? ?Physical Exam ?Vitals reviewed.  ?Constitutional:   ?   General: He is awake.  ?   Appearance: Normal appearance. He is well-developed and well-groomed. He is obese.  ?HENT:  ?   Head: Normocephalic and atraumatic.  ?Eyes:  ?   General: Lids are normal.  ?   Conjunctiva/sclera: Conjunctivae normal.  ?Cardiovascular:  ?   Rate and Rhythm: Normal rate and regular rhythm.  ?   Pulses: Normal  pulses.  ?   Heart sounds: Normal heart sounds. No murmur heard. ?  No friction rub. No gallop.  ?Pulmonary:  ?   Effort: Pulmonary effort is normal.  ?   Breath sounds: Normal breath sounds and air entry. No decreased breath sounds, wheezing, rhonchi or rales.  ?Musculoskeletal:  ?   Right lower leg: No edema.  ?   Left lower leg: No edema.  ?Neurological:  ?   Mental Status: He is alert.  ?Psychiatric:     ?   Attention and Perception: Attention normal.     ?   Mood and Affect: Mood and affect normal.     ?   Speech: Speech normal.     ?   Behavior: Behavior normal. Behavior is cooperative.     ?   Cognition and Memory: Cognition normal.  ?  ? ? ?No results found for any visits on 08/14/21. ? Assessment & Plan  ?  ? ?Problem List Items Addressed This Visit   ?None ?Visit Diagnoses   ? ? Hematuria of unknown cause    -  Primary ?Acute, new problem  ?Reports cola colored urine 2x yesterday that has since seemingly resolved with copious amounts of water intake ?Will send for urine micro to examine for potential casts  ?Will order CBC, CMP, and CK to eval for rhabdo and check kidney function ?Recommend he continue to drink plenty of water to prevent dehydration  ?Results of lab work to dictate further management  ?Follow up as needed for persistent or progressing symptoms ?  ? Relevant Orders  ? POCT urinalysis dipstick (Completed)  ? CBC w/Diff/Platelet  ? Comprehensive Metabolic Panel (CMET)  ? Urinalysis, Routine w reflex microscopic  ? CK (Creatine Kinase)  ? ?  ? ? ? ?No follow-ups on file. ? ? ?I, Kierre Hintz E Lasonja Lakins, PA-C, have reviewed all documentation for this visit. The documentation on 08/14/21 for the exam, diagnosis, procedures, and orders are all accurate and complete. ? ? ?Esta Carmon, MHS, PA-C ?[facility] ?[facility]  ? ?

## 2021-08-13 NOTE — Telephone Encounter (Signed)
Chief Complaint: Coffee ground color with urination ?Symptoms: No symptoms, did see red in the bottom of toilet 1 hour ago after urination ?Frequency: Onset this morning twice seen coffee ground color when urinated ?Pertinent Negatives: Patient denies symptoms ?Disposition: '[]'$ ED /'[]'$ Urgent Care (no appt availability in office) / '[x]'$ Appointment(In office/virtual)/ '[]'$  [place] [facility]/ '[]'$ Home Care/ '[]'$ Refused Recommended Disposition /'[]'$ [place] [facility]/ '[]'$  Follow-up with PCP ?Additional Notes: Reported drinking so far today 4-5 20 oz water bottles, 2 5oz bottles of gatorade, 1 cup of coffee this morning. ? ? ? ? ?Summary: brown urine  ? Pt called in to be advised. Pt says that he has urinated twice this morning and it looks like coffee. Pt says that he hasn't done anything different. He is taking his medications as prescribed. Pt is unsure of if he need an appt or not?  ? ?Please assist pt further.   ?  ? ?Reason for Disposition ? Blood in urine  (Exception: could be normal menstrual bleeding) ? ?Answer Assessment - Initial Assessment Questions ?1. COLOR of URINE: "Describe the color of the urine."  (e.g., tea-colored, pink, red, blood clots, bloody) ?    Coffee colored this morning twice 1 hour ago and aroung 0930 ?2. ONSET: "When did the bleeding start?"  ?    Noticed it this morning ?3. EPISODES: "How many times has there been blood in the urine?" or "How many times today?" ?    2 times ?4. PAIN with URINATION: "Is there any pain with passing your urine?" If Yes, ask: "How bad is the pain?"  (Scale 1-10; or mild, moderate, severe) ?   - MILD - complains slightly about urination hurting ?   - MODERATE - interferes with normal activities   ?   - SEVERE - excruciating, unwilling or unable to urinate because of the pain  ?    No ?5. FEVER: "Do you have a fever?" If Yes, ask: "What is your temperature, how was it measured, and when did it start?" ?    No ?6. ASSOCIATED SYMPTOMS: "Are you passing urine more  frequently than usual?" ?    Maybe in the last couple weeks ?7. OTHER SYMPTOMS: "Do you have any other symptoms?" (e.g., back/flank pain, abdominal pain, vomiting) ?     No ?8. PREGNANCY: "Is there any chance you are pregnant?" "When was your last menstrual period?" ?    N/A ? ?Protocols used: Urine - Blood In-A-AH ? ?

## 2021-08-14 ENCOUNTER — Ambulatory Visit: Payer: 59 | Admitting: Physician Assistant

## 2021-08-14 ENCOUNTER — Encounter: Payer: Self-pay | Admitting: Physician Assistant

## 2021-08-14 VITALS — BP 129/66 | HR 63 | Temp 98.1°F | Resp 16 | Ht 72.0 in | Wt 281.0 lb

## 2021-08-14 DIAGNOSIS — R319 Hematuria, unspecified: Secondary | ICD-10-CM

## 2021-08-14 DIAGNOSIS — E1149 Type 2 diabetes mellitus with other diabetic neurological complication: Secondary | ICD-10-CM

## 2021-08-14 LAB — POCT URINALYSIS DIPSTICK
Bilirubin, UA: NEGATIVE
Blood, UA: POSITIVE
Glucose, UA: POSITIVE — AB
Ketones, UA: NEGATIVE
Leukocytes, UA: NEGATIVE
Nitrite, UA: NEGATIVE
Protein, UA: NEGATIVE
Spec Grav, UA: 1.025 (ref 1.010–1.025)
Urobilinogen, UA: 0.2 E.U./dL
pH, UA: 5 (ref 5.0–8.0)

## 2021-08-15 LAB — CBC WITH DIFFERENTIAL/PLATELET
Basophils Absolute: 0.1 10*3/uL (ref 0.0–0.2)
Basos: 1 %
EOS (ABSOLUTE): 0.2 10*3/uL (ref 0.0–0.4)
Eos: 2 %
Hematocrit: 41.2 % (ref 37.5–51.0)
Hemoglobin: 14 g/dL (ref 13.0–17.7)
Immature Grans (Abs): 0 10*3/uL (ref 0.0–0.1)
Immature Granulocytes: 0 %
Lymphocytes Absolute: 2.1 10*3/uL (ref 0.7–3.1)
Lymphs: 31 %
MCH: 29.4 pg (ref 26.6–33.0)
MCHC: 34 g/dL (ref 31.5–35.7)
MCV: 86 fL (ref 79–97)
Monocytes Absolute: 0.5 10*3/uL (ref 0.1–0.9)
Monocytes: 7 %
Neutrophils Absolute: 4.1 10*3/uL (ref 1.4–7.0)
Neutrophils: 59 %
Platelets: 244 10*3/uL (ref 150–450)
RBC: 4.77 x10E6/uL (ref 4.14–5.80)
RDW: 12.6 % (ref 11.6–15.4)
WBC: 6.9 10*3/uL (ref 3.4–10.8)

## 2021-08-15 LAB — COMPREHENSIVE METABOLIC PANEL
ALT: 17 IU/L (ref 0–44)
AST: 15 IU/L (ref 0–40)
Albumin/Globulin Ratio: 1.9 (ref 1.2–2.2)
Albumin: 4.5 g/dL (ref 3.8–4.9)
Alkaline Phosphatase: 72 IU/L (ref 44–121)
BUN/Creatinine Ratio: 17 (ref 9–20)
BUN: 20 mg/dL (ref 6–24)
Bilirubin Total: 0.3 mg/dL (ref 0.0–1.2)
CO2: 19 mmol/L — ABNORMAL LOW (ref 20–29)
Calcium: 9.1 mg/dL (ref 8.7–10.2)
Chloride: 98 mmol/L (ref 96–106)
Creatinine, Ser: 1.15 mg/dL (ref 0.76–1.27)
Globulin, Total: 2.4 g/dL (ref 1.5–4.5)
Glucose: 202 mg/dL — ABNORMAL HIGH (ref 70–99)
Potassium: 5.1 mmol/L (ref 3.5–5.2)
Sodium: 136 mmol/L (ref 134–144)
Total Protein: 6.9 g/dL (ref 6.0–8.5)
eGFR: 73 mL/min/{1.73_m2} (ref >59)

## 2021-08-15 LAB — MICROALBUMIN / CREATININE URINE RATIO
Creatinine, Urine: 119 mg/dL
Microalb/Creat Ratio: 22 mg/g creat (ref 0–29)
Microalbumin, Urine: 25.8 ug/mL

## 2021-08-15 LAB — CK: Total CK: 183 U/L (ref 41–331)

## 2021-08-18 ENCOUNTER — Other Ambulatory Visit: Payer: Self-pay | Admitting: Family Medicine

## 2021-08-18 DIAGNOSIS — E1149 Type 2 diabetes mellitus with other diabetic neurological complication: Secondary | ICD-10-CM

## 2021-09-01 ENCOUNTER — Other Ambulatory Visit: Payer: Self-pay | Admitting: Family Medicine

## 2021-09-01 DIAGNOSIS — E782 Mixed hyperlipidemia: Secondary | ICD-10-CM

## 2021-09-01 DIAGNOSIS — E119 Type 2 diabetes mellitus without complications: Secondary | ICD-10-CM

## 2021-09-03 ENCOUNTER — Other Ambulatory Visit: Payer: Self-pay | Admitting: Physician Assistant

## 2021-09-03 DIAGNOSIS — R319 Hematuria, unspecified: Secondary | ICD-10-CM

## 2021-09-03 NOTE — Progress Notes (Deleted)
Established Patient Office Visit  Name: Nathan Hull   MRN: [id number]    DOB: 1960-11-13   Date:09/03/2021  Today's Provider: Talitha Givens, MHS, PA-C Introduced myself to the patient as a PA-C and provided education on APPs in clinical practice.         Subjective  Chief Complaint  No chief complaint on file.   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 12/25/2020   S/P cervical spinal fusion 05/20/2018   Benign neoplasm of cecum    Rectal polyp    Diabetes mellitus without complication ([place]) 49/17/9150   Extreme obesity 11/14/2014   S/P CABG x 3 09/24/2013   HLD (hyperlipidemia) 06/19/2006   Benign essential HTN 06/19/2006   Idiopathic peripheral neuropathy 12/30/2005    Past Surgical History:  Procedure Laterality Date   APPLICATION OF WOUND VAC Right 10/22/2013   Procedure: APPLICATION OF WOUND VAC;  Surgeon: [name], MD;  Location: [facility] OR;  Service: Vascular;  Laterality: Right;   C-Spine disc replacement x 2     06/16/2012   CARDIAC CATHETERIZATION     last week @ [facility]   cardiac stents     has been cathed x 4   Had  MI  in 2007   [place]     on right   COLONOSCOPY WITH PROPOFOL N/A 04/26/2016   Procedure: COLONOSCOPY WITH PROPOFOL;  Surgeon: [name], MD;  Location: [place];  Service: Endoscopy;  Laterality: N/A;   CORONARY ARTERY BYPASS GRAFT N/A 09/24/2013   Procedure: CORONARY ARTERY BYPASS GRAFTING (CABG) x three, using LIMA to LAD, and right leg  greater saphenous vein harvested endoscopically - SVG to Ramus, SVG to PL;  Surgeon: [name], MD;  Location: [place];  Service: Open Heart Surgery;  Laterality: N/A;   HEMORRHOID SURGERY N/A 05/14/2018   Procedure: HEMORRHOIDECTOMY;  Surgeon: Benjamine Sprague, DO;  Location: [facility] ORS;  Service: General;  Laterality: N/A;   I & D EXTREMITY Right 10/22/2013   Procedure: IRRIGATION AND DEBRIDEMENT EXTREMITY-RIGHT LEG;  Surgeon: [name], MD;  Location: [place];  Service: Vascular;  Laterality: Right;   INTRAOPERATIVE TRANSESOPHAGEAL ECHOCARDIOGRAM N/A 09/24/2013   Procedure: INTRAOPERATIVE TRANSESOPHAGEAL ECHOCARDIOGRAM;  Surgeon: [name], MD;  Location: [place];  Service: Open Heart Surgery;  Laterality: N/A;   POLYPECTOMY  04/26/2016   Procedure: POLYPECTOMY;  Surgeon: [name], MD;  Location: [place];  Service: Endoscopy;;   PTCA at [facility]     04/28/2005   SPINAL FUSION     vertebra, congentital : Dr. Annette Stable    TONSILLECTOMY      Family History  Problem Relation Age of Onset   Hypertension Mother    Rheumatic fever Mother        S/P MVR   Hypertension Father     Social History   Tobacco Use   Smoking status: Former    Packs/day: 2.00    Types: Cigarettes    Quit  date: 04/23/2005    Years since quitting: 16.3   Smokeless tobacco: Never  Substance Use Topics   Alcohol use: Yes    Alcohol/week: 1.0 standard drink    Types: 1 Cans of beer per week    Comment: half a 6 pack a month     Current Outpatient Medications:    Blood Glucose Monitoring Suppl (ONE TOUCH ULTRA MINI) w/Device KIT, USE AS DIRECTED, Disp: 1 each, Rfl: 0   clopidogrel (PLAVIX) 75 MG tablet, TAKE 1 TABLET BY MOUTH EVERY DAY, Disp: 90 tablet, Rfl: 0   FARXIGA 10 MG TABS tablet, TAKE 1 TABLET BY MOUTH EVERY DAY, Disp: 90 tablet, Rfl: 0   lisinopril (ZESTRIL) 40 MG tablet, TAKE 1 TABLET BY MOUTH EVERY DAY, Disp: 90 tablet, Rfl: 1   metFORMIN (GLUCOPHAGE-XR) 750 MG 24 hr tablet, TAKE 1  TABLET BY MOUTH EVERY DAY WITH BREAKFAST, Disp: 90 tablet, Rfl: 1   metoprolol succinate (TOPROL-XL) 100 MG 24 hr tablet, TAKE 1 TABLET BY MOUTH EVERY DAY, Disp: 90 tablet, Rfl: 1   ONE TOUCH ULTRA TEST test strip, USE AS DIRECTED, Disp: 100 each, Rfl: 1   ONETOUCH DELICA LANCETS FINE MISC, See admin instructions., Disp: , Rfl: 0   rosuvastatin (CRESTOR) 20 MG tablet, TAKE 1 TABLET BY MOUTH EVERY DAY, Disp: 90 tablet, Rfl: 0   traMADol (ULTRAM) 50 MG tablet, TAKE 1 TABLET BY MOUTH TWICE A DAY, Disp: 60 tablet, Rfl: 2   Vitamin D, Ergocalciferol, (DRISDOL) 1.25 MG (50000 UNIT) CAPS capsule, Take 1 capsule (50,000 Units total) by mouth every 7 (seven) days., Disp: 13 capsule, Rfl: 3  Allergies  Allergen Reactions   Metformin And Related Other (See Comments)    Abdominal pain and diarrhea    I personally reviewed {Reviewed:14835} with the patient/caregiver today.   ROS    Objective  There were no vitals filed for this visit.  There is no height or weight on file to calculate BMI.  Physical Exam   Recent Results (from the past 2160 hour(s))  HgB A1c     Status: Abnormal   Collection Time: 07/13/21  3:23 PM  Result Value Ref Range   Hgb A1c MFr Bld 7.4 (H) 4.8 - 5.6 %    Comment:          Prediabetes: 5.7 - 6.4          Diabetes: >6.4          Glycemic control for adults with diabetes: <7.0    Est. average glucose Bld gHb Est-mCnc 166 mg/dL  POCT urinalysis dipstick     Status: Abnormal   Collection Time: 08/14/21  8:52 AM  Result Value Ref Range   Color, UA yellow    Clarity, UA clear    Glucose, UA Positive (A) Negative   Bilirubin, UA negative    Ketones, UA negative    Spec Grav, UA 1.025 1.010 - 1.025   Blood, UA positive    pH, UA 5.0 5.0 - 8.0   Protein, UA Negative Negative   Urobilinogen, UA 0.2 0.2 or 1.0 E.U./dL   Nitrite, UA negative    Leukocytes, UA Negative Negative   Appearance     Odor    CBC w/Diff/Platelet     Status: None   Collection Time:  08/14/21  9:15 AM  Result Value Ref Range   WBC 6.9 3.4 - 10.8 x10E3/uL   RBC 4.77 4.14 - 5.80 x10E6/uL   Hemoglobin 14.0 13.0 - 17.7 g/dL  Hematocrit 41.2 37.5 - 51.0 %   MCV 86 79 - 97 fL   MCH 29.4 26.6 - 33.0 pg   MCHC 34.0 31.5 - 35.7 g/dL   RDW 12.6 11.6 - 15.4 %   Platelets 244 150 - 450 x10E3/uL   Neutrophils 59 Not Estab. %   Lymphs 31 Not Estab. %   Monocytes 7 Not Estab. %   Eos 2 Not Estab. %   Basos 1 Not Estab. %   Neutrophils Absolute 4.1 1.4 - 7.0 x10E3/uL   Lymphocytes Absolute 2.1 0.7 - 3.1 x10E3/uL   Monocytes Absolute 0.5 0.1 - 0.9 x10E3/uL   EOS (ABSOLUTE) 0.2 0.0 - 0.4 x10E3/uL   Basophils Absolute 0.1 0.0 - 0.2 x10E3/uL   Immature Granulocytes 0 Not Estab. %   Immature Grans (Abs) 0.0 0.0 - 0.1 x10E3/uL  Comprehensive Metabolic Panel (CMET)     Status: Abnormal   Collection Time: 08/14/21  9:15 AM  Result Value Ref Range   Glucose 202 (H) 70 - 99 mg/dL   BUN 20 6 - 24 mg/dL   Creatinine, Ser 1.15 0.76 - 1.27 mg/dL   eGFR 73 >59 mL/min/1.73   BUN/Creatinine Ratio 17 9 - 20   Sodium 136 134 - 144 mmol/L   Potassium 5.1 3.5 - 5.2 mmol/L   Chloride 98 96 - 106 mmol/L   CO2 19 (L) 20 - 29 mmol/L   Calcium 9.1 8.7 - 10.2 mg/dL   Total Protein 6.9 6.0 - 8.5 g/dL   Albumin 4.5 3.8 - 4.9 g/dL   Globulin, Total 2.4 1.5 - 4.5 g/dL   Albumin/Globulin Ratio 1.9 1.2 - 2.2   Bilirubin Total 0.3 0.0 - 1.2 mg/dL   Alkaline Phosphatase 72 44 - 121 IU/L   AST 15 0 - 40 IU/L   ALT 17 0 - 44 IU/L  CK (Creatine Kinase)     Status: None   Collection Time: 08/14/21  9:15 AM  Result Value Ref Range   Total CK 183 41 - 331 U/L  Urine Albumin-Creatinine with uACR     Status: None   Collection Time: 08/14/21  9:38 AM  Result Value Ref Range   Creatinine, Urine 119.0 Not Estab. mg/dL   Microalbumin, Urine 25.8 Not Estab. ug/mL   Microalb/Creat Ratio 22 0 - 29 mg/g creat    Comment:                        Normal:                0 -  29                         Moderately increased: 30 - 300                        Severely increased:       >300      PHQ2/9:    08/14/2021    8:56 AM 01/10/2021   11:01 AM 10/10/2020    8:16 AM 06/06/2020    8:48 AM 02/04/2020    8:13 AM  Depression screen PHQ 2/9  Decreased Interest 0 0 0 0 0  Down, Depressed, Hopeless 0 0 0 0 0  PHQ - 2 Score 0 0 0 0 0  Altered sleeping 1 0  1   Tired, decreased energy 1 1  1    Change in appetite 0 1  1   Feeling bad or failure about yourself  0 0  0   Trouble concentrating 0 0  1   Moving slowly or fidgety/restless 0 0  0   Suicidal thoughts 0 0  0   PHQ-9 Score 2 2  4    Difficult doing work/chores Not difficult at all Not difficult at all  Somewhat difficult       Fall Risk:    08/14/2021    8:56 AM 01/10/2021   11:01 AM 10/10/2020    8:16 AM 06/06/2020    8:46 AM 02/04/2020    8:13 AM  [place] in the past year? 0 0 1 1 0  Number falls in past yr: 0 0 1 0 0  Injury with Fall? 0 0 0 0 0  Risk for fall due to :  No Fall Risks No Fall Risks No Fall Risks;History of fall(s) No Fall Risks  Follow up   Falls evaluation completed Falls evaluation completed;Falls prevention discussed;Education provided Falls evaluation completed      Functional Status Survey:      Assessment & Plan

## 2021-09-06 ENCOUNTER — Other Ambulatory Visit: Payer: Self-pay

## 2021-09-06 DIAGNOSIS — R319 Hematuria, unspecified: Secondary | ICD-10-CM

## 2021-09-06 NOTE — Progress Notes (Signed)
Patient came by the office stating that someone from our office called him earlier this week advising him to stop by the office to give a urine sample. Patient was last seen by Talitha Givens, PA-C on 08/14/2021 for hematuria. We contacted Junie Panning about this. Per Junie Panning: urinalysis needed to make sure hematuria is resolved since last visit.

## 2021-09-07 LAB — URINALYSIS
Bilirubin, UA: NEGATIVE
Ketones, UA: NEGATIVE
Leukocytes,UA: NEGATIVE
Nitrite, UA: NEGATIVE
Protein,UA: NEGATIVE
RBC, UA: NEGATIVE
Specific Gravity, UA: 1.012 (ref 1.005–1.030)
Urobilinogen, Ur: 0.2 mg/dL (ref 0.2–1.0)
pH, UA: 6 (ref 5.0–7.5)

## 2021-09-24 ENCOUNTER — Other Ambulatory Visit: Payer: Self-pay | Admitting: Family Medicine

## 2021-09-24 DIAGNOSIS — I10 Essential (primary) hypertension: Secondary | ICD-10-CM

## 2021-09-28 NOTE — Progress Notes (Signed)
Established patient visit  I,Joseline E Rosas,acting as a scribe for Gwyneth Sprout, FNP.,have documented all relevant documentation on the behalf of Gwyneth Sprout, FNP,as directed by  Gwyneth Sprout, FNP while in the presence of Gwyneth Sprout, FNP.   Patient: Nathan Hull   DOB: 16-Dec-1960   61 y.o. Male  MRN: [id number] Visit Date: 10/12/2021  Today's healthcare provider: Gwyneth Sprout, FNP  Re Introduced to nurse practitioner role and practice setting.  All questions answered.  Discussed provider/patient relationship and expectations.   Chief Complaint  Patient presents with   follow-up DM   Subjective    HPI  Diabetes Mellitus Type II, Follow-up  Lab Results  Component Value Date   HGBA1C 7.3 (A) 10/12/2021   HGBA1C 7.4 (H) 07/13/2021   HGBA1C 8.5 (A) 04/12/2021   Wt Readings from Last 3 Encounters:  10/12/21 280 lb 14.4 oz (127.4 kg)  08/14/21 281 lb (127.5 kg)  07/13/21 285 lb 11.2 oz (129.6 kg)   Last seen for diabetes 3 months ago.  Management since then includes continue lisinopril and metformin. He reports excellent compliance with treatment. He is not having side effects.  Symptoms: No fatigue No foot ulcerations  No appetite changes No nausea  No paresthesia of the feet  No polydipsia  No polyuria No visual disturbances   No vomiting     Home blood sugar records: fasting range: not being checked  Episodes of hypoglycemia? No    Current insulin regiment: none Most Recent Eye Exam: not UTD Current exercise: yard work Current diet habits: well balanced, but for the past 4-8 weeks more sweets.  Pertinent Labs: Lab Results  Component Value Date   CHOL 136 12/26/2020   HDL 27 (L) 12/26/2020   LDLCALC 51 12/26/2020   TRIG 291 (H) 12/26/2020   CHOLHDL 5.0 12/26/2020   Lab Results  Component Value Date   NA 136 08/14/2021   K 5.1 08/14/2021   CREATININE 1.15 08/14/2021   EGFR 73 08/14/2021   LABMICR 25.8 08/14/2021      ---------------------------------------------------------------------------------------------------   Medications: Outpatient Medications Prior to Visit  Medication Sig   Blood Glucose Monitoring Suppl (ONE TOUCH ULTRA MINI) w/Device KIT USE AS DIRECTED   clopidogrel (PLAVIX) 75 MG tablet TAKE 1 TABLET BY MOUTH EVERY DAY   FARXIGA 10 MG TABS tablet TAKE 1 TABLET BY MOUTH EVERY DAY   lisinopril (ZESTRIL) 40 MG tablet TAKE 1 TABLET BY MOUTH EVERY DAY   metFORMIN (GLUCOPHAGE-XR) 750 MG 24 hr tablet TAKE 1 TABLET BY MOUTH EVERY DAY WITH BREAKFAST   metoprolol succinate (TOPROL-XL) 100 MG 24 hr tablet TAKE 1 TABLET BY MOUTH EVERY DAY   ONE TOUCH ULTRA TEST test strip USE AS DIRECTED   ONETOUCH DELICA LANCETS FINE MISC See admin instructions.   rosuvastatin (CRESTOR) 20 MG tablet TAKE 1 TABLET BY MOUTH EVERY DAY   traMADol (ULTRAM) 50 MG tablet TAKE 1 TABLET BY MOUTH TWICE A DAY   Vitamin D, Ergocalciferol, (DRISDOL) 1.25 MG (50000 UNIT) CAPS capsule Take 1 capsule (50,000 Units total) by mouth every 7 (seven) days.   No facility-administered medications prior to visit.    Review of Systems      Objective    BP 115/61 (BP Location: Right Arm, Patient Position: Sitting, Cuff Size: Large)   Pulse (!) 55   Temp 98.1 F (36.7 C) (Oral)   Resp 16   Ht 6' (1.829 m)   Wt 280  lb 14.4 oz (127.4 kg)   BMI 38.10 kg/m  BP Readings from Last 3 Encounters:  10/12/21 115/61  08/14/21 129/66  07/13/21 128/69   Wt Readings from Last 3 Encounters:  10/12/21 280 lb 14.4 oz (127.4 kg)  08/14/21 281 lb (127.5 kg)  07/13/21 285 lb 11.2 oz (129.6 kg)   SpO2 Readings from Last 3 Encounters:  08/14/21 97%  07/13/21 97%  04/12/21 97%      Physical Exam Vitals and nursing note reviewed.  Constitutional:      Appearance: Normal appearance. He is obese.  HENT:     Head: Normocephalic and atraumatic.  Eyes:     Pupils: Pupils are equal, round, and reactive to light.  Cardiovascular:      Rate and Rhythm: Regular rhythm. Bradycardia present.     Pulses: Normal pulses.          Dorsalis pedis pulses are 2+ on the right side and 2+ on the left side.       Posterior tibial pulses are 2+ on the right side and 2+ on the left side.     Heart sounds: Normal heart sounds.  Pulmonary:     Effort: Pulmonary effort is normal.     Breath sounds: Normal breath sounds.  Musculoskeletal:        General: Normal range of motion.     Cervical back: Normal range of motion.  Feet:     Right foot:     Protective Sensation: 10 sites tested.  10 sites sensed.     Skin integrity: Callus present.     Toenail Condition: Right toenails are abnormally thick. Fungal disease present.    Left foot:     Protective Sensation: 10 sites tested.  10 sites sensed.     Skin integrity: Callus present.     Toenail Condition: Left toenails are abnormally thick. Fungal disease present. Skin:    General: Skin is warm and dry.     Capillary Refill: Capillary refill takes less than 2 seconds.  Neurological:     General: No focal deficit present.     Mental Status: He is alert and oriented to person, place, and time. Mental status is at baseline.  Psychiatric:        Mood and Affect: Mood normal.        Behavior: Behavior normal.        Thought Content: Thought content normal.        Judgment: Judgment normal.       Results for orders placed or performed in visit on 10/12/21  POCT glycosylated hemoglobin (Hb A1C)  Result Value Ref Range   Hemoglobin A1C 7.3 (A) 4.0 - 5.6 %   Est. average glucose Bld gHb Est-mCnc 163     Assessment & Plan     Problem List Items Addressed This Visit       Cardiovascular and Mediastinum   Hypertension associated with diabetes ([place])    Chronic, stable At goal <130/<80 Continue 40 mg lisinopril Continue 100 mg metop succinate, slight bradycardia noted; pt denies further complications Trace edema noted on BLE      Relevant Orders   Comprehensive Metabolic Panel  (CMET)   CBC with Differential/Platelet     Endocrine   Hyperlipidemia associated with type 2 diabetes mellitus (H[facility])    Chronic, previously elevated Hx of CABG Continue Crestor 20 mg recommend diet low in saturated fat and regular exercise - 30 min at least 5 times per week  Relevant Orders   Lipid panel   Type 2 diabetes mellitus with neurological complications ([place]) - Primary    Chronic, improved Continue to recommend balanced, lower carb meals. Smaller meal size, adding snacks. Choosing water as drink of choice and increasing purposeful exercise. A1c stable Continue farxiga 10 mg Continue metformin 750 mg  On Acei On statin Foot exam completed Due for eye exam Urine UTD      Relevant Orders   POCT glycosylated hemoglobin (Hb A1C) (Completed)   Hemoglobin A1c   Urine Microalbumin w/creat. ratio     Other   High risk medication use    Chronic, stable Continue tramadol 50 mg BID PRN      Other Visit Diagnoses     Screening PSA (prostate specific antigen)       Relevant Orders   PSA   Screening for thyroid disorder       Relevant Orders   TSH + free T4   Vitamin D deficiency       Relevant Orders   Vitamin D (25 hydroxy)      Additional labs ordered for CPE; to allow for patient to come in for afternoon appointment without fasting all day at work.  Return in about 5 months (around 03/14/2022) for annual examination.      Vonna Kotyk, FNP, have reviewed all documentation for this visit. The documentation on 10/12/21 for the exam, diagnosis, procedures, and orders are all accurate and complete.    Gwyneth Sprout, [place] [phone] (phone) [phone] (fax)  [place]

## 2021-10-01 ENCOUNTER — Other Ambulatory Visit: Payer: Self-pay | Admitting: Family Medicine

## 2021-10-01 DIAGNOSIS — I25810 Atherosclerosis of coronary artery bypass graft(s) without angina pectoris: Secondary | ICD-10-CM

## 2021-10-01 DIAGNOSIS — I10 Essential (primary) hypertension: Secondary | ICD-10-CM

## 2021-10-12 ENCOUNTER — Encounter: Payer: Self-pay | Admitting: Family Medicine

## 2021-10-12 ENCOUNTER — Ambulatory Visit: Payer: 59 | Admitting: Family Medicine

## 2021-10-12 VITALS — BP 115/61 | HR 55 | Temp 98.1°F | Resp 16 | Ht 72.0 in | Wt 280.9 lb

## 2021-10-12 DIAGNOSIS — Z79899 Other long term (current) drug therapy: Secondary | ICD-10-CM | POA: Insufficient documentation

## 2021-10-12 DIAGNOSIS — Z1329 Encounter for screening for other suspected endocrine disorder: Secondary | ICD-10-CM

## 2021-10-12 DIAGNOSIS — E1149 Type 2 diabetes mellitus with other diabetic neurological complication: Secondary | ICD-10-CM

## 2021-10-12 DIAGNOSIS — Z125 Encounter for screening for malignant neoplasm of prostate: Secondary | ICD-10-CM

## 2021-10-12 DIAGNOSIS — E785 Hyperlipidemia, unspecified: Secondary | ICD-10-CM

## 2021-10-12 DIAGNOSIS — E1159 Type 2 diabetes mellitus with other circulatory complications: Secondary | ICD-10-CM | POA: Diagnosis not present

## 2021-10-12 DIAGNOSIS — I152 Hypertension secondary to endocrine disorders: Secondary | ICD-10-CM

## 2021-10-12 DIAGNOSIS — E1169 Type 2 diabetes mellitus with other specified complication: Secondary | ICD-10-CM

## 2021-10-12 DIAGNOSIS — E559 Vitamin D deficiency, unspecified: Secondary | ICD-10-CM

## 2021-10-12 LAB — POCT GLYCOSYLATED HEMOGLOBIN (HGB A1C)
Est. average glucose Bld gHb Est-mCnc: 163
Hemoglobin A1C: 7.3 % — AB (ref 4.0–5.6)

## 2021-10-12 NOTE — Assessment & Plan Note (Signed)
Chronic, improved Continue to recommend balanced, lower carb meals. Smaller meal size, adding snacks. Choosing water as drink of choice and increasing purposeful exercise. A1c stable Continue farxiga 10 mg Continue metformin 750 mg  On Acei On statin Foot exam completed Due for eye exam Urine UTD

## 2021-10-12 NOTE — Assessment & Plan Note (Signed)
Chronic, stable At goal <130/<80 Continue 40 mg lisinopril Continue 100 mg metop succinate, slight bradycardia noted; pt denies further complications Trace edema noted on BLE

## 2021-10-12 NOTE — Assessment & Plan Note (Signed)
Chronic, previously elevated Hx of CABG Continue Crestor 20 mg recommend diet low in saturated fat and regular exercise - 30 min at least 5 times per week

## 2021-10-12 NOTE — Patient Instructions (Signed)
The CDC recommends two doses of Shingrix (the new shingles vaccine) separated by 2 to 6 months for adults age 59 years and older. I recommend checking with your insurance plan regarding coverage for this vaccine.    Please schedule eye visit.

## 2021-10-12 NOTE — Assessment & Plan Note (Signed)
Chronic, stable Continue tramadol 50 mg BID PRN

## 2021-10-31 ENCOUNTER — Other Ambulatory Visit: Payer: Self-pay | Admitting: Family Medicine

## 2021-10-31 DIAGNOSIS — Z981 Arthrodesis status: Secondary | ICD-10-CM

## 2021-10-31 DIAGNOSIS — M542 Cervicalgia: Secondary | ICD-10-CM

## 2021-10-31 NOTE — Telephone Encounter (Signed)
Requested medication (s) are due for refill today - yes  Requested medication (s) are on the active medication list -yes  Future visit scheduled -yes  Last refill: 08/02/21 #60 2RF  Notes to clinic: non delegated Rx  Requested Prescriptions  Pending Prescriptions Disp Refills   traMADol (ULTRAM) 50 MG tablet [Pharmacy Med Name: TRAMADOL HCL 50 MG TABLET] 60 tablet 2    Sig: TAKE 1 TABLET BY MOUTH TWICE A DAY     Not Delegated - Analgesics:  Opioid Agonists Failed - 10/31/2021  1:03 PM      Failed - This refill cannot be delegated      Passed - Urine Drug Screen completed in last 360 days      Passed - Valid encounter within last 3 months    Recent Outpatient Visits           2 weeks ago Type 2 diabetes mellitus with neurological complications [facility])   [facility] Tally Joe T, FNP   2 months ago Hematuria of unknown cause   [employer], Erin E, PA-C   3 months ago Type 2 diabetes mellitus with neurological complications ([place])   [facility] Spicer, [place], PA-C   6 months ago Hypertension associated with diabetes [facility])   [facility] Gwyneth Sprout, FNP   9 months ago Encounter for completion of form with patient   [facility] Gwyneth Sprout, FNP       Future Appointments             In 4 months Gwyneth Sprout, [place], [place]               Requested Prescriptions  Pending Prescriptions Disp Refills   traMADol (ULTRAM) 50 MG tablet [Pharmacy Med Name: TRAMADOL HCL 50 MG TABLET] 60 tablet 2    Sig: TAKE 1 TABLET BY MOUTH TWICE A DAY     Not Delegated - Analgesics:  Opioid Agonists Failed - 10/31/2021  1:03 PM      Failed - This refill cannot be delegated      Passed - Urine Drug Screen completed in last 360 days      Passed - Valid encounter within last 3 months    Recent Outpatient Visits           2 weeks ago Type 2 diabetes mellitus with neurological  complications [facility])   [facility] Tally Joe T, FNP   2 months ago Hematuria of unknown cause   [employer], Erin E, PA-C   3 months ago Type 2 diabetes mellitus with neurological complications [facility])   [facility], [place], PA-C   6 months ago Hypertension associated with diabetes [facility])   [facility] Gwyneth Sprout, FNP   9 months ago Encounter for completion of form with patient   [facility] Gwyneth Sprout, FNP       Future Appointments             In 4 months Gwyneth Sprout, [place], [place]

## 2021-12-31 ENCOUNTER — Other Ambulatory Visit: Payer: Self-pay | Admitting: Family Medicine

## 2021-12-31 DIAGNOSIS — I10 Essential (primary) hypertension: Secondary | ICD-10-CM

## 2022-01-16 LAB — HM DIABETES EYE EXAM

## 2022-01-28 ENCOUNTER — Other Ambulatory Visit: Payer: Self-pay | Admitting: Family Medicine

## 2022-01-28 DIAGNOSIS — I10 Essential (primary) hypertension: Secondary | ICD-10-CM

## 2022-01-28 DIAGNOSIS — E559 Vitamin D deficiency, unspecified: Secondary | ICD-10-CM

## 2022-01-28 DIAGNOSIS — E782 Mixed hyperlipidemia: Secondary | ICD-10-CM

## 2022-01-28 DIAGNOSIS — E119 Type 2 diabetes mellitus without complications: Secondary | ICD-10-CM

## 2022-01-28 DIAGNOSIS — M542 Cervicalgia: Secondary | ICD-10-CM

## 2022-01-28 DIAGNOSIS — Z981 Arthrodesis status: Secondary | ICD-10-CM

## 2022-01-28 DIAGNOSIS — E1149 Type 2 diabetes mellitus with other diabetic neurological complication: Secondary | ICD-10-CM

## 2022-02-26 ENCOUNTER — Other Ambulatory Visit: Payer: Self-pay | Admitting: Family Medicine

## 2022-02-26 DIAGNOSIS — Z981 Arthrodesis status: Secondary | ICD-10-CM

## 2022-02-26 DIAGNOSIS — M542 Cervicalgia: Secondary | ICD-10-CM

## 2022-03-01 NOTE — Telephone Encounter (Addendum)
Pt calling to follow up on medication refills. Pt stated he is out of his medication.   Please advise.

## 2022-03-08 ENCOUNTER — Encounter: Payer: Self-pay | Admitting: Family Medicine

## 2022-03-08 ENCOUNTER — Ambulatory Visit (INDEPENDENT_AMBULATORY_CARE_PROVIDER_SITE_OTHER): Payer: 59 | Admitting: Family Medicine

## 2022-03-08 VITALS — BP 105/78 | HR 76 | Temp 98.1°F | Ht 72.0 in | Wt 282.4 lb

## 2022-03-08 DIAGNOSIS — E1159 Type 2 diabetes mellitus with other circulatory complications: Secondary | ICD-10-CM | POA: Diagnosis not present

## 2022-03-08 DIAGNOSIS — E1149 Type 2 diabetes mellitus with other diabetic neurological complication: Secondary | ICD-10-CM

## 2022-03-08 DIAGNOSIS — I25810 Atherosclerosis of coronary artery bypass graft(s) without angina pectoris: Secondary | ICD-10-CM | POA: Diagnosis not present

## 2022-03-08 DIAGNOSIS — E559 Vitamin D deficiency, unspecified: Secondary | ICD-10-CM

## 2022-03-08 DIAGNOSIS — I152 Hypertension secondary to endocrine disorders: Secondary | ICD-10-CM

## 2022-03-08 DIAGNOSIS — Z0000 Encounter for general adult medical examination without abnormal findings: Secondary | ICD-10-CM | POA: Diagnosis not present

## 2022-03-08 DIAGNOSIS — Z125 Encounter for screening for malignant neoplasm of prostate: Secondary | ICD-10-CM

## 2022-03-08 DIAGNOSIS — Z79899 Other long term (current) drug therapy: Secondary | ICD-10-CM

## 2022-03-08 DIAGNOSIS — E785 Hyperlipidemia, unspecified: Secondary | ICD-10-CM

## 2022-03-08 DIAGNOSIS — E1169 Type 2 diabetes mellitus with other specified complication: Secondary | ICD-10-CM

## 2022-03-08 MED ORDER — CLOPIDOGREL BISULFATE 75 MG PO TABS: 75.0000 mg | ORAL_TABLET | Freq: Every day | ORAL | 3 refills | Status: DC

## 2022-03-08 MED ORDER — LISINOPRIL 40 MG PO TABS: 40.0000 mg | ORAL_TABLET | Freq: Every day | ORAL | 3 refills | Status: DC

## 2022-03-08 MED ORDER — METOPROLOL SUCCINATE ER 100 MG PO TB24: 100.0000 mg | ORAL_TABLET | Freq: Every day | ORAL | 3 refills | Status: DC

## 2022-03-08 MED ORDER — DAPAGLIFLOZIN PROPANEDIOL 10 MG PO TABS: 10.0000 mg | ORAL_TABLET | Freq: Every day | ORAL | 3 refills | Status: DC

## 2022-03-08 NOTE — Assessment & Plan Note (Signed)
Previously low at 17; repeat labs. Goal >35

## 2022-03-08 NOTE — Assessment & Plan Note (Signed)
Chronic, Stable Continue Farxiga 10, Lisinopril 40 mg, Toprol 100 mg At goal <130/<80

## 2022-03-08 NOTE — Assessment & Plan Note (Signed)
Currently on tramadol 50 mg BID; pt notes uncontrolled pain and has been using NSAIDS and APAP to assist Request to increase dose up to TID Willing to provide urine for drug testing

## 2022-03-08 NOTE — Assessment & Plan Note (Signed)
Chronic, stable goal of LDL <70 Repeat LP Currently on Crestor 20 mg

## 2022-03-08 NOTE — Assessment & Plan Note (Signed)
Chronic, hx of CVA with slight residual L deficit per pt report Repeat A1c Continue to recommend balanced, lower carb meals. Smaller meal size, adding snacks. Choosing water as drink of choice and increasing purposeful exercise. Continue Farxiga 10 mg  Continue Metformin XR 750 mg

## 2022-03-08 NOTE — Assessment & Plan Note (Signed)
Denies LUTS; recommend PSA in place of DRE. If PSA is elevated for age, we will repeat; if PSA remains elevated pt will be referred to urology for DRE and next steps for best treatment.  

## 2022-03-08 NOTE — Progress Notes (Signed)
I,Connie R Striblin,acting as a Education administrator for Gwyneth Sprout, FNP.,have documented all relevant documentation on the behalf of Gwyneth Sprout, FNP,as directed by  Gwyneth Sprout, FNP while in the presence of Gwyneth Sprout, FNP.   Complete physical exam   Patient: Nathan Hull   DOB: 1960/12/18   61 y.o. Male  MRN: [id number] Visit Date: 03/08/2022  Today's healthcare provider: Gwyneth Sprout, FNP  Re Introduced to nurse practitioner role and practice setting.  All questions answered.  Discussed provider/patient relationship and expectations.   Chief Complaint  Patient presents with   Annual Exam   Pain Management   Subjective    MICKELL BIRDWELL is a 61 y.o. male who presents today for a complete physical exam.  He reports consuming a general diet. The patient does not participate in regular exercise at present. He generally feels fairly well. He reports sleeping well. He does have additional problems to discuss today.  HPI  Pt would like to increase Tramadol to X4 daily for neck and shoulder pain.  Past Medical History:  Diagnosis Date   Arthritis    CAD (coronary artery disease)    Carpal tunnel syndrome    Diabetes mellitus without complication ([place])    Difficult intubation 09/24/2013   reports told difficult after CABG at [facility].  Glidescope used   Dyspnea    walking a mile   Family history of anesthesia complication    mother had N/V after valve replacement   Fusion of spine of cervical region    History of kidney stones    Hyperlipidemia    Hypertension    Myocardial infarct [facility]) 2007   Obesity    Wears contact lenses    Past Surgical History:  Procedure Laterality Date   APPLICATION OF WOUND VAC Right 10/22/2013   Procedure: APPLICATION OF WOUND VAC;  Surgeon: [name], MD;  Location: [place];  Service: Vascular;  Laterality: Right;   C-Spine disc replacement x 2     06/16/2012   CARDIAC CATHETERIZATION     last week @ [facility]   cardiac stents     has been  cathed x 4   Had  MI  in 2007   [place]     on right   COLONOSCOPY WITH PROPOFOL N/A 04/26/2016   Procedure: COLONOSCOPY WITH PROPOFOL;  Surgeon: [name], MD;  Location: [place];  Service: Endoscopy;  Laterality: N/A;   CORONARY ARTERY BYPASS GRAFT N/A 09/24/2013   Procedure: CORONARY ARTERY BYPASS GRAFTING (CABG) x three, using LIMA to LAD, and right leg greater saphenous vein harvested endoscopically - SVG to Ramus, SVG to PL;  Surgeon: [name], MD;  Location: [place];  Service: Open Heart Surgery;  Laterality: N/A;   HEMORRHOID SURGERY N/A 05/14/2018   Procedure: HEMORRHOIDECTOMY;  Surgeon: Benjamine Sprague, DO;  Location: [facility];  Service: General;  Laterality: N/A;   I & D EXTREMITY Right 10/22/2013   Procedure: IRRIGATION AND DEBRIDEMENT EXTREMITY-RIGHT LEG;  Surgeon: [name], MD;  Location: [place];  Service: Vascular;  Laterality: Right;   INTRAOPERATIVE TRANSESOPHAGEAL ECHOCARDIOGRAM N/A 09/24/2013   Procedure: INTRAOPERATIVE TRANSESOPHAGEAL ECHOCARDIOGRAM;  Surgeon: [name], MD;  Location: [place];  Service: Open Heart Surgery;  Laterality: N/A;   POLYPECTOMY  04/26/2016   Procedure: POLYPECTOMY;  Surgeon: [name], MD;  Location: [place];  Service: Endoscopy;;   PTCA at [facility]     04/28/2005  SPINAL FUSION     vertebra, [facility] : Dr. Annette Stable    TONSILLECTOMY     Social History   Socioeconomic History   Marital status: Divorced    Spouse name: Not on file   Number of children: Not on file   Years of education: Not on file   Highest education level: Not on file  Occupational History   Not on file  Tobacco Use   Smoking status: Former    Packs/day: 2.00    Types: Cigarettes    Quit date: 04/23/2005    Years since quitting: 16.8   Smokeless tobacco: Never  Vaping Use   Vaping Use: Never used  Substance and Sexual Activity   Alcohol use: Yes    Alcohol/week: 1.0 standard drink of alcohol    Types: 1 Cans of beer  per week    Comment: half a 6 pack a month   Drug use: No   Sexual activity: Not on file  Other Topics Concern   Not on file  Social History Narrative   Not on file   Social Determinants of Health   Financial Resource Strain: Not on file  Food Insecurity: Not on file  Transportation Needs: Not on file  Physical Activity: Not on file  Stress: Not on file  Social Connections: Not on file  Intimate Partner Violence: Not on file   Family Status  Relation Name Status   Mother  (Not Specified)   Father  (Not Specified)   Family History  Problem Relation Age of Onset   Hypertension Mother    Rheumatic fever Mother        S/P MVR   Hypertension Father    Allergies  Allergen Reactions   Metformin And Related Other (See Comments)    Abdominal pain and diarrhea    Patient Care Team: Gwyneth Sprout, FNP as PCP - General (Family Medicine) Dionisio David, MD (Cardiology) Dahlia Byes, MD as Consulting Physician (Cardiothoracic Surgery) Dwain Sarna, OD as Referring Physician (Optometry)   Medications: Outpatient Medications Prior to Visit  Medication Sig   Blood Glucose Monitoring Suppl (ONE TOUCH ULTRA MINI) w/Device KIT USE AS DIRECTED   metFORMIN (GLUCOPHAGE-XR) 750 MG 24 hr tablet TAKE 1 TABLET BY MOUTH EVERY DAY WITH BREAKFAST   ONE TOUCH ULTRA TEST test strip USE AS DIRECTED   ONETOUCH DELICA LANCETS FINE MISC See admin instructions.   rosuvastatin (CRESTOR) 20 MG tablet TAKE 1 TABLET BY MOUTH EVERY DAY   traMADol (ULTRAM) 50 MG tablet TAKE 1 TABLET BY MOUTH TWICE A DAY   Vitamin D, Ergocalciferol, (DRISDOL) 1.25 MG (50000 UNIT) CAPS capsule TAKE 1 CAPSULE (50,000 UNITS TOTAL) BY MOUTH EVERY 7 (SEVEN) DAYS   [DISCONTINUED] clopidogrel (PLAVIX) 75 MG tablet TAKE 1 TABLET BY MOUTH EVERY DAY   [DISCONTINUED] FARXIGA 10 MG TABS tablet TAKE 1 TABLET BY MOUTH EVERY DAY   [DISCONTINUED] lisinopril (ZESTRIL) 40 MG tablet TAKE 1 TABLET BY MOUTH EVERY DAY   [DISCONTINUED]  metoprolol succinate (TOPROL-XL) 100 MG 24 hr tablet TAKE 1 TABLET BY MOUTH EVERY DAY   No facility-administered medications prior to visit.    Review of Systems  Constitutional:  Positive for fatigue.  Musculoskeletal:  Positive for joint swelling, neck pain and neck stiffness.    Last CBC Lab Results  Component Value Date   WBC 6.9 08/14/2021   HGB 14.0 08/14/2021   HCT 41.2 08/14/2021   MCV 86 08/14/2021   MCH 29.4 08/14/2021   RDW 12.6  08/14/2021   PLT 244 01/77/9390   Last metabolic panel Lab Results  Component Value Date   GLUCOSE 202 (H) 08/14/2021   NA 136 08/14/2021   K 5.1 08/14/2021   CL 98 08/14/2021   CO2 19 (L) 08/14/2021   BUN 20 08/14/2021   CREATININE 1.15 08/14/2021   EGFR 73 08/14/2021   CALCIUM 9.1 08/14/2021   PROT 6.9 08/14/2021   ALBUMIN 4.5 08/14/2021   LABGLOB 2.4 08/14/2021   AGRATIO 1.9 08/14/2021   BILITOT 0.3 08/14/2021   ALKPHOS 72 08/14/2021   AST 15 08/14/2021   ALT 17 08/14/2021   ANIONGAP 7 12/27/2020   Last lipids Lab Results  Component Value Date   CHOL 136 12/26/2020   HDL 27 (L) 12/26/2020   LDLCALC 51 12/26/2020   TRIG 291 (H) 12/26/2020   CHOLHDL 5.0 12/26/2020   Last hemoglobin A1c Lab Results  Component Value Date   HGBA1C 7.3 (A) 10/12/2021   Last thyroid functions Lab Results  Component Value Date   TSH 1.190 02/04/2020   Last vitamin D Lab Results  Component Value Date   VD25OH 17.4 (L) 07/05/2020   Last vitamin B12 and Folate No results found for: "VITAMINB12", "FOLATE"    Objective    BP 105/78 (BP Location: Left Arm, Patient Position: Sitting, Cuff Size: Large)   Pulse 76   Temp 98.1 F (36.7 C) (Oral)   Ht 6' (1.829 m)   Wt 282 lb 6.4 oz (128.1 kg)   SpO2 98%   BMI 38.30 kg/m  BP Readings from Last 3 Encounters:  03/08/22 105/78  10/12/21 115/61  08/14/21 129/66   Wt Readings from Last 3 Encounters:  03/08/22 282 lb 6.4 oz (128.1 kg)  10/12/21 280 lb 14.4 oz (127.4 kg)  08/14/21  281 lb (127.5 kg)   SpO2 Readings from Last 3 Encounters:  03/08/22 98%  08/14/21 97%  07/13/21 97%       Physical Exam Vitals and nursing note reviewed.  Constitutional:      General: He is awake. He is not in acute distress.    Appearance: Normal appearance. He is well-developed and well-groomed. He is obese. He is not ill-appearing, toxic-appearing or diaphoretic.  HENT:     Head: Normocephalic and atraumatic.     Jaw: There is normal jaw occlusion. No trismus, tenderness, swelling or pain on movement.     Salivary Glands: Right salivary gland is not diffusely enlarged or tender. Left salivary gland is not diffusely enlarged or tender.     Right Ear: Hearing, tympanic membrane, ear canal and external ear normal. There is no impacted cerumen.     Left Ear: Hearing, tympanic membrane, ear canal and external ear normal. There is no impacted cerumen.     Nose: Nose normal. No congestion or rhinorrhea.     Right Turbinates: Not enlarged, swollen or pale.     Left Turbinates: Not enlarged, swollen or pale.     Right Sinus: No maxillary sinus tenderness or frontal sinus tenderness.     Left Sinus: No maxillary sinus tenderness or frontal sinus tenderness.     Mouth/Throat:     Lips: Pink.     Mouth: Mucous membranes are moist. No injury, lacerations, oral lesions or angioedema.     Pharynx: Oropharynx is clear. Uvula midline. No pharyngeal swelling, oropharyngeal exudate or posterior oropharyngeal erythema.     Tonsils: No tonsillar exudate or tonsillar abscesses.  Eyes:     General: Lids are normal. Vision  grossly intact. Gaze aligned appropriately.        Right eye: No discharge.        Left eye: No discharge.     Extraocular Movements: Extraocular movements intact.     Conjunctiva/sclera: Conjunctivae normal.     Pupils: Pupils are equal, round, and reactive to light.  Neck:     Thyroid: No thyroid mass, thyromegaly or thyroid tenderness.     Vascular: No carotid bruit.      Trachea: Trachea normal. No tracheal tenderness.  Cardiovascular:     Rate and Rhythm: Normal rate and regular rhythm.     Pulses: Normal pulses.          Carotid pulses are 2+ on the right side and 2+ on the left side.      Radial pulses are 2+ on the right side and 2+ on the left side.       Femoral pulses are 2+ on the right side and 2+ on the left side.      Popliteal pulses are 2+ on the right side and 2+ on the left side.       Dorsalis pedis pulses are 2+ on the right side and 2+ on the left side.       Posterior tibial pulses are 2+ on the right side and 2+ on the left side.     Heart sounds: Normal heart sounds, S1 normal and S2 normal. No murmur heard.    No friction rub. No gallop.  Pulmonary:     Effort: Pulmonary effort is normal. No respiratory distress.     Breath sounds: Normal breath sounds and air entry. No stridor. No wheezing, rhonchi or rales.  Chest:     Chest wall: No tenderness.  Abdominal:     General: Abdomen is flat. Bowel sounds are normal. There is no distension.     Palpations: Abdomen is soft. There is no mass.     Tenderness: There is no abdominal tenderness. There is no guarding or rebound.     Hernia: No hernia is present.  Genitourinary:    Comments: Exam deferred; denies complaints Musculoskeletal:        General: No swelling, tenderness, deformity or signs of injury. Normal range of motion.     Cervical back: Normal range of motion and neck supple. No rigidity or tenderness.     Right lower leg: No edema.     Left lower leg: No edema.  Lymphadenopathy:     Cervical: No cervical adenopathy.     Right cervical: No superficial, deep or posterior cervical adenopathy.    Left cervical: No superficial, deep or posterior cervical adenopathy.  Skin:    General: Skin is warm and dry.     Capillary Refill: Capillary refill takes less than 2 seconds.     Coloration: Skin is not jaundiced or pale.     Findings: No bruising, erythema, lesion or rash.   Neurological:     General: No focal deficit present.     Mental Status: He is alert and oriented to person, place, and time. Mental status is at baseline.     GCS: GCS eye subscore is 4. GCS verbal subscore is 5. GCS motor subscore is 6.     Sensory: Sensation is intact. No sensory deficit.     Motor: Motor function is intact. No weakness.     Coordination: Coordination is intact.     Gait: Gait is intact.  Psychiatric:  Attention and Perception: Attention and perception normal.        Mood and Affect: Mood and affect normal.        Speech: Speech normal.        Behavior: Behavior normal. Behavior is cooperative.        Thought Content: Thought content normal.        Cognition and Memory: Cognition normal.        Judgment: Judgment normal.     Last depression screening scores    03/08/2022    2:44 PM 08/14/2021    8:56 AM 01/10/2021   11:01 AM  PHQ 2/9 Scores  PHQ - 2 Score 0 0 0  PHQ- 9 Score _0 Last fall risk screening    03/08/2022    2:44 PM  [place] in the past year? 0  Number falls in past yr: 0  Injury with Fall? 0   Last Audit-C alcohol use screening    03/08/2022    2:45 PM  Alcohol Use Disorder Test (AUDIT)  1. How often do you have a drink containing alcohol? 1  2. How many drinks containing alcohol do you have on a typical day when you are drinking? 0  3. How often do you have six or more drinks on one occasion? 0  AUDIT-C Score 1   A score of 3 or more in women, and 4 or more in men indicates increased risk for alcohol abuse, EXCEPT if all of the points are from question 1   No results found for any visits on 03/08/22.  Assessment & Plan    Routine Health Maintenance and Physical Exam  Exercise Activities and Dietary recommendations  Goals   None     Immunization History  Administered Date(s) Administered   Pneumococcal Polysaccharide-23 09/18/2018   Tdap 02/04/2020    Health Maintenance  Topic Date Due   COVID-19  Vaccine (1) 03/24/2022 (Originally 07/02/1965)   Zoster Vaccines- Shingrix (1 of 2) 06/07/2022 (Originally 07/01/1981)   INFLUENZA VACCINE  06/30/2022 (Originally 10/30/2021)   HEMOGLOBIN A1C  04/14/2022   Diabetic kidney evaluation - eGFR measurement  08/15/2022   Diabetic kidney evaluation - Urine ACR  08/15/2022   FOOT EXAM  10/13/2022   OPHTHALMOLOGY EXAM  01/17/2023   COLONOSCOPY (Pts 45-41yr Insurance coverage will need to be confirmed)  04/26/2026   DTaP/Tdap/Td (2 - Td or Tdap) 02/03/2030   Hepatitis C Screening  Completed   HIV Screening  Completed   HPV VACCINES  Aged Out    Discussed health benefits of physical activity, and encouraged him to engage in regular exercise appropriate for his age and condition.  Problem List Items Addressed This Visit       Cardiovascular and Mediastinum   Coronary artery disease involving coronary bypass graft of native heart    Continue Plavix 75 mg Recommend ASA 81 mg Repeat LP CMP CBC A1C      Relevant Medications   clopidogrel (PLAVIX) 75 MG tablet   lisinopril (ZESTRIL) 40 MG tablet   metoprolol succinate (TOPROL-XL) 100 MG 24 hr tablet   Other Relevant Orders   Comprehensive Metabolic Panel (CMET)   Hemoglobin A1c   CBC   Lipid panel   Hypertension associated with diabetes (HCC)    Chronic, Stable Continue Farxiga 10, Lisinopril 40 mg, Toprol 100 mg At goal <130/<80      Relevant Medications   dapagliflozin propanediol (FARXIGA) 10 MG TABS tablet  lisinopril (ZESTRIL) 40 MG tablet   metoprolol succinate (TOPROL-XL) 100 MG 24 hr tablet   Other Relevant Orders   Comprehensive Metabolic Panel (CMET)   CBC   Lipid panel     Endocrine   Hyperlipidemia associated with type 2 diabetes mellitus (HCC)    Chronic, stable goal of LDL <70 Repeat LP Currently on Crestor 20 mg      Relevant Medications   dapagliflozin propanediol (FARXIGA) 10 MG TABS tablet   lisinopril (ZESTRIL) 40 MG tablet   metoprolol succinate (TOPROL-XL)  100 MG 24 hr tablet   Other Relevant Orders   Comprehensive Metabolic Panel (CMET)   CBC   Lipid panel   Type 2 diabetes mellitus with neurological complications (HCC)    Chronic, hx of CVA with slight residual L deficit per pt report Repeat A1c Continue to recommend balanced, lower carb meals. Smaller meal size, adding snacks. Choosing water as drink of choice and increasing purposeful exercise. Continue Farxiga 10 mg  Continue Metformin XR 750 mg       Relevant Medications   dapagliflozin propanediol (FARXIGA) 10 MG TABS tablet   lisinopril (ZESTRIL) 40 MG tablet   Other Relevant Orders   Hemoglobin A1c     Other   High risk medication use    Currently on tramadol 50 mg BID; pt notes uncontrolled pain and has been using NSAIDS and APAP to assist Request to increase dose up to TID Willing to provide urine for drug testing       Relevant Orders   Drug Screen 12+Alcohol+CRT, Ur   Screening PSA (prostate specific antigen) - Primary    Denies LUTS; recommend PSA in place of DRE. If PSA is elevated for age, we will repeat; if PSA remains elevated pt will be referred to urology for DRE and next steps for best treatment.       Relevant Orders   PSA   Vitamin D deficiency    Previously low at 17; repeat labs. Goal >35      Relevant Orders   Vitamin D (25 hydroxy)   Return in about 6 months (around 09/07/2022), or if symptoms worsen or fail to improve.    Vonna Kotyk, FNP, have reviewed all documentation for this visit. The documentation on 03/08/22 for the exam, diagnosis, procedures, and orders are all accurate and complete.  Gwyneth Sprout, [place] [phone] (phone) [phone] (fax)  [place]

## 2022-03-08 NOTE — Assessment & Plan Note (Signed)
Continue Plavix 75 mg Recommend ASA 81 mg Repeat LP CMP CBC A1C

## 2022-03-10 ENCOUNTER — Other Ambulatory Visit: Payer: Self-pay | Admitting: Family Medicine

## 2022-03-10 DIAGNOSIS — E1149 Type 2 diabetes mellitus with other diabetic neurological complication: Secondary | ICD-10-CM

## 2022-03-10 MED ORDER — METFORMIN HCL ER 750 MG PO TB24: 750.0000 mg | ORAL_TABLET | Freq: Two times a day (BID) | ORAL | 0 refills | Status: DC

## 2022-03-10 MED ORDER — FENOFIBRATE 145 MG PO TABS: 145.0000 mg | ORAL_TABLET | Freq: Every day | ORAL | 2 refills | Status: DC

## 2022-03-10 NOTE — Progress Notes (Signed)
Diabetes is increased as you expected; now 7.6% Continue to recommend balanced, lower carb meals. Smaller meal size, adding snacks. Choosing water as drink of choice and increasing purposeful exercise. Recommend increase of metformin to twice a day. Fats elevated on non-fasting lipid panel; could start fibrate if desired. Recommend repeat A1c in 3 months-

## 2022-03-12 LAB — VITAMIN D 25 HYDROXY (VIT D DEFICIENCY, FRACTURES): Vit D, 25-Hydroxy: 33 ng/mL (ref 30.0–100.0)

## 2022-03-12 LAB — CBC
Hematocrit: 42.4 % (ref 37.5–51.0)
Hemoglobin: 13.8 g/dL (ref 13.0–17.7)
MCH: 28.8 pg (ref 26.6–33.0)
MCHC: 32.5 g/dL (ref 31.5–35.7)
MCV: 89 fL (ref 79–97)
Platelets: 265 10*3/uL (ref 150–450)
RBC: 4.79 x10E6/uL (ref 4.14–5.80)
RDW: 12.1 % (ref 11.6–15.4)
WBC: 9.2 10*3/uL (ref 3.4–10.8)

## 2022-03-12 LAB — COMPREHENSIVE METABOLIC PANEL
ALT: 16 IU/L (ref 0–44)
AST: 13 IU/L (ref 0–40)
Albumin/Globulin Ratio: 1.8 (ref 1.2–2.2)
Albumin: 4.3 g/dL (ref 3.8–4.9)
Alkaline Phosphatase: 70 IU/L (ref 44–121)
BUN/Creatinine Ratio: 19 (ref 9–20)
BUN: 23 mg/dL (ref 6–24)
Bilirubin Total: 0.3 mg/dL (ref 0.0–1.2)
CO2: 23 mmol/L (ref 20–29)
Calcium: 9.4 mg/dL (ref 8.7–10.2)
Chloride: 99 mmol/L (ref 96–106)
Creatinine, Ser: 1.18 mg/dL (ref 0.76–1.27)
Globulin, Total: 2.4 g/dL (ref 1.5–4.5)
Glucose: 154 mg/dL — ABNORMAL HIGH (ref 70–99)
Potassium: 4.7 mmol/L (ref 3.5–5.2)
Sodium: 138 mmol/L (ref 134–144)
Total Protein: 6.7 g/dL (ref 6.0–8.5)
eGFR: 71 mL/min/{1.73_m2} (ref >59)

## 2022-03-12 LAB — LIPID PANEL
Chol/HDL Ratio: 4.3 ratio (ref 0.0–5.0)
Cholesterol, Total: 116 mg/dL (ref 100–199)
HDL: 27 mg/dL — ABNORMAL LOW (ref >39)
LDL Chol Calc (NIH): 32 mg/dL (ref 0–99)
Triglycerides: 395 mg/dL — ABNORMAL HIGH (ref 0–149)
VLDL Cholesterol Cal: 57 mg/dL — ABNORMAL HIGH (ref 5–40)

## 2022-03-12 LAB — DRUG SCREEN 12+ALCOHOL+CRT, UR
Amphetamines, Urine: NEGATIVE ng/mL
BENZODIAZ UR QL: NEGATIVE ng/mL
Barbiturate: NEGATIVE ng/mL
Cannabinoids: NEGATIVE ng/mL
Cocaine (Metabolite): NEGATIVE ng/mL
Creatinine, Urine: 68.5 mg/dL (ref 20.0–300.0)
Ethanol, Urine: NEGATIVE %
Meperidine: NEGATIVE ng/mL
Methadone: NEGATIVE ng/mL
OPIATE SCREEN URINE: NEGATIVE ng/mL
Oxycodone/Oxymorphone, Urine: NEGATIVE ng/mL
Phencyclidine: NEGATIVE ng/mL
Propoxyphene: NEGATIVE ng/mL
Tramadol: POSITIVE — AB

## 2022-03-12 LAB — HEMOGLOBIN A1C
Est. average glucose Bld gHb Est-mCnc: 171 mg/dL
Hgb A1c MFr Bld: 7.6 % — ABNORMAL HIGH (ref 4.8–5.6)

## 2022-03-12 LAB — PSA: Prostate Specific Ag, Serum: 0.7 ng/mL (ref 0.0–4.0)

## 2022-03-20 ENCOUNTER — Ambulatory Visit: Payer: 59 | Admitting: Family Medicine

## 2022-03-20 ENCOUNTER — Ambulatory Visit: Payer: Self-pay | Admitting: *Deleted

## 2022-03-20 ENCOUNTER — Encounter: Payer: Self-pay | Admitting: Family Medicine

## 2022-03-20 VITALS — BP 115/69 | HR 80 | Temp 98.0°F | Wt 283.0 lb

## 2022-03-20 DIAGNOSIS — Z20828 Contact with and (suspected) exposure to other viral communicable diseases: Secondary | ICD-10-CM | POA: Insufficient documentation

## 2022-03-20 DIAGNOSIS — J069 Acute upper respiratory infection, unspecified: Secondary | ICD-10-CM | POA: Insufficient documentation

## 2022-03-20 MED ORDER — HYDROCOD POLI-CHLORPHE POLI ER 10-8 MG/5ML PO SUER: 5.0000 mL | Freq: Two times a day (BID) | ORAL | 0 refills | Status: DC | PRN

## 2022-03-20 MED ORDER — PREDNISONE 50 MG PO TABS: 50.0000 mg | ORAL_TABLET | Freq: Every day | ORAL | 0 refills | Status: DC

## 2022-03-20 MED ORDER — BENZONATATE 200 MG PO CAPS: 200.0000 mg | ORAL_CAPSULE | Freq: Three times a day (TID) | ORAL | 1 refills | Status: DC

## 2022-03-20 NOTE — Telephone Encounter (Signed)
Summary: RSV advice   Pts mother has RSV and now pt is showing symptoms / pt would like advise on if he needs testing and when he can return to work / please advise     Reason for Disposition . [1] MILD difficulty breathing (e.g., minimal/no SOB at rest, SOB with walking, pulse <100) AND [2] still present when not coughing  Answer Assessment - Initial Assessment Questions 1. ONSET: "When did the cough begin?"      *No Answer* 2. SEVERITY: "How bad is the cough today?"      Cough, SOB 3. SPUTUM: "Describe the color of your sputum" (none, dry cough; clear, white, yellow, green)     Not seeing 4. HEMOPTYSIS: "Are you coughing up any blood?" If so ask: "How much?" (flecks, streaks, tablespoons, etc.)     na 5. DIFFICULTY BREATHING: "Are you having difficulty breathing?" If Yes, ask: "How bad is it?" (e.g., mild, moderate, severe)    - MILD: No SOB at rest, mild SOB with walking, speaks normally in sentences, can lie down, no retractions, pulse < 100.    - MODERATE: SOB at rest, SOB with minimal exertion and prefers to sit, cannot lie down flat, speaks in phrases, mild retractions, audible wheezing, pulse 100-120.    - SEVERE: Very SOB at rest, speaks in single words, struggling to breathe, sitting hunched forward, retractions, pulse > 120      Every breath - more shallow  Protocols used: Cough - Acute Productive-A-AH

## 2022-03-20 NOTE — Assessment & Plan Note (Signed)
Acute, stable Known exposure to RSV x2 days ago Cough noted; clear sputum No advantageous breath sounds Request Rx treatment to assist supportive care medication OTC RTC PRN

## 2022-03-20 NOTE — Telephone Encounter (Signed)
  Chief Complaint: exposure to RSV- symptoms starting Symptoms: cough, mucus, SOB Frequency:  not asked Pertinent Negatives: Patient denies Disposition: '[]'$ ED /'[]'$ Urgent Care (no appt availability in office) / '[x]'$ Appointment(In office/virtual)/ '[]'$  [place] [facility]/ '[]'$ Home Care/ '[]'$ Refused Recommended Disposition /'[]'$ [facility]/ '[]'$  Follow-up with PCP Additional Notes: Patient has increasing symptoms- advised needs appointment

## 2022-03-20 NOTE — Progress Notes (Signed)
I,Connie R Striblin,acting as a Education administrator for Gwyneth Sprout, FNP.,have documented all relevant documentation on the behalf of Gwyneth Sprout, FNP,as directed by  Gwyneth Sprout, FNP while in the presence of Gwyneth Sprout, FNP.  Established patient visit   Patient: Nathan Hull   DOB: Jul 06, 1960   61 y.o. Male  MRN: [id number] Visit Date: 03/20/2022  Today's healthcare provider: Gwyneth Sprout, FNP  Re Introduced to nurse practitioner role and practice setting.  All questions answered.  Discussed provider/patient relationship and expectations.  CC: Exposure to RSV  Subjective    HPI  Cough: Patient complains of cough. Symptoms began 2 day ago. Cough described as, productive of clear sputum. Patient denies chills and fever. Associated symptoms include dyspnea, nasal congestion, sneezing, and sore throat. . Current treatments have included acetaminophen, with fair improvement.   Pt states that he's been exposed to RSV from his daughter and would like to know when he will not be contagious    Medications: Outpatient Medications Prior to Visit  Medication Sig   Blood Glucose Monitoring Suppl (ONE TOUCH ULTRA MINI) w/Device KIT USE AS DIRECTED   clopidogrel (PLAVIX) 75 MG tablet Take 1 tablet (75 mg total) by mouth daily.   dapagliflozin propanediol (FARXIGA) 10 MG TABS tablet Take 1 tablet (10 mg total) by mouth daily.   fenofibrate (TRICOR) 145 MG tablet Take 1 tablet (145 mg total) by mouth daily.   lisinopril (ZESTRIL) 40 MG tablet Take 1 tablet (40 mg total) by mouth daily.   metFORMIN (GLUCOPHAGE-XR) 750 MG 24 hr tablet Take 1 tablet (750 mg total) by mouth 2 (two) times daily before a meal.   metoprolol succinate (TOPROL-XL) 100 MG 24 hr tablet Take 1 tablet (100 mg total) by mouth daily. Take with or immediately following a meal.   ONE TOUCH ULTRA TEST test strip USE AS DIRECTED   ONETOUCH DELICA LANCETS FINE MISC See admin instructions.   rosuvastatin (CRESTOR) 20 MG tablet TAKE  1 TABLET BY MOUTH EVERY DAY   traMADol (ULTRAM) 50 MG tablet TAKE 1 TABLET BY MOUTH TWICE A DAY   Vitamin D, Ergocalciferol, (DRISDOL) 1.25 MG (50000 UNIT) CAPS capsule TAKE 1 CAPSULE (50,000 UNITS TOTAL) BY MOUTH EVERY 7 (SEVEN) DAYS   No facility-administered medications prior to visit.    Review of Systems    Objective    BP 115/69 (BP Location: Left Arm, Patient Position: Sitting, Cuff Size: Normal)   Pulse 80   Temp 98 F (36.7 C) (Oral)   Wt 283 lb (128.4 kg)   SpO2 99%   BMI 38.38 kg/m   Physical Exam Vitals and nursing note reviewed.  Constitutional:      General: He is not in acute distress.    Appearance: Normal appearance. He is obese. He is not ill-appearing or toxic-appearing.  HENT:     Head: Normocephalic and atraumatic.     Nose: Nose normal.  Eyes:     Pupils: Pupils are equal, round, and reactive to light.  Cardiovascular:     Rate and Rhythm: Normal rate and regular rhythm.     Pulses: Normal pulses.     Heart sounds: Normal heart sounds.  Pulmonary:     Effort: Pulmonary effort is normal. No respiratory distress.     Breath sounds: Normal breath sounds. No stridor. No wheezing, rhonchi or rales.  Chest:     Chest wall: No tenderness.  Musculoskeletal:  General: Normal range of motion.     Cervical back: Normal range of motion.  Skin:    General: Skin is warm and dry.     Capillary Refill: Capillary refill takes less than 2 seconds.  Neurological:     General: No focal deficit present.     Mental Status: He is alert and oriented to person, place, and time. Mental status is at baseline.  Psychiatric:        Mood and Affect: Mood normal.        Behavior: Behavior normal.        Thought Content: Thought content normal.        Judgment: Judgment normal.     No results found for any visits on 03/20/22.  Assessment & Plan     Problem List Items Addressed This Visit       Respiratory   Viral URI with cough - Primary    Acute,  stable Known exposure to RSV x2 days ago Cough noted; clear sputum No advantageous breath sounds Request Rx treatment to assist supportive care medication OTC RTC PRN        Other   Exposure to respiratory syncytial virus (RSV)    Return if symptoms worsen or fail to improve.     Vonna Kotyk, FNP, have reviewed all documentation for this visit. The documentation on 03/20/22 for the exam, diagnosis, procedures, and orders are all accurate and complete.  Gwyneth Sprout, [place] [phone] (phone) [phone] (fax)  [place]

## 2022-03-31 ENCOUNTER — Other Ambulatory Visit: Payer: Self-pay | Admitting: Physician Assistant

## 2022-03-31 DIAGNOSIS — M542 Cervicalgia: Secondary | ICD-10-CM

## 2022-03-31 DIAGNOSIS — Z981 Arthrodesis status: Secondary | ICD-10-CM

## 2022-04-02 NOTE — Telephone Encounter (Signed)
Requested medication (s) are due for refill today: routing for approval  Requested medication (s) are on the active medication list:yes  Last refill:  03/01/22  Future visit scheduled:yes  Notes to clinic:  Unable to refill per protocol, cannot delegate.      Requested Prescriptions  Pending Prescriptions Disp Refills   traMADol (ULTRAM) 50 MG tablet [Pharmacy Med Name: TRAMADOL HCL 50 MG TABLET] 60 tablet 0    Sig: TAKE 1 TABLET BY MOUTH TWICE A DAY     Not Delegated - Analgesics:  Opioid Agonists Failed - 03/31/2022 11:46 AM      Failed - This refill cannot be delegated      Failed - Urine Drug Screen completed in last 360 days      Passed - Valid encounter within last 3 months    Recent Outpatient Visits           1 week ago Viral URI with cough   [facility] Gwyneth Sprout, FNP   3 weeks ago Annual physical exam   [facility] Tally Joe T, FNP   5 months ago 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

## 2022-04-03 ENCOUNTER — Other Ambulatory Visit: Payer: Self-pay | Admitting: Family Medicine

## 2022-04-03 DIAGNOSIS — Z981 Arthrodesis status: Secondary | ICD-10-CM

## 2022-04-03 DIAGNOSIS — M542 Cervicalgia: Secondary | ICD-10-CM

## 2022-04-03 MED ORDER — TRAMADOL HCL 50 MG PO TABS: 50.0000 mg | ORAL_TABLET | Freq: Three times a day (TID) | ORAL | 5 refills | Status: DC | PRN

## 2022-04-25 ENCOUNTER — Other Ambulatory Visit: Payer: Self-pay | Admitting: Family Medicine

## 2022-04-25 DIAGNOSIS — E782 Mixed hyperlipidemia: Secondary | ICD-10-CM

## 2022-06-14 ENCOUNTER — Other Ambulatory Visit: Payer: Self-pay | Admitting: Family Medicine

## 2022-06-14 NOTE — Telephone Encounter (Signed)
Requested Prescriptions  Pending Prescriptions Disp Refills   fenofibrate (TRICOR) 145 MG tablet [Pharmacy Med Name: FENOFIBRATE 145 MG TABLET] 90 tablet 0    Sig: TAKE 1 TABLET BY MOUTH EVERY DAY     Cardiovascular:  Antilipid - Fibric Acid Derivatives Failed - 06/14/2022  1:51 AM      Failed - Lipid Panel in normal range within the last 12 months    Cholesterol, Total  Date Value Ref Range Status  03/08/2022 116 100 - 199 mg/dL Final   LDL Chol Calc (NIH)  Date Value Ref Range Status  03/08/2022 32 0 - 99 mg/dL Final   HDL  Date Value Ref Range Status  03/08/2022 27 (L) >39 mg/dL Final   Triglycerides  Date Value Ref Range Status  03/08/2022 395 (H) 0 - 149 mg/dL Final         Passed - ALT in normal range and within 360 days    ALT  Date Value Ref Range Status  03/08/2022 16 0 - 44 IU/L Final         Passed - AST in normal range and within 360 days    AST  Date Value Ref Range Status  03/08/2022 13 0 - 40 IU/L Final         Passed - Cr in normal range and within 360 days    Creatinine, Ser  Date Value Ref Range Status  03/08/2022 1.18 0.76 - 1.27 mg/dL Final         Passed - HGB in normal range and within 360 days    Hemoglobin  Date Value Ref Range Status  03/08/2022 13.8 13.0 - 17.7 g/dL Final         Passed - HCT in normal range and within 360 days    Hematocrit  Date Value Ref Range Status  03/08/2022 42.4 37.5 - 51.0 % Final         Passed - PLT in normal range and within 360 days    Platelets  Date Value Ref Range Status  03/08/2022 265 150 - 450 x10E3/uL Final         Passed - WBC in normal range and within 360 days    WBC  Date Value Ref Range Status  03/08/2022 9.2 3.4 - 10.8 x10E3/uL Final  12/27/2020 7.3 4.0 - 10.5 K/uL Final         Passed - eGFR is 30 or above and within 360 days    GFR calc Af Amer  Date Value Ref Range Status  02/04/2020 110 >59 mL/min/1.73 Final    Comment:    **In accordance with recommendations from the  NKF-ASN Task force,**   [facility] is in the process of updating its eGFR calculation to the   2021 CKD-EPI creatinine equation that estimates kidney function   without a race variable.    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

## 2022-06-25 ENCOUNTER — Other Ambulatory Visit: Payer: Self-pay | Admitting: Family Medicine

## 2022-09-04 ENCOUNTER — Encounter: Payer: Self-pay | Admitting: Family Medicine

## 2022-09-04 ENCOUNTER — Ambulatory Visit (INDEPENDENT_AMBULATORY_CARE_PROVIDER_SITE_OTHER): Payer: 59 | Admitting: Family Medicine

## 2022-09-04 VITALS — BP 117/79 | HR 80 | Ht 72.0 in | Wt 283.0 lb

## 2022-09-04 DIAGNOSIS — E1149 Type 2 diabetes mellitus with other diabetic neurological complication: Secondary | ICD-10-CM

## 2022-09-04 DIAGNOSIS — E785 Hyperlipidemia, unspecified: Secondary | ICD-10-CM

## 2022-09-04 DIAGNOSIS — M542 Cervicalgia: Secondary | ICD-10-CM | POA: Diagnosis not present

## 2022-09-04 DIAGNOSIS — E1169 Type 2 diabetes mellitus with other specified complication: Secondary | ICD-10-CM | POA: Diagnosis not present

## 2022-09-04 DIAGNOSIS — E1159 Type 2 diabetes mellitus with other circulatory complications: Secondary | ICD-10-CM | POA: Diagnosis not present

## 2022-09-04 DIAGNOSIS — Z981 Arthrodesis status: Secondary | ICD-10-CM

## 2022-09-04 DIAGNOSIS — I152 Hypertension secondary to endocrine disorders: Secondary | ICD-10-CM

## 2022-09-04 MED ORDER — TRAMADOL HCL 50 MG PO TABS: 50.0000 mg | ORAL_TABLET | Freq: Three times a day (TID) | ORAL | 5 refills | Status: DC | PRN

## 2022-09-04 NOTE — Assessment & Plan Note (Signed)
Chronic, stable  Continue tramadol as ordered

## 2022-09-04 NOTE — Assessment & Plan Note (Signed)
Chronic, stable Repeat A1c Continue farxiga 10 mg  Metformin 750 mg; has not taken it BID as prescribed

## 2022-09-04 NOTE — Assessment & Plan Note (Signed)
Chronic, previous stable LDL Elevated trigs Repeat LP recommend diet low in saturated fat and regular exercise - 30 min at least 5 times per week

## 2022-09-04 NOTE — Progress Notes (Signed)
Established patient visit   Patient: Nathan Hull   DOB: 03/01/1961   62 y.o. Male  MRN: [id number] Visit Date: 09/04/2022  Today's healthcare provider: Jacky Kindle, FNP  Re Introduced to nurse practitioner role and practice setting.  All questions answered.  Discussed provider/patient relationship and expectations.  Chief Complaint  Patient presents with   Follow-up   Subjective    HPI  Diabetes Mellitus Type II, follow-up  Lab Results  Component Value Date   HGBA1C 7.6 (H) 03/08/2022   HGBA1C 7.3 (A) 10/12/2021   HGBA1C 7.4 (H) 07/13/2021   Last seen for diabetes 6 months ago.  Management since then includes continuing the same treatment. Continue farxiga 10 mg and metformin xr 750 mg.  He reports fair compliance with treatment. He is not having side effects.  Home blood sugar records: not checked  Episodes of hypoglycemia? No    Current insulin regiment: none Most Recent Eye Exam: utd  --------------------------------------------------------------------------------------------------- Hypertension, follow-up  BP Readings from Last 3 Encounters:  09/04/22 117/79  03/20/22 115/69  03/08/22 105/78   Wt Readings from Last 3 Encounters:  09/04/22 283 lb (128.4 kg)  03/20/22 283 lb (128.4 kg)  03/08/22 282 lb 6.4 oz (128.1 kg)     He was last seen for hypertension 6 months ago.  BP at that visit was 105/78. Management since that visit includes no changes. He reports excellent compliance with treatment. He is not having side effects.   Outside blood pressures are not being checked --------------------------------------------------------------------------------------------------- Lipid/Cholesterol, follow-up  Last Lipid Panel: Lab Results  Component Value Date   CHOL 116 03/08/2022   LDLCALC 32 03/08/2022   HDL 27 (L) 03/08/2022   TRIG 395 (H) 03/08/2022    He was last seen for this 6 months ago.  Management since that visit includes no  changes.  He reports fair compliance with treatment. He is not having side effects.   Last metabolic panel Lab Results  Component Value Date   GLUCOSE 154 (H) 03/08/2022   NA 138 03/08/2022   K 4.7 03/08/2022   BUN 23 03/08/2022   CREATININE 1.18 03/08/2022   EGFR 71 03/08/2022   GFRNONAA >60 12/27/2020   CALCIUM 9.4 03/08/2022   AST 13 03/08/2022   ALT 16 03/08/2022   The ASCVD Risk score (Arnett DK, et al., 2019) failed to calculate for the following reasons:   The valid total cholesterol range is 130 to 320 mg/dL  --------------------------------------------------------------------------------------------------- Follow up for high risk medication use  The patient was last seen for this 6 months ago. Changes made at last visit include increase tramadol 50 mg from BID to TID.  He reports fair compliance with treatment. He feels that condition is Improved. He is not having side effects.  -----------------------------------------------------------------------------------------  Medications: Outpatient Medications Prior to Visit  Medication Sig   Blood Glucose Monitoring Suppl (ONE TOUCH ULTRA MINI) w/Device KIT USE AS DIRECTED   chlorpheniramine-HYDROcodone (TUSSIONEX) 10-8 MG/5ML Take 5 mLs by mouth every 12 (twelve) hours as needed for cough.   clopidogrel (PLAVIX) 75 MG tablet Take 1 tablet (75 mg total) by mouth daily.   dapagliflozin propanediol (FARXIGA) 10 MG TABS tablet Take 1 tablet (10 mg total) by mouth daily.   fenofibrate (TRICOR) 145 MG tablet TAKE 1 TABLET BY MOUTH EVERY DAY   lisinopril (ZESTRIL) 40 MG tablet Take 1 tablet (40 mg total) by mouth daily.   metFORMIN (GLUCOPHAGE-XR) 750 MG 24 hr tablet TAKE 1 TABLET (  750 MG TOTAL) BY MOUTH 2 (TWO) TIMES DAILY BEFORE A MEAL.   metoprolol succinate (TOPROL-XL) 100 MG 24 hr tablet Take 1 tablet (100 mg total) by mouth daily. Take with or immediately following a meal.   ONE TOUCH ULTRA TEST test strip USE AS  DIRECTED   ONETOUCH DELICA LANCETS FINE MISC See admin instructions.   rosuvastatin (CRESTOR) 20 MG tablet TAKE 1 TABLET BY MOUTH EVERY DAY   Vitamin D, Ergocalciferol, (DRISDOL) 1.25 MG (50000 UNIT) CAPS capsule TAKE 1 CAPSULE (50,000 UNITS TOTAL) BY MOUTH EVERY 7 (SEVEN) DAYS   [DISCONTINUED] traMADol (ULTRAM) 50 MG tablet Take 1 tablet (50 mg total) by mouth 3 (three) times daily as needed for severe pain.   [DISCONTINUED] benzonatate (TESSALON) 200 MG capsule Take 1 capsule (200 mg total) by mouth in the morning, at noon, and at bedtime. Take to assist with cough   [DISCONTINUED] predniSONE (DELTASONE) 50 MG tablet Take 1 tablet (50 mg total) by mouth daily with breakfast.   No facility-administered medications prior to visit.    Review of Systems    Objective    BP 117/79 (BP Location: Left Arm, Patient Position: Sitting, Cuff Size: Normal)   Pulse 80   Ht 6' (1.829 m)   Wt 283 lb (128.4 kg)   SpO2 98%   BMI 38.38 kg/m   Physical Exam Vitals and nursing note reviewed.  Constitutional:      Appearance: Normal appearance. He is obese.  HENT:     Head: Normocephalic and atraumatic.  Cardiovascular:     Rate and Rhythm: Normal rate and regular rhythm.     Pulses: Normal pulses.     Heart sounds: Normal heart sounds.  Pulmonary:     Effort: Pulmonary effort is normal.     Breath sounds: Normal breath sounds.  Musculoskeletal:        General: Normal range of motion.     Cervical back: Normal range of motion.  Skin:    General: Skin is warm and dry.     Capillary Refill: Capillary refill takes less than 2 seconds.  Neurological:     General: No focal deficit present.     Mental Status: He is alert and oriented to person, place, and time. Mental status is at baseline.  Psychiatric:        Mood and Affect: Mood normal.        Behavior: Behavior normal.        Thought Content: Thought content normal.        Judgment: Judgment normal.     No results found for any visits  on 09/04/22.  Assessment & Plan     Problem List Items Addressed This Visit       Cardiovascular and Mediastinum   Hypertension associated with diabetes (HCC)    Chronic, stable Continue farxiga 10, lisinopril 40, toprol 100 mg       Relevant Orders   Basic Metabolic Panel (BMET)   CBC   Hemoglobin A1c   TSH   Urine Microalbumin w/creat. ratio     Endocrine   Hyperlipidemia associated with type 2 diabetes mellitus (HCC)    Chronic, previous stable LDL Elevated trigs Repeat LP recommend diet low in saturated fat and regular exercise - 30 min at least 5 times per week       Relevant Orders   Lipid panel   Hemoglobin A1c   Urine Microalbumin w/creat. ratio   Type 2 diabetes mellitus with neurological complications (HCC) -  Primary    Chronic, stable Repeat A1c Continue farxiga 10 mg  Metformin 750 mg; has not taken it BID as prescribed       Relevant Orders   Lipid panel   Basic Metabolic Panel (BMET)   CBC   Hemoglobin A1c   TSH   Urine Microalbumin w/creat. ratio     Other   Neck pain    Chronic, stable  Continue tramadol as ordered      Relevant Medications   traMADol (ULTRAM) 50 MG tablet   S/P cervical spinal fusion    Chronic, stable  Continue tramadol as ordered      Relevant Medications   traMADol (ULTRAM) 50 MG tablet   Return in about 3 months (around 12/05/2022) for chonic disease management.     Leilani Merl, FNP, have reviewed all documentation for this visit. The documentation on 09/04/22 for the exam, diagnosis, procedures, and orders are all accurate and complete.  Jacky Kindle, FNP  [facility] [phone] (phone) [phone] (fax)  [facility]

## 2022-09-04 NOTE — Patient Instructions (Signed)
The CDC recommends two doses of Shingrix (the new shingles vaccine) separated by 2 to 6 months for adults age 60 years and older. I recommend checking with your insurance plan regarding coverage for this vaccine.    

## 2022-09-04 NOTE — Assessment & Plan Note (Signed)
Chronic, stable  Continue tramadol as ordered 

## 2022-09-04 NOTE — Assessment & Plan Note (Signed)
Chronic, stable Continue farxiga 10, lisinopril 40, toprol 100 mg

## 2022-09-05 LAB — LIPID PANEL
Chol/HDL Ratio: 4.5 ratio (ref 0.0–5.0)
Cholesterol, Total: 121 mg/dL (ref 100–199)
HDL: 27 mg/dL — ABNORMAL LOW (ref >39)
LDL Chol Calc (NIH): 37 mg/dL (ref 0–99)
Triglycerides: 395 mg/dL — ABNORMAL HIGH (ref 0–149)
VLDL Cholesterol Cal: 57 mg/dL — ABNORMAL HIGH (ref 5–40)

## 2022-09-05 LAB — CBC
Hematocrit: 41.5 % (ref 37.5–51.0)
Hemoglobin: 14 g/dL (ref 13.0–17.7)
MCH: 28.8 pg (ref 26.6–33.0)
MCHC: 33.7 g/dL (ref 31.5–35.7)
MCV: 85 fL (ref 79–97)
Platelets: 269 10*3/uL (ref 150–450)
RBC: 4.86 x10E6/uL (ref 4.14–5.80)
RDW: 12.3 % (ref 11.6–15.4)
WBC: 8.4 10*3/uL (ref 3.4–10.8)

## 2022-09-05 LAB — MICROALBUMIN / CREATININE URINE RATIO
Creatinine, Urine: 63.3 mg/dL
Microalb/Creat Ratio: <5 mg/g creat (ref 0–29)
Microalbumin, Urine: <3 ug/mL

## 2022-09-05 LAB — BASIC METABOLIC PANEL
BUN/Creatinine Ratio: 18 (ref 10–24)
BUN: 22 mg/dL (ref 8–27)
CO2: 23 mmol/L (ref 20–29)
Calcium: 9.8 mg/dL (ref 8.6–10.2)
Chloride: 100 mmol/L (ref 96–106)
Creatinine, Ser: 1.22 mg/dL (ref 0.76–1.27)
Glucose: 125 mg/dL — ABNORMAL HIGH (ref 70–99)
Potassium: 4.8 mmol/L (ref 3.5–5.2)
Sodium: 139 mmol/L (ref 134–144)
eGFR: 68 mL/min/{1.73_m2} (ref >59)

## 2022-09-05 LAB — TSH: TSH: 1.56 u[IU]/mL (ref 0.450–4.500)

## 2022-09-05 LAB — HEMOGLOBIN A1C
Est. average glucose Bld gHb Est-mCnc: 180 mg/dL
Hgb A1c MFr Bld: 7.9 % — ABNORMAL HIGH (ref 4.8–5.6)

## 2022-09-05 NOTE — Progress Notes (Signed)
Continue to work on recommend diet low in saturated fat and regular exercise - 30 min at least 5 times per week. Continue tricor for elevated fats noted in diet. A1c is slightly worse; from 7.6-7.9%. Continue to recommend balanced, lower carb meals. Smaller meal size, adding snacks. Choosing water as drink of choice and increasing purposeful exercise. Increase metformin as discussed to match Rx recs for 1500 mg/day. Urine pending.

## 2022-09-05 NOTE — Progress Notes (Signed)
Urine micro is improved. Repeat in 1 year.

## 2022-09-14 ENCOUNTER — Other Ambulatory Visit: Payer: Self-pay | Admitting: Family Medicine

## 2022-09-14 DIAGNOSIS — E782 Mixed hyperlipidemia: Secondary | ICD-10-CM

## 2022-09-16 NOTE — Telephone Encounter (Signed)
Requested Prescriptions  Pending Prescriptions Disp Refills   rosuvastatin (CRESTOR) 20 MG tablet [Pharmacy Med Name: ROSUVASTATIN CALCIUM 20 MG TAB] 90 tablet 0    Sig: TAKE 1 TABLET BY MOUTH EVERY DAY     Cardiovascular:  Antilipid - Statins 2 Failed - 09/14/2022  8:55 AM      Failed - Lipid Panel in normal range within the last 12 months    Cholesterol, Total  Date Value Ref Range Status  09/04/2022 121 100 - 199 mg/dL Final   LDL Chol Calc (NIH)  Date Value Ref Range Status  09/04/2022 37 0 - 99 mg/dL Final   HDL  Date Value Ref Range Status  09/04/2022 27 (L) >39 mg/dL Final   Triglycerides  Date Value Ref Range Status  09/04/2022 395 (H) 0 - 149 mg/dL Final         Passed - Cr in normal range and within 360 days    Creatinine, Ser  Date Value Ref Range Status  09/04/2022 1.22 0.76 - 1.27 mg/dL Final         Passed - Patient is not pregnant      Passed - Valid encounter within last 12 months    Recent Outpatient Visits           1 week ago 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

## 2022-09-20 ENCOUNTER — Other Ambulatory Visit: Payer: Self-pay | Admitting: Family Medicine

## 2022-09-20 DIAGNOSIS — E1149 Type 2 diabetes mellitus with other diabetic neurological complication: Secondary | ICD-10-CM

## 2022-10-01 ENCOUNTER — Other Ambulatory Visit: Payer: Self-pay | Admitting: Family Medicine

## 2022-10-01 DIAGNOSIS — Z981 Arthrodesis status: Secondary | ICD-10-CM

## 2022-10-01 DIAGNOSIS — M542 Cervicalgia: Secondary | ICD-10-CM

## 2022-10-01 DIAGNOSIS — E782 Mixed hyperlipidemia: Secondary | ICD-10-CM

## 2022-10-29 NOTE — Telephone Encounter (Signed)
error 

## 2022-10-31 ENCOUNTER — Other Ambulatory Visit: Payer: Self-pay | Admitting: Family Medicine

## 2022-10-31 DIAGNOSIS — M542 Cervicalgia: Secondary | ICD-10-CM

## 2022-10-31 DIAGNOSIS — Z981 Arthrodesis status: Secondary | ICD-10-CM

## 2022-10-31 NOTE — Telephone Encounter (Signed)
Requested medications are due for refill today.  yes  Requested medications are on the active medications list.  yes  Last refill. 10/01/2022 #90 0 rf  Future visit scheduled.   no  Notes to clinic.  Refill not delegated.    Requested Prescriptions  Pending Prescriptions Disp Refills   traMADol (ULTRAM) 50 MG tablet [Pharmacy Med Name: TRAMADOL HCL 50 MG TABLET] 90 tablet     Sig: TAKE 1 TABLET (50 MG TOTAL) BY MOUTH 3 (THREE) TIMES DAILY AS NEEDED FOR SEVERE PAIN.     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[facility])   [place] [facility] Merita Norton T, FNP   7 months ago Viral URI with cough   [facility] Jacky Kindle, FNP   7 months ago Annual physical exam   [facility] Merita Norton T, FNP   1 year ago 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

## 2022-11-04 ENCOUNTER — Other Ambulatory Visit: Payer: Self-pay

## 2023-02-28 ENCOUNTER — Other Ambulatory Visit: Payer: Self-pay | Admitting: Family Medicine

## 2023-02-28 DIAGNOSIS — E559 Vitamin D deficiency, unspecified: Secondary | ICD-10-CM

## 2023-03-03 NOTE — Telephone Encounter (Signed)
Requested medication (s) are due for refill today: Yes  Requested medication (s) are on the active medication list: Yes  Last refill:  01/28/22  Future visit scheduled: Yes  Notes to clinic:  Manual review.    Requested Prescriptions  Pending Prescriptions Disp Refills   Vitamin D, Ergocalciferol, (DRISDOL) 1.25 MG (50000 UNIT) CAPS capsule [Pharmacy Med Name: VITAMIN D2 1.25MG (50,000 UNIT)] 13 capsule 3    Sig: TAKE 1 CAPSULE (50,000 UNITS TOTAL) BY MOUTH EVERY 7 (SEVEN) DAYS     Endocrinology:  Vitamins - Vitamin D Supplementation 2 Failed - 02/28/2023  1:38 AM      Failed - Manual Review: Route requests for 50,000 IU strength to the provider      Passed - Ca in normal range and within 360 days    Calcium  Date Value Ref Range Status  09/04/2022 9.8 8.6 - 10.2 mg/dL Final   Calcium, Ion  Date Value Ref Range Status  09/25/2013 1.12 1.12 - 1.23 mmol/L Final         Passed - Vitamin D in normal range and within 360 days    Vit D, 25-Hydroxy  Date Value Ref Range Status  03/08/2022 33.0 30.0 - 100.0 ng/mL Final    Comment:    Vitamin D deficiency has been defined by the Institute of Medicine and an Endocrine Society practice guideline as a level of serum 25-OH vitamin D less than 20 ng/mL (1,2). The Endocrine Society went on to further define vitamin D insufficiency as a level between 21 and 29 ng/mL (2). 1. IOM (Institute of Medicine). 2010. Dietary reference    intakes for calcium and D. [place]: The    [employer]. 2. Holick MF, Binkley Eugenio Saenz, Bischoff-Ferrari HA, et al.    Evaluation, treatment, and prevention of vitamin D    deficiency: an Endocrine Society clinical practice    guideline. JCEM. 2011 Jul; 96(7):1911-30.          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

## 2023-03-05 ENCOUNTER — Other Ambulatory Visit: Payer: Self-pay | Admitting: Family Medicine

## 2023-03-05 DIAGNOSIS — Z981 Arthrodesis status: Secondary | ICD-10-CM

## 2023-03-05 DIAGNOSIS — M542 Cervicalgia: Secondary | ICD-10-CM

## 2023-03-05 NOTE — Telephone Encounter (Signed)
Please advise 

## 2023-03-07 ENCOUNTER — Other Ambulatory Visit: Payer: Self-pay | Admitting: Family Medicine

## 2023-03-07 DIAGNOSIS — E782 Mixed hyperlipidemia: Secondary | ICD-10-CM

## 2023-03-07 DIAGNOSIS — E1159 Type 2 diabetes mellitus with other circulatory complications: Secondary | ICD-10-CM

## 2023-03-07 DIAGNOSIS — I25810 Atherosclerosis of coronary artery bypass graft(s) without angina pectoris: Secondary | ICD-10-CM

## 2023-03-12 ENCOUNTER — Ambulatory Visit: Payer: 59 | Admitting: Family Medicine

## 2023-03-12 ENCOUNTER — Encounter: Payer: Self-pay | Admitting: Family Medicine

## 2023-03-12 VITALS — BP 124/69 | HR 74 | Ht 72.0 in | Wt 283.0 lb

## 2023-03-12 DIAGNOSIS — K2100 Gastro-esophageal reflux disease with esophagitis, without bleeding: Secondary | ICD-10-CM | POA: Insufficient documentation

## 2023-03-12 DIAGNOSIS — R0789 Other chest pain: Secondary | ICD-10-CM | POA: Diagnosis not present

## 2023-03-12 DIAGNOSIS — E1149 Type 2 diabetes mellitus with other diabetic neurological complication: Secondary | ICD-10-CM

## 2023-03-12 DIAGNOSIS — R231 Pallor: Secondary | ICD-10-CM | POA: Diagnosis not present

## 2023-03-12 DIAGNOSIS — Z0001 Encounter for general adult medical examination with abnormal findings: Secondary | ICD-10-CM | POA: Diagnosis not present

## 2023-03-12 DIAGNOSIS — I25111 Atherosclerotic heart disease of native coronary artery with angina pectoris with documented spasm: Secondary | ICD-10-CM

## 2023-03-12 DIAGNOSIS — Z125 Encounter for screening for malignant neoplasm of prostate: Secondary | ICD-10-CM

## 2023-03-12 DIAGNOSIS — E1169 Type 2 diabetes mellitus with other specified complication: Secondary | ICD-10-CM

## 2023-03-12 DIAGNOSIS — E1159 Type 2 diabetes mellitus with other circulatory complications: Secondary | ICD-10-CM

## 2023-03-12 DIAGNOSIS — Z0000 Encounter for general adult medical examination without abnormal findings: Secondary | ICD-10-CM | POA: Insufficient documentation

## 2023-03-12 DIAGNOSIS — I152 Hypertension secondary to endocrine disorders: Secondary | ICD-10-CM

## 2023-03-12 DIAGNOSIS — E559 Vitamin D deficiency, unspecified: Secondary | ICD-10-CM

## 2023-03-12 DIAGNOSIS — E785 Hyperlipidemia, unspecified: Secondary | ICD-10-CM

## 2023-03-12 LAB — GLUCOSE, POCT (MANUAL RESULT ENTRY): POC Glucose: 142 mg/dL — AB (ref 70–99)

## 2023-03-12 MED ORDER — RIVAROXABAN 20 MG PO TABS: 20.0000 mg | ORAL_TABLET | Freq: Every day | ORAL | Status: DC

## 2023-03-12 MED ORDER — DILTIAZEM HCL 30 MG PO TABS: 30.0000 mg | ORAL_TABLET | Freq: Four times a day (QID) | ORAL | 0 refills | Status: DC

## 2023-03-12 NOTE — Assessment & Plan Note (Signed)
EKG and POC BG completed

## 2023-03-12 NOTE — Assessment & Plan Note (Signed)
Acute on chronic, flared x 2 months Recommend restablish with cardiology

## 2023-03-12 NOTE — Assessment & Plan Note (Signed)
Chronic, repeat labs

## 2023-03-12 NOTE — Assessment & Plan Note (Signed)
Acute, x 2 months with associated CP following meals of any size Reports relief at 0400 daily Recommend H Pylori breast test

## 2023-03-12 NOTE — Assessment & Plan Note (Signed)
Acute on chronic, with associated clamminess POC BG completed- normal reports last meal 3 hrs ago EKG shows RC Afib, with new IVCD

## 2023-03-12 NOTE — Progress Notes (Signed)
Complete physical exam   Patient: Nathan Hull   DOB: September 01, 1960   62 y.o. Male  MRN: [id number] Visit Date: 03/12/2023  Today's healthcare provider: Jacky Kindle, FNP  Re Introduced to nurse practitioner role and practice setting.  All questions answered.  Discussed provider/patient relationship and expectations.  Chief Complaint  Patient presents with   Follow-up    3 months f/u   Subjective    Nathan Hull is a 62 y.o. male who presents today for a complete physical exam.  He reports consuming a general diet. The patient has a physically strenuous job, but has no regular exercise apart from work.  He generally feels fairly well. He reports sleeping fairly well. He does have additional problems to discuss today.   HPI HPI     Follow-up    Additional comments: 3 months f/u        Comments   Pt stated-lower mid chest pain going the shoulder, especially after eating and when laying down flat, shortness breath-2 months.      Last edited by Shelly Bombard, CMA on 03/12/2023  2:16 PM.     EKG: atrial fibrillation, rate 71, with IVCD and non specific ST elevation. Given previous stenting without consistent CP will refer urgently to cardiology and trial 2 weeks of xarelto to facilitate DCCV following ECHO if warranted at appt.  Past Medical History:  Diagnosis Date   Arthritis    CAD (coronary artery disease)    Carpal tunnel syndrome    Diabetes mellitus without complication (HCC)    Difficult intubation 09/24/2013   reports told difficult after CABG at [facility].  Glidescope used   Dyspnea    walking a mile   Family history of anesthesia complication    mother had N/V after valve replacement   Fusion of spine of cervical region    History of kidney stones    Hyperlipidemia    Hypertension    Myocardial infarct [facility]) 2007   Obesity    Wears contact lenses    Past Surgical History:  Procedure Laterality Date   APPLICATION OF WOUND VAC Right 10/22/2013    Procedure: APPLICATION OF WOUND VAC;  Surgeon: Delight Ovens, MD;  Location: [facility] OR;  Service: Vascular;  Laterality: Right;   C-Spine disc replacement x 2     06/16/2012   CARDIAC CATHETERIZATION     last week @ [facility]   cardiac stents     has been cathed x 4   Had  MI  in 2007   CARPAL TUNNEL RELEASE     on right   COLONOSCOPY WITH PROPOFOL N/A 04/26/2016   Procedure: COLONOSCOPY WITH PROPOFOL;  Surgeon: Midge Minium, MD;  Location: [facility];  Service: Endoscopy;  Laterality: N/A;   CORONARY ARTERY BYPASS GRAFT N/A 09/24/2013   Procedure: CORONARY ARTERY BYPASS GRAFTING (CABG) x three, using LIMA to LAD, and right leg greater saphenous vein harvested endoscopically - SVG to Ramus, SVG to PL;  Surgeon: Kerin Perna, MD;  Location: [facility] OR;  Service: Open Heart Surgery;  Laterality: N/A;   HEMORRHOID SURGERY N/A 05/14/2018   Procedure: HEMORRHOIDECTOMY;  Surgeon: Sung Amabile, DO;  Location: [facility] ORS;  Service: General;  Laterality: N/A;   I & D EXTREMITY Right 10/22/2013   Procedure: IRRIGATION AND DEBRIDEMENT EXTREMITY-RIGHT LEG;  Surgeon: Delight Ovens, MD;  Location: [facility] OR;  Service: Vascular;  Laterality: Right;   INTRAOPERATIVE TRANSESOPHAGEAL ECHOCARDIOGRAM N/A 09/24/2013  Procedure: INTRAOPERATIVE TRANSESOPHAGEAL ECHOCARDIOGRAM;  Surgeon: Kerin Perna, MD;  Location: [facility] OR;  Service: Open Heart Surgery;  Laterality: N/A;   POLYPECTOMY  04/26/2016   Procedure: POLYPECTOMY;  Surgeon: Midge Minium, MD;  Location: [facility];  Service: Endoscopy;;   PTCA at [facility]     04/28/2005   SPINAL FUSION     vertebra, congentital : Dr. Jordan Likes    TONSILLECTOMY     Social History   Socioeconomic History   Marital status: Divorced    Spouse name: Not on file   Number of children: Not on file   Years of education: Not on file   Highest education level: Not on file  Occupational History   Not on file  Tobacco Use   Smoking status: Former    Current packs/day: 0.00     Types: Cigarettes    Quit date: 04/23/2005    Years since quitting: 17.8   Smokeless tobacco: Never  Vaping Use   Vaping status: Never Used  Substance and Sexual Activity   Alcohol use: Yes    Alcohol/week: 1.0 standard drink of alcohol    Types: 1 Cans of beer per week    Comment: half a 6 pack a month   Drug use: No   Sexual activity: Not on file  Other Topics Concern   Not on file  Social History Narrative   Not on file   Social Determinants of Health   Financial Resource Strain: Not on file  Food Insecurity: Not on file  Transportation Needs: Not on file  Physical Activity: Not on file  Stress: Not on file  Social Connections: Not on file  Intimate Partner Violence: Not on file   Family Status  Relation Name Status   Mother  (Not Specified)   Father  (Not Specified)  No partnership data on file   Family History  Problem Relation Age of Onset   Hypertension Mother    Rheumatic fever Mother        S/P MVR   Hypertension Father    Allergies  Allergen Reactions   Metformin And Related Other (See Comments)    Abdominal pain and diarrhea    Patient Care Team: Jacky Kindle, FNP as PCP - General (Family Medicine) Laurier Nancy, MD (Cardiology) Lovett Sox, MD as Consulting Physician (Cardiothoracic Surgery) Myrene Galas, OD as Referring Physician (Optometry)   Medications: Outpatient Medications Prior to Visit  Medication Sig   Blood Glucose Monitoring Suppl (ONE TOUCH ULTRA MINI) w/Device KIT USE AS DIRECTED   chlorpheniramine-HYDROcodone (TUSSIONEX) 10-8 MG/5ML Take 5 mLs by mouth every 12 (twelve) hours as needed for cough.   clopidogrel (PLAVIX) 75 MG tablet Take 1 tablet (75 mg total) by mouth daily.   dapagliflozin propanediol (FARXIGA) 10 MG TABS tablet Take 1 tablet (10 mg total) by mouth daily.   fenofibrate (TRICOR) 145 MG tablet TAKE 1 TABLET BY MOUTH EVERY DAY   lisinopril (ZESTRIL) 40 MG tablet Take 1 tablet (40 mg total) by mouth daily.    metFORMIN (GLUCOPHAGE-XR) 750 MG 24 hr tablet TAKE 1 TABLET BY MOUTH EVERY DAY WITH BREAKFAST   metoprolol succinate (TOPROL-XL) 100 MG 24 hr tablet TAKE 1 TABLET BY MOUTH DAILY. TAKE WITH OR IMMEDIATELY FOLLOWING A MEAL.   ONE TOUCH ULTRA TEST test strip USE AS DIRECTED   ONETOUCH DELICA LANCETS FINE MISC See admin instructions.   rosuvastatin (CRESTOR) 20 MG tablet TAKE 1 TABLET BY MOUTH EVERY DAY   traMADol (ULTRAM) 50  MG tablet Take 1 tablet (50 mg total) by mouth 3 (three) times daily as needed for severe pain (pain score 7-10). TAKE 1 tablet (50 MG total) by mouth 3 (three) times daily as needed for severe pain.  DUE for f/u appointment.   Vitamin D, Ergocalciferol, (DRISDOL) 1.25 MG (50000 UNIT) CAPS capsule TAKE 1 CAPSULE (50,000 UNITS TOTAL) BY MOUTH EVERY 7 (SEVEN) DAYS   No facility-administered medications prior to visit.    Review of Systems    Objective    BP 124/69 (BP Location: Right Arm, Patient Position: Sitting, Cuff Size: Large)   Pulse 74   Ht 6' (1.829 m)   Wt 283 lb (128.4 kg)   SpO2 98%   BMI 38.38 kg/m    Physical Exam Vitals and nursing note reviewed.  Constitutional:      General: He is awake. He is not in acute distress.    Appearance: Normal appearance. He is well-developed and well-groomed. He is obese. He is ill-appearing and diaphoretic. He is not toxic-appearing.  HENT:     Head: Normocephalic and atraumatic.     Jaw: There is normal jaw occlusion. No trismus, tenderness, swelling or pain on movement.     Salivary Glands: Right salivary gland is not diffusely enlarged or tender. Left salivary gland is not diffusely enlarged or tender.     Right Ear: Hearing, tympanic membrane, ear canal and external ear normal. There is no impacted cerumen.     Left Ear: Hearing, tympanic membrane, ear canal and external ear normal. There is no impacted cerumen.     Nose: Nose normal. No congestion or rhinorrhea.     Right Turbinates: Not enlarged, swollen or  pale.     Left Turbinates: Not enlarged, swollen or pale.     Right Sinus: No maxillary sinus tenderness or frontal sinus tenderness.     Left Sinus: No maxillary sinus tenderness or frontal sinus tenderness.     Mouth/Throat:     Lips: Pink.     Mouth: Mucous membranes are moist. No injury, lacerations, oral lesions or angioedema.     Pharynx: Oropharynx is clear. Uvula midline. No pharyngeal swelling, oropharyngeal exudate or posterior oropharyngeal erythema.     Tonsils: No tonsillar exudate or tonsillar abscesses.  Eyes:     General: Lids are normal. Vision grossly intact. Gaze aligned appropriately.        Right eye: No discharge.        Left eye: No discharge.     Extraocular Movements: Extraocular movements intact.     Conjunctiva/sclera: Conjunctivae normal.     Pupils: Pupils are equal, round, and reactive to light.  Neck:     Thyroid: No thyroid mass, thyromegaly or thyroid tenderness.     Vascular: No carotid bruit.     Trachea: Trachea normal. No tracheal tenderness.  Cardiovascular:     Rate and Rhythm: Normal rate. Rhythm irregular.     Pulses: Normal pulses.          Carotid pulses are 2+ on the right side and 2+ on the left side.      Radial pulses are 2+ on the right side and 2+ on the left side.       Femoral pulses are 2+ on the right side and 2+ on the left side.      Popliteal pulses are 2+ on the right side and 2+ on the left side.       Dorsalis pedis pulses are 2+ on  the right side and 2+ on the left side.       Posterior tibial pulses are 2+ on the right side and 2+ on the left side.     Heart sounds: Normal heart sounds, S1 normal and S2 normal. No murmur heard.    No friction rub. No gallop.  Pulmonary:     Effort: Pulmonary effort is normal. No respiratory distress.     Breath sounds: Normal breath sounds and air entry. No stridor. No wheezing, rhonchi or rales.  Chest:     Chest wall: No tenderness.  Abdominal:     General: Abdomen is flat. Bowel  sounds are normal. There is no distension.     Palpations: Abdomen is soft. There is no mass.     Tenderness: There is no abdominal tenderness. There is no guarding or rebound.     Hernia: No hernia is present.  Genitourinary:    Comments: Exam deferred; denies complaints Musculoskeletal:        General: No swelling, tenderness, deformity or signs of injury. Normal range of motion.     Cervical back: Normal range of motion and neck supple. No rigidity or tenderness.     Right lower leg: No edema.     Left lower leg: No edema.  Lymphadenopathy:     Cervical: No cervical adenopathy.     Right cervical: No superficial, deep or posterior cervical adenopathy.    Left cervical: No superficial, deep or posterior cervical adenopathy.  Skin:    General: Skin is warm.     Capillary Refill: Capillary refill takes less than 2 seconds.     Coloration: Skin is not jaundiced or pale.     Findings: No bruising, erythema, lesion or rash.  Neurological:     General: No focal deficit present.     Mental Status: He is alert and oriented to person, place, and time. Mental status is at baseline.     GCS: GCS eye subscore is 4. GCS verbal subscore is 5. GCS motor subscore is 6.     Sensory: Sensation is intact. No sensory deficit.     Motor: Motor function is intact. No weakness.     Coordination: Coordination is intact.     Gait: Gait is intact.  Psychiatric:        Attention and Perception: Attention and perception normal.        Mood and Affect: Mood and affect normal.        Speech: Speech normal.        Behavior: Behavior normal. Behavior is cooperative.        Thought Content: Thought content normal.        Cognition and Memory: Cognition normal.        Judgment: Judgment normal.     Last depression screening scores    09/04/2022    3:27 PM 03/08/2022    2:44 PM 08/14/2021    8:56 AM  PHQ 2/9 Scores  PHQ - 2 Score 0 0 0  PHQ- 9 Score 1 2 2    Last fall risk screening    09/04/2022    3:27  PM  Fall Risk   Falls in the past year? 0  Number falls in past yr: 0  Injury with Fall? 0   Last Audit-C alcohol use screening    09/04/2022    3:26 PM  Alcohol Use Disorder Test (AUDIT)  1. How often do you have a drink containing alcohol? 1  2. How  many drinks containing alcohol do you have on a typical day when you are drinking? 0  3. How often do you have six or more drinks on one occasion? 0  AUDIT-C Score 1   A score of 3 or more in women, and 4 or more in men indicates increased risk for alcohol abuse, EXCEPT if all of the points are from question 1   Results for orders placed or performed in visit on 03/12/23  POCT Glucose (CBG)  Result Value Ref Range   POC Glucose 142 (A) 70 - 99 mg/dl    Assessment & Plan    Routine Health Maintenance and Physical Exam  Exercise Activities and Dietary recommendations  Goals   None     Immunization History  Administered Date(s) Administered   Pneumococcal Polysaccharide-23 09/18/2018   Tdap 02/04/2020    Health Maintenance  Topic Date Due   COVID-19 Vaccine (1) Never done   Zoster Vaccines- Shingrix (1 of 2) Never done   OPHTHALMOLOGY EXAM  01/17/2023   HEMOGLOBIN A1C  03/06/2023   INFLUENZA VACCINE  06/30/2023 (Originally 10/31/2022)   Diabetic kidney evaluation - eGFR measurement  09/04/2023   Diabetic kidney evaluation - Urine ACR  09/04/2023   FOOT EXAM  03/11/2024   Colonoscopy  04/26/2026   DTaP/Tdap/Td (2 - Td or Tdap) 02/03/2030   Hepatitis C Screening  Completed   HIV Screening  Completed   HPV VACCINES  Aged Out   Pneumococcal Vaccine 28-102 Years old  Discontinued    Discussed health benefits of physical activity, and encouraged him to engage in regular exercise appropriate for his age and condition.  Problem List Items Addressed This Visit       Cardiovascular and Mediastinum   Coronary artery disease involving native coronary artery of native heart with angina pectoris with documented spasm (HCC)     Acute on chronic, flared x 2 months Recommend restablish with cardiology      Relevant Medications   diltiazem (CARDIZEM) 30 MG tablet   rivaroxaban (XARELTO) 20 MG TABS tablet   Other Relevant Orders   Ambulatory referral to Cardiology   Hypertension associated with diabetes (HCC)    Chronic, borderline goal remains 119/79 Repeat labs Defer med changes at this time       Relevant Medications   diltiazem (CARDIZEM) 30 MG tablet   rivaroxaban (XARELTO) 20 MG TABS tablet   Other Relevant Orders   CBC with Differential/Platelet   Comprehensive Metabolic Panel (CMET)   TSH     Digestive   Gastroesophageal reflux disease with esophagitis without hemorrhage    Acute, x 2 months with associated CP following meals of any size Reports relief at 0400 daily Recommend H Pylori breast test      Relevant Orders   H. pylori breath test     Endocrine   Hyperlipidemia associated with type 2 diabetes mellitus (HCC)    Chronic, stable Goal remains <55 Repeat LP      Relevant Medications   diltiazem (CARDIZEM) 30 MG tablet   rivaroxaban (XARELTO) 20 MG TABS tablet   Other Relevant Orders   Lipid panel   Type 2 diabetes mellitus with neurological complications (HCC)    Chronic, with ongoing pain following post hepatic neuralgia Repeat A1c and urine micro Continue to recommend balanced, lower carb meals. Smaller meal size, adding snacks. Choosing water as drink of choice and increasing purposeful exercise.       Relevant Orders   Hemoglobin A1c  Other   Annual physical exam - Primary    Things to do to keep yourself healthy  - Exercise at least 30-45 minutes a day, 3-4 days a week.  - Eat a low-fat diet with lots of fruits and vegetables, up to 7-9 servings per day.  - Seatbelts can save your life. Wear them always.  - Smoke detectors on every level of your home, check batteries every year.  - Eye Doctor - have an eye exam every 1-2 years  - Safe sex - if you may be  exposed to STDs, use a condom.  - Alcohol -  If you drink, do it moderately, less than 2 drinks per day.  - Health Care Power of Attorney. Choose someone to speak for you if you are not able.  - Depression is common in our stressful world.If you're feeling down or losing interest in things you normally enjoy, please come in for a visit.  - Violence - If anyone is threatening or hurting you, please call immediately.       Atypical chest pain    Acute on chronic, with associated clamminess POC BG completed- normal reports last meal 3 hrs ago EKG shows RC Afib, with new IVCD      Relevant Orders   EKG 12-Lead   Clamminess    EKG and POC BG completed      Relevant Orders   POCT Glucose (CBG) (Completed)   Screening PSA (prostate specific antigen)    Denies LUTS; recommend PSA in place of DRE. If PSA is elevated for age, we will repeat; if PSA remains elevated pt will be referred to urology for DRE and next steps for best treatment.       Relevant Orders   PSA   Vitamin D deficiency    Chronic, repeat labs      Relevant Orders   Vitamin D (25 hydroxy)   Return if symptoms worsen or fail to improve- call 9-1-1.    Leilani Merl, FNP, have reviewed all documentation for this visit. The documentation on 03/12/23 for the exam, diagnosis, procedures, and orders are all accurate and complete.  Jacky Kindle, FNP  [facility] [phone] (phone) [phone] (fax)  [facility]

## 2023-03-12 NOTE — Assessment & Plan Note (Signed)
Chronic, stable Goal remains <55 Repeat LP

## 2023-03-12 NOTE — Assessment & Plan Note (Signed)
Chronic, borderline goal remains 119/79 Repeat labs Defer med changes at this time

## 2023-03-12 NOTE — Assessment & Plan Note (Signed)
Denies LUTS; recommend PSA in place of DRE. If PSA is elevated for age, we will repeat; if PSA remains elevated pt will be referred to urology for DRE and next steps for best treatment.  

## 2023-03-12 NOTE — Assessment & Plan Note (Signed)
Chronic, with ongoing pain following post hepatic neuralgia Repeat A1c and urine micro Continue to recommend balanced, lower carb meals. Smaller meal size, adding snacks. Choosing water as drink of choice and increasing purposeful exercise.

## 2023-03-12 NOTE — Assessment & Plan Note (Signed)

## 2023-03-13 ENCOUNTER — Other Ambulatory Visit: Payer: Self-pay | Admitting: Family Medicine

## 2023-03-13 DIAGNOSIS — E559 Vitamin D deficiency, unspecified: Secondary | ICD-10-CM

## 2023-03-13 DIAGNOSIS — E1149 Type 2 diabetes mellitus with other diabetic neurological complication: Secondary | ICD-10-CM

## 2023-03-13 LAB — CBC WITH DIFFERENTIAL/PLATELET
Basophils Absolute: 0.1 10*3/uL (ref 0.0–0.2)
Basos: 1 %
EOS (ABSOLUTE): 0.2 10*3/uL (ref 0.0–0.4)
Eos: 2 %
Hematocrit: 44.8 % (ref 37.5–51.0)
Hemoglobin: 14.9 g/dL (ref 13.0–17.7)
Immature Grans (Abs): 0 10*3/uL (ref 0.0–0.1)
Immature Granulocytes: 1 %
Lymphocytes Absolute: 2.7 10*3/uL (ref 0.7–3.1)
Lymphs: 31 %
MCH: 29.4 pg (ref 26.6–33.0)
MCHC: 33.3 g/dL (ref 31.5–35.7)
MCV: 89 fL (ref 79–97)
Monocytes Absolute: 0.6 10*3/uL (ref 0.1–0.9)
Monocytes: 8 %
Neutrophils Absolute: 5 10*3/uL (ref 1.4–7.0)
Neutrophils: 57 %
Platelets: 258 10*3/uL (ref 150–450)
RBC: 5.06 x10E6/uL (ref 4.14–5.80)
RDW: 12.2 % (ref 11.6–15.4)
WBC: 8.6 10*3/uL (ref 3.4–10.8)

## 2023-03-13 LAB — HEMOGLOBIN A1C
Est. average glucose Bld gHb Est-mCnc: 186 mg/dL
Hgb A1c MFr Bld: 8.1 % — ABNORMAL HIGH (ref 4.8–5.6)

## 2023-03-13 LAB — LIPID PANEL
Chol/HDL Ratio: 4.9 {ratio} (ref 0.0–5.0)
Cholesterol, Total: 137 mg/dL (ref 100–199)
HDL: 28 mg/dL — ABNORMAL LOW (ref >39)
LDL Chol Calc (NIH): 43 mg/dL (ref 0–99)
Triglycerides: 457 mg/dL — ABNORMAL HIGH (ref 0–149)
VLDL Cholesterol Cal: 66 mg/dL — ABNORMAL HIGH (ref 5–40)

## 2023-03-13 LAB — COMPREHENSIVE METABOLIC PANEL
ALT: 29 [IU]/L (ref 0–44)
AST: 25 [IU]/L (ref 0–40)
Albumin: 4.3 g/dL (ref 3.8–4.9)
Alkaline Phosphatase: 79 [IU]/L (ref 44–121)
BUN/Creatinine Ratio: 17 (ref 10–24)
BUN: 19 mg/dL (ref 8–27)
Bilirubin Total: 0.3 mg/dL (ref 0.0–1.2)
CO2: 25 mmol/L (ref 20–29)
Calcium: 9.4 mg/dL (ref 8.6–10.2)
Chloride: 101 mmol/L (ref 96–106)
Creatinine, Ser: 1.09 mg/dL (ref 0.76–1.27)
Globulin, Total: 2.7 g/dL (ref 1.5–4.5)
Glucose: 132 mg/dL — ABNORMAL HIGH (ref 70–99)
Potassium: 5.3 mmol/L — ABNORMAL HIGH (ref 3.5–5.2)
Sodium: 138 mmol/L (ref 134–144)
Total Protein: 7 g/dL (ref 6.0–8.5)
eGFR: 78 mL/min/{1.73_m2} (ref >59)

## 2023-03-13 LAB — H. PYLORI BREATH TEST: H pylori Breath Test: NEGATIVE

## 2023-03-13 LAB — PSA: Prostate Specific Ag, Serum: 0.8 ng/mL (ref 0.0–4.0)

## 2023-03-13 LAB — VITAMIN D 25 HYDROXY (VIT D DEFICIENCY, FRACTURES): Vit D, 25-Hydroxy: 29.3 ng/mL — ABNORMAL LOW (ref 30.0–100.0)

## 2023-03-13 LAB — TSH: TSH: 0.793 u[IU]/mL (ref 0.450–4.500)

## 2023-03-13 MED ORDER — FENOFIBRATE 48 MG PO TABS: 192.0000 mg | ORAL_TABLET | Freq: Every day | ORAL | 4 refills | Status: DC

## 2023-03-13 MED ORDER — GLIPIZIDE ER 10 MG PO TB24: 10.0000 mg | ORAL_TABLET | Freq: Every day | ORAL | 3 refills | Status: DC

## 2023-03-13 MED ORDER — METFORMIN HCL ER 750 MG PO TB24: 750.0000 mg | ORAL_TABLET | Freq: Two times a day (BID) | ORAL | 3 refills | Status: DC

## 2023-03-13 MED ORDER — VITAMIN D (ERGOCALCIFEROL) 1.25 MG (50000 UNIT) PO CAPS: 50000.0000 [IU] | ORAL_CAPSULE | ORAL | 0 refills | Status: DC

## 2023-03-13 NOTE — Progress Notes (Signed)
Advised to start fenofibrate to assist. A1c remains uncontrolled; Continue to recommend balanced, lower carb meals. Smaller meal size, adding snacks. Choosing water as drink of choice and increasing purposeful exercise. The 10-year ASCVD risk score (Arnett DK, et al., 2019) is: 18.8% Recommend repeat high dose Vit D to assist.

## 2023-04-03 ENCOUNTER — Other Ambulatory Visit: Payer: Self-pay | Admitting: Family Medicine

## 2023-04-03 DIAGNOSIS — E559 Vitamin D deficiency, unspecified: Secondary | ICD-10-CM

## 2023-04-03 DIAGNOSIS — E1149 Type 2 diabetes mellitus with other diabetic neurological complication: Secondary | ICD-10-CM

## 2023-04-03 DIAGNOSIS — E782 Mixed hyperlipidemia: Secondary | ICD-10-CM

## 2023-04-03 DIAGNOSIS — M542 Cervicalgia: Secondary | ICD-10-CM

## 2023-04-03 DIAGNOSIS — I25810 Atherosclerosis of coronary artery bypass graft(s) without angina pectoris: Secondary | ICD-10-CM

## 2023-04-03 DIAGNOSIS — Z981 Arthrodesis status: Secondary | ICD-10-CM

## 2023-04-03 DIAGNOSIS — E1159 Type 2 diabetes mellitus with other circulatory complications: Secondary | ICD-10-CM

## 2023-04-03 NOTE — Telephone Encounter (Signed)
**Note De-identified  [place] Obfuscation** Please advise 

## 2023-04-04 ENCOUNTER — Other Ambulatory Visit: Payer: Self-pay | Admitting: Family Medicine

## 2023-05-01 ENCOUNTER — Encounter: Payer: Self-pay | Admitting: Internal Medicine

## 2023-05-01 ENCOUNTER — Other Ambulatory Visit: Payer: Self-pay

## 2023-05-01 ENCOUNTER — Encounter
Admission: RE | Disposition: A | Discharge status: Home / Self Care | Payer: Self-pay | Source: Home / Self Care | Attending: Internal Medicine

## 2023-05-01 ENCOUNTER — Ambulatory Visit
Admission: RE | Admit: 2023-05-01 | Discharge: 2023-05-01 | Disposition: A | Discharge status: Home / Self Care | Payer: 59 | Attending: Internal Medicine | Admitting: Internal Medicine

## 2023-05-01 DIAGNOSIS — R079 Chest pain, unspecified: Secondary | ICD-10-CM

## 2023-05-01 DIAGNOSIS — I2582 Chronic total occlusion of coronary artery: Secondary | ICD-10-CM | POA: Insufficient documentation

## 2023-05-01 DIAGNOSIS — T82855A Stenosis of coronary artery stent, initial encounter: Secondary | ICD-10-CM | POA: Diagnosis not present

## 2023-05-01 DIAGNOSIS — I2584 Coronary atherosclerosis due to calcified coronary lesion: Secondary | ICD-10-CM | POA: Insufficient documentation

## 2023-05-01 DIAGNOSIS — Y832 Surgical operation with anastomosis, bypass or graft as the cause of abnormal reaction of the patient, or of later complication, without mention of misadventure at the time of the procedure: Secondary | ICD-10-CM | POA: Diagnosis not present

## 2023-05-01 DIAGNOSIS — I25119 Atherosclerotic heart disease of native coronary artery with unspecified angina pectoris: Secondary | ICD-10-CM | POA: Insufficient documentation

## 2023-05-01 HISTORY — PX: LEFT HEART CATH AND CORONARY ANGIOGRAPHY: CATH118249

## 2023-05-01 LAB — GLUCOSE, CAPILLARY
Glucose-Capillary: 172 mg/dL — ABNORMAL HIGH (ref 70–99)
Glucose-Capillary: 149 mg/dL — ABNORMAL HIGH (ref 70–99)

## 2023-05-01 SURGERY — LEFT HEART CATH AND CORONARY ANGIOGRAPHY
Anesthesia: Moderate Sedation | Laterality: Left

## 2023-05-01 MED ORDER — SODIUM CHLORIDE 0.9% FLUSH: 3.0000 mL | INTRAVENOUS | Status: DC | PRN

## 2023-05-01 MED ORDER — HEPARIN (PORCINE) IN NACL 2000-0.9 UNIT/L-% IV SOLN
INTRAVENOUS | Status: DC | PRN
  Administered 2023-05-01: 1000 mL

## 2023-05-01 MED ORDER — HEPARIN (PORCINE) IN NACL 1000-0.9 UT/500ML-% IV SOLN
INTRAVENOUS | Status: AC
  Filled 2023-05-01: qty 1000

## 2023-05-01 MED ORDER — FENTANYL CITRATE (PF) 100 MCG/2ML IJ SOLN
INTRAMUSCULAR | Status: AC
  Filled 2023-05-01: qty 2

## 2023-05-01 MED ORDER — SODIUM CHLORIDE 0.9 % IV SOLN: 250.0000 mL | INTRAVENOUS | Status: DC | PRN

## 2023-05-01 MED ORDER — MIDAZOLAM HCL 2 MG/2ML IJ SOLN
INTRAMUSCULAR | Status: DC | PRN
  Administered 2023-05-01: 1 mg via INTRAVENOUS

## 2023-05-01 MED ORDER — MIDAZOLAM HCL 2 MG/2ML IJ SOLN
INTRAMUSCULAR | Status: AC
  Filled 2023-05-01: qty 2

## 2023-05-01 MED ORDER — SODIUM CHLORIDE 0.9% FLUSH: 3.0000 mL | Freq: Two times a day (BID) | INTRAVENOUS | Status: DC

## 2023-05-01 MED ORDER — APIXABAN 5 MG PO TABS: 5.0000 mg | ORAL_TABLET | Freq: Two times a day (BID) | ORAL | 0 refills | Status: AC

## 2023-05-01 MED ORDER — SODIUM CHLORIDE 0.9 % WEIGHT BASED INFUSION
1.0000 mL/kg/h | INTRAVENOUS | Status: DC
Start: 2023-05-01 — End: 2023-05-01

## 2023-05-01 MED ORDER — IOHEXOL 300 MG/ML  SOLN
INTRAMUSCULAR | Status: DC | PRN
  Administered 2023-05-01: 195 mL

## 2023-05-01 MED ORDER — SODIUM CHLORIDE 0.9 % WEIGHT BASED INFUSION
3.0000 mL/kg/h | INTRAVENOUS | Status: AC
  Administered 2023-05-01: 3 mL/kg/h via INTRAVENOUS

## 2023-05-01 MED ORDER — ASPIRIN 81 MG PO CHEW
CHEWABLE_TABLET | ORAL | Status: AC
  Filled 2023-05-01: qty 1

## 2023-05-01 MED ORDER — ASPIRIN 81 MG PO CHEW
81.0000 mg | CHEWABLE_TABLET | ORAL | Status: AC
  Administered 2023-05-01: 81 mg via ORAL

## 2023-05-01 MED ORDER — FENTANYL CITRATE (PF) 100 MCG/2ML IJ SOLN
INTRAMUSCULAR | Status: DC | PRN
  Administered 2023-05-01 (×4): 25 ug via INTRAVENOUS

## 2023-05-01 MED ORDER — LIDOCAINE HCL (PF) 1 % IJ SOLN
INTRAMUSCULAR | Status: DC | PRN
  Administered 2023-05-01: 10 mL

## 2023-05-01 MED ORDER — SODIUM CHLORIDE 0.9 % WEIGHT BASED INFUSION: 1.0000 mL/kg/h | INTRAVENOUS | Status: DC

## 2023-05-01 SURGICAL SUPPLY — 14 items
CATH INFINITI 5 FR IM (CATHETERS) IMPLANT
CATH INFINITI 5 FR MPA2 (CATHETERS) IMPLANT
CATH INFINITI 5FR JL5 (CATHETERS) IMPLANT
CATH INFINITI 5FR MULTPACK ANG (CATHETERS) IMPLANT
DEVICE CLOSURE MYNXGRIP 5F (Vascular Products) IMPLANT
NDL PERC 18GX7CM (NEEDLE) IMPLANT
NEEDLE PERC 18GX7CM (NEEDLE) ×1
PACK CARDIAC CATH (CUSTOM PROCEDURE TRAY) ×1 IMPLANT
PROTECTION STATION PRESSURIZED (MISCELLANEOUS) ×1
SET ATX-X65L (MISCELLANEOUS) IMPLANT
SHEATH AVANTI 5FR X 11CM (SHEATH) IMPLANT
STATION PROTECTION PRESSURIZED (MISCELLANEOUS) IMPLANT
WIRE GUIDERIGHT .035X150 (WIRE) IMPLANT
WIRE HITORQ VERSACORE ST 145CM (WIRE) IMPLANT

## 2023-05-01 NOTE — Progress Notes (Signed)
Pt. Given discount card for eliquis. Med list reviewed extensively with pt. With verbalized understanding. Pt. Aware to hold metformin x 48 hrs. And to start Eliquis in AM. Pt. Will follow up with Dr. Melton Alar in 2 weeks.

## 2023-05-01 NOTE — Progress Notes (Signed)
Dr. Juliann Pares in at bedside, speaking with pt. And his family (mother, son) re: cath results. All parties involved verbalized understanding of conversation.

## 2023-05-02 ENCOUNTER — Other Ambulatory Visit: Payer: Self-pay | Admitting: Family Medicine

## 2023-05-02 DIAGNOSIS — Z981 Arthrodesis status: Secondary | ICD-10-CM

## 2023-05-02 DIAGNOSIS — M542 Cervicalgia: Secondary | ICD-10-CM

## 2023-05-02 NOTE — Telephone Encounter (Signed)
[facility] is requesting refill traMADol (ULTRAM) 50 MG tablet  Please advise

## 2023-05-05 ENCOUNTER — Other Ambulatory Visit: Payer: Self-pay | Admitting: Family Medicine

## 2023-05-05 DIAGNOSIS — M542 Cervicalgia: Secondary | ICD-10-CM

## 2023-05-05 DIAGNOSIS — Z981 Arthrodesis status: Secondary | ICD-10-CM

## 2023-05-05 NOTE — Telephone Encounter (Signed)
LOV 03/12/2023 Next OV not scheduled Last refill 04/03/2023, #90, 0 refills  Please review, thanks!

## 2023-05-06 MED ORDER — TRAMADOL HCL 50 MG PO TABS: 50.0000 mg | ORAL_TABLET | Freq: Three times a day (TID) | ORAL | 0 refills | Status: DC | PRN

## 2023-05-08 ENCOUNTER — Encounter: Payer: Self-pay | Admitting: Internal Medicine

## 2023-05-08 LAB — CARDIAC CATHETERIZATION: Cath EF Quantitative: 55 %

## 2023-05-15 ENCOUNTER — Ambulatory Visit: Payer: 59 | Admitting: Anesthesiology

## 2023-05-15 ENCOUNTER — Ambulatory Visit
Admission: RE | Admit: 2023-05-15 | Discharge: 2023-05-15 | Disposition: A | Discharge status: Home / Self Care | Payer: 59 | Attending: Internal Medicine | Admitting: Internal Medicine

## 2023-05-15 ENCOUNTER — Other Ambulatory Visit: Payer: Self-pay

## 2023-05-15 ENCOUNTER — Encounter
Admission: RE | Disposition: A | Discharge status: Home / Self Care | Payer: Self-pay | Source: Home / Self Care | Attending: Internal Medicine

## 2023-05-15 ENCOUNTER — Encounter: Payer: Self-pay | Admitting: Internal Medicine

## 2023-05-15 DIAGNOSIS — I10 Essential (primary) hypertension: Secondary | ICD-10-CM | POA: Insufficient documentation

## 2023-05-15 DIAGNOSIS — I4891 Unspecified atrial fibrillation: Secondary | ICD-10-CM | POA: Diagnosis present

## 2023-05-15 DIAGNOSIS — Z7901 Long term (current) use of anticoagulants: Secondary | ICD-10-CM | POA: Diagnosis not present

## 2023-05-15 DIAGNOSIS — Z7984 Long term (current) use of oral hypoglycemic drugs: Secondary | ICD-10-CM | POA: Diagnosis not present

## 2023-05-15 DIAGNOSIS — I252 Old myocardial infarction: Secondary | ICD-10-CM | POA: Insufficient documentation

## 2023-05-15 DIAGNOSIS — Z87891 Personal history of nicotine dependence: Secondary | ICD-10-CM | POA: Insufficient documentation

## 2023-05-15 DIAGNOSIS — Z951 Presence of aortocoronary bypass graft: Secondary | ICD-10-CM | POA: Insufficient documentation

## 2023-05-15 DIAGNOSIS — E119 Type 2 diabetes mellitus without complications: Secondary | ICD-10-CM | POA: Diagnosis not present

## 2023-05-15 DIAGNOSIS — I2510 Atherosclerotic heart disease of native coronary artery without angina pectoris: Secondary | ICD-10-CM | POA: Insufficient documentation

## 2023-05-15 DIAGNOSIS — E785 Hyperlipidemia, unspecified: Secondary | ICD-10-CM | POA: Diagnosis not present

## 2023-05-15 DIAGNOSIS — I4819 Other persistent atrial fibrillation: Secondary | ICD-10-CM

## 2023-05-15 HISTORY — PX: CARDIOVERSION: SHX1299

## 2023-05-15 LAB — GLUCOSE, CAPILLARY: Glucose-Capillary: 165 mg/dL — ABNORMAL HIGH (ref 70–99)

## 2023-05-15 SURGERY — CARDIOVERSION
Anesthesia: General

## 2023-05-15 MED ORDER — SODIUM CHLORIDE 0.9 % IV SOLN: INTRAVENOUS | Status: DC

## 2023-05-15 MED ORDER — PROPOFOL 10 MG/ML IV BOLUS
INTRAVENOUS | Status: DC | PRN
  Administered 2023-05-15: 10 mg via INTRAVENOUS
  Administered 2023-05-15: 60 mg via INTRAVENOUS

## 2023-05-15 NOTE — Anesthesia Procedure Notes (Signed)
Date/Time: 05/15/2023 11:53 AM  Performed by: Malva Cogan, CRNAPre-anesthesia Checklist: Patient identified, Emergency Drugs available, Suction available, Patient being monitored and Timeout performed Patient Re-evaluated:Patient Re-evaluated prior to induction Oxygen Delivery Method: Nasal cannula Induction Type: IV induction Placement Confirmation: CO2 detector and positive ETCO2

## 2023-05-15 NOTE — Anesthesia Postprocedure Evaluation (Signed)
Anesthesia Post Note  Patient: Nathan Hull  Procedure(s) Performed: CARDIOVERSION  Patient location during evaluation: Endoscopy Anesthesia Type: General Level of consciousness: awake and alert Pain management: pain level controlled Vital Signs Assessment: post-procedure vital signs reviewed and stable Respiratory status: spontaneous breathing, nonlabored ventilation and respiratory function stable Cardiovascular status: blood pressure returned to baseline and stable Postop Assessment: no apparent nausea or vomiting Anesthetic complications: no   No notable events documented.   Last Vitals:  Vitals:   05/15/23 1300 05/15/23 1315  BP: 106/68 116/65  Pulse: 64 64  Resp: 16 10  Temp:    SpO2: 95% 97%    Last Pain:  Vitals:   05/15/23 1245  TempSrc:   PainSc: 0-No pain                 Foye Deer

## 2023-05-15 NOTE — Anesthesia Preprocedure Evaluation (Addendum)
Anesthesia Evaluation  Patient identified by MRN, date of birth, ID band Patient awake    Reviewed: Allergy & Precautions, H&P , NPO status , Patient's Chart, lab work & pertinent test results  History of Anesthesia Complications (+) DIFFICULT AIRWAY and history of anesthetic complications (reports told difficult after CABG at [facility]. Glidescope used)  Airway Mallampati: III  TM Distance: >3 FB Neck ROM: full    Dental  (+) Missing   Pulmonary shortness of breath, former smoker   Pulmonary exam normal        Cardiovascular hypertension, (-) angina + CAD, + Past MI (2007) and + CABG (2015, 3 vessel)   Rhythm:Irregular Rate:Normal - Peripheral Edema    Neuro/Psych negative neurological ROS  negative psych ROS   GI/Hepatic negative GI ROS, Neg liver ROS,,,  Endo/Other  diabetes, Type 2    Renal/GU negative Renal ROS  negative genitourinary   Musculoskeletal   Abdominal  (+) + obese  Peds  Hematology negative hematology ROS (+)   Anesthesia Other Findings Past Medical History: No date: Arthritis No date: CAD (coronary artery disease) No date: Carpal tunnel syndrome No date: Diabetes mellitus without complication (HCC) 09/24/2013: Difficult intubation     Comment:  reports told difficult after CABG at [facility].  Glidescope               used No date: Dyspnea     Comment:  walking a mile No date: Family history of anesthesia complication     Comment:  mother had N/V after valve replacement No date: Fusion of spine of cervical region No date: History of kidney stones No date: Hyperlipidemia No date: Hypertension 2007: Myocardial infarct (HCC) No date: Obesity No date: Wears contact lenses  Past Surgical History: 10/22/2013: APPLICATION OF WOUND VAC; Right     Comment:  Procedure: APPLICATION OF WOUND VAC;  Surgeon: Delight Ovens, MD;  Location: [facility] OR;  Service: Vascular;                 Laterality: Right; No date: C-Spine disc replacement x 2     Comment:  06/16/2012 No date: CARDIAC CATHETERIZATION     Comment:  last week @ [facility] No date: cardiac stents     Comment:  has been cathed x 4   Had  MI  in 2007 No date: CARPAL TUNNEL RELEASE     Comment:  on right 04/26/2016: COLONOSCOPY WITH PROPOFOL; N/A     Comment:  Procedure: COLONOSCOPY WITH PROPOFOL;  Surgeon: Midge Minium, MD;  Location: [facility];  Service:               Endoscopy;  Laterality: N/A; 09/24/2013: CORONARY ARTERY BYPASS GRAFT; N/A     Comment:  Procedure: CORONARY ARTERY BYPASS GRAFTING (CABG) x               three, using LIMA to LAD, and right leg greater saphenous              vein harvested endoscopically - SVG to Ramus, SVG to PL;               Surgeon: Kerin Perna, MD;  Location: [facility] OR;  Service:              Open Heart Surgery;  Laterality: N/A; 05/14/2018: HEMORRHOID SURGERY; N/A  Comment:  Procedure: HEMORRHOIDECTOMY;  Surgeon: Sung Amabile, DO;              Location: [facility] ORS;  Service: General;  Laterality: N/A; 10/22/2013: I & D EXTREMITY; Right     Comment:  Procedure: IRRIGATION AND DEBRIDEMENT EXTREMITY-RIGHT               LEG;  Surgeon: Delight Ovens, MD;  Location[facility] OR;                Service: Vascular;  Laterality: Right; 09/24/2013: INTRAOPERATIVE TRANSESOPHAGEAL ECHOCARDIOGRAM; N/A     Comment:  Procedure: INTRAOPERATIVE TRANSESOPHAGEAL               ECHOCARDIOGRAM;  Surgeon: Kerin Perna, MD;  Location:              [facility] OR;  Service: Open Heart Surgery;  Laterality: N/A; 05/01/2023: LEFT HEART CATH AND CORONARY ANGIOGRAPHY; Left     Comment:  Procedure: LEFT HEART CATH AND CORONARY ANGIOGRAPHY;                Surgeon: Alwyn Pea, MD;  Location: [facility] INVASIVE              CV LAB;  Service: Cardiovascular;  Laterality: Left; 04/26/2016: POLYPECTOMY     Comment:  Procedure: POLYPECTOMY;  Surgeon: Midge Minium, MD;                Location:  [facility] SURGERY [facility];  Service: Endoscopy;; No date: PTCA at [facility]     Comment:  04/28/2005 No date: SPINAL FUSION     Comment:  vertebra, congentital : Dr. Jordan Likes  No date: TONSILLECTOMY     Reproductive/Obstetrics negative OB ROS                             Anesthesia Physical Anesthesia Plan  ASA: 3  Anesthesia Plan: General   Post-op Pain Management:    Induction: Intravenous  PONV Risk Score and Plan: Propofol infusion and TIVA  Airway Management Planned: Natural Airway and Nasal Cannula  Additional Equipment:   Intra-op Plan:   Post-operative Plan:   Informed Consent: I have reviewed the patients History and Physical, chart, labs and discussed the procedure including the risks, benefits and alternatives for the proposed anesthesia with the patient or authorized representative who has indicated his/her understanding and acceptance.     Dental Advisory Given  Plan Discussed with: CRNA and Surgeon  Anesthesia Plan Comments:         Anesthesia Quick Evaluation

## 2023-05-15 NOTE — Transfer of Care (Signed)
Immediate Anesthesia Transfer of Care Note  Patient: Nathan Hull  Procedure(s) Performed: CARDIOVERSION  Patient Location: Nursing Unit  Anesthesia Type:General  Level of Consciousness: awake, alert , and oriented  Airway & Oxygen Therapy: Patient Spontanous Breathing  Post-op Assessment: Report given to RN and Post -op Vital signs reviewed and stable  Post vital signs: Reviewed and stable  Last Vitals:  Vitals Value Taken Time  BP 132/67 05/15/23 1228  Temp    Pulse 67 05/15/23 1230  Resp 17 05/15/23 1230  SpO2 94 % 05/15/23 1230  Vitals shown include unfiled device data.  Last Pain:  Vitals:   05/15/23 1055  TempSrc: Oral  PainSc: 0-No pain         Complications: No notable events documented.

## 2023-05-16 ENCOUNTER — Encounter: Payer: Self-pay | Admitting: Internal Medicine

## 2023-05-30 ENCOUNTER — Telehealth: Payer: Self-pay | Admitting: Family Medicine

## 2023-05-30 DIAGNOSIS — E1149 Type 2 diabetes mellitus with other diabetic neurological complication: Secondary | ICD-10-CM

## 2023-05-30 NOTE — Telephone Encounter (Signed)
 [facility] refill request for the following medications:   metFORMIN (GLUCOPHAGE-XR) 750 MG 24 hr tablet     Please advise.

## 2023-06-02 MED ORDER — METFORMIN HCL ER 750 MG PO TB24: 750.0000 mg | ORAL_TABLET | Freq: Two times a day (BID) | ORAL | 0 refills | Status: DC

## 2023-06-04 ENCOUNTER — Other Ambulatory Visit: Payer: Self-pay | Admitting: Family Medicine

## 2023-06-04 DIAGNOSIS — M542 Cervicalgia: Secondary | ICD-10-CM

## 2023-06-04 DIAGNOSIS — Z981 Arthrodesis status: Secondary | ICD-10-CM

## 2023-06-04 NOTE — Telephone Encounter (Signed)
 Requested medication (s) are due for refill today -yes  Requested medication (s) are on the active medication list -yes  Future visit scheduled -no  Last refill: 05/06/23 #90  Notes to clinic: non delegated Rx  Requested Prescriptions  Pending Prescriptions Disp Refills   traMADol (ULTRAM) 50 MG tablet [Pharmacy Med Name: TRAMADOL HCL 50 MG TABLET] 90 tablet 0    Sig: Take 1 tablet (50 mg total) by mouth 3 (three) times daily as needed for severe pain (pain score 7-10). TAKE 1 tablet (50 MG total) by mouth 3 (three) times daily as needed for severe pain.     Not Delegated - Analgesics:  Opioid Agonists Failed - 06/04/2023  3:34 PM      Failed - This refill cannot be delegated      Failed - Urine Drug Screen completed in last 360 days      Passed - Valid encounter within last 3 months    Recent Outpatient Visits           2 months ago Annual physical exam   [facility] Merita Norton T, FNP   9 months ago Type 2 diabetes mellitus with neurological complications [facility])   [facility] Merita Norton T, FNP   1 year ago Viral URI with cough   [facility] Merita Norton T, FNP   1 year ago Annual physical exam   [facility] Merita Norton T, FNP   1 year ago Type 2 diabetes mellitus with neurological complications [facility])   [facility] Merita Norton T, FNP                 Requested Prescriptions  Pending Prescriptions Disp Refills   traMADol (ULTRAM) 50 MG tablet [Pharmacy Med Name: TRAMADOL HCL 50 MG TABLET] 90 tablet 0    Sig: Take 1 tablet (50 mg total) by mouth 3 (three) times daily as needed for severe pain (pain score 7-10). TAKE 1 tablet (50 MG total) by mouth 3 (three) times daily as needed for severe pain.     Not Delegated - Analgesics:  Opioid Agonists Failed - 06/04/2023  3:34 PM      Failed - This refill cannot be delegated      Failed -  Urine Drug Screen completed in last 360 days      Passed - Valid encounter within last 3 months    Recent Outpatient Visits           2 months ago Annual physical exam   [facility] Merita Norton T, FNP   9 months ago Type 2 diabetes mellitus with neurological complications [facility])   [facility] Merita Norton T, FNP   1 year ago Viral URI with cough    [facility] Jacky Kindle, FNP   1 year ago Annual physical exam   [facility] Merita Norton T, FNP   1 year ago Type 2 diabetes mellitus with neurological complications [facility])   [facility] Jacky Kindle, [place]

## 2023-06-06 ENCOUNTER — Other Ambulatory Visit: Payer: Self-pay | Admitting: Family Medicine

## 2023-06-06 NOTE — Telephone Encounter (Signed)
 Copied from CRM [id number]. Topic: Clinical - Medication Refill >> Jun 06, 2023  8:22 AM Almira Coaster wrote: Most Recent Primary Care Visit:  Provider: Merita Norton T  Department: [id number]  Visit Type: OFFICE VISIT  Date: 03/12/2023  Medication: traMADol (ULTRAM) 50 MG tablet  Has the patient contacted their pharmacy? Yes (Agent: If no, request that the patient contact the pharmacy for the refill. If patient does not wish to contact the pharmacy document the reason why and proceed with request.) (Agent: If yes, when and what did the pharmacy advise?)  Is this the correct pharmacy for this prescription? Yes If no, delete pharmacy and type the correct one.  This is the patient's preferred pharmacy:  [id number] [place], [place] - [address] [place] [address] Phone: [phone] Fax: [phone]   Has the prescription been filled recently? No  Is the patient out of the medication? No  Has the patient been seen for an appointment in the last year OR does the patient have an upcoming appointment? Yes  Can we respond through MyChart? Yes  Agent: Please be advised that Rx refills may take up to 3 business days. We ask that you follow-up with your pharmacy.

## 2023-06-10 ENCOUNTER — Other Ambulatory Visit: Payer: Self-pay

## 2023-06-10 ENCOUNTER — Telehealth: Payer: Self-pay | Admitting: Family Medicine

## 2023-06-10 DIAGNOSIS — E782 Mixed hyperlipidemia: Secondary | ICD-10-CM

## 2023-06-10 MED ORDER — ROSUVASTATIN CALCIUM 20 MG PO TABS: 20.0000 mg | ORAL_TABLET | Freq: Every day | ORAL | 0 refills | Status: DC

## 2023-06-10 NOTE — Telephone Encounter (Signed)
[facility] refill request for the following medications:   rosuvastatin (CRESTOR) 20 MG tablet   Please advise.

## 2023-06-25 ENCOUNTER — Telehealth: Payer: Self-pay | Admitting: Family Medicine

## 2023-06-25 DIAGNOSIS — E1169 Type 2 diabetes mellitus with other specified complication: Secondary | ICD-10-CM

## 2023-06-25 NOTE — Telephone Encounter (Signed)
 [facility] fenofibrate (TRICOR) 48 MG tablet  Please advise

## 2023-06-30 NOTE — Telephone Encounter (Signed)
 CORRECTION on dosage:::[facility] for the following medications:  fenofibrate (TRICOR) 145 MG tablet   Please advise

## 2023-07-06 MED ORDER — FENOFIBRATE 145 MG PO TABS: 145.0000 mg | ORAL_TABLET | Freq: Every day | ORAL | 0 refills | Status: DC

## 2023-07-06 NOTE — Addendum Note (Signed)
 Addended by: Jacquenette Shone on: 07/06/2023 11:34 AM   Modules accepted: Orders

## 2023-07-08 ENCOUNTER — Other Ambulatory Visit: Payer: Self-pay | Admitting: Family Medicine

## 2023-07-08 DIAGNOSIS — M542 Cervicalgia: Secondary | ICD-10-CM

## 2023-07-08 DIAGNOSIS — Z981 Arthrodesis status: Secondary | ICD-10-CM

## 2023-07-10 MED ORDER — TRAMADOL HCL 50 MG PO TABS: 50.0000 mg | ORAL_TABLET | Freq: Three times a day (TID) | ORAL | 2 refills | Status: DC | PRN

## 2023-08-15 NOTE — CV Procedure (Signed)
   Indication symptomatic A. Fibrillation.  Procedure: Using IV Propofol  and IV Lidocaine  (for reducing venous pain) for achieving deep sedation, synchronized direct current cardioversion performed. Patient was delivered with 360 Joules of electricity X 1 with success to NSR. Patient tolerated the procedure well. No immediate complication noted.   Allergies as of 05/15/2023   No Known Allergies      Medication List     ASK your doctor about these medications    apixaban  5 MG Tabs tablet Commonly known as: ELIQUIS  Take 1 tablet (5 mg total) by mouth 2 (two) times daily.   clopidogrel  75 MG tablet Commonly known as: PLAVIX  TAKE 1 TABLET BY MOUTH EVERY DAY   diltiazem  120 MG 24 hr capsule Commonly known as: CARDIZEM  CD Take 120 mg by mouth at bedtime.   Farxiga  10 MG Tabs tablet Generic drug: dapagliflozin  propanediol TAKE 1 TABLET BY MOUTH EVERY DAY   lisinopril  40 MG tablet Commonly known as: ZESTRIL  TAKE 1 TABLET BY MOUTH EVERY DAY   metoprolol  succinate 100 MG 24 hr tablet Commonly known as: TOPROL -XL TAKE 1 TABLET BY MOUTH DAILY. TAKE WITH OR IMMEDIATELY FOLLOWING A MEAL.   multivitamin with minerals tablet Take 1 tablet by mouth daily.   ONE TOUCH ULTRA MINI w/Device Kit USE AS DIRECTED   ONE TOUCH ULTRA TEST test strip Generic drug: glucose blood USE AS DIRECTED   OneTouch Delica Lancets Fine Misc See admin instructions.   Vitamin D  (Ergocalciferol ) 1.25 MG (50000 UNIT) Caps capsule Commonly known as: DRISDOL  TAKE 1 CAPSULE (50,000 UNITS TOTAL) BY MOUTH EVERY 7 (SEVEN) DAYS

## 2023-09-02 ENCOUNTER — Other Ambulatory Visit: Payer: Self-pay | Admitting: Family Medicine

## 2023-09-02 DIAGNOSIS — E559 Vitamin D deficiency, unspecified: Secondary | ICD-10-CM

## 2023-09-02 NOTE — Telephone Encounter (Signed)
 [facility] Vitamin D, Ergocalciferol, (DRISDOL) 1.25 MG (50000 UNIT) CAPS capsule  Please advise

## 2023-09-04 MED ORDER — VITAMIN D (ERGOCALCIFEROL) 1.25 MG (50000 UNIT) PO CAPS
50000.0000 [IU] | ORAL_CAPSULE | ORAL | 0 refills | Status: DC
Start: 2023-09-04 — End: 2023-11-26

## 2023-10-01 ENCOUNTER — Other Ambulatory Visit: Payer: Self-pay | Admitting: Family Medicine

## 2023-10-01 DIAGNOSIS — E1169 Type 2 diabetes mellitus with other specified complication: Secondary | ICD-10-CM

## 2023-10-06 ENCOUNTER — Other Ambulatory Visit: Payer: Self-pay | Admitting: Family Medicine

## 2023-10-06 DIAGNOSIS — M542 Cervicalgia: Secondary | ICD-10-CM

## 2023-10-06 DIAGNOSIS — Z981 Arthrodesis status: Secondary | ICD-10-CM

## 2023-10-22 LAB — HM DIABETES EYE EXAM

## 2023-11-10 ENCOUNTER — Telehealth: Payer: Self-pay

## 2023-11-10 NOTE — Telephone Encounter (Signed)
 [facility] have not yet seen this patient, he was formerly Elise's. Has appointment with you tomorrow   Copied from CRM [id number]. Topic: Clinical - Lab/Test Results >> Nov 10, 2023  9:36 AM Emylou G wrote: Reason for CRM: Patient called.would like fasting labs ordered to be done tomorrow too?  Pls call patient

## 2023-11-11 ENCOUNTER — Ambulatory Visit: Admitting: Family Medicine

## 2023-11-11 ENCOUNTER — Encounter: Payer: Self-pay | Admitting: Family Medicine

## 2023-11-11 VITALS — BP 138/82 | HR 64 | Temp 98.2°F | Ht 72.0 in | Wt 263.7 lb

## 2023-11-11 DIAGNOSIS — I25810 Atherosclerosis of coronary artery bypass graft(s) without angina pectoris: Secondary | ICD-10-CM

## 2023-11-11 DIAGNOSIS — M542 Cervicalgia: Secondary | ICD-10-CM

## 2023-11-11 DIAGNOSIS — E782 Mixed hyperlipidemia: Secondary | ICD-10-CM

## 2023-11-11 DIAGNOSIS — N182 Chronic kidney disease, stage 2 (mild): Secondary | ICD-10-CM

## 2023-11-11 DIAGNOSIS — E1169 Type 2 diabetes mellitus with other specified complication: Secondary | ICD-10-CM | POA: Diagnosis not present

## 2023-11-11 DIAGNOSIS — E1149 Type 2 diabetes mellitus with other diabetic neurological complication: Secondary | ICD-10-CM | POA: Diagnosis not present

## 2023-11-11 DIAGNOSIS — E1159 Type 2 diabetes mellitus with other circulatory complications: Secondary | ICD-10-CM

## 2023-11-11 DIAGNOSIS — Z951 Presence of aortocoronary bypass graft: Secondary | ICD-10-CM | POA: Diagnosis not present

## 2023-11-11 DIAGNOSIS — Z981 Arthrodesis status: Secondary | ICD-10-CM

## 2023-11-11 DIAGNOSIS — I152 Hypertension secondary to endocrine disorders: Secondary | ICD-10-CM

## 2023-11-11 DIAGNOSIS — Z7984 Long term (current) use of oral hypoglycemic drugs: Secondary | ICD-10-CM

## 2023-11-11 DIAGNOSIS — I480 Paroxysmal atrial fibrillation: Secondary | ICD-10-CM

## 2023-11-11 DIAGNOSIS — E559 Vitamin D deficiency, unspecified: Secondary | ICD-10-CM

## 2023-11-11 DIAGNOSIS — D367 Benign neoplasm of other specified sites: Secondary | ICD-10-CM

## 2023-11-11 MED ORDER — FENOFIBRATE 145 MG PO TABS: 145.0000 mg | ORAL_TABLET | Freq: Every day | ORAL | 0 refills | Status: DC

## 2023-11-11 MED ORDER — TRAMADOL HCL 50 MG PO TABS: 50.0000 mg | ORAL_TABLET | Freq: Three times a day (TID) | ORAL | 3 refills | Status: DC | PRN

## 2023-11-11 MED ORDER — ROSUVASTATIN CALCIUM 20 MG PO TABS: 20.0000 mg | ORAL_TABLET | Freq: Every day | ORAL | 1 refills | Status: DC

## 2023-11-11 NOTE — Progress Notes (Signed)
 Established patient visit   Patient: Nathan Hull   DOB: 1960/09/17   63 y.o. Male  MRN: [id number] Visit Date: 11/11/2023  Today's healthcare provider: LAURAINE LOISE BUOY, DO   Chief Complaint  Patient presents with   Medical Management of Chronic Issues    Patient presents for follow up of chronic conditions, patient reports he has had some new dx over the past few months. Reports he has Afib, had ablations. Patient would like to do fasting lab work today if able to Reports he has not taken BP meds today   Cyst    Located on neck, states he has had it removed many times and it reappears. States gen surgery downstairs removed it many years ago- wants to know if it can be done again.    Subjective    HPI Nathan Hull is a 63 year old male with atrial fibrillation who presents for follow-up and medication management.  He has been experiencing atrial fibrillation since approximately December 2024, initially identified by previous provider at this clinic after presenting with symptoms of difficulty sleeping while lying down.  He has a history of coronary artery disease, having undergone a coronary artery bypass graft (CABG) over ten years ago following a heart attack. Initially, he was treated with stents before undergoing open-heart surgery. He is currently taking rosuvastatin  for cholesterol management, which was switched from atorvastatin  at an unspecified time, and fenofibrate , which is due for a refill. He experiences occasional discomfort after eating large meals, especially if he exercises shortly after. No recent significant chest pain, shortness of breath, or chronic coughing.  He has type 2 diabetes and is taking metformin  once daily, although it was originally prescribed twice daily. He has been actively managing his diabetes by going to the gym and avoiding sweets for the past two months. He does not check his blood sugars at home.  He has a recurrent cyst on the right  side of his neck, which has been removed multiple times in the past. It is currently not painful but has become bothersome again.  He has a history of shingles, having experienced it at least once beside his right eye, and also had Bell's palsy concurrently. He declined the shingles vaccine during this visit.  He takes tramadol  for pain management, primarily for neck, shoulder blade, and central back pain, at a frequency of two to three times daily. He has been engaging in more stretching and strengthening exercises at the gym to improve his stamina.  He quit smoking in 2007 following a heart attack and has not smoked since.       Medications: Outpatient Medications Prior to Visit  Medication Sig   amiodarone  (PACERONE ) 200 MG tablet Take 200 mg by mouth 2 (two) times daily.   apixaban  (ELIQUIS ) 5 MG TABS tablet Take 1 tablet (5 mg total) by mouth 2 (two) times daily.   Blood Glucose Monitoring Suppl (ONE TOUCH ULTRA MINI) w/Device KIT USE AS DIRECTED   clopidogrel  (PLAVIX ) 75 MG tablet TAKE 1 TABLET BY MOUTH EVERY DAY   diltiazem  (CARDIZEM  CD) 120 MG 24 hr capsule Take 120 mg by mouth at bedtime.   FARXIGA  10 MG TABS tablet TAKE 1 TABLET BY MOUTH EVERY DAY   lisinopril  (ZESTRIL ) 40 MG tablet TAKE 1 TABLET BY MOUTH EVERY DAY   metFORMIN  (GLUCOPHAGE -XR) 750 MG 24 hr tablet Take 1 tablet (750 mg total) by mouth 2 (two) times daily before lunch and supper.  Multiple Vitamins-Minerals (MULTIVITAMIN WITH MINERALS) tablet Take 1 tablet by mouth daily.   ONE TOUCH ULTRA TEST test strip USE AS DIRECTED   ONETOUCH DELICA LANCETS FINE MISC See admin instructions.   Vitamin D , Ergocalciferol , (DRISDOL ) 1.25 MG (50000 UNIT) CAPS capsule Take 1 capsule (50,000 Units total) by mouth every 7 (seven) days.   [DISCONTINUED] fenofibrate  (TRICOR ) 145 MG tablet TAKE 1 TABLET (145 MG TOTAL) BY MOUTH DAILY. NEED APPOINTMENT   [DISCONTINUED] rosuvastatin  (CRESTOR ) 20 MG tablet Take 1 tablet (20 mg total) by  mouth daily.   [DISCONTINUED] traMADol  (ULTRAM ) 50 MG tablet Take 1 tablet (50 mg total) by mouth 3 (three) times daily as needed. NEED APPOINTMENT for future refills   [DISCONTINUED] metoprolol  succinate (TOPROL -XL) 100 MG 24 hr tablet TAKE 1 TABLET BY MOUTH DAILY. TAKE WITH OR IMMEDIATELY FOLLOWING A MEAL.   No facility-administered medications prior to visit.    Review of Systems  Respiratory: Negative.  Negative for cough, shortness of breath and wheezing.   Cardiovascular:  Negative for chest pain, palpitations and leg swelling.  Neurological:  Negative for dizziness, weakness, light-headedness and headaches.        Objective    BP 138/82 (BP Location: Left Arm, Patient Position: Sitting, Cuff Size: Normal)   Pulse 64   Temp 98.2 F (36.8 C) (Oral)   Ht 6' (1.829 m)   Wt 263 lb 11.2 oz (119.6 kg)   SpO2 98%   BMI 35.76 kg/m     Physical Exam Vitals reviewed.  Constitutional:      General: He is not in acute distress.    Appearance: Normal appearance. He is not diaphoretic.  HENT:     Head: Normocephalic and atraumatic.  Eyes:     General: No scleral icterus.    Conjunctiva/sclera: Conjunctivae normal.  Cardiovascular:     Rate and Rhythm: Normal rate and regular rhythm.     Pulses: Normal pulses.     Heart sounds: Normal heart sounds. No murmur heard. Pulmonary:     Effort: Pulmonary effort is normal. No respiratory distress.     Breath sounds: Normal breath sounds. No wheezing or rhonchi.  Musculoskeletal:     Cervical back: Neck supple.     Right lower leg: No edema.     Left lower leg: No edema.  Lymphadenopathy:     Cervical: No cervical adenopathy.  Skin:    General: Skin is warm and dry.     Findings: Lesion (cyst to left posterior neck (approx. 3 cm x 6 cm)) present. No rash.  Neurological:     Mental Status: He is alert and oriented to person, place, and time. Mental status is at baseline.  Psychiatric:        Mood and Affect: Mood normal.         Behavior: Behavior normal.      No results found for any visits on 11/11/23.  Assessment & Plan    Type 2 diabetes mellitus with neurological complications (HCC) -     Microalbumin / creatinine urine ratio -     Hemoglobin A1c  Coronary artery disease involving coronary bypass graft of native heart without angina pectoris -     Comprehensive metabolic panel with GFR -     Lipid panel  Hyperlipidemia associated with type 2 diabetes mellitus (HCC) -     Fenofibrate ; Take 1 tablet (145 mg total) by mouth daily.  Dispense: 90 tablet; Refill: 0  Hypertension associated with diabetes (HCC)  S/P  CABG x 3  Paroxysmal atrial fibrillation (HCC)  Neck pain -     traMADol  HCl; Take 1 tablet (50 mg total) by mouth 3 (three) times daily as needed.  Dispense: 90 tablet; Refill: 3  S/P cervical spinal fusion -     traMADol  HCl; Take 1 tablet (50 mg total) by mouth 3 (three) times daily as needed.  Dispense: 90 tablet; Refill: 3  Dermoid cyst of neck -     Ambulatory referral to General Surgery  Vitamin D  deficiency -     VITAMIN D  25 Hydroxy (Vit-D Deficiency, Fractures)  Mixed hyperlipidemia -     Rosuvastatin  Calcium ; Take 1 tablet (20 mg total) by mouth daily.  Dispense: 90 tablet; Refill: 1  Chronic kidney disease, stage 2, mildly decreased GFR      Type 2 diabetes mellitus Type 2 diabetes managed with metformin  XR 750 mg (only taking it once daily) and Farxiga  10 mg daily. Awaiting A1c results. - Check A1c for diabetes control. - Continue metformin  and Farxiga .  Paroxysmal atrial fibrillation Paroxysmal atrial fibrillation on diltiazem  and amiodarone , managed by cardiology. - Continue diltiazem  24-hour capsule 120 mg daily. - Continue amiodarone  200 mg twice daily. - Continue apixaban  5 mg twice daily. - Continue management with cardiology.  Defer to specialist management  Coronary artery disease, status post CABG and stents Coronary artery disease with previous CABG  and stents. Occasional discomfort post meals or exercise. Regular cardiology follow-up. - Order blood work for cholesterol levels. - Continue rosuvastatin  20 mg and send refill.   - Follows with cardiology; defer to specialist management  Hyperlipidemia Hyperlipidemia managed with rosuvastatin . Awaiting lipid level assessment. - Continue rosuvastatin  and send refill. - Order blood work for lipid levels.  Hypertension Chronic, stable.  Mildly elevated today but improved upon recheck.  Will not adjust medications as patient did not take his medication today, due to concern for interference with blood work.  Advised him he should always take his medication prior to coming in for his appointments, even if fasting. - Continue lisinopril  40 mg daily - Continue diltiazem  24-hour capsule 120 mg daily and amiodarone  200 mg twice daily, which are for his atrial fibrillation but also impact his blood pressure. - Follows with cardiology; defer to specialist management.  Chronic neck and upper back pain Chronic pain managed with tramadol  and exercises. - Continue tramadol  as needed. - Encourage stretching and strengthening exercises.  Dermoid cyst, left neck Recurrent cyst on left side of posterior neck, desires removal. - Refer for surgical removal of cyst.  Chronic kidney disease stage 2 Chronic, stable.  Continue to optimize risk factors.  General Health Maintenance Declined flu, COVID, pneumonia, and shingles vaccines despite history and risk. - Document vaccine declinations.     Return in about 4 months (around 03/12/2024) for CPE, Chronic f/u.      I discussed the assessment and treatment plan with the patient  The patient was provided an opportunity to ask questions and all were answered. The patient agreed with the plan and demonstrated an understanding of the instructions.   The patient was advised to call back or seek an in-person evaluation if the symptoms worsen or if the  condition fails to improve as anticipated.    LAURAINE LOISE BUOY, DO  [facility] [phone] (phone) [phone] (fax)  [facility]

## 2023-11-11 NOTE — Patient Instructions (Signed)
 Contact clinic to ask about your home sleep study, since it was ordered a month ago.

## 2023-11-12 LAB — LIPID PANEL
Chol/HDL Ratio: 4.2 ratio (ref 0.0–5.0)
Cholesterol, Total: 148 mg/dL (ref 100–199)
HDL: 35 mg/dL — ABNORMAL LOW (ref >39)
LDL Chol Calc (NIH): 86 mg/dL (ref 0–99)
Triglycerides: 154 mg/dL — ABNORMAL HIGH (ref 0–149)
VLDL Cholesterol Cal: 27 mg/dL (ref 5–40)

## 2023-11-12 LAB — COMPREHENSIVE METABOLIC PANEL WITH GFR
ALT: 20 IU/L (ref 0–44)
AST: 22 IU/L (ref 0–40)
Albumin: 4.6 g/dL (ref 3.9–4.9)
Alkaline Phosphatase: 57 IU/L (ref 44–121)
BUN/Creatinine Ratio: 15 (ref 10–24)
BUN: 20 mg/dL (ref 8–27)
Bilirubin Total: 0.3 mg/dL (ref 0.0–1.2)
CO2: 21 mmol/L (ref 20–29)
Calcium: 10 mg/dL (ref 8.6–10.2)
Chloride: 101 mmol/L (ref 96–106)
Creatinine, Ser: 1.33 mg/dL — ABNORMAL HIGH (ref 0.76–1.27)
Globulin, Total: 2.6 g/dL (ref 1.5–4.5)
Glucose: 112 mg/dL — ABNORMAL HIGH (ref 70–99)
Potassium: 5.4 mmol/L — ABNORMAL HIGH (ref 3.5–5.2)
Sodium: 138 mmol/L (ref 134–144)
Total Protein: 7.2 g/dL (ref 6.0–8.5)
eGFR: 61 mL/min/1.73 (ref >59)

## 2023-11-12 LAB — MICROALBUMIN / CREATININE URINE RATIO
Creatinine, Urine: 91.2 mg/dL
Microalb/Creat Ratio: 11 mg/g{creat} (ref 0–29)
Microalbumin, Urine: 10.2 ug/mL

## 2023-11-12 LAB — HEMOGLOBIN A1C
Est. average glucose Bld gHb Est-mCnc: 166 mg/dL
Hgb A1c MFr Bld: 7.4 % — ABNORMAL HIGH (ref 4.8–5.6)

## 2023-11-12 LAB — VITAMIN D 25 HYDROXY (VIT D DEFICIENCY, FRACTURES): Vit D, 25-Hydroxy: 52.2 ng/mL (ref 30.0–100.0)

## 2023-11-14 ENCOUNTER — Ambulatory Visit: Attending: Internal Medicine

## 2023-11-14 DIAGNOSIS — G4733 Obstructive sleep apnea (adult) (pediatric): Secondary | ICD-10-CM | POA: Insufficient documentation

## 2023-11-23 ENCOUNTER — Other Ambulatory Visit: Payer: Self-pay | Admitting: Family Medicine

## 2023-11-23 DIAGNOSIS — E559 Vitamin D deficiency, unspecified: Secondary | ICD-10-CM

## 2023-11-24 ENCOUNTER — Ambulatory Visit: Payer: Self-pay | Admitting: Family Medicine

## 2023-11-24 ENCOUNTER — Encounter: Payer: Self-pay | Admitting: Surgery

## 2023-11-24 ENCOUNTER — Other Ambulatory Visit: Payer: Self-pay | Admitting: Family Medicine

## 2023-11-24 ENCOUNTER — Telehealth: Payer: Self-pay

## 2023-11-24 ENCOUNTER — Ambulatory Visit (INDEPENDENT_AMBULATORY_CARE_PROVIDER_SITE_OTHER): Payer: Self-pay | Admitting: Surgery

## 2023-11-24 VITALS — BP 126/70 | HR 67 | Temp 98.3°F | Ht 72.0 in | Wt 262.6 lb

## 2023-11-24 DIAGNOSIS — E875 Hyperkalemia: Secondary | ICD-10-CM

## 2023-11-24 DIAGNOSIS — L720 Epidermal cyst: Secondary | ICD-10-CM

## 2023-11-24 MED ORDER — LOKELMA 10 G PO PACK: 10.0000 g | PACK | Freq: Two times a day (BID) | ORAL | 0 refills | Status: DC

## 2023-11-24 NOTE — Progress Notes (Unsigned)
 11/24/2023  Reason for Visit:  Posterior neck cyst  Requesting Provider:  Lauraine Buoy, DO  History of Present Illness: Nathan Hull is a 63 y.o. male presenting for evaluation of posterior neck cyst.  The patient reports that this has been present for many years.  In the past he has had this area either lanced or excised twice before.  He reports that once it was done in our office several years ago and also in [place] but he cannot remember which place was not first.  He is also not sure if the cyst was fully excised or only lanced.  Nonetheless, he reports that after the last procedure, he has continued to increase in size and now has become more bothersome.  Denies any specific range of motion limitations due to the cyst but his main complaint is the discomfort and bothersome nature of the cyst.  Has not had any drainage from this area recently and has not noticed any erythema or induration either.  Of note, the patient has significant cardiac history with a history of MI status post stents and CABG in the past, currently on Plavix , atrial fibrillation and is currently on Eliquis , hyperlipidemia, hypertension, diabetes.  Past Medical History: Past Medical History:  Diagnosis Date   Arthritis    CAD (coronary artery disease)    Carpal tunnel syndrome    Diabetes mellitus without complication (HCC)    Difficult intubation 09/24/2013   reports told difficult after CABG at [facility].  Glidescope used   Dyspnea    walking a mile   Family history of anesthesia complication    mother had N/V after valve replacement   Fusion of spine of cervical region    History of kidney stones    Hyperlipidemia    Hypertension    Myocardial infarct [facility]) 2007   Obesity    Wears contact lenses      Past Surgical History: Past Surgical History:  Procedure Laterality Date   APPLICATION OF WOUND VAC Right 10/22/2013   Procedure: APPLICATION OF WOUND VAC;  Surgeon: Dallas KATHEE Jude, MD;  Location: [facility]  OR;  Service: Vascular;  Laterality: Right;   C-Spine disc replacement x 2     06/16/2012   CARDIAC CATHETERIZATION     last week @ [facility]   cardiac stents     has been cathed x 4   Had  MI  in 2007   CARDIOVERSION N/A 05/15/2023   Procedure: CARDIOVERSION;  Surgeon: Dewane Shiner, DO;  Location: [facility] ORS;  Service: Cardiovascular;  Laterality: N/A;   CARPAL TUNNEL RELEASE     on right   COLONOSCOPY WITH PROPOFOL  N/A 04/26/2016   Procedure: COLONOSCOPY WITH PROPOFOL ;  Surgeon: Rogelia Copping, MD;  Location: [facility]GERY [facility];  Service: Endoscopy;  Laterality: N/A;   CORONARY ARTERY BYPASS GRAFT N/A 09/24/2013   Procedure: CORONARY ARTERY BYPASS GRAFTING (CABG) x three, using LIMA to LAD, and right leg greater saphenous vein harvested endoscopically - SVG to Ramus, SVG to PL;  Surgeon: Maude Fleeta Ochoa, MD;  Location: [facility] OR;  Service: Open Heart Surgery;  Laterality: N/A;   HEMORRHOID SURGERY N/A 05/14/2018   Procedure: HEMORRHOIDECTOMY;  Surgeon: Tye Millet, DO;  Location: [facility] ORS;  Service: General;  Laterality: N/A;   I & D EXTREMITY Right 10/22/2013   Procedure: IRRIGATION AND DEBRIDEMENT EXTREMITY-RIGHT LEG;  Surgeon: Dallas KATHEE Jude, MD;  Location: [facility] OR;  Service: Vascular;  Laterality: Right;   INTRAOPERATIVE TRANSESOPHAGEAL ECHOCARDIOGRAM N/A 09/24/2013   Procedure:  INTRAOPERATIVE TRANSESOPHAGEAL ECHOCARDIOGRAM;  Surgeon: Maude Fleeta Ochoa, MD;  Location: [facility] OR;  Service: Open Heart Surgery;  Laterality: N/A;   LEFT HEART CATH AND CORONARY ANGIOGRAPHY Left 05/01/2023   Procedure: LEFT HEART CATH AND CORONARY ANGIOGRAPHY;  Surgeon: Florencio Cara BIRCH, MD;  Location: [facility] INVASIVE CV LAB;  Service: Cardiovascular;  Laterality: Left;   POLYPECTOMY  04/26/2016   Procedure: POLYPECTOMY;  Surgeon: Rogelia Copping, MD;  Location: [facility];  Service: Endoscopy;;   PTCA at [facility]     04/28/2005   SPINAL FUSION     vertebra, [facility]gentital : Dr. Louis    TONSILLECTOMY      Home  Medications: Prior to Admission medications   Medication Sig Start Date End Date Taking? Authorizing Provider  amiodarone  (PACERONE ) 200 MG tablet Take 200 mg by mouth 2 (two) times daily. 09/26/23 09/25/24 Yes [provider]  apixaban  (ELIQUIS ) 5 MG TABS tablet Take 1 tablet (5 mg total) by mouth 2 (two) times daily. 05/01/23 11/24/23 Yes Hudson, Caralyn, PA-C  Blood Glucose Monitoring Suppl (ONE TOUCH ULTRA MINI) w/Device KIT USE AS DIRECTED 09/16/16  Yes Chauvin, Lamar, PA  clopidogrel  (PLAVIX ) 75 MG tablet TAKE 1 TABLET BY MOUTH EVERY DAY 04/03/23  Yes Emilio Marseille T, FNP  diltiazem  (CARDIZEM  CD) 120 MG 24 hr capsule Take 120 mg by mouth at bedtime.   Yes [provider]  FARXIGA  10 MG TABS tablet TAKE 1 TABLET BY MOUTH EVERY DAY 04/03/23  Yes Emilio Marseille T, FNP  fenofibrate  (TRICOR ) 145 MG tablet Take 1 tablet (145 mg total) by mouth daily. 11/11/23  Yes Pardue, Lauraine SAILOR, DO  lisinopril  (ZESTRIL ) 40 MG tablet TAKE 1 TABLET BY MOUTH EVERY DAY 04/03/23  Yes Emilio Marseille DASEN, FNP  metFORMIN  (GLUCOPHAGE -XR) 750 MG 24 hr tablet Take 1 tablet (750 mg total) by mouth 2 (two) times daily before lunch and supper. Patient taking differently: Take 750 mg by mouth 2 (two) times daily before lunch and supper. Taking 1500 mg daily 06/02/23  Yes Pardue, Sarah N, DO  Multiple Vitamins-Minerals (MULTIVITAMIN WITH MINERALS) tablet Take 1 tablet by mouth daily.   Yes [provider]  ONE TOUCH ULTRA TEST test strip USE AS DIRECTED 10/28/16  Yes Chauvin, Duvid, PA  [facility] DELICA LANCETS FINE MISC See admin instructions. 08/02/16  Yes [provider]  rosuvastatin  (CRESTOR ) 20 MG tablet Take 1 tablet (20 mg total) by mouth daily. 11/11/23  Yes Pardue, Lauraine SAILOR, DO  sodium zirconium cyclosilicate  (LOKELMA ) 10 g PACK packet Take 10 g by mouth 2 (two) times daily for 2 days. 11/24/23 11/26/23 Yes Pardue, Lauraine SAILOR, DO  traMADol  (ULTRAM ) 50 MG tablet Take 1 tablet (50 mg total) by mouth 3 (three) times  daily as needed. 11/11/23  Yes Pardue, Lauraine SAILOR, DO  Vitamin D , Ergocalciferol , (DRISDOL ) 1.25 MG (50000 UNIT) CAPS capsule Take 1 capsule (50,000 Units total) by mouth every 7 (seven) days. 09/04/23  Yes Pardue, Lauraine SAILOR, DO    Allergies: No Known Allergies  Social History:  reports that he quit smoking about 18 years ago. His smoking use included cigarettes. He has never used smokeless tobacco. He reports that he does not currently use alcohol  after a past usage of about 1.0 standard drink of alcohol  per week. He reports that he does not use drugs.   Family History: Family History  Problem Relation Age of Onset   Hypertension Mother    Rheumatic fever Mother        S/P  MVR   Hypertension Father     Review of Systems: Review of Systems  Constitutional:  Negative for chills and fever.  Respiratory:  Negative for shortness of breath.   Cardiovascular:  Negative for chest pain.  Gastrointestinal:  Negative for nausea and vomiting.  Skin:        Posterior left neck cyst    Physical Exam BP 126/70   Pulse 67   Temp 98.3 F (36.8 C) (Oral)   Ht 6' (1.829 m)   Wt 262 lb 9.6 oz (119.1 kg)   SpO2 95%   BMI 35.61 kg/m  CONSTITUTIONAL: No acute distress HEENT:  Normocephalic, atraumatic, extraocular motion intact. RESPIRATORY:  Lungs are clear, and breath sounds are equal bilaterally. Normal respiratory effort without pathologic use of accessory muscles. CARDIOVASCULAR: Heart is regular without murmurs, gallops, or rubs. MUSCULOSKELETAL:  Normal muscle strength and tone in all four extremities.  No peripheral edema or cyanosis. SKIN: The patient has a 7 cm cyst in the posterior left neck.  It is soft, somewhat mobile, non-tender, without evidence of active infection.  There is about 1.5 cm scar from what seems prior I&D, but no larger scar from excision. NEUROLOGIC:  Motor and sensation is grossly normal.  Cranial nerves are grossly intact. PSYCH:  Alert and oriented to person, place  and time. Affect is normal.  Laboratory Analysis: No results found for this or any previous visit (from the past 24 hours).  Imaging: No results found.  Assessment and Plan: This is a 63 y.o. male with a posterior left neck cyst.  - Discussed with patient that the mass on his left neck is most likely a sebaceous cyst or epidermal inclusion cyst.  The incision scar that is present overlying the cyst is relatively small that I do not think the cyst was ever fully excised unless initially it was that small to begin with.  However, whether it is truly a recurrent or new cyst, it is causing symptoms due to the size.  Discussed with him that also given the size and his medical history, it would be more appropriate to excise this in the operating room under anesthesia.  He is in agreement. - He is scheduled to have cataract surgery next week and as such, ideally we should wait longer before doing his cyst excision.  Will aim for surgery on 01/01/2024.  Will also send for cardiology clearance.  Ideally it would be helpful to stop both Plavix  and Eliquis  but will defer to cardiology for final determination.  Discussed with patient the surgery at length including the planned incision, risks of bleeding, infection, injury to surrounding structures, that this would be an outpatient procedure, activity restrictions, pain control, and he is willing to proceed. - All of his questions have been answered.  I spent 30 minutes dedicated to the care of this patient on the date of this encounter to include pre-visit review of records, face-to-face time with the patient discussing diagnosis and management, and any post-visit coordination of care.   Aloysius Sheree Plant, MD [facility]

## 2023-11-24 NOTE — Telephone Encounter (Signed)
 Faxed cardiac clearance to Dr. Juliann Pares at [phone].

## 2023-11-24 NOTE — Patient Instructions (Signed)
 Removing a Pocket of Fluid in the Skin (Epidermoid Cyst Removal): What to Expect Epidermoid cyst removal is a procedure to remove a pocket of fluid that forms under the skin. This pocket of fluid is called an epidermoid cyst. It's filled with thick, oily substance that your skin glands make. They're usually harmless. If the cyst gets big, painful, or inflamed, your health care provider may suggest removing it. Tell a health care provider about: Any allergies you have. All medicines you take. These include vitamins, herbs, eye drops, and creams. Any problems you or family members have had with anesthesia. Any bleeding problems you have. Any surgeries you've had. Any medical conditions you have. Whether you're pregnant or may be pregnant. What are the risks? Your provider will talk with you about risks. These may include: The cyst coming back. Bleeding. Infection. Scarring. What happens before the procedure? Ask about changing or stopping: Any medicines you take. Any vitamins, herbs, or supplements you take. Do not take aspirin or ibuprofen unless you're told to. If you were given antibiotics, take them as told. Do not stop taking them even if you start to feel better. Shower on the morning of your procedure. Your provider may ask you to wash with a soap that kills germs. What happens during the procedure?  You may be given: Anesthesia. This keeps you from feeling pain. It will numb certain areas of your body. The cyst area will be cleaned. A small cut will be made over the cyst. The cyst will be separated from the surrounding tissues. If possible, the cyst will be taken out as a whole. If the cyst bursts, it will be taken out in pieces. Any bleeding will be controlled. The cut will be closed with stitches, if needed. Antibiotic ointment and a bandage may be put on the area. These steps may vary. Ask what you can expect. What happens after the procedure? If you were given  antibiotic medicine or ointment, use them for the time you were told. Do not stop using them sooner even if you start to feel better. You will need to reapply the enzyme ointment to the wound. Your provider will show you how to reapply the ointment and the dressing. This information is not intended to replace advice given to you by your health care provider. Make sure you discuss any questions you have with your health care provider. Document Revised: 11/01/2022 Document Reviewed: 11/01/2022 Elsevier Patient Education  2024 [employer].

## 2023-11-24 NOTE — Telephone Encounter (Signed)
 Requested medications are due for refill today.  yes  Requested medications are on the active medications list.  yes  Last refill. 09/04/2023 #13 0 rf  Future visit scheduled.   no  Notes to clinic.  Provider to review at the dosage.    Requested Prescriptions  Pending Prescriptions Disp Refills   Vitamin D , Ergocalciferol , (DRISDOL ) 1.25 MG (50000 UNIT) CAPS capsule [Pharmacy Med Name: VITAMIN D2 1.25MG (50,000 UNIT)] 12 capsule 1    Sig: Take 1 capsule (50,000 Units total) by mouth every 7 (seven) days.     Endocrinology:  Vitamins - Vitamin D  Supplementation 2 Failed - 11/24/2023  5:14 PM      Failed - Manual Review: Route requests for 50,000 IU strength to the provider      Passed - Ca in normal range and within 360 days    Calcium   Date Value Ref Range Status  11/11/2023 10.0 8.6 - 10.2 mg/dL Final   Calcium , Ion  Date Value Ref Range Status  09/25/2013 1.12 1.12 - 1.23 mmol/L Final         Passed - Vitamin D  in normal range and within 360 days    Vit D, 25-Hydroxy  Date Value Ref Range Status  11/11/2023 52.2 30.0 - 100.0 ng/mL Final    Comment:    Vitamin D  deficiency has been defined by the Institute of Medicine and an Endocrine Society practice guideline as a level of serum 25-OH vitamin D  less than 20 ng/mL (1,2). The Endocrine Society went on to further define vitamin D  insufficiency as a level between 21 and 29 ng/mL (2). 1. IOM (Institute of Medicine). 2010. Dietary reference    intakes for calcium  and D. [place]: The    [employer]. 2. Holick MF, Binkley [place], Bischoff-Ferrari HA, et al.    Evaluation, treatment, and prevention of vitamin D     deficiency: an Endocrine Society clinical practice    guideline. JCEM. 2011 Jul; 96(7):1911-30.          Passed - Valid encounter within last 12 months    Recent Outpatient Visits           1 week ago Type 2 diabetes mellitus with neurological complications [facility])   [facility] [place], Lauraine SAILOR, [place]

## 2023-11-27 ENCOUNTER — Telehealth: Payer: Self-pay

## 2023-11-27 ENCOUNTER — Telehealth: Payer: Self-pay | Admitting: Surgery

## 2023-11-27 ENCOUNTER — Encounter: Payer: Self-pay | Admitting: Ophthalmology

## 2023-11-27 NOTE — Telephone Encounter (Signed)
 Received cardiac clearance from Dr. Florencio. Pt's risk assessment is low and is optimized for surgery.   Per Dr. Florencio: Pt is to hold Eliquis  for 3 days and Plavix  for 5 days and restart both medications 2 days after procedure.

## 2023-11-27 NOTE — Telephone Encounter (Signed)
 Patient has been advised of Pre-Admission date/time, and Surgery date at [facility].  Surgery Date: 01/01/24 Preadmission Testing Date: Preadmissions to call patient with appointment.    Patient informed of the scheduling process and surgery information given at time of office visit.   Patient has been made aware to call [phone], between 1-3:00pm the day before surgery, to find out what time to arrive for surgery.

## 2023-12-01 ENCOUNTER — Other Ambulatory Visit: Payer: Self-pay | Admitting: Family Medicine

## 2023-12-01 DIAGNOSIS — E1149 Type 2 diabetes mellitus with other diabetic neurological complication: Secondary | ICD-10-CM

## 2023-12-03 ENCOUNTER — Telehealth: Payer: Self-pay

## 2023-12-03 MED ORDER — LOKELMA 10 G PO PACK: 10.0000 g | PACK | Freq: Two times a day (BID) | ORAL | 0 refills | Status: AC

## 2023-12-03 NOTE — Telephone Encounter (Signed)
 Called to inform the pt of his lab results, medication recommendation and about having his labs rechecked. He verbally stated he understood and had know other questions

## 2023-12-03 NOTE — Discharge Instructions (Signed)

## 2023-12-04 ENCOUNTER — Ambulatory Visit
Admission: RE | Admit: 2023-12-04 | Discharge: 2023-12-04 | Disposition: A | Discharge status: Home / Self Care | Attending: Ophthalmology | Admitting: Ophthalmology

## 2023-12-04 ENCOUNTER — Ambulatory Visit: Payer: Self-pay | Admitting: Anesthesiology

## 2023-12-04 ENCOUNTER — Other Ambulatory Visit: Payer: Self-pay

## 2023-12-04 ENCOUNTER — Encounter
Admission: RE | Disposition: A | Discharge status: Home / Self Care | Payer: Self-pay | Source: Home / Self Care | Attending: Ophthalmology

## 2023-12-04 ENCOUNTER — Encounter: Payer: Self-pay | Admitting: Ophthalmology

## 2023-12-04 DIAGNOSIS — Z955 Presence of coronary angioplasty implant and graft: Secondary | ICD-10-CM | POA: Insufficient documentation

## 2023-12-04 DIAGNOSIS — Z87891 Personal history of nicotine dependence: Secondary | ICD-10-CM | POA: Diagnosis not present

## 2023-12-04 DIAGNOSIS — E669 Obesity, unspecified: Secondary | ICD-10-CM | POA: Diagnosis not present

## 2023-12-04 DIAGNOSIS — I10 Essential (primary) hypertension: Secondary | ICD-10-CM | POA: Diagnosis not present

## 2023-12-04 DIAGNOSIS — Z79899 Other long term (current) drug therapy: Secondary | ICD-10-CM | POA: Insufficient documentation

## 2023-12-04 DIAGNOSIS — H2512 Age-related nuclear cataract, left eye: Secondary | ICD-10-CM | POA: Diagnosis present

## 2023-12-04 DIAGNOSIS — Z7984 Long term (current) use of oral hypoglycemic drugs: Secondary | ICD-10-CM | POA: Insufficient documentation

## 2023-12-04 DIAGNOSIS — Z6835 Body mass index (BMI) 35.0-35.9, adult: Secondary | ICD-10-CM | POA: Diagnosis not present

## 2023-12-04 DIAGNOSIS — I25119 Atherosclerotic heart disease of native coronary artery with unspecified angina pectoris: Secondary | ICD-10-CM | POA: Diagnosis not present

## 2023-12-04 DIAGNOSIS — Z951 Presence of aortocoronary bypass graft: Secondary | ICD-10-CM | POA: Insufficient documentation

## 2023-12-04 DIAGNOSIS — E1136 Type 2 diabetes mellitus with diabetic cataract: Secondary | ICD-10-CM | POA: Diagnosis not present

## 2023-12-04 DIAGNOSIS — M1990 Unspecified osteoarthritis, unspecified site: Secondary | ICD-10-CM | POA: Diagnosis not present

## 2023-12-04 DIAGNOSIS — R0602 Shortness of breath: Secondary | ICD-10-CM | POA: Insufficient documentation

## 2023-12-04 DIAGNOSIS — E785 Hyperlipidemia, unspecified: Secondary | ICD-10-CM | POA: Diagnosis not present

## 2023-12-04 DIAGNOSIS — I252 Old myocardial infarction: Secondary | ICD-10-CM | POA: Insufficient documentation

## 2023-12-04 DIAGNOSIS — Z7901 Long term (current) use of anticoagulants: Secondary | ICD-10-CM | POA: Insufficient documentation

## 2023-12-04 HISTORY — DX: Type 2 diabetes mellitus with other diabetic neurological complication: E11.49

## 2023-12-04 HISTORY — PX: CATARACT EXTRACTION W/PHACO: SHX586

## 2023-12-04 HISTORY — DX: Other specified postprocedural states: Z86.79

## 2023-12-04 HISTORY — DX: Presence of aortocoronary bypass graft: Z95.1

## 2023-12-04 HISTORY — DX: Other persistent atrial fibrillation: I48.19

## 2023-12-04 HISTORY — DX: Chronic kidney disease, stage 2 (mild): N18.2

## 2023-12-04 HISTORY — DX: Atypical atrial flutter: I48.4

## 2023-12-04 HISTORY — DX: Arthrodesis status: Z98.1

## 2023-12-04 LAB — GLUCOSE, CAPILLARY: Glucose-Capillary: 104 mg/dL — ABNORMAL HIGH (ref 70–99)

## 2023-12-04 SURGERY — PHACOEMULSIFICATION, CATARACT, WITH IOL INSERTION
Anesthesia: Monitor Anesthesia Care | Site: Eye | Laterality: Left

## 2023-12-04 MED ORDER — SIGHTPATH DOSE#1 BSS IO SOLN
INTRAOCULAR | Status: DC | PRN
  Administered 2023-12-04: 107 mL via OPHTHALMIC

## 2023-12-04 MED ORDER — FENTANYL CITRATE (PF) 100 MCG/2ML IJ SOLN
INTRAMUSCULAR | Status: AC
Start: 2023-12-04 — End: 2023-12-04
  Filled 2023-12-04: qty 2

## 2023-12-04 MED ORDER — MIDAZOLAM HCL 2 MG/2ML IJ SOLN
INTRAMUSCULAR | Status: AC
  Filled 2023-12-04: qty 2

## 2023-12-04 MED ORDER — MOXIFLOXACIN HCL 0.5 % OP SOLN
OPHTHALMIC | Status: DC | PRN
  Administered 2023-12-04: .2 mL via OPHTHALMIC

## 2023-12-04 MED ORDER — SIGHTPATH DOSE#1 NA HYALUR & NA CHOND-NA HYALUR IO KIT
PACK | INTRAOCULAR | Status: DC | PRN
  Administered 2023-12-04: 1 via OPHTHALMIC

## 2023-12-04 MED ORDER — BRIMONIDINE TARTRATE-TIMOLOL 0.2-0.5 % OP SOLN
OPHTHALMIC | Status: DC | PRN
  Administered 2023-12-04: 1 [drp] via OPHTHALMIC

## 2023-12-04 MED ORDER — ARMC OPHTHALMIC DILATING DROPS
1.0000 | OPHTHALMIC | Status: DC | PRN
  Administered 2023-12-04 (×3): 1 via OPHTHALMIC

## 2023-12-04 MED ORDER — FENTANYL CITRATE (PF) 100 MCG/2ML IJ SOLN
INTRAMUSCULAR | Status: DC | PRN
  Administered 2023-12-04 (×2): 50 ug via INTRAVENOUS

## 2023-12-04 MED ORDER — TETRACAINE HCL 0.5 % OP SOLN
1.0000 [drp] | OPHTHALMIC | Status: DC | PRN
  Administered 2023-12-04 (×3): 1 [drp] via OPHTHALMIC

## 2023-12-04 MED ORDER — ARMC OPHTHALMIC DILATING DROPS
OPHTHALMIC | Status: AC
  Filled 2023-12-04: qty 0.5

## 2023-12-04 MED ORDER — LIDOCAINE HCL (PF) 2 % IJ SOLN
INTRAOCULAR | Status: DC | PRN
  Administered 2023-12-04: 4 mL via INTRAOCULAR

## 2023-12-04 MED ORDER — LACTATED RINGERS IV SOLN: INTRAVENOUS | Status: DC

## 2023-12-04 MED ORDER — SIGHTPATH DOSE#1 BSS IO SOLN
INTRAOCULAR | Status: DC | PRN
  Administered 2023-12-04: 15 mL via INTRAOCULAR

## 2023-12-04 MED ORDER — MIDAZOLAM HCL 2 MG/2ML IJ SOLN
INTRAMUSCULAR | Status: DC | PRN
  Administered 2023-12-04: 2 mg via INTRAVENOUS

## 2023-12-04 MED ORDER — TETRACAINE HCL 0.5 % OP SOLN
OPHTHALMIC | Status: AC
  Filled 2023-12-04: qty 4

## 2023-12-04 SURGICAL SUPPLY — 10 items
DISSECTOR HYDRO NUCLEUS 50X22 (MISCELLANEOUS) ×1 IMPLANT
DRSG TEGADERM 2-3/8X2-3/4 SM (GAUZE/BANDAGES/DRESSINGS) ×1 IMPLANT
FEE CATARACT SUITE SIGHTPATH (MISCELLANEOUS) ×1 IMPLANT
GLOVE BIOGEL PI IND STRL 8 (GLOVE) ×1 IMPLANT
GLOVE SURG LX STRL 7.5 STRW (GLOVE) ×1 IMPLANT
GLOVE SURG SYN 6.5 PF PI BL (GLOVE) ×1 IMPLANT
LENS IOL CLRN PANO TRC 4 15.5 IMPLANT
NDL FILTER BLUNT 18X1 1/2 (NEEDLE) ×1 IMPLANT
NEEDLE FILTER BLUNT 18X1 1/2 (NEEDLE) ×1 IMPLANT
SYR 3ML LL SCALE MARK (SYRINGE) ×1 IMPLANT

## 2023-12-04 NOTE — Transfer of Care (Signed)
 Immediate Anesthesia Transfer of Care Note  Patient: Nathan Hull  Procedure(s) Performed: PHACOEMULSIFICATION, CATARACT, WITH IOL INSERTION 20.77 02:11.7 (Left: Eye)  Patient Location: PACU  Anesthesia Type: MAC  Level of Consciousness: awake, alert  and patient cooperative  Airway and Oxygen Therapy: Patient Spontanous Breathing and Patient connected to supplemental oxygen  Post-op Assessment: Post-op Vital signs reviewed, Patient's Cardiovascular Status Stable, Respiratory Function Stable, Patent Airway and No signs of Nausea or vomiting  Post-op Vital Signs: Reviewed and stable  Complications: No notable events documented.

## 2023-12-04 NOTE — Anesthesia Postprocedure Evaluation (Signed)
 Anesthesia Post Note  Patient: Nathan Hull  Procedure(s) Performed: PHACOEMULSIFICATION, CATARACT, WITH IOL INSERTION 20.77 02:11.7 (Left: Eye)  Patient location during evaluation: PACU Anesthesia Type: MAC Level of consciousness: awake and alert Pain management: pain level controlled Vital Signs Assessment: post-procedure vital signs reviewed and stable Respiratory status: spontaneous breathing, nonlabored ventilation, respiratory function stable and patient connected to nasal cannula oxygen Cardiovascular status: stable and blood pressure returned to baseline Postop Assessment: no apparent nausea or vomiting Anesthetic complications: no   No notable events documented.   Last Vitals:  Vitals:   12/04/23 1118 12/04/23 1121  BP: 123/67 123/68  Pulse: 64 63  Resp: 13 18  Temp: 36.8 C 36.8 C  SpO2: 98% 98%    Last Pain:  Vitals:   12/04/23 1121  TempSrc:   PainSc: 0-No pain                 Allister Lessley C Desteny Freeman

## 2023-12-04 NOTE — Op Note (Signed)
 OPERATIVE NOTE  Nathan Hull [id number] 12/04/2023   PREOPERATIVE DIAGNOSIS: Nuclear sclerotic cataract left eye. H25.12   POSTOPERATIVE DIAGNOSIS: Nuclear sclerotic cataract left eye. H25.12   PROCEDURE:  Phacoemusification with Toric posterior chamber intraocular lens placement of the left eye  Ultrasound time: Procedure(s): PHACOEMULSIFICATION, CATARACT, WITH IOL INSERTION 20.77 02:11.7 (Left)  LENS:   Implant Name Type Inv. Item Serial No. Manufacturer Lot No. LRB No. Used Action  LENS IOL CLRN PANO TRC 4 15.5 - [id number]  LENS IOL CLRN PANO TRC 4 15.5 [id number] [employer]  Left 1 Implanted    [id number] Toric intraocular lens with 2.25 diopters of cylindrical power with axis orientation at 070 degrees.  SURGEON:  Feliciano HERO. Enola, MD   ANESTHESIA:  Topical with tetracaine  drops, augmented with 1% preservative-free intracameral lidocaine .   COMPLICATIONS:  None.   DESCRIPTION OF PROCEDURE:  The patient was identified in the holding room and transported to the operating room and placed in the supine position under the operating microscope.  The left eye was identified as the operative eye, which was prepped and draped in the usual sterile ophthalmic fashion.   A 1 millimeter clear-corneal paracentesis was made inferotemporally. Preservative-free 1% lidocaine  mixed with 1:1,000 bisulfite-free aqueous solution of epinephrine  was injected into the anterior chamber. The anterior chamber was then filled with Viscoat viscoelastic. A 2.4 millimeter keratome was used to make a clear-corneal incision superotemporally. A curvilinear capsulorrhexis was made with a cystotome and capsulorrhexis forceps. Balanced salt solution was used to hydrodissect and hydrodelineate the nucleus. Phacoemulsification was then used to remove the lens nucleus and epinucleus. The remaining cortex was then removed using the irrigation and aspiration handpiece. Provisc was then placed into the capsular bag to distend  it for lens placement. The Verion digital marker was used to align the implant at the intended axis.  A +15.50 D [id number] Toric lens was then injected into the capsular bag.  It was rotated clockwise until the axis marks on the lens were approximately 15 degrees in the counterclockwise direction to the intended alignment.  The viscoelastic was aspirated from the eye using the irrigation aspiration handpiece.  Then, a blunt chopper through the sideport incision was used to rotate the lens in a clockwise direction until the axis markings of the intraocular lens were lined up with the Verion alignment.  Balanced salt solution was then used to hydrate the wounds.   The anterior chamber was inflated to a physiologic pressure with balanced salt solution.  No wound leaks were noted. Moxifloxacin  was injected intracamerally.  Timolol  and Brimonidine  drops were applied to the eye.  The patient was taken to the recovery room in stable condition without complications of anesthesia or surgery.  Feliciano Hugger [place] 12/04/2023, 11:16 AM

## 2023-12-04 NOTE — Anesthesia Preprocedure Evaluation (Addendum)
 Anesthesia Evaluation  Patient identified by MRN, date of birth, ID band Patient awake    Reviewed: Allergy & Precautions, H&P , NPO status , Patient's Chart, lab work & pertinent test results  History of Anesthesia Complications (+) DIFFICULT AIRWAY, Family history of anesthesia reaction and history of anesthetic complications  Airway Mallampati: III  TM Distance: >3 FB Neck ROM: Full   Comment: Long beard, tied up by patient preop Dental no notable dental hx. (+) Chipped, Missing   Pulmonary neg pulmonary ROS, shortness of breath, former smoker   Pulmonary exam normal breath sounds clear to auscultation       Cardiovascular hypertension, + angina  + CAD and + Past MI  negative cardio ROS Normal cardiovascular exam Rhythm:Regular Rate:Normal  11-27-23 Received cardiac clearance from Dr. Florencio. Pt's risk assessment is low and is optimized for surgery.    Per Dr. Florencio: Pt is to hold Eliquis  for 3 days and Plavix  for 5 days and restart both medications 2 days after procedure.    Neuro/Psych  Neuromuscular disease negative neurological ROS  negative psych ROS   GI/Hepatic negative GI ROS, Neg liver ROS,,,  Endo/Other  negative endocrine ROSdiabetes    Renal/GU Renal diseasenegative Renal ROS  negative genitourinary   Musculoskeletal negative musculoskeletal ROS (+) Arthritis ,    Abdominal   Peds negative pediatric ROS (+)  Hematology negative hematology ROS (+)   Anesthesia Other Findings CAD (coronary artery disease) Hypertension Hyperlipidemia  Obesity Family history of anesthesia complication--mother had PONV Myocardial infarct Difficult intubation says he was told difficult intubation after CABG at [facility]. Glidescope used Arthritis Wears contact lenses Dyspnea when he walks a mile Diabetes mellitus without complication (History of kidney stones Carpal tunnel syndrome  Fusion of spine of cervical  region    Reproductive/Obstetrics negative OB ROS                              Anesthesia Physical Anesthesia Plan  ASA: 3  Anesthesia Plan: MAC   Post-op Pain Management:    Induction: Intravenous  PONV Risk Score and Plan:   Airway Management Planned: Natural Airway and Nasal Cannula  Additional Equipment:   Intra-op Plan:   Post-operative Plan:   Informed Consent: I have reviewed the patients History and Physical, chart, labs and discussed the procedure including the risks, benefits and alternatives for the proposed anesthesia with the patient or authorized representative who has indicated his/her understanding and acceptance.     Dental Advisory Given  Plan Discussed with: Anesthesiologist, CRNA and Surgeon  Anesthesia Plan Comments: (Patient consented for risks of anesthesia including but not limited to:  - adverse reactions to medications - damage to eyes, teeth, lips or other oral mucosa - nerve damage due to positioning  - sore throat or hoarseness - Damage to heart, brain, nerves, lungs, other parts of body or loss of life  Patient voiced understanding and assent.)         Anesthesia Quick Evaluation

## 2023-12-04 NOTE — H&P (Signed)
 [facility]   Primary Care Physician:  Donzella Lauraine SAILOR, DO Ophthalmologist: Dr. Feliciano Ober  Pre-Procedure History & Physical: HPI:  Nathan Hull is a 63 y.o. male here for cataract surgery.   Past Medical History:  Diagnosis Date   Arthritis    CAD (coronary artery disease)    Carpal tunnel syndrome    Diabetes mellitus without complication (HCC)    Difficult intubation 09/24/2013   reports told difficult after CABG at [facility].  Glidescope used   Dyspnea    walking a mile   Family history of anesthesia complication    mother had N/V after valve replacement   Fusion of spine of cervical region    History of kidney stones    Hyperlipidemia    Hypertension    Myocardial infarct [facility]) 2007   Obesity    Wears contact lenses     Past Surgical History:  Procedure Laterality Date   APPLICATION OF WOUND VAC Right 10/22/2013   Procedure: APPLICATION OF WOUND VAC;  Surgeon: Dallas KATHEE Jude, MD;  Location: [facility] OR;  Service: Vascular;  Laterality: Right;   C-Spine disc replacement x 2     06/16/2012   CARDIAC CATHETERIZATION     last week @ [facility]   cardiac stents     has been cathed x 4   Had  MI  in 2007   CARDIOVERSION N/A 05/15/2023   Procedure: CARDIOVERSION;  Surgeon: Dewane Shiner, DO;  Location: [facility] ORS;  Service: Cardiovascular;  Laterality: N/A;   CARPAL TUNNEL RELEASE     on right   COLONOSCOPY WITH PROPOFOL  N/A 04/26/2016   Procedure: COLONOSCOPY WITH PROPOFOL ;  Surgeon: Rogelia Copping, MD;  Location: [facility] SURGERY [facility]TR;  Service: Endoscopy;  Laterality: N/A;   CORONARY ARTERY BYPASS GRAFT N/A 09/24/2013   Procedure: CORONARY ARTERY BYPASS GRAFTING (CABG) x three, using LIMA to LAD, and right leg greater saphenous vein harvested endoscopically - SVG to Ramus, SVG to PL;  Surgeon: Maude Fleeta Ochoa, MD;  Location: [facility] OR;  Service: Open Heart Surgery;  Laterality: N/A;   HEMORRHOID SURGERY N/A 05/14/2018   Procedure: HEMORRHOIDECTOMY;  Surgeon: Tye Millet, DO;   Location: [facility] ORS;  Service: General;  Laterality: N/A;   I & D EXTREMITY Right 10/22/2013   Procedure: IRRIGATION AND DEBRIDEMENT EXTREMITY-RIGHT LEG;  Surgeon: Dallas KATHEE Jude, MD;  Location: [facility] OR;  Service: Vascular;  Laterality: Right;   INTRAOPERATIVE TRANSESOPHAGEAL ECHOCARDIOGRAM N/A 09/24/2013   Procedure: INTRAOPERATIVE TRANSESOPHAGEAL ECHOCARDIOGRAM;  Surgeon: Maude Fleeta Ochoa, MD;  Location: [facility] OR;  Service: Open Heart Surgery;  Laterality: N/A;   LEFT HEART CATH AND CORONARY ANGIOGRAPHY Left 05/01/2023   Procedure: LEFT HEART CATH AND CORONARY ANGIOGRAPHY;  Surgeon: Florencio Cara JONETTA, MD;  Location: [facility] INVASIVE CV LAB;  Service: Cardiovascular;  Laterality: Left;   POLYPECTOMY  04/26/2016   Procedure: POLYPECTOMY;  Surgeon: Rogelia Copping, MD;  Location: [facility] SUR[facility];  Service: Endoscopy;;   PTCA at [facility]     04/28/2005   SPINAL FUSION     vertebra, [facility] : Dr. Louis    TONSILLECTOMY      Prior to Admission medications   Medication Sig Start Date End Date Taking? Authorizing Provider  amiodarone  (PACERONE ) 200 MG tablet Take 200 mg by mouth 2 (two) times daily. 09/26/23 09/25/24  [provider]  apixaban  (ELIQUIS ) 5 MG TABS tablet Take 1 tablet (5 mg total) by mouth 2 (two) times daily. 05/01/23 11/24/23  Hudson, Caralyn, PA-C  Blood Glucose  Monitoring Suppl (ONE TOUCH ULTRA MINI) w/Device KIT USE AS DIRECTED 09/16/16   Chauvin, Clearance, PA  clopidogrel  (PLAVIX ) 75 MG tablet TAKE 1 TABLET BY MOUTH EVERY DAY 04/03/23   Emilio Marseille T, FNP  diltiazem  (CARDIZEM  CD) 120 MG 24 hr capsule Take 120 mg by mouth at bedtime. Patient not taking: Reported on 11/27/2023    [provider]  FARXIGA  10 MG TABS tablet TAKE 1 TABLET BY MOUTH EVERY DAY 04/03/23   Emilio Marseille DASEN, FNP  fenofibrate  (TRICOR ) 145 MG tablet Take 1 tablet (145 mg total) by mouth daily. 11/11/23   Donzella Lauraine SAILOR, DO  lisinopril  (ZESTRIL ) 40 MG tablet TAKE 1 TABLET BY MOUTH EVERY DAY 04/03/23   Emilio Marseille  T, FNP  metFORMIN  (GLUCOPHAGE -XR) 750 MG 24 hr tablet TAKE 1 TABLET (750 MG TOTAL) BY MOUTH 2 (TWO) TIMES DAILY BEFORE LUNCH AND SUPPER. 12/02/23   Pardue, Lauraine SAILOR, DO  Multiple Vitamins-Minerals (MULTIVITAMIN WITH MINERALS) tablet Take 1 tablet by mouth daily. Patient not taking: Reported on 11/27/2023    [provider]  ONE TOUCH ULTRA TEST test strip USE AS DIRECTED 10/28/16   Chauvin, Melchor, PA  [facility] DELICA LANCETS FINE MISC See admin instructions. 08/02/16   [provider]  rosuvastatin  (CRESTOR ) 20 MG tablet Take 1 tablet (20 mg total) by mouth daily. 11/11/23   Donzella Lauraine SAILOR, DO  sodium zirconium cyclosilicate  (LOKELMA ) 10 g PACK packet Take 10 g by mouth 2 (two) times daily for 2 days. 12/03/23 12/05/23  Donzella Lauraine SAILOR, DO  traMADol  (ULTRAM ) 50 MG tablet Take 1 tablet (50 mg total) by mouth 3 (three) times daily as needed. 11/11/23   Pardue, Lauraine SAILOR, DO  Vitamin D , Ergocalciferol , (DRISDOL ) 1.25 MG (50000 UNIT) CAPS capsule TAKE 1 CAPSULE (50,000 UNITS TOTAL) BY MOUTH EVERY 7 (SEVEN) DAYS 11/26/23   Donzella Lauraine SAILOR, DO    Allergies as of 11/19/2023   (No Known Allergies)    Family History  Problem Relation Age of Onset   Hypertension Mother    Rheumatic fever Mother        S/P MVR   Hypertension Father     Social History   Socioeconomic History   Marital status: Divorced    Spouse name: Not on file   Number of children: 2   Years of education: Not on file   Highest education level: Not on file  Occupational History   Not on file  Tobacco Use   Smoking status: Former    Current packs/day: 0.00    Types: Cigarettes    Quit date: 04/23/2005    Years since quitting: 18.6   Smokeless tobacco: Former    Types: Associate Professor status: Never Used  Substance and Sexual Activity   Alcohol  use: Yes    Alcohol /week: 1.0 standard drink of alcohol     Types: 1 Cans of beer per week    Comment: socially 1 beer a month   Drug use: Never   Sexual  activity: Not on file  Other Topics Concern   Not on file  Social History Narrative   Lives with his son, mother, niece & her spouse    Social Drivers of Corporate investment banker Strain: Medium Risk (10/02/2023)   Received from [facility]   Overall Financial Resource Strain (CARDIA)    Difficulty of Paying Living Expenses: Somewhat hard  Food Insecurity: No Food Insecurity (10/02/2023)   [facility]   Hunger Vital Sign    Within the past 12 months, you worried that your food would run out before you got the money to buy more.: Never true    Within the past 12 months, the food you bought just didn't last and you didn't have money to get more.: Never true  Transportation Needs: No Transportation Needs (10/02/2023)   Received from [facility] - [facility]    In the past 12 months, has lack of transportation kept you from medical appointments or from getting medications?: No    Lack of Transportation (Non-Medical): No  Physical Activity: Not on file  Stress: Not on file  Social Connections: Not on file  Intimate Partner Violence: Not on file    Review of Systems: See HPI, otherwise negative ROS  Physical Exam: Ht 6' (1.829 m)   Wt 119.3 kg   BMI 35.67 kg/m  General:   Alert, cooperative in NAD Head:  Normocephalic and atraumatic. Respiratory:  Normal work of breathing. Cardiovascular:  RRR  Impression/Plan: Nathan Hull is here for cataract surgery.  Risks, benefits, limitations, and alternatives regarding cataract surgery have been reviewed with the patient.  Questions have been answered.  All parties agreeable.   Feliciano Bryan Ober, MD  12/04/2023, 7:13 AM

## 2023-12-05 ENCOUNTER — Encounter: Payer: Self-pay | Admitting: Ophthalmology

## 2023-12-24 ENCOUNTER — Other Ambulatory Visit: Payer: Self-pay | Admitting: *Deleted

## 2023-12-25 ENCOUNTER — Other Ambulatory Visit: Payer: Self-pay

## 2023-12-25 ENCOUNTER — Encounter
Admission: RE | Admit: 2023-12-25 | Discharge: 2023-12-25 | Disposition: A | Discharge status: Home / Self Care | Source: Ambulatory Visit | Attending: Surgery | Admitting: Surgery

## 2023-12-25 ENCOUNTER — Other Ambulatory Visit

## 2023-12-25 VITALS — Ht 72.0 in | Wt 260.0 lb

## 2023-12-25 DIAGNOSIS — E1149 Type 2 diabetes mellitus with other diabetic neurological complication: Secondary | ICD-10-CM

## 2023-12-25 NOTE — Patient Instructions (Addendum)
 Your procedure is scheduled on: Thursday 01/01/24 Report to the Registration Desk on the 1st floor of the Medical Mall. To find out your arrival time, please call [phone] between 1PM - 3PM on: Wednesday 12/31/23 If your arrival time is 6:00 am, do not arrive before that time as the Medical Mall entrance doors do not open until 6:00 am.  REMEMBER: Instructions that are not followed completely may result in serious medical risk, up to and including death; or upon the discretion of your surgeon and anesthesiologist your surgery may need to be rescheduled.  Do not eat food after midnight the night before surgery.  No gum chewing or hard candies.  You may however, drink CLEAR liquids up to 2 hours before you are scheduled to arrive for your surgery. Do not drink anything within 2 hours of your scheduled arrival time.  Clear liquids include: - water   Do NOT drink anything that is not on this list.  One week prior to surgery: Stop Anti-inflammatories (NSAIDS) such as Advil , Aleve, Ibuprofen , Motrin , Naproxen, Naprosyn and Aspirin  based products such as Excedrin, Goody's Powder, BC Powder. Stop ANY OVER THE COUNTER supplements until after surgery.  You may however, continue to take Tylenol  if needed for pain up until the day of surgery.  Stop metFORMIN  (GLUCOPHAGE -XR) 750 2 days prior to surgery (Take last dose Monday 12/29/23 Stop FARXIGA  10 MG 3 days prior to surgery (Take last dose Sunday 9/28) Stop apixaban  (ELIQUIS ) 5 MG 3 days prior to surgery (take last dose Sunday 12/28/23) Stop clopidogrel  (PLAVIX ) 75 MG 5 days prior to surgery (Take last dose Friday 12/26/23)    Continue taking all of your other prescription medications up until the day of surgery.  ON THE DAY OF SURGERY ONLY TAKE THESE MEDICATIONS WITH SIPS OF WATER :  amiodarone  (PACERONE ) 200 MG  rosuvastatin  (CRESTOR ) 20 MG    No Alcohol  for 24 hours before or after surgery.  No Smoking including e-cigarettes for 24  hours before surgery.  No chewable tobacco products for at least 6 hours before surgery.  No nicotine patches on the day of surgery.  Do not use any recreational drugs for at least a week (preferably 2 weeks) before your surgery.  Please be advised that the combination of cocaine and anesthesia may have negative outcomes, up to and including death. If you test positive for cocaine, your surgery will be cancelled.  On the morning of surgery brush your teeth with toothpaste and water , you may rinse your mouth with mouthwash if you wish. Do not swallow any toothpaste or mouthwash.  Use CHG Soap or wipes as directed on instruction sheet.  Do not wear jewelry, make-up, hairpins, clips or nail polish.  For welded (permanent) jewelry: bracelets, anklets, waist bands, etc.  Please have this removed prior to surgery.  If it is not removed, there is a chance that hospital personnel will need to cut it off on the day of surgery.  Do not wear lotions, powders, or perfumes.   Do not shave body hair from the neck down 48 hours before surgery.  Contact lenses, hearing aids and dentures may not be worn into surgery.  Do not bring valuables to the hospital. [facility] is not responsible for any missing/lost belongings or valuables.   Notify your doctor if there is any change in your medical condition (cold, fever, infection).  Wear comfortable clothing (specific to your surgery type) to the hospital.  After surgery, you can help prevent lung complications by  doing breathing exercises.  Take deep breaths and cough every 1-2 hours. Your doctor may order a device called an Incentive Spirometer to help you take deep breaths. When coughing or sneezing, hold a pillow firmly against your incision with both hands. This is called "splinting." Doing this helps protect your incision. It also decreases belly discomfort.  If you are being admitted to the hospital overnight, leave your suitcase in the car.  After surgery it may be brought to your room.  In case of increased patient census, it may be necessary for you, the patient, to continue your postoperative care in the Same Day Surgery department.  If you are being discharged the day of surgery, you will not be allowed to drive home. You will need a responsible individual to drive you home and stay with you for 24 hours after surgery.   If you are taking public transportation, you will need to have a responsible individual with you.  Please call the Pre-admissions Testing Dept. at [phone] if you have any questions about these instructions.  Surgery Visitation Policy:  Patients having surgery or a procedure may have two visitors.  Children under the age of 16 must have an adult with them who is not the patient.  Inpatient Visitation:    Visiting hours are 7 a.m. to 8 p.m. Up to four visitors are allowed at one time in a patient room. The visitors may rotate out with other people during the day.  One visitor age 57 or older may stay with the patient overnight and must be in the room by 8 p.m.   Merchandiser, retail to address health-related social needs:  [email or web address]                                                                                                            Preparing for Surgery with CHLORHEXIDINE  GLUCONATE (CHG) Soap  Chlorhexidine  Gluconate (CHG) Soap  o An antiseptic cleaner that kills germs and bonds with the skin to continue killing germs even after washing  o Used for showering the night before surgery and morning of surgery  Before surgery, you can play an important role by reducing the number of germs on your skin.  CHG (Chlorhexidine  gluconate) soap is an antiseptic cleanser which kills germs and bonds with the skin to continue killing germs even after washing.  Please do not use if you have an allergy to CHG or antibacterial soaps. If your skin becomes  reddened/irritated stop using the CHG.  1. Shower the NIGHT BEFORE SURGERY and the MORNING OF SURGERY with CHG soap.  2. If you choose to wash your hair, wash your hair first as usual with your normal shampoo.  3. After shampooing, rinse your hair and body thoroughly to remove the shampoo.  4. Use CHG as you would any other liquid soap. You can apply CHG directly to the skin and wash gently with a scrungie or a clean washcloth.  5. Apply the CHG soap to your body only from the neck down. Do not  use on open wounds or open sores. Avoid contact with your eyes, ears, mouth, and genitals (private parts). Wash face and genitals (private parts) with your normal soap.  6. Wash thoroughly, paying special attention to the area where your surgery will be performed.  7. Thoroughly rinse your body with warm water .  8. Do not shower/wash with your normal soap after using and rinsing off the CHG soap.  9. Pat yourself dry with a clean towel.  10. Wear clean pajamas to bed the night before surgery.  12. Place clean sheets on your bed the night of your first shower and do not sleep with pets.  13. Shower again with the CHG soap on the day of surgery prior to arriving at the hospital.  14. Do not apply any deodorants/lotions/powders.  15. Please wear clean clothes to the hospital.

## 2023-12-25 NOTE — Patient Instructions (Signed)
 Your procedure is scheduled on: Thursday 01/01/24 Report to the Registration Desk on the 1st floor of the [facility]. To find out your arrival time, please call [phone] between 1PM - 3PM on: Wednesday 12/31/23  If your arrival time is 6:00 am, do not arrive before that time as the Medical Mall entrance doors do not open until 6:00 am.  REMEMBER: Instructions that are not followed completely may result in serious medical risk, up to and including death; or upon the discretion of your surgeon and anesthesiologist your surgery may need to be rescheduled.  Do not eat food after midnight the night before surgery.  No gum chewing or hard candies.  You may however, drink CLEAR liquids up to 2 hours before you are scheduled to arrive for your surgery. Do not drink anything within 2 hours of your scheduled arrival time.  Clear liquids include: - water   Do NOT drink anything that is not on this list.  One week prior to surgery: Stop Anti-inflammatories (NSAIDS) such as Advil , Aleve, Ibuprofen , Motrin , Naproxen, Naprosyn and Aspirin  based products such as Excedrin, Goody's Powder, BC Powder. Stop ANY OVER THE COUNTER supplements until after surgery.   You may however, continue to take Tylenol  if needed for pain up until the day of surgery.  Stop metFORMIN  (GLUCOPHAGE -XR) 750 MG 2 days prior to surgery (take last dose Monday 12/29/23) Stop FARXIGA  10 mg 3 days prior to surgery (take last dose Sunday 12/29/23) Stop apixaban  (ELIQUIS ) 5 MG 3 days prior to surgery (take last dose Sunday 12/28/23) Stop clopidogrel  (PLAVIX ) 75 MG 5 days prior to surgery (take last dose Friday 12/26/23)  Continue taking all of your other prescription medications up until the day of surgery.  ON THE DAY OF SURGERY ONLY TAKE THESE MEDICATIONS WITH SIPS OF WATER :  amiodarone  (PACERONE ) 200 MG    No Alcohol  for 24 hours before or after surgery.  No Smoking including e-cigarettes for 24 hours before surgery.  No  chewable tobacco products for at least 6 hours before surgery.  No nicotine patches on the day of surgery.  Do not use any recreational drugs for at least a week (preferably 2 weeks) before your surgery.  Please be advised that the combination of cocaine and anesthesia may have negative outcomes, up to and including death. If you test positive for cocaine, your surgery will be cancelled.  On the morning of surgery brush your teeth with toothpaste and water , you may rinse your mouth with mouthwash if you wish. Do not swallow any toothpaste or mouthwash.  Use CHG Soap or wipes as directed on instruction sheet.  Do not wear jewelry, make-up, hairpins, clips or nail polish.  For welded (permanent) jewelry: bracelets, anklets, waist bands, etc.  Please have this removed prior to surgery.  If it is not removed, there is a chance that hospital personnel will need to cut it off on the day of surgery.  Do not wear lotions, powders, or perfumes.   Do not shave body hair from the neck down 48 hours before surgery.  Contact lenses, hearing aids and dentures may not be worn into surgery.  Do not bring valuables to the hospital. [facility] is not responsible for any missing/lost belongings or valuables.    Notify your doctor if there is any change in your medical condition (cold, fever, infection).  Wear comfortable clothing (specific to your surgery type) to the hospital.  After surgery, you can help prevent lung complications by doing breathing exercises.  Take deep breaths and cough every 1-2 hours. Your doctor may order a device called an Incentive Spirometer to help you take deep breaths. When coughing or sneezing, hold a pillow firmly against your incision with both hands. This is called "splinting." Doing this helps protect your incision. It also decreases belly discomfort.  If you are being admitted to the hospital overnight, leave your suitcase in the car. After surgery it may be  brought to your room.  In case of increased patient census, it may be necessary for you, the patient, to continue your postoperative care in the Same Day Surgery department.  If you are being discharged the day of surgery, you will not be allowed to drive home. You will need a responsible individual to drive you home and stay with you for 24 hours after surgery.   If you are taking public transportation, you will need to have a responsible individual with you.  Please call the Pre-admissions Testing Dept. at [phone] if you have any questions about these instructions.  Surgery Visitation Policy:  Patients having surgery or a procedure may have two visitors.  Children under the age of 55 must have an adult with them who is not the patient.  Inpatient Visitation:    Visiting hours are 7 a.m. to 8 p.m. Up to four visitors are allowed at one time in a patient room. The visitors may rotate out with other people during the day.  One visitor age 91 or older may stay with the patient overnight and must be in the room by 8 p.m.   Merchandiser, retail to address health-related social needs:  [email or web address]                                                                                                             Preparing for Surgery with CHLORHEXIDINE  GLUCONATE (CHG) Soap  Chlorhexidine  Gluconate (CHG) Soap  o An antiseptic cleaner that kills germs and bonds with the skin to continue killing germs even after washing  o Used for showering the night before surgery and morning of surgery  Before surgery, you can play an important role by reducing the number of germs on your skin.  CHG (Chlorhexidine  gluconate) soap is an antiseptic cleanser which kills germs and bonds with the skin to continue killing germs even after washing.  Please do not use if you have an allergy to CHG or antibacterial soaps. If your skin becomes reddened/irritated stop using the  CHG.  1. Shower the NIGHT BEFORE SURGERY and the MORNING OF SURGERY with CHG soap.  2. If you choose to wash your hair, wash your hair first as usual with your normal shampoo.  3. After shampooing, rinse your hair and body thoroughly to remove the shampoo.  4. Use CHG as you would any other liquid soap. You can apply CHG directly to the skin and wash gently with a scrungie or a clean washcloth.  5. Apply the CHG soap to your body only from the neck down. Do not use on open  wounds or open sores. Avoid contact with your eyes, ears, mouth, and genitals (private parts). Wash face and genitals (private parts) with your normal soap.  6. Wash thoroughly, paying special attention to the area where your surgery will be performed.  7. Thoroughly rinse your body with warm water .  8. Do not shower/wash with your normal soap after using and rinsing off the CHG soap.  9. Pat yourself dry with a clean towel.  10. Wear clean pajamas to bed the night before surgery.  12. Place clean sheets on your bed the night of your first shower and do not sleep with pets.  13. Shower again with the CHG soap on the day of surgery prior to arriving at the hospital.  14. Do not apply any deodorants/lotions/powders.  15. Please wear clean clothes to the hospital.

## 2023-12-30 ENCOUNTER — Encounter: Payer: Self-pay | Admitting: Surgery

## 2023-12-30 DIAGNOSIS — K635 Polyp of colon: Secondary | ICD-10-CM

## 2023-12-30 NOTE — Progress Notes (Signed)
 Perioperative / Anesthesia Services  Pre-Admission Testing Clinical Review / Pre-Operative Anesthesia Consult  Date: 12/31/23  PATIENT DEMOGRAPHICS: Name: Nathan Hull DOB: 05-25-1960 MRN:   [id number]  Note: Available PAT nursing documentation and vital signs have been reviewed. Clinical nursing staff has updated patient's PMH/PSHx, current medication list, and drug allergies/intolerances to ensure complete and comprehensive history available to assist care teams in MDM as it pertains to the aforementioned surgical procedure and anticipated anesthetic course. Extensive review of available clinical information personally performed.  PMH and PSHx updated with any diagnoses/procedures that  may have been inadvertently omitted during his intake with the pre-admission testing department's nursing staff.  PLANNED SURGICAL PROCEDURE(S):   Case: [id number] Date/Time: 01/01/24 1401   Procedure: EXCISION, CYST, NECK (Left)   Anesthesia type: General   Diagnosis: Epidermoid cyst [L72.0]   Pre-op diagnosis: Cyst of left posterior neck   Location: [facility] OR ROOM 06 / [facility]   Surgeons: Desiderio Schanz, MD        CLINICAL DISCUSSION: Nathan Hull is a 63 y.o. male who is submitted for pre-surgical anesthesia review and clearance prior to him undergoing the above procedure. Patient is a Former Smoker (quit 04/2005). Pertinent PMH includes: CAD (s/p CABG), inferolateral STEMI, atrial fibrillation/flutter, TIA, RBBB, atypical angina, HTN, HLD, T2DM, CKD-II, dyspnea, OSAH (not on nocturnal PAP therapy). GERD (no daily Tx), Bochdalek hernia, nephrolithiasis, OA, cervical DDD (s/p C5-C7 fusion).  Patient is followed by cardiology Philippe, MD). He was last seen in the cardiology clinic on 10/27/2023; notes reviewed. At the time of his clinic visit, patient doing well overall from a cardiovascular perspective. Patient denied any chest pain, shortness of breath, PND,  orthopnea, palpitations, significant peripheral edema, weakness, fatigue, vertiginous symptoms, or presyncope/syncope. Patient with a past medical history significant for cardiovascular diagnoses. Documented physical exam was grossly benign, providing no evidence of acute exacerbation and/or decompensation of the patient's known cardiovascular conditions.  Past medical history significant for TIA.  Patient unsure of the date of this event.  He has no residual neurological deficits following reported TIA.SABRA  Patient suffered an anterolateral STEMI on 04/28/2005.  He underwent diagnostic LEFT heart catheterization revealing multivessel CAD; 75% proximal RCA, 55% distal RCA, and 25% LM.  He subsequently underwent PCI placing a BMS (unknown type/brand) to the proximal RCA lesion.  Procedure yielded excellent angiographic result and TIMI-3 flow.  Patient underwent repeat diagnostic LEFT heart catheterization on 07/10/2006.  Procedure revealed multivessel CAD; 85% D1 and 99% distal RCA.  PCI was subsequently performed placing a 3.0 x 13 mm Cypher DES x 1 to the D1 lesion and a 3.5 x 18 mm Cypher DES x 1 to the distal RCA lesion.  Procedure yielded excellent angiographic result and TIMI-3 flow.  Repeat diagnostic LEFT heart catheterization was performed on 11/27/2009. Procedure revealing single-vessel CAD with multiple lesions; 99% mid RCA and 90% ISR of the previously placed distal RCA stent.  PCI was performed placing a 3.5 x 28 mm Xience V DES x 1 to the mid RCA.  PTCA was performed to the distal RCA lesion.  Procedure yielded excellent angiographic result and TIMI-3 flow.  Given patient's history of significant obstructive coronary artery disease, he ultimately required revascularization on 09/24/2013.  LIMA-LAD, SVG-RI, and SVG-PL bypass grafts were placed.  Stress echocardiogram was performed on 04/01/2023 revealing a normal left ventricular systolic function and EF.  There were no regional wall motion  abnormalities.  Trivial to mild pan valvular regurgitation was  observed.  Patient able to achieve a maximum workload of 7 METS during stress.  Blood pressure demonstrated appropriate response to exercise.  This recent diagnostic LEFT heart catheterization was performed on 05/01/2023.  Study revealed multivessel CAD; 100% proximal RCA, 100% distal RCA, 80% distal-proximal LM, 100% proximal-mid LM, 100% RPDA, 95% ostial-mid LM, and 75% mid LAD.  SVG to what appeared to be the diagonal with a 75% origin lesion, otherwise widely patent.  SVG to the RCA was 100% occluded.  The decision was made to defer further intervention opting for medical management.  Cardiac MRI was performed on 08/26/2023 revealing a normal left ventricular systolic function with a hyperdynamic LVEF of 66%.  There were no regional wall motion abnormalities.  Right ventricle normal in size with normal systolic function.  LEFT atrium moderately enlarged and RIGHT atrium mildly enlarged.  There were 2 areas of hyperenhancement in the basal to mid inferior, inferolateral, and apical inferior walls consistent  with small subendocardial infarcts (<25% transmural) in the perfusion territory of the RCA.  This was felt to be possibly embolic in origin.  Additionally, there was a small area of nonspecific scar in the mid inferoseptal wall, also likely embolic in origin.  Patient with a longstanding history of atrial fibrillation/flutter.  He underwent DCCV procedure on 05/15/2023, at which time he received a single 360 J synchronized cardioversion converting him to NSR.  Due to a refractory ERAF within 3 days of DCCV, patient required further treatment with PVI ablation on 08/27/2023.  Again, patient with an atrial fibrillation diagnosis; CHA2DS2-VASc Score = 5 (HTN, TIA x 2, prior MI/vascular disease, T2DM).as previously mentioned, he has undergone DCCV and PVI ablation this year.  His rate and rhythm are currently being maintained on oral  amiodarone . He is chronically anticoagulated using apixaban .additionally, with his older generation stent, patient is also on daily antithrombotic therapy using clopidogrel .  He is reported to be compliant with therapy with no evidence or reports of GI/GU bleeding.  Blood pressure mildly elevated at 142/84 mmHg on currently prescribed ACEi (lisinopril ) monotherapy. He is on rosuvastatin  + fenofibrate  for his HLD diagnosis and further ASCVD prevention. T2DM suboptimally on currently prescribed regimen; last HgbA1c was 7.4% when checked on 11/11/2023. In the setting of known cardiovascular diagnoses and concurrent T2DM, patient is taking an SGLT2i (dapagliflozin ) for added cardiovascular and renovascular protection. Patient does not have an OSAH diagnosis. Patient is able to complete all of his  ADL/IADLs without cardiovascular limitation.  Per the DASI, patient is able to achieve at least 4 METS of physical activity without experiencing any significant degree of angina/anginal equivalent symptoms.  No changes were made to his medication regimen during his visit with cardiology.  Patient scheduled to follow-up with outpatient cardiology in 6 months or sooner if needed.  Lamar JONETTA Barrio is scheduled for an elective EXCISION, CYST, NECK (Left) on 01/01/2024 with Dr. Aloysius Plant, MD. Given patient's past medical history significant for cardiovascular diagnoses, presurgical cardiac clearance was sought by the PAT team. Per cardiology, this patient is optimized for surgery and may proceed with the planned procedural course with a LOW risk of significant perioperative cardiovascular complications.  Again, this patient is on both daily anticoagulation and antithrombotic therapies.  He has been instructed on recommendations for holding his clopidogrel  for 5 days (last dose 12/26/2023) and his apixaban  for 3 days (last dose 12/28/2023) prior to his procedure with plans to restart since postoperatively respectively  minimized by his primary attending surgeon.  Patient denies  previous perioperative complications with anesthesia in the past.  Patient does have a (+) history of PONV and a first-degree relative (mother).  In review his EMR, it is noted that patient underwent a MAC anesthetic course at [facility] ([facility] in 12/2023 without documented complications.   MOST RECENT VITAL SIGNS:    12/25/2023    8:26 AM 12/04/2023   11:21 AM 12/04/2023   11:18 AM  Vitals with BMI  Height 6' 0    Weight 260 lbs    BMI 35.25    Systolic  123 123  Diastolic  68 67  Pulse  63 64   PROVIDERS/SPECIALISTS: NOTE: Primary physician provider listed below. Patient may have been seen by APP or partner within same practice.   PROVIDER ROLE / SPECIALTY LAST [place]ERLEAN Desiderio Schanz, MD General Surgery (Surgeon) 11/24/2023  Donzella Lauraine SAILOR, DO Primary Care Provider 11/11/2023  Florencio Shine, MD Cardiology 10/27/2023  Melchor Agent, MD Electrophysiology 12/17/2023  Theotis Chancellor, MD Pulmonary Medicine 11/20/2023   ALLERGIES: No Known Allergies  CURRENT HOME MEDICATIONS: No current facility-administered medications for this encounter.    acetaminophen  (TYLENOL ) 500 MG tablet   amiodarone  (PACERONE ) 200 MG tablet   apixaban  (ELIQUIS ) 5 MG TABS tablet   clopidogrel  (PLAVIX ) 75 MG tablet   FARXIGA  10 MG TABS tablet   fenofibrate  (TRICOR ) 145 MG tablet   lisinopril  (ZESTRIL ) 40 MG tablet   metFORMIN  (GLUCOPHAGE -XR) 750 MG 24 hr tablet   rosuvastatin  (CRESTOR ) 20 MG tablet   traMADol  (ULTRAM ) 50 MG tablet   Vitamin D , Ergocalciferol , (DRISDOL ) 1.25 MG (50000 UNIT) CAPS capsule   Blood Glucose Monitoring Suppl (ONE TOUCH ULTRA MINI) w/Device KIT   ONE TOUCH ULTRA TEST test strip   ONETOUCH DELICA LANCETS FINE MISC   HISTORY: Past Medical History:  Diagnosis Date   Aortic atherosclerosis    Arthritis    Atrial fibrillation and flutter (HCC)    a.) CHA2DS2VASc = 5 (HTN, TIA x 2, prior MI/vascular  disease, T2DM) as of 12/30/2023; b.) s/p DCCV (360 J x 1) --> NSR with ERAF within 3 days; c.) s/p PVI ablation 08/27/2023; d.) rate/rhythm maintained on oral amiodarone ; chronically anticoagulated with apixaban    Atypical angina    Bochdalek hernia    CAD (coronary artery disease) 2007   a.) s/p inferolateral STEMI 04/28/2005 - IRA was 75% pRCA (BMS); b.) LHC/PCI 07/10/2006: 85% D1 (3.0 x 13 mm Cypher DES), 99% dRCA (3.5 x 18 mm Cypher DES), c.) LHC/PCI 11/27/2009: 99% mRCA (3.5 x 28 mm Xience V DES), 90% ISR dRCA (PTCA); d.) s/p 3v CABG 09/24/2013   Carpal tunnel syndrome    Chronic kidney disease, stage 2 (mild)    Colon polyps    DDD (degenerative disc disease), cervical    a.) s/p C5-C7 fusion   Difficult intubation 09/24/2013   a.) mallampati II, limited cervical ROM; Glidescope used   Dyspnea    Family history of anesthesia complication    a.) PONV in 1st degree relative (mother)   GERD (gastroesophageal reflux disease)    Hemorrhoids    History of kidney stones    Hyperlipidemia    Hypertension    Long term current use of amiodarone     Long term current use of clopidogrel     Obesity    On apixaban  therapy    OSA (obstructive sleep apnea)    a.) PSG/ONO pending; not currently on nocturnal PAP therapy   Poorly controlled type 2 diabetes mellitus (HCC)  RBBB    S/P CABG x 3 09/24/2013   a.) LIMA-LAD, SVG-RI, SVG-PL   ST elevation myocardial infarction (STEMI) of inferolateral wall (HCC) 04/28/2005   a.) LHC/PCI 04/28/2005: 75% pRCA (unknown type/brand BMS), 55% dRCA, 25% LM   Status post coronary artery stent placement 2007   a.) TOTAL # stents as of 12/30/2023: 4   TIA (transient ischemic attack)    Vitamin D  deficiency    Wears contact lenses    Past Surgical History:  Procedure Laterality Date   ANTERIOR FUSION CERVICAL SPINE N/A 09/14/2008   Procedure: ANTERIOR FUSION CERVICAL SPINE (C5-C7); Location: [facility]; Surgeon: Malcolm, MD   APPLICATION OF WOUND VAC Right  10/22/2013   Procedure: APPLICATION OF WOUND VAC;  Surgeon: Dallas KATHEE Jude, MD;  Location: [facility] OR;  Service: Vascular;  Laterality: Right;   C-Spine disc replacement x 2     06/16/2012   CARDIOVERSION N/A 05/15/2023   Procedure: CARDIOVERSION;  Surgeon: Dewane Shiner, DO;  Location: [facility] ORS;  Service: Cardiovascular;  Laterality: N/A;   CARPAL TUNNEL RELEASE Right    CATARACT EXTRACTION W/PHACO Left 12/04/2023   Procedure: PHACOEMULSIFICATION, CATARACT, WITH IOL INSERTION 20.77 02:11.7;  Surgeon: Enola Feliciano Hugger, MD;  Location: [facility];  Service: Ophthalmology;  Laterality: Left;   COLONOSCOPY WITH PROPOFOL  N/A 04/26/2016   Procedure: COLONOSCOPY WITH PROPOFOL ;  Surgeon: Rogelia Copping, MD;  Location: [facility];  Service: Endoscopy;  Laterality: N/A;   CORONARY ANGIOPLASTY WITH STENT PLACEMENT Left 04/28/2005   Procedure: CORONARY ANGIOPLASTY WITH STENT PLACEMENT; Location: [facility]   CORONARY ANGIOPLASTY WITH STENT PLACEMENT Left 11/27/2009   Procedure: CORONARY ANGIOPLASTY WITH STENT PLACEMENT; Location: [facility]; Surgeon: Margie Lovelace, MD   CORONARY ANGIOPLASTY WITH STENT PLACEMENT N/A 07/10/2006   Procedure: CORONARY ANGIOPLASTY WITH STENT PLACEMENT; Location: [facility]; Surgeons: Denyse Bathe, MD (diagnostic) and Margie Lovelace, MD (interventional)   CORONARY ARTERY BYPASS GRAFT N/A 09/24/2013   Procedure: CORONARY ARTERY BYPASS GRAFTING (CABG) x three, using LIMA to LAD, and right leg greater saphenous vein harvested endoscopically - SVG to Ramus, SVG to PL;  Surgeon: Maude Fleeta Ochoa, MD;  Location: [facility] OR;  Service: Open Heart Surgery;  Laterality: N/A;   HEMORRHOID SURGERY N/A 05/14/2018   Procedure: HEMORRHOIDECTOMY;  Surgeon: Tye Millet, DO;  Location: [facility] ORS;  Service: General;  Laterality: N/A;   I & D EXTREMITY Right 10/22/2013   Procedure: IRRIGATION AND DEBRIDEMENT EXTREMITY-RIGHT LEG;  Surgeon: Dallas KATHEE Jude, MD;  Location: [facility] OR;  Service: Vascular;   Laterality: Right;   INTRAOPERATIVE TRANSESOPHAGEAL ECHOCARDIOGRAM N/A 09/24/2013   Procedure: INTRAOPERATIVE TRANSESOPHAGEAL ECHOCARDIOGRAM;  Surgeon: Maude Fleeta Ochoa, MD;  Location: [facility] OR;  Service: Open Heart Surgery;  Laterality: N/A;   LEFT HEART CATH AND CORONARY ANGIOGRAPHY Left 05/01/2023   Procedure: LEFT HEART CATH AND CORONARY ANGIOGRAPHY;  Surgeon: Lovelace Cara BIRCH, MD;  Location: [facility] INVASIVE CV LAB;  Service: Cardiovascular;  Laterality: Left;   POLYPECTOMY  04/26/2016   Procedure: POLYPECTOMY;  Surgeon: Rogelia Copping, MD;  Location: [facility];  Service: Endoscopy;;   TONSILLECTOMY     Family History  Problem Relation Age of Onset   Hypertension Mother    Rheumatic fever Mother        S/P MVR   Hypertension Father    Social History   Tobacco Use   Smoking status: Former    Current packs/day: 0.00    Types: Cigarettes    Quit date: 04/23/2005    Years since quitting: 18.7  Smokeless tobacco: Former    Types: Chew  Substance Use Topics   Alcohol  use: Yes    Alcohol /week: 1.0 standard drink of alcohol     Types: 1 Cans of beer per week    Comment: socially 1 beer a month   LABS:  Lab Results  Component Value Date   NA 138 11/11/2023   CL 101 11/11/2023   K 5.4 (H) 11/11/2023   CO2 21 11/11/2023   BUN 20 11/11/2023   CREATININE 1.33 (H) 11/11/2023   EGFR 61 11/11/2023   CALCIUM  10.0 11/11/2023   ALBUMIN  4.6 11/11/2023   GLUCOSE 112 (H) 11/11/2023   Component Ref Range & Units 08/26/2023  WBC (White Blood Cell Count) 3.2 - 9.8 x10^9/L 8.7  Hemoglobin 13.7 - 17.3 g/dL 85.8  Hematocrit 60.9 - 49.0 % 43.1  Platelets 150 - 450 x10^9/L 300  MCV (Mean Corpuscular Volume) 80 - 98 fL 87  MCH (Mean Corpuscular Hemoglobin) 26.5 - 34.0 pg 28.5  MCHC (Mean Corpuscular Hemoglobin Concentration) 31.5 - 36.3 % 32.7  RBC (Red Blood Cell Count) 4.37 - 5.74 x10^12/L 4.95  RDW-CV (Red Cell Distribution Width) 11.5 - 14.5 % 12.3  NRBC (Nucleated Red  Blood Cell Count) 0 x10^9/L 0.00  NRBC % (Nucleated Red Blood Cell %) % 0.0  MPV (Mean Platelet Volume) 7.2 - 11.7 fL 9.6    ECG: Date: 12/17/2023  Time ECG obtained: 1029 AM Rate: 66 bpm Rhythm: Normal sinus rhythm with RBBB Axis (leads I and aVF): normal Intervals: PR 184 ms. QRS 146 ms. QTc 446 ms. ST segment and T wave changes: Inferolateral T wave abnormalities Evidence of a possible, age undetermined, prior infarct:  No Comparison: Similar to previous tracing obtained on 10/27/2023 NOTE: Tracing obtained at [facility]; unable for review. Above based on cardiologist's interpretation.    IMAGING / PROCEDURES: CARDIAC MRI HEART MORPHOLOGY AND FUNCTION WITH AND WITHOUT CONTRAST performed on 08/26/2023 The left ventricle is normal in cavity size. There is mild concentric LV hypertrophy. Global systolic function is normal with an LV ejection fraction calculated at 66%. There are no regional wall motion abnormalities.  The right ventricle is normal in cavity size, wall thickness, and systolic function.  The left atrium is moderately enlarged. The right atrium is mildly enlarged.  The aortic valve is trileaflet in morphology. There is no significant aortic valve stenosis or regurgitation. There is mild tricuspid valve regurgitation and mild mitral valve regurgitation. Delayed enhancement imaging is abnormal. There are 2 small regions of hyperenhancement in the basal-to-mid inferior and inferolateral walls, and apical inferior wall, consistent with small subendocardial infarcts (< 25% transmural) in the perfusion territory of the RCA (possibly embolic in origin). Additionally, there is a small dot of nonspecific scar in the mid inferoseptal wall, also likely embolic in origin.  No intracardiac thrombus visualized.  The pericardium is normal in thickness. There is no significant pericardial effusion.   STRESS ECHOCARDIOGRAM performed on 04/01/2023 Normal left ventricular systolic  function and EF Trivial aortic and pulmonic valve regurgitation Mild mitral and tricuspid valve regurgitation Normal gradients; no valvular stenosis Maximum workload of 7 METS was achieved during exercise Blood pressure demonstrated a normal response to exercise   IMPRESSION AND PLAN: Nathan Hull has been referred for pre-anesthesia review and clearance prior to him undergoing the planned anesthetic and procedural courses. Available labs, pertinent testing, and imaging results were personally reviewed by me in preparation for upcoming operative/procedural course. [facility] medical record has been updated following extensive  record review and patient interview with PAT staff.   This patient has been appropriately cleared by cardiology with an overall LOW risk of patient experiencing significant perioperative cardiovascular complications. Based on clinical review performed today (12/31/23), barring any significant acute changes in the patient's overall condition, it is anticipated that he will be able to proceed with the planned surgical intervention. Any acute changes in clinical condition may necessitate his procedure being postponed and/or cancelled. Patient will meet with anesthesia team (MD and/or CRNA) on the day of his procedure for preoperative evaluation/assessment. Questions regarding anesthetic course will be fielded at that time.   Pre-surgical instructions were reviewed with the patient during his PAT appointment, and questions were fielded to satisfaction by PAT clinical staff. He has been instructed on which medications that he will need to hold prior to surgery, as well as the ones that have been deemed safe/appropriate to take on the day of his procedure. As part of the general education provided by PAT, patient made aware both verbally and in writing, that he would need to abstain from the use of any illegal substances during his perioperative course. He was advised that failure to  follow the provided instructions could necessitate case cancellation or result in serious perioperative complications up to and including death. Patient encouraged to contact PAT and/or his surgeon's office to discuss any questions or concerns that may arise prior to surgery; verbalized understanding.   Dorise Pereyra, MSN, APRN, FNP-C, CEN [facility]  Perioperative Services Nurse Practitioner Phone: [phone] Fax: [phone] 12/31/23 9:52 AM  NOTE: This note has been prepared using Dragon dictation software. Despite my best ability to proofread, there is always the potential that unintentional transcriptional errors may still occur from this process.

## 2023-12-31 ENCOUNTER — Encounter: Payer: Self-pay | Admitting: Surgery

## 2024-01-01 ENCOUNTER — Ambulatory Visit: Payer: Self-pay | Admitting: Certified Registered"

## 2024-01-01 ENCOUNTER — Encounter: Payer: Self-pay | Admitting: Surgery

## 2024-01-01 ENCOUNTER — Encounter: Admission: RE | Disposition: A | Payer: Self-pay | Source: Home / Self Care | Attending: Surgery

## 2024-01-01 ENCOUNTER — Ambulatory Visit
Admission: RE | Admit: 2024-01-01 | Discharge: 2024-01-01 | Disposition: A | Discharge status: Home / Self Care | Attending: Surgery | Admitting: Surgery

## 2024-01-01 ENCOUNTER — Other Ambulatory Visit: Payer: Self-pay

## 2024-01-01 DIAGNOSIS — Z955 Presence of coronary angioplasty implant and graft: Secondary | ICD-10-CM | POA: Insufficient documentation

## 2024-01-01 DIAGNOSIS — Z951 Presence of aortocoronary bypass graft: Secondary | ICD-10-CM | POA: Diagnosis not present

## 2024-01-01 DIAGNOSIS — E1122 Type 2 diabetes mellitus with diabetic chronic kidney disease: Secondary | ICD-10-CM | POA: Diagnosis not present

## 2024-01-01 DIAGNOSIS — Z87891 Personal history of nicotine dependence: Secondary | ICD-10-CM | POA: Diagnosis not present

## 2024-01-01 DIAGNOSIS — I2582 Chronic total occlusion of coronary artery: Secondary | ICD-10-CM | POA: Diagnosis not present

## 2024-01-01 DIAGNOSIS — Z7984 Long term (current) use of oral hypoglycemic drugs: Secondary | ICD-10-CM | POA: Insufficient documentation

## 2024-01-01 DIAGNOSIS — Z7902 Long term (current) use of antithrombotics/antiplatelets: Secondary | ICD-10-CM | POA: Diagnosis not present

## 2024-01-01 DIAGNOSIS — M1990 Unspecified osteoarthritis, unspecified site: Secondary | ICD-10-CM | POA: Diagnosis not present

## 2024-01-01 DIAGNOSIS — E785 Hyperlipidemia, unspecified: Secondary | ICD-10-CM | POA: Insufficient documentation

## 2024-01-01 DIAGNOSIS — I2510 Atherosclerotic heart disease of native coronary artery without angina pectoris: Secondary | ICD-10-CM | POA: Diagnosis not present

## 2024-01-01 DIAGNOSIS — L720 Epidermal cyst: Secondary | ICD-10-CM | POA: Diagnosis not present

## 2024-01-01 DIAGNOSIS — I129 Hypertensive chronic kidney disease with stage 1 through stage 4 chronic kidney disease, or unspecified chronic kidney disease: Secondary | ICD-10-CM | POA: Insufficient documentation

## 2024-01-01 DIAGNOSIS — Z981 Arthrodesis status: Secondary | ICD-10-CM | POA: Insufficient documentation

## 2024-01-01 DIAGNOSIS — Z7901 Long term (current) use of anticoagulants: Secondary | ICD-10-CM | POA: Diagnosis not present

## 2024-01-01 DIAGNOSIS — N182 Chronic kidney disease, stage 2 (mild): Secondary | ICD-10-CM | POA: Insufficient documentation

## 2024-01-01 DIAGNOSIS — Z79899 Other long term (current) drug therapy: Secondary | ICD-10-CM | POA: Insufficient documentation

## 2024-01-01 DIAGNOSIS — I4891 Unspecified atrial fibrillation: Secondary | ICD-10-CM | POA: Insufficient documentation

## 2024-01-01 DIAGNOSIS — I252 Old myocardial infarction: Secondary | ICD-10-CM | POA: Diagnosis not present

## 2024-01-01 DIAGNOSIS — K635 Polyp of colon: Secondary | ICD-10-CM

## 2024-01-01 DIAGNOSIS — Z8673 Personal history of transient ischemic attack (TIA), and cerebral infarction without residual deficits: Secondary | ICD-10-CM | POA: Insufficient documentation

## 2024-01-01 DIAGNOSIS — E1149 Type 2 diabetes mellitus with other diabetic neurological complication: Secondary | ICD-10-CM

## 2024-01-01 HISTORY — PX: CYST REMOVAL NECK: SHX6281

## 2024-01-01 HISTORY — DX: Unspecified hemorrhoids: K64.9

## 2024-01-01 HISTORY — DX: Gastro-esophageal reflux disease without esophagitis: K21.9

## 2024-01-01 HISTORY — DX: Transient cerebral ischemic attack, unspecified: G45.9

## 2024-01-01 HISTORY — DX: Other forms of angina pectoris: I20.89

## 2024-01-01 HISTORY — DX: Other long term (current) drug therapy: Z79.899

## 2024-01-01 HISTORY — DX: Obstructive sleep apnea (adult) (pediatric): G47.33

## 2024-01-01 HISTORY — DX: Other cervical disc degeneration, unspecified cervical region: M50.30

## 2024-01-01 HISTORY — DX: Vitamin D deficiency, unspecified: E55.9

## 2024-01-01 HISTORY — DX: Long term (current) use of antithrombotics/antiplatelets: Z79.02

## 2024-01-01 HISTORY — DX: Long term (current) use of anticoagulants: Z79.01

## 2024-01-01 HISTORY — DX: Unspecified right bundle-branch block: I45.10

## 2024-01-01 HISTORY — DX: Congenital diaphragmatic hernia: Q79.0

## 2024-01-01 HISTORY — DX: Type 2 diabetes mellitus with hyperglycemia: E11.65

## 2024-01-01 HISTORY — DX: Unspecified atrial fibrillation: I48.91

## 2024-01-01 HISTORY — DX: Polyp of colon: K63.5

## 2024-01-01 HISTORY — DX: Atherosclerosis of aorta: I70.0

## 2024-01-01 LAB — GLUCOSE, CAPILLARY
Glucose-Capillary: 104 mg/dL — ABNORMAL HIGH (ref 70–99)
Glucose-Capillary: 111 mg/dL — ABNORMAL HIGH (ref 70–99)

## 2024-01-01 SURGERY — EXCISION, CYST, NECK
Anesthesia: General | Laterality: Left

## 2024-01-01 MED ORDER — 0.9 % SODIUM CHLORIDE (POUR BTL) OPTIME
TOPICAL | Status: DC | PRN
  Administered 2024-01-01: 100 mL

## 2024-01-01 MED ORDER — PHENYLEPHRINE 80 MCG/ML (10ML) SYRINGE FOR IV PUSH (FOR BLOOD PRESSURE SUPPORT)
PREFILLED_SYRINGE | INTRAVENOUS | Status: DC | PRN
  Administered 2024-01-01: 160 ug via INTRAVENOUS
  Administered 2024-01-01: 80 ug via INTRAVENOUS

## 2024-01-01 MED ORDER — EPHEDRINE 5 MG/ML INJ
INTRAVENOUS | Status: AC
  Filled 2024-01-01: qty 5

## 2024-01-01 MED ORDER — MIDAZOLAM HCL 2 MG/2ML IJ SOLN
INTRAMUSCULAR | Status: DC | PRN
  Administered 2024-01-01: 2 mg via INTRAVENOUS

## 2024-01-01 MED ORDER — ONDANSETRON HCL 4 MG/2ML IJ SOLN
INTRAMUSCULAR | Status: DC | PRN
  Administered 2024-01-01: 4 mg via INTRAVENOUS

## 2024-01-01 MED ORDER — ORAL CARE MOUTH RINSE: 15.0000 mL | Freq: Once | OROMUCOSAL | Status: DC

## 2024-01-01 MED ORDER — CHLORHEXIDINE GLUCONATE CLOTH 2 % EX PADS: 6.0000 | MEDICATED_PAD | Freq: Once | CUTANEOUS | Status: DC

## 2024-01-01 MED ORDER — LACTATED RINGERS IV SOLN: INTRAVENOUS | Status: DC | PRN

## 2024-01-01 MED ORDER — HYDROMORPHONE HCL 1 MG/ML IJ SOLN
INTRAMUSCULAR | Status: AC
  Filled 2024-01-01: qty 1

## 2024-01-01 MED ORDER — OXYCODONE HCL 5 MG PO TABS
5.0000 mg | ORAL_TABLET | Freq: Once | ORAL | Status: AC
  Administered 2024-01-01: 5 mg via ORAL

## 2024-01-01 MED ORDER — LIDOCAINE HCL (CARDIAC) PF 100 MG/5ML IV SOSY
PREFILLED_SYRINGE | INTRAVENOUS | Status: DC | PRN
  Administered 2024-01-01: 100 mg via INTRAVENOUS

## 2024-01-01 MED ORDER — FENTANYL CITRATE (PF) 100 MCG/2ML IJ SOLN: 25.0000 ug | INTRAMUSCULAR | Status: DC | PRN

## 2024-01-01 MED ORDER — GABAPENTIN 300 MG PO CAPS
ORAL_CAPSULE | ORAL | Status: AC
  Filled 2024-01-01: qty 1

## 2024-01-01 MED ORDER — FENTANYL CITRATE (PF) 100 MCG/2ML IJ SOLN
INTRAMUSCULAR | Status: AC
  Filled 2024-01-01: qty 2

## 2024-01-01 MED ORDER — ONDANSETRON HCL 4 MG/2ML IJ SOLN
INTRAMUSCULAR | Status: AC
  Filled 2024-01-01: qty 2

## 2024-01-01 MED ORDER — SEVOFLURANE IN SOLN
RESPIRATORY_TRACT | Status: AC
  Filled 2024-01-01: qty 250

## 2024-01-01 MED ORDER — CHLORHEXIDINE GLUCONATE 0.12 % MT SOLN: 15.0000 mL | Freq: Once | OROMUCOSAL | Status: DC

## 2024-01-01 MED ORDER — SODIUM CHLORIDE 0.9 % IV SOLN
INTRAVENOUS | Status: DC
Start: 2024-01-01 — End: 2024-01-01

## 2024-01-01 MED ORDER — BUPIVACAINE HCL (PF) 0.5 % IJ SOLN
INTRAMUSCULAR | Status: DC | PRN
  Administered 2024-01-01: 30 mL

## 2024-01-01 MED ORDER — DEXAMETHASONE SODIUM PHOSPHATE 10 MG/ML IJ SOLN
INTRAMUSCULAR | Status: AC
  Filled 2024-01-01: qty 1

## 2024-01-01 MED ORDER — ACETAMINOPHEN 500 MG PO TABS
ORAL_TABLET | ORAL | Status: AC
Start: 2024-01-01 — End: 2024-01-01
  Filled 2024-01-01: qty 2

## 2024-01-01 MED ORDER — OXYCODONE HCL 5 MG PO TABS: 5.0000 mg | ORAL_TABLET | ORAL | 0 refills | Status: DC | PRN

## 2024-01-01 MED ORDER — PROPOFOL 10 MG/ML IV BOLUS
INTRAVENOUS | Status: AC
  Filled 2024-01-01: qty 20

## 2024-01-01 MED ORDER — MIDAZOLAM HCL 2 MG/2ML IJ SOLN
INTRAMUSCULAR | Status: AC
  Filled 2024-01-01: qty 2

## 2024-01-01 MED ORDER — OXYCODONE HCL 5 MG PO TABS
ORAL_TABLET | ORAL | Status: AC
  Filled 2024-01-01: qty 1

## 2024-01-01 MED ORDER — ACETAMINOPHEN 10 MG/ML IV SOLN
INTRAVENOUS | Status: AC
  Filled 2024-01-01: qty 100

## 2024-01-01 MED ORDER — EPHEDRINE SULFATE-NACL 50-0.9 MG/10ML-% IV SOSY
PREFILLED_SYRINGE | INTRAVENOUS | Status: DC | PRN
  Administered 2024-01-01: 10 mg via INTRAVENOUS
  Administered 2024-01-01 (×2): 5 mg via INTRAVENOUS

## 2024-01-01 MED ORDER — GABAPENTIN 300 MG PO CAPS
300.0000 mg | ORAL_CAPSULE | ORAL | Status: AC
  Administered 2024-01-01: 300 mg via ORAL

## 2024-01-01 MED ORDER — CEFAZOLIN SODIUM-DEXTROSE 2-4 GM/100ML-% IV SOLN
INTRAVENOUS | Status: AC
Start: 2024-01-01 — End: 2024-01-01
  Filled 2024-01-01: qty 100

## 2024-01-01 MED ORDER — DEXAMETHASONE SODIUM PHOSPHATE 10 MG/ML IJ SOLN
INTRAMUSCULAR | Status: DC | PRN
  Administered 2024-01-01: 10 mg via INTRAVENOUS

## 2024-01-01 MED ORDER — ROCURONIUM BROMIDE 100 MG/10ML IV SOLN
INTRAVENOUS | Status: DC | PRN
  Administered 2024-01-01: 10 mg via INTRAVENOUS

## 2024-01-01 MED ORDER — LIDOCAINE HCL (PF) 2 % IJ SOLN
INTRAMUSCULAR | Status: AC
  Filled 2024-01-01: qty 5

## 2024-01-01 MED ORDER — ACETAMINOPHEN 500 MG PO TABS: 1000.0000 mg | ORAL_TABLET | Freq: Four times a day (QID) | ORAL | Status: AC | PRN

## 2024-01-01 MED ORDER — ROCURONIUM BROMIDE 10 MG/ML (PF) SYRINGE
PREFILLED_SYRINGE | INTRAVENOUS | Status: AC
  Filled 2024-01-01: qty 10

## 2024-01-01 MED ORDER — PHENYLEPHRINE 80 MCG/ML (10ML) SYRINGE FOR IV PUSH (FOR BLOOD PRESSURE SUPPORT)
PREFILLED_SYRINGE | INTRAVENOUS | Status: AC
  Filled 2024-01-01: qty 10

## 2024-01-01 MED ORDER — CEFAZOLIN SODIUM-DEXTROSE 2-4 GM/100ML-% IV SOLN
2.0000 g | INTRAVENOUS | Status: AC
  Administered 2024-01-01: 2 g via INTRAVENOUS

## 2024-01-01 MED ORDER — FENTANYL CITRATE (PF) 100 MCG/2ML IJ SOLN
INTRAMUSCULAR | Status: DC | PRN
  Administered 2024-01-01: 100 ug via INTRAVENOUS
  Administered 2024-01-01: 25 ug via INTRAVENOUS

## 2024-01-01 MED ORDER — SUCCINYLCHOLINE CHLORIDE 200 MG/10ML IV SOSY
PREFILLED_SYRINGE | INTRAVENOUS | Status: AC
  Filled 2024-01-01: qty 10

## 2024-01-01 MED ORDER — SUCCINYLCHOLINE CHLORIDE 200 MG/10ML IV SOSY
PREFILLED_SYRINGE | INTRAVENOUS | Status: DC | PRN
  Administered 2024-01-01: 120 mg via INTRAVENOUS

## 2024-01-01 MED ORDER — BUPIVACAINE HCL (PF) 0.5 % IJ SOLN
INTRAMUSCULAR | Status: AC
  Filled 2024-01-01: qty 30

## 2024-01-01 MED ORDER — ACETAMINOPHEN 500 MG PO TABS
1000.0000 mg | ORAL_TABLET | ORAL | Status: AC
  Administered 2024-01-01: 1000 mg via ORAL

## 2024-01-01 SURGICAL SUPPLY — 24 items
CHLORAPREP W/TINT 26 (MISCELLANEOUS) IMPLANT
DERMABOND ADVANCED .7 DNX12 (GAUZE/BANDAGES/DRESSINGS) ×1 IMPLANT
DRAPE LAPAROTOMY 100X77 ABD (DRAPES) ×1 IMPLANT
DRAPE SHEET LG 3/4 BI-LAMINATE (DRAPES) ×2 IMPLANT
ELECTRODE CAUTERY BLDE TIP 2.5 (TIP) ×1 IMPLANT
ELECTRODE REM PT RTRN 9FT ADLT (ELECTROSURGICAL) ×1 IMPLANT
GAUZE 4X4 16PLY ~~LOC~~+RFID DBL (SPONGE) IMPLANT
GLOVE SURG SYN 7.0 PF PI (GLOVE) ×1 IMPLANT
GLOVE SURG SYN 7.5 PF PI (GLOVE) ×1 IMPLANT
GOWN STRL REUS W/ TWL LRG LVL3 (GOWN DISPOSABLE) ×2 IMPLANT
KIT TURNOVER KIT A (KITS) ×1 IMPLANT
LABEL OR SOLS (LABEL) ×1 IMPLANT
MANIFOLD NEPTUNE II (INSTRUMENTS) ×1 IMPLANT
NDL HYPO 22X1.5 SAFETY MO (MISCELLANEOUS) ×1 IMPLANT
NEEDLE HYPO 22X1.5 SAFETY MO (MISCELLANEOUS) ×1 IMPLANT
PACK BASIN MINOR ARMC (MISCELLANEOUS) ×1 IMPLANT
SOLN 0.9% NACL 1000 ML (IV SOLUTION) ×1 IMPLANT
SOLN 0.9% NACL POUR BTL 1000ML (IV SOLUTION) ×1 IMPLANT
SUT VIC AB 0 SH 27 (SUTURE) ×2 IMPLANT
SUT VIC AB 3-0 SH 27X BRD (SUTURE) ×2 IMPLANT
SUTURE MNCRL 4-0 27XMF (SUTURE) ×1 IMPLANT
SYR 30ML LL (SYRINGE) ×1 IMPLANT
TRAP FLUID SMOKE EVACUATOR (MISCELLANEOUS) ×1 IMPLANT
WATER STERILE IRR 500ML POUR (IV SOLUTION) ×1 IMPLANT

## 2024-01-01 NOTE — Anesthesia Preprocedure Evaluation (Addendum)
 Anesthesia Evaluation  Patient identified by MRN, date of birth, ID band Patient awake    Reviewed: Allergy & Precautions, H&P , NPO status , Patient's Chart, lab work & pertinent test results  History of Anesthesia Complications (+) DIFFICULT AIRWAY and history of anesthetic complications (reports told difficult after CABG at [facility]. Glidescope used)  Airway Mallampati: III  TM Distance: >3 FB Neck ROM: full    Dental  (+) Missing, Dental Advidsory Given   Pulmonary neg shortness of breath, neg sleep apnea, neg COPD, neg recent URI, former smoker   Pulmonary exam normal        Cardiovascular Exercise Tolerance: Good hypertension, (-) angina + CAD, + Past MI (2007), + Cardiac Stents and + CABG (2015, 3 vessel)  + dysrhythmias Atrial Fibrillation (-) Valvular Problems/Murmurs Rhythm:Irregular Rate:Normal - Peripheral Edema    Neuro/Psych neg Seizures negative neurological ROS  negative psych ROS   GI/Hepatic negative GI ROS, Neg liver ROS,,,  Endo/Other  diabetes, Type 2    Renal/GU Renal disease (CKD stage 2)  negative genitourinary   Musculoskeletal   Abdominal  (+) + obese  Peds  Hematology negative hematology ROS (+)   Anesthesia Other Findings Past Medical History: No date: Arthritis No date: CAD (coronary artery disease) No date: Carpal tunnel syndrome No date: Diabetes mellitus without complication (HCC) 09/24/2013: Difficult intubation     Comment:  reports told difficult after CABG at [facility].  Glidescope               used No date: Dyspnea     Comment:  walking a mile No date: Family history of anesthesia complication     Comment:  mother had N/V after valve replacement No date: Fusion of spine of cervical region No date: History of kidney stones No date: Hyperlipidemia No date: Hypertension 2007: Myocardial infarct (HCC) No date: Obesity No date: Wears contact lenses  Past Surgical  History: 10/22/2013: APPLICATION OF WOUND VAC; Right     Comment:  Procedure: APPLICATION OF WOUND VAC;  Surgeon: Dallas KATHEE Jude, MD;  Location: [facility] OR;  Service: Vascular;                Laterality: Right; No date: C-Spine disc replacement x 2     Comment:  06/16/2012 No date: CARDIAC CATHETERIZATION     Comment:  last week @ [facility] No date: cardiac stents     Comment:  has been cathed x 4   Had  MI  in 2007 No date: CARPAL TUNNEL RELEASE     Comment:  on right 04/26/2016: COLONOSCOPY WITH PROPOFOL ; N/A     Comment:  Procedure: COLONOSCOPY WITH PROPOFOL ;  Surgeon: Rogelia Copping, MD;  Location: [facility];  Service:               Endoscopy;  Laterality: N/A; 09/24/2013: CORONARY ARTERY BYPASS GRAFT; N/A     Comment:  Procedure: CORONARY ARTERY BYPASS GRAFTING (CABG) x               three, using LIMA to LAD, and right leg greater saphenous              vein harvested endoscopically - SVG to Ramus, SVG to PL;               Surgeon: Maude Fleeta Ochoa, MD;  Location: [facility] OR;  Service:              Open Heart Surgery;  Laterality: N/A; 05/14/2018: HEMORRHOID SURGERY; N/A     Comment:  Procedure: HEMORRHOIDECTOMY;  Surgeon: Tye Millet, DO;              Location: [facility] ORS;  Service: General;  Laterality: N/A; 10/22/2013: I & D EXTREMITY; Right     Comment:  Procedure: IRRIGATION AND DEBRIDEMENT EXTREMITY-RIGHT               LEG;  Surgeon: Dallas KATHEE Jude, MD;  Location: [facility] OR;                Service: Vascular;  Laterality: Right; 09/24/2013: INTRAOPERATIVE TRANSESOPHAGEAL ECHOCARDIOGRAM; N/A     Comment:  Procedure: INTRAOPERATIVE TRANSESOPHAGEAL               ECHOCARDIOGRAM;  Surgeon: Maude Fleeta Ochoa, MD;  Location:              [facility] OR;  Service: Open Heart Surgery;  Laterality: N/A; 05/01/2023: LEFT HEART CATH AND CORONARY ANGIOGRAPHY; Left     Comment:  Procedure: LEFT HEART CATH AND CORONARY ANGIOGRAPHY;                Surgeon: Florencio Cara BIRCH, MD;   Location: [facility] INVASIVE              CV LAB;  Service: Cardiovascular;  Laterality: Left; 04/26/2016: POLYPECTOMY     Comment:  Procedure: POLYPECTOMY;  Surgeon: Rogelia Copping, MD;                Location: [facility];  Service: Endoscopy;; No date: PTCA at [facility]     Comment:  04/28/2005 No date: SPINAL FUSION     Comment:  vertebra, congentital : Dr. Louis  No date: TONSILLECTOMY     Reproductive/Obstetrics negative OB ROS                              Anesthesia Physical Anesthesia Plan  ASA: 3  Anesthesia Plan: General   Post-op Pain Management:    Induction: Intravenous  PONV Risk Score and Plan: Propofol  infusion and TIVA  Airway Management Planned: Oral ETT  Additional Equipment:   Intra-op Plan:   Post-operative Plan: Extubation in OR  Informed Consent: I have reviewed the patients History and Physical, chart, labs and discussed the procedure including the risks, benefits and alternatives for the proposed anesthesia with the patient or authorized representative who has indicated his/her understanding and acceptance.     Dental Advisory Given  Plan Discussed with: CRNA and Surgeon  Anesthesia Plan Comments: (Backup GA with LMA/ETT)         Anesthesia Quick Evaluation

## 2024-01-01 NOTE — Transfer of Care (Signed)
 Immediate Anesthesia Transfer of Care Note  Patient: Nathan Hull  Procedure(s) Performed: EXCISION, CYST, NECK (Left)  Patient Location: PACU  Anesthesia Type:General  Level of Consciousness: awake and responds to stimulation  Airway & Oxygen Therapy: Patient Spontanous Breathing and Patient connected to face mask oxygen  Post-op Assessment: Report given to RN and Post -op Vital signs reviewed and stable  Post vital signs: stable  Last Vitals:  Vitals Value Taken Time  BP 125/72 01/01/24 15:30  Temp    Pulse 76 01/01/24 15:32  Resp 15 01/01/24 15:32  SpO2 98 % 01/01/24 15:32  Vitals shown include unfiled device data.  Last Pain:  Vitals:   01/01/24 1254  PainSc: 0-No pain         Complications: No notable events documented.

## 2024-01-01 NOTE — H&P (Signed)
 Reason for Visit:  Posterior neck cyst   Requesting Provider:  Lauraine Buoy, DO   History of Present Illness: Nathan Hull is a 63 y.o. male presenting for evaluation of posterior neck cyst.  The patient reports that this has been present for many years.  In the past he has had this area either lanced or excised twice before.  He reports that once it was done in our office several years ago and also in [place] but he cannot remember which place was not first.  He is also not sure if the cyst was fully excised or only lanced.  Nonetheless, he reports that after the last procedure, he has continued to increase in size and now has become more bothersome.  Denies any specific range of motion limitations due to the cyst but his main complaint is the discomfort and bothersome nature of the cyst.  Has not had any drainage from this area recently and has not noticed any erythema or induration either.   Of note, the patient has significant cardiac history with a history of MI status post stents and CABG in the past, currently on Plavix , atrial fibrillation and is currently on Eliquis , hyperlipidemia, hypertension, diabetes.   Past Medical History:     Past Medical History:  Diagnosis Date   Arthritis     CAD (coronary artery disease)     Carpal tunnel syndrome     Diabetes mellitus without complication (HCC)     Difficult intubation 09/24/2013    reports told difficult after CABG at [facility].  Glidescope used   Dyspnea      walking a mile   Family history of anesthesia complication      mother had N/V after valve replacement   Fusion of spine of cervical region     History of kidney stones     Hyperlipidemia     Hypertension     Myocardial infarct [facility]) 2007   Obesity     Wears contact lenses            Past Surgical History:      Past Surgical History:  Procedure Laterality Date   APPLICATION OF WOUND VAC Right 10/22/2013    Procedure: APPLICATION OF WOUND VAC;  Surgeon: Dallas KATHEE Jude, MD;  Location: [facility] OR;  Service: Vascular;  Laterality: Right;   C-Spine disc replacement x 2        06/16/2012   CARDIAC CATHETERIZATION        last week @ [facility]   cardiac stents        has been cathed x 4   Had  MI  in 2007   CARDIOVERSION N/A 05/15/2023    Procedure: CARDIOVERSION;  Surgeon: Dewane Shiner, DO;  Location: [facility] ORS;  Service: Cardiovascular;  Laterality: N/A;   CARPAL TUNNEL RELEASE        on right   COLONOSCOPY WITH PROPOFOL  N/A 04/26/2016    Procedure: COLONOSCOPY WITH PROPOFOL ;  Surgeon: Rogelia Copping, MD;  Location: [facility];  Service: Endoscopy;  Laterality: N/A;   CORONARY ARTERY BYPASS GRAFT N/A 09/24/2013    Procedure: CORONARY ARTERY BYPASS GRAFTING (CABG) x three, using LIMA to LAD, and right leg greater saphenous vein harvested endoscopically - SVG to Ramus, SVG to PL;  Surgeon: Maude Fleeta Ochoa, MD;  Location: [facility] OR;  Service: Open Heart Surgery;  Laterality: N/A;   HEMORRHOID SURGERY N/A 05/14/2018    Procedure: HEMORRHOIDECTOMY;  Surgeon: Tye Millet, DO;  Location: [facility] ORS;  Service: General;  Laterality: N/A;   I & D EXTREMITY Right 10/22/2013    Procedure: IRRIGATION AND DEBRIDEMENT EXTREMITY-RIGHT LEG;  Surgeon: Dallas KATHEE Jude, MD;  Location: [facility] OR;  Service: Vascular;  Laterality: Right;   INTRAOPERATIVE TRANSESOPHAGEAL ECHOCARDIOGRAM N/A 09/24/2013    Procedure: INTRAOPERATIVE TRANSESOPHAGEAL ECHOCARDIOGRAM;  Surgeon: Maude Fleeta Ochoa, MD;  Location: [facility] OR;  Service: Open Heart Surgery;  Laterality: N/A;   LEFT HEART CATH AND CORONARY ANGIOGRAPHY Left 05/01/2023    Procedure: LEFT HEART CATH AND CORONARY ANGIOGRAPHY;  Surgeon: Florencio Cara BIRCH, MD;  Location: [facility] INVASIVE CV LAB;  Service: Cardiovascular;  Laterality: Left;   POLYPECTOMY   04/26/2016    Procedure: POLYPECTOMY;  Surgeon: Rogelia Copping, MD;  Location: [facility];  Service: Endoscopy;;   PTCA at [facility]        04/28/2005   SPINAL FUSION        vertebra, congentital :  Dr. Louis    TONSILLECTOMY              Home Medications:        Prior to Admission medications   Medication Sig Start Date End Date Taking? Authorizing Provider  amiodarone  (PACERONE ) 200 MG tablet Take 200 mg by mouth 2 (two) times daily. 09/26/23 09/25/24 Yes [provider]  apixaban  (ELIQUIS ) 5 MG TABS tablet Take 1 tablet (5 mg total) by mouth 2 (two) times daily. 05/01/23 11/24/23 Yes Hudson, Caralyn, PA-C  Blood Glucose Monitoring Suppl (ONE TOUCH ULTRA MINI) w/Device KIT USE AS DIRECTED 09/16/16   Yes Chauvin, Lamar, PA  clopidogrel  (PLAVIX ) 75 MG tablet TAKE 1 TABLET BY MOUTH EVERY DAY 04/03/23   Yes Emilio Marseille T, FNP  diltiazem  (CARDIZEM  CD) 120 MG 24 hr capsule Take 120 mg by mouth at bedtime.     Yes [provider]  FARXIGA  10 MG TABS tablet TAKE 1 TABLET BY MOUTH EVERY DAY 04/03/23   Yes Emilio Marseille DASEN, FNP  fenofibrate  (TRICOR ) 145 MG tablet Take 1 tablet (145 mg total) by mouth daily. 11/11/23   Yes Pardue, Lauraine SAILOR, DO  lisinopril  (ZESTRIL ) 40 MG tablet TAKE 1 TABLET BY MOUTH EVERY DAY 04/03/23   Yes Emilio Marseille T, FNP  metFORMIN  (GLUCOPHAGE -XR) 750 MG 24 hr tablet Take 1 tablet (750 mg total) by mouth 2 (two) times daily before lunch and supper. Patient taking differently: Take 750 mg by mouth 2 (two) times daily before lunch and supper. Taking 1500 mg daily 06/02/23   Yes Pardue, Sarah N, DO  Multiple Vitamins-Minerals (MULTIVITAMIN WITH MINERALS) tablet Take 1 tablet by mouth daily.     Yes [provider]  ONE TOUCH ULTRA TEST test strip USE AS DIRECTED 10/28/16   Yes Chauvin, Beren, PA  [facility] DELICA LANCETS FINE MISC See admin instructions. 08/02/16   Yes [provider]  rosuvastatin  (CRESTOR ) 20 MG tablet Take 1 tablet (20 mg total) by mouth daily. 11/11/23   Yes Pardue, Lauraine SAILOR, DO  sodium zirconium cyclosilicate  (LOKELMA ) 10 g PACK packet Take 10 g by mouth 2 (two) times daily for 2 days. 11/24/23 11/26/23 Yes Pardue, Lauraine SAILOR, DO  traMADol   (ULTRAM ) 50 MG tablet Take 1 tablet (50 mg total) by mouth 3 (three) times daily as needed. 11/11/23   Yes Pardue, Lauraine SAILOR, DO  Vitamin D , Ergocalciferol , (DRISDOL ) 1.25 MG (50000 UNIT) CAPS capsule Take 1 capsule (50,000 Units total) by mouth every 7 (seven) days. 09/04/23   Yes Donzella Lauraine SAILOR, DO  Allergies: Allergies  No Known Allergies     Social History:  reports that he quit smoking about 18 years ago. His smoking use included cigarettes. He has never used smokeless tobacco. He reports that he does not currently use alcohol  after a past usage of about 1.0 standard drink of alcohol  per week. He reports that he does not use drugs.    Family History:      Family History  Problem Relation Age of Onset   Hypertension Mother     Rheumatic fever Mother          S/P MVR   Hypertension Father            Review of Systems: Review of Systems  Constitutional:  Negative for chills and fever.  Respiratory:  Negative for shortness of breath.   Cardiovascular:  Negative for chest pain.  Gastrointestinal:  Negative for nausea and vomiting.  Skin:        Posterior left neck cyst     Physical Exam BP 126/70   Pulse 67   Temp 98.3 F (36.8 C) (Oral)   Ht 6' (1.829 m)   Wt 262 lb 9.6 oz (119.1 kg)   SpO2 95%   BMI 35.61 kg/m  CONSTITUTIONAL: No acute distress HEENT:  Normocephalic, atraumatic, extraocular motion intact. RESPIRATORY:  Lungs are clear, and breath sounds are equal bilaterally. Normal respiratory effort without pathologic use of accessory muscles. CARDIOVASCULAR: Heart is regular without murmurs, gallops, or rubs. MUSCULOSKELETAL:  Normal muscle strength and tone in all four extremities.  No peripheral edema or cyanosis. SKIN: The patient has a 7 cm cyst in the posterior left neck.  It is soft, somewhat mobile, non-tender, without evidence of active infection.  There is about 1.5 cm scar from what seems prior I&D, but no larger scar from excision. NEUROLOGIC:  Motor  and sensation is grossly normal.  Cranial nerves are grossly intact. PSYCH:  Alert and oriented to person, place and time. Affect is normal.   Laboratory Analysis: Lab Results Last 24 Hours  No results found for this or any previous visit (from the past 24 hours).     Imaging: No results found.   Assessment and Plan: This is a 63 y.o. male with a posterior left neck cyst.   - Discussed with patient that the mass on his left neck is most likely a sebaceous cyst or epidermal inclusion cyst.  The incision scar that is present overlying the cyst is relatively small that I do not think the cyst was ever fully excised unless initially it was that small to begin with.  However, whether it is truly a recurrent or new cyst, it is causing symptoms due to the size.  Discussed with him that also given the size and his medical history, it would be more appropriate to excise this in the operating room under anesthesia.  He is in agreement. - He is scheduled to have cataract surgery next week and as such, ideally we should wait longer before doing his cyst excision.  Will aim for surgery on 01/01/2024.  Will also send for cardiology clearance.  Ideally it would be helpful to stop both Plavix  and Eliquis  but will defer to cardiology for final determination.  Discussed with patient the surgery at length including the planned incision, risks of bleeding, infection, injury to surrounding structures, that this would be an outpatient procedure, activity restrictions, pain control, and he is willing to proceed. - All of his questions have been answered.  Nathan Sheree Plant, MD [facility]

## 2024-01-01 NOTE — Op Note (Addendum)
  Procedure Date:  01/01/2024  Pre-operative Diagnosis:  Posterior left neck mass  Post-operative Diagnosis: 6 cm posterior left neck cyst  Procedure:  Excision of posterior left neck cyst  Surgeon:  Aloysius Sheree Plant, MD  Anesthesia:  General endotracheal  Estimated Blood Loss:  5 ml  Specimens:  None  Complications:  None  Indications for Procedure:  This is a 63 y.o. male with diagnosis of a symptomatic posterior left neck mass, likely cyst.  The patient wishes to have this excised. The risks of bleeding, abscess or infection, injury to surrounding structures, and need for further procedures were all discussed with the patient and he was willing to proceed.  Description of Procedure: The patient was correctly identified in the preoperative area and brought into the operating room.  The patient was placed supine with VTE prophylaxis in place.  Appropriate time-outs were performed.  Anesthesia was induced and the patient was intubated.  Appropriate antibiotics were infused.  The patient was then placed in lateral position.  The patient's left neck was prepped and draped in usual sterile fashion.  A 7 cm incision was made over the mass, and cautery was used to dissect through the skin layers to the mass itself.  It was noted to be a cyst and not a lipoma.  Skin flaps were created using cautery as well, and then the cyst was excised using cautery.  It was sent off to pathology.  The cavity was then irrigated and hemostasis was assured with cautery.  Local anesthetic was infused intradermally.  The wound was then closed in multiple layers using 0 vicryl, 3-0 Vicryl and 4-0 Monocryl.  The incision was cleaned and sealed with DermaBond.  The patient was then placed in supine position, emerged from anesthesia, extubated, and brought to the recovery room for further management.    The patient tolerated the procedure well and all counts were correct at the end of the case.   Aloysius Sheree Plant,  MD

## 2024-01-01 NOTE — Anesthesia Procedure Notes (Signed)
 Procedure Name: Intubation Date/Time: 01/01/2024 2:05 PM  Performed by: Trudy Rankin LABOR, CRNAPre-anesthesia Checklist: Patient identified, Patient being monitored, Timeout performed, Emergency Drugs available and Suction available Patient Re-evaluated:Patient Re-evaluated prior to induction Oxygen Delivery Method: Circle System Utilized Preoxygenation: Pre-oxygenation with 100% oxygen Induction Type: IV induction and Rapid sequence Laryngoscope Size: Mac, 3 and McGrath Grade View: Grade I Tube type: Oral Tube size: 7.5 mm Number of attempts: 1 Airway Equipment and Method: Stylet Placement Confirmation: ETT inserted through vocal cords under direct vision, positive ETCO2 and breath sounds checked- equal and bilateral Secured at: 21 cm Tube secured with: Tape Dental Injury: Teeth and Oropharynx as per pre-operative assessment

## 2024-01-01 NOTE — Discharge Instructions (Signed)
 Discharge Instructions: 1.  Patient may shower, but do not scrub wounds heavily and dab dry only. 2.  Do not submerge wounds in pool/tub until fully healed. 3.  Do not apply ointments or hydrogen peroxide to the wounds. 4.  May apply ice packs to the wounds for comfort. 5.  May resume your Plavix  and Eliquis  on 01/03/24. 6.  Avoid twisting or stretching movements with the neck to prevent straining of the incision for two weeks. 7.  Do not drive while taking narcotics for pain control.  Prior to driving, make sure you are able to rotate right and left to look at blindspots without significant pain or discomfort.

## 2024-01-02 ENCOUNTER — Encounter: Payer: Self-pay | Admitting: Surgery

## 2024-01-05 LAB — SURGICAL PATHOLOGY

## 2024-01-12 ENCOUNTER — Encounter: Payer: Self-pay | Admitting: Surgery

## 2024-01-12 ENCOUNTER — Telehealth: Payer: Self-pay

## 2024-01-12 NOTE — Telephone Encounter (Signed)
 Copied from CRM [id number]. Topic: General - Other >> Jan 12, 2024  8:47 AM Kendralyn S wrote: Reason for CRM: inquiring about medication he was suppose to take for potassium and then take labs

## 2024-01-14 ENCOUNTER — Encounter: Payer: Self-pay | Admitting: Physician Assistant

## 2024-01-14 ENCOUNTER — Ambulatory Visit (INDEPENDENT_AMBULATORY_CARE_PROVIDER_SITE_OTHER): Admitting: Physician Assistant

## 2024-01-14 VITALS — BP 128/60 | HR 72 | Ht 72.0 in | Wt 253.0 lb

## 2024-01-14 DIAGNOSIS — L720 Epidermal cyst: Secondary | ICD-10-CM

## 2024-01-14 DIAGNOSIS — Z09 Encounter for follow-up examination after completed treatment for conditions other than malignant neoplasm: Secondary | ICD-10-CM

## 2024-01-14 NOTE — Patient Instructions (Addendum)
 Change your dressing once a day with clean dry gauze. Do this with your shower. Remove your dressing, shower as usual, rinse well, and pat dry and redress.    We will get you scheduled for an excision of the cyst on your right jaw in the office. You do not need a driver but may have someone with you. You will need to stop your Eliquis  2 days before and Plavix  5 days before this procedure. Shave your neck the day before your procedure.   Wound Care, Adult Taking care of your wound properly can help to prevent pain, infection, and scarring. It can also help your wound heal more quickly. Follow instructions from your health care provider about how to care for your wound. Supplies needed: Soap and water . Wound cleanser, saline, or germ-free (sterile) water . Gauze. If needed, a clean bandage (dressing) or other type of wound dressing material to cover or place in the wound. Follow your health care provider's instructions about what dressing supplies to use. Cream or topical ointment to apply to the wound, if told by your health care provider. How to care for your wound Cleaning the wound Ask your health care provider how to clean the wound. This may include: Using mild soap and water , a wound cleanser, saline, or sterile water . Using a clean gauze to pat the wound dry after cleaning it. Do not rub or scrub the wound. Dressing care Wash your hands with soap and water  for at least 20 seconds before and after you change the dressing. If soap and water  are not available, use hand sanitizer. Change your dressing as told by your health care provider. This may include: Cleaning or rinsing out (irrigating) the wound. Application of cream or topical ointment, if told by your health care provider. Placing a dressing over the wound or in the wound (packing). Covering the wound with an outer dressing. Leave stitches (sutures), staples, skin glue, or adhesive strips in place. These skin closures may need to  stay in place for 2 weeks or longer. If adhesive strip edges start to loosen and curl up, you may trim the loose edges. Do not remove adhesive strips completely unless your health care provider tells you to do that. Ask your health care provider when you can leave the wound uncovered. Checking for infection Check your wound area every day for signs of infection. Check for: More redness, swelling, or pain. Fluid or blood. Warmth. Pus or a bad smell.  Follow these instructions at home Medicines If you were prescribed an antibiotic medicine, cream, or ointment, take or apply it as told by your health care provider. Do not stop using the antibiotic even if your condition improves. If you were prescribed pain medicine, take it 30 minutes before you do any wound care or as told by your health care provider. Take over-the-counter and prescription medicines only as told by your health care provider. Eating and drinking Eat a diet that includes protein, vitamin A, vitamin C, and other nutrient-rich foods to help the wound heal. Foods rich in protein include meat, fish, eggs, dairy, beans, and nuts. Foods rich in vitamin A include carrots and dark green, leafy vegetables. Foods rich in vitamin C include citrus fruits, tomatoes, broccoli, and peppers. Drink enough fluid to keep your urine pale yellow. General instructions Do not take baths, swim, or use a hot tub until your health care provider approves. Ask your health care provider if you may take showers. You may only be allowed to  take sponge baths. Do not scratch or pick at the wound. Keep it covered as told by your health care provider. Return to your normal activities as told by your health care provider. Ask your health care provider what activities are safe for you. Protect your wound from the sun when you are outside for the first 6 months, or for as long as told by your health care provider. Cover up the scar area or apply sunscreen that has  an SPF of at least 30. Do not use any products that contain nicotine or tobacco. These products include cigarettes, chewing tobacco, and vaping devices, such as e-cigarettes. If you need help quitting, ask your health care provider. Keep all follow-up visits. This is important. Contact a health care provider if: You received a tetanus shot and you have swelling, severe pain, redness, or bleeding at the injection site. Your pain is not controlled with medicine. You have any of these signs of infection: More redness, swelling, or pain around the wound. Fluid or blood coming from the wound. Warmth coming from the wound. A fever or chills. You are nauseous or you vomit. You are dizzy. You have a new rash or hardness around the wound. Get help right away if: You have a red streak of skin near the area around your wound. Pus or a bad smell coming from the wound. Your wound has been closed with staples, sutures, skin glue, or adhesive strips and it begins to open up and separate. Your wound is bleeding, and the bleeding does not stop with gentle pressure. These symptoms may represent a serious problem that is an emergency. Do not wait to see if the symptoms will go away. Get medical help right away. Call your local emergency services (911 in the [place] Do not drive yourself to the hospital. Summary Always wash your hands with soap and water  for at least 20 seconds before and after changing your dressing. Change your dressing as told by your health care provider. To help with healing, eat foods that are rich in protein, vitamin A, vitamin C, and other nutrients. Check your wound every day for signs of infection. Contact your health care provider if you think that your wound is infected. This information is not intended to replace advice given to you by your health care provider. Make sure you discuss any questions you have with your health care provider. Document Revised: 07/25/2020 Document  Reviewed: 07/25/2020 Elsevier Patient Education  2024 [employer].

## 2024-01-15 NOTE — Telephone Encounter (Signed)
 Called patient to inform him of provider's message, he stated that the potassium medication was never sent in to the pharmacy so he has not took it at all.  Told him I would let the provider know.  Please advise.

## 2024-01-16 NOTE — Progress Notes (Signed)
 [facility] POST-OP OFFICE VISIT  01/16/2024  HPI: Nathan Hull is a 63 y.o. male 13 days s/p excision of left posterior neck cyst with Dr Desiderio  He is overall doing well Area is sore intermittently No reports of drainage He does have scabbing to the lateral portion Additionally, he has a right jaw cyst and is interested in removal of this  Vital signs: BP 128/60   Pulse 72   Ht 6' (1.829 m)   Wt 253 lb (114.8 kg)   SpO2 97%   BMI 34.31 kg/m    Physical Exam: Constitutional: Well appearing male, NAD Skin: Excision site to the left posterior neck is healing well; there is some swelling. Scabbing to the lateral aspect. Additionally with well circumscribe cyst to the right jaw line, no evidence of active infection.   Assessment/Plan: This is a 63 y.o. male 13 days s/p excision of left posterior neck cyst with Dr Desiderio   - Pain control prn  - Reviewed wound care recommendation; Cover left lipoma site with gauze daily and as needed   - Reviewed surgical pathology; EIC  - We will set him up with in office excision of right jaw cyst with Dr Desiderio in a few weeks. He understands to hold his Plavix  for 5 days prior and Eliquis  for 2 days prior to this.   -- Arthea Platt, PA-C [facility] 01/16/2024, 10:15 AM M-F: 7am - 4pm

## 2024-01-17 NOTE — Anesthesia Postprocedure Evaluation (Signed)
 Anesthesia Post Note  Patient: Nathan Hull  Procedure(s) Performed: EXCISION, CYST, NECK (Left)  Patient location during evaluation: PACU Anesthesia Type: General Level of consciousness: awake and alert Pain management: pain level controlled Vital Signs Assessment: post-procedure vital signs reviewed and stable Respiratory status: spontaneous breathing, nonlabored ventilation, respiratory function stable and patient connected to nasal cannula oxygen Cardiovascular status: blood pressure returned to baseline and stable Postop Assessment: no apparent nausea or vomiting Anesthetic complications: no   No notable events documented.   Last Vitals:  Vitals:   01/01/24 1554 01/01/24 1612  BP: 135/72 (!) 142/81  Pulse: 71 76  Resp: 18 17  Temp: 36.7 C (!) 36.3 C  SpO2: 98% 98%    Last Pain:  Vitals:   01/01/24 1612  TempSrc: Temporal  PainSc: 0-No pain                 Prentice Murphy

## 2024-02-16 ENCOUNTER — Encounter: Payer: Self-pay | Admitting: Surgery

## 2024-02-16 ENCOUNTER — Ambulatory Visit (INDEPENDENT_AMBULATORY_CARE_PROVIDER_SITE_OTHER): Admitting: Surgery

## 2024-02-16 VITALS — BP 129/77 | HR 62 | Ht 72.0 in | Wt 257.0 lb

## 2024-02-16 DIAGNOSIS — L723 Sebaceous cyst: Secondary | ICD-10-CM | POA: Diagnosis not present

## 2024-02-16 DIAGNOSIS — L720 Epidermal cyst: Secondary | ICD-10-CM

## 2024-02-16 MED ORDER — OXYCODONE HCL 5 MG PO TABS: 5.0000 mg | ORAL_TABLET | Freq: Four times a day (QID) | ORAL | 0 refills | Status: AC | PRN

## 2024-02-16 NOTE — Patient Instructions (Signed)
 We have removed a Cyst in our office today.  You have sutures under the skin that will dissolve and also dermabond (skin glue) on top of your skin which will come off on it's own in 10-14 days.  You may use Ibuprofen  or Tylenol  as needed for pain control. Use the ice pack 3-4 times a day for the next two days for any achiness.  You may shower 02/17/24. Do not scrub at the area.   Avoid Strenuous activities that will make you sweat during the next 48 hours to avoid the glue coming off prematurely. Avoid activities that will place pressure to this area of the body for 1-2 weeks to avoid re-injury to incision site.  Please see your follow-up appointment provided. We will see you back in office to make sure this area is healed and to review the final pathology. If you have any questions or concerns prior to this appointment, call our office and speak with a nurse.    Excision of Skin Cysts or Lesions Excision of a skin lesion refers to the removal of a section of skin by making small cuts (incisions) in the skin. This procedure may be done to remove a cancerous (malignant) or noncancerous (benign) growth on the skin. It is typically done to treat or prevent cancer or infection. It may also be done to improve cosmetic appearance. The procedure may be done to remove: Cancerous growths, such as basal cell carcinoma, squamous cell carcinoma, or melanoma. Noncancerous growths, such as a cyst or lipoma. Growths, such as moles or skin tags, which may be removed for cosmetic reasons.  Various excision or surgical techniques may be used depending on your condition, the location of the lesion, and your overall health. Tell a health care provider about: Any allergies you have. All medicines you are taking, including vitamins, herbs, eye drops, creams, and over-the-counter medicines. Any problems you or family members have had with anesthetic medicines. Any blood disorders you have. Any surgeries you have  had. Any medical conditions you have. Whether you are pregnant or may be pregnant. What are the risks? Generally, this is a safe procedure. However, problems may occur, including: Bleeding. Infection. Scarring. Recurrence of the cyst, lipoma, or cancer. Changes in skin sensation or appearance, such as discoloration or swelling. Reaction to the anesthetics. Allergic reaction to surgical materials or ointments. Damage to nerves, blood vessels, muscles, or other structures. Continued pain.  What happens before the procedure? Ask your health care provider about: Changing or stopping your regular medicines. This is especially important if you are taking diabetes medicines or blood thinners. Taking medicines such as aspirin  and ibuprofen . These medicines can thin your blood. Do not take these medicines before your procedure if your health care provider instructs you not to. You may be asked to take certain medicines. You may be asked to stop smoking. You may have an exam or testing. What happens during the procedure? To reduce your risk of infection: Your health care team will wash or sanitize their hands. Your skin will be washed with soap. You will be given a medicine to numb the area (local anesthetic). One of the following excision techniques will be performed. At the end of any of these procedures, antibiotic ointment will be applied as needed. Each of the following techniques may vary among health care providers and hospitals. Complete Surgical Excision The area of skin that needs to be removed will be marked with a pen. Using a small scalpel or scissors, the  surgeon will gently cut around and under the lesion until it is completely removed. The lesion will be placed in a fluid and sent to the lab for examination. If necessary, bleeding will be controlled with a device that delivers heat (electrocautery). The edges of the wound may be stitched (sutured) together, and a bandage  (dressing) or surgical glue will be applied. This procedure may be performed to treat a cancerous growth or a noncancerous cyst or lesion.  What happens after the procedure? Return to your normal activities as told by your health care provider. Report any excessive bleeding, spreading redness, or increased pain.

## 2024-02-16 NOTE — Progress Notes (Signed)
  Procedure Date:  02/16/2024  Pre-operative Diagnosis:  Sebaceous cyst of the right jaw line.  Post-operative Diagnosis:  Sebaceous cyst of the right jaw line, 1.8 cm.  Procedure:  Excision of sebaceous cyst of the right jaw line; layered closure of 2.8 cm incision.  Surgeon:  Aloysius Sheree Plant, MD  Anesthesia:  5 ml of 1% lidocaine  with epi  Estimated Blood Loss:  5 ml  Specimens:  sebaceous cyst  Complications:  None  Indications for Procedure:  This is a 63 y.o. male with diagnosis of a symptomatic sebaceous cyst of the right jaw line, which has been increasing in size.  The patient wishes to have this excised. The risks of bleeding, abscess or infection, injury to surrounding structures, and need for further procedures were all discussed with the patient and he was willing to proceed.  Description of Procedure: The patient was correctly identified at bedside.  The patient was placed supine.  Appropriate time-outs were performed.  The patient's right jaw line was prepped and draped in usual sterile fashion.  Local anesthetic was infused intradermally.  An elliptical 2.8 cm incision was made over the cyst, and scalpel was used to dissect down the skin and subcutaneous tissue.  Skin flaps were created sharply, and then the cyst was excised intact.  It measured 1.8 cm.  It was sent off to pathology.  The cavity was then irrigated and hemostasis was assured with manual pressure.  The wound was then closed in two layers using 3-0 Vicryl and 4-0 Monocryl.  The incision was cleaned and sealed with DermaBond.  The patient tolerated the procedure well and all sharps were appropriately disposed of at the end of the case.   --Patient may shower tomorrow. --Patient may take Tylenol  for pain control.  Will also give Rx for Oxycodone  as a precaution. --May resume Plavix  on 02/17/24, and Eliquis  on 02/18/24. --Follow up in 10 days.   Aloysius Sheree Plant, MD

## 2024-02-18 LAB — SURGICAL PATHOLOGY

## 2024-02-25 ENCOUNTER — Encounter: Payer: Self-pay | Admitting: Surgery

## 2024-02-25 ENCOUNTER — Ambulatory Visit (INDEPENDENT_AMBULATORY_CARE_PROVIDER_SITE_OTHER): Admitting: Surgery

## 2024-02-25 ENCOUNTER — Encounter: Admitting: Surgery

## 2024-02-25 VITALS — BP 89/57 | HR 67 | Ht 72.0 in | Wt 262.0 lb

## 2024-02-25 DIAGNOSIS — Z09 Encounter for follow-up examination after completed treatment for conditions other than malignant neoplasm: Secondary | ICD-10-CM

## 2024-02-25 DIAGNOSIS — L720 Epidermal cyst: Secondary | ICD-10-CM

## 2024-02-25 DIAGNOSIS — L723 Sebaceous cyst: Secondary | ICD-10-CM

## 2024-02-25 NOTE — Patient Instructions (Signed)
   Follow-up with our office as needed.  Please call and ask to speak with a nurse if you develop questions or concerns.

## 2024-02-25 NOTE — Progress Notes (Signed)
 02/25/2024  HPI: LLIAM HOH is a 63 y.o. male s/p excision of right jawline sebaceous cyst on 02/16/2024.  Patient presents today for follow-up.  He reports that he has been doing well and has no concerns or issues.  The glue fell off already and has not had any troubles with the incision.  Vital signs: BP (!) 89/57   Pulse 67   Ht 6' (1.829 m)   Wt 262 lb (118.8 kg)   SpO2 98%   BMI 35.53 kg/m    Physical Exam: Constitutional: No acute distress Skin: Right jawline incision is healing well and is clean, dry, intact.  Dermabond has peeled off already.  No evidence of infection.  Prior left posterior neck incision is also healing well.  Assessment/Plan: This is a 63 y.o. male s/p excision of right jawline sebaceous cyst.  - The patient's wound is healing very well and has not had any troubles.  The left posterior neck incision from his sebaceous cyst excision on 01/01/2024 is also healing well without complications. - Discussed with patient the pathology report showing this to be an epidermoid cyst which is a benign cyst.  It was fully removed showed this should not be coming back in the future. - Follow-up as needed.   Aloysius Sheree Plant, MD [facility]

## 2024-02-29 ENCOUNTER — Other Ambulatory Visit: Payer: Self-pay | Admitting: Family Medicine

## 2024-02-29 DIAGNOSIS — E1149 Type 2 diabetes mellitus with other diabetic neurological complication: Secondary | ICD-10-CM

## 2024-03-15 ENCOUNTER — Other Ambulatory Visit: Payer: Self-pay | Admitting: Family Medicine

## 2024-03-15 DIAGNOSIS — M542 Cervicalgia: Secondary | ICD-10-CM

## 2024-03-15 DIAGNOSIS — Z981 Arthrodesis status: Secondary | ICD-10-CM

## 2024-03-22 ENCOUNTER — Other Ambulatory Visit: Payer: Self-pay | Admitting: Family Medicine

## 2024-03-22 DIAGNOSIS — Z981 Arthrodesis status: Secondary | ICD-10-CM

## 2024-03-22 DIAGNOSIS — M542 Cervicalgia: Secondary | ICD-10-CM

## 2024-03-22 NOTE — Telephone Encounter (Signed)
 Copied from CRM [id number]. Topic: Clinical - Medication Refill >> Mar 22, 2024 12:29 PM Zebedee SAUNDERS wrote: Medication: traMADol  (ULTRAM ) 50 MG tablet  Has the patient contacted their pharmacy? Yes (Agent: If no, request that the patient contact the pharmacy for the refill. If patient does not wish to contact the pharmacy document the reason why and proceed with request.) (Agent: If yes, when and what did the pharmacy advise?)Pharmacy need script  This is the patient's preferred pharmacy:  [id number] - [place], [place] - [address] [place] [address] Phone: [phone] Fax: [phone]  Is this the correct pharmacy for this prescription? Yes If no, delete pharmacy and type the correct one.   Has the prescription been filled recently? Yes  Is the patient out of the medication? Yes  Has the patient been seen for an appointment in the last year OR does the patient have an upcoming appointment? Yes  Can we respond through MyChart? Yes  Agent: Please be advised that Rx refills may take up to 3 business days. We ask that you follow-up with your pharmacy.

## 2024-03-24 NOTE — Telephone Encounter (Signed)
 Requested medication (s) are due for refill today - no  Requested medication (s) are on the active medication list -yes  Future visit scheduled -yes  Last refill: 03/22/24 #90 1RF  Notes to clinic: Duplicate request, non delegated Rx  Requested Prescriptions  Pending Prescriptions Disp Refills   traMADol  (ULTRAM ) 50 MG tablet 90 tablet 1    Sig: Take 1 tablet (50 mg total) by mouth 3 (three) times daily as needed.     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

## 2024-04-10 ENCOUNTER — Other Ambulatory Visit: Payer: Self-pay | Admitting: Family Medicine

## 2024-04-10 DIAGNOSIS — E1169 Type 2 diabetes mellitus with other specified complication: Secondary | ICD-10-CM

## 2024-04-12 NOTE — Telephone Encounter (Signed)
 Requested Prescriptions  Pending Prescriptions Disp Refills   fenofibrate  (TRICOR ) 145 MG tablet [Pharmacy Med Name: FENOFIBRATE  145 MG TABLET] 90 tablet 0    Sig: Take 1 tablet (145 mg total) by mouth every evening.     Cardiovascular:  Antilipid - Fibric Acid Derivatives Failed - 04/12/2024  3:29 PM      Failed - Cr in normal range and within 360 days    Creatinine, Ser  Date Value Ref Range Status  11/11/2023 1.33 (H) 0.76 - 1.27 mg/dL Final         Failed - HGB in normal range and within 360 days    Hemoglobin  Date Value Ref Range Status  03/12/2023 14.9 13.0 - 17.7 g/dL Final         Failed - HCT in normal range and within 360 days    Hematocrit  Date Value Ref Range Status  03/12/2023 44.8 37.5 - 51.0 % Final         Failed - PLT in normal range and within 360 days    Platelets  Date Value Ref Range Status  03/12/2023 258 150 - 450 x10E3/uL Final         Failed - WBC in normal range and within 360 days    WBC  Date Value Ref Range Status  03/12/2023 8.6 3.4 - 10.8 x10E3/uL Final  12/27/2020 7.3 4.0 - 10.5 K/uL Final         Failed - Lipid Panel in normal range within the last 12 months    Cholesterol, Total  Date Value Ref Range Status  11/11/2023 148 100 - 199 mg/dL Final   LDL Chol Calc (NIH)  Date Value Ref Range Status  11/11/2023 86 0 - 99 mg/dL Final   HDL  Date Value Ref Range Status  11/11/2023 35 (L) >39 mg/dL Final   Triglycerides  Date Value Ref Range Status  11/11/2023 154 (H) 0 - 149 mg/dL Final         Passed - ALT in normal range and within 360 days    ALT  Date Value Ref Range Status  11/11/2023 20 0 - 44 IU/L Final         Passed - AST in normal range and within 360 days    AST  Date Value Ref Range Status  11/11/2023 22 0 - 40 IU/L Final         Passed - eGFR is 30 or above and within 360 days    GFR calc Af Amer  Date Value Ref Range Status  02/04/2020 110 >59 mL/min/1.73 Final    Comment:    **In accordance with  recommendations from the NKF-ASN Task force,**   [facility] is in the process of updating its eGFR calculation to the   2021 CKD-EPI creatinine equation that estimates kidney function   without a race variable.    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 [place], Lauraine SAILOR, DO

## 2024-04-20 ENCOUNTER — Ambulatory Visit: Admitting: Family Medicine

## 2024-05-07 ENCOUNTER — Other Ambulatory Visit: Payer: Self-pay | Admitting: Family Medicine

## 2024-05-07 DIAGNOSIS — E782 Mixed hyperlipidemia: Secondary | ICD-10-CM

## 2024-05-10 ENCOUNTER — Ambulatory Visit: Admitting: Family Medicine

## 2024-05-13 ENCOUNTER — Ambulatory Visit: Admitting: Family Medicine
